# Patient Record
Sex: Male | Born: 1935 | Race: White | Hispanic: No | Marital: Single | Smoking: Former smoker
Health system: Southern US, Community
[De-identification: ages and names within clinical notes are randomized; demographics above are authoritative.]

## PROBLEM LIST (undated history)

## (undated) DIAGNOSIS — C61 Malignant neoplasm of prostate: Secondary | ICD-10-CM

## (undated) DIAGNOSIS — N289 Disorder of kidney and ureter, unspecified: Secondary | ICD-10-CM

## (undated) DIAGNOSIS — I10 Essential (primary) hypertension: Secondary | ICD-10-CM

## (undated) DIAGNOSIS — R55 Syncope and collapse: Secondary | ICD-10-CM

## (undated) DIAGNOSIS — G20 Parkinson's disease: Secondary | ICD-10-CM

## (undated) DIAGNOSIS — F0280 Dementia in other diseases classified elsewhere without behavioral disturbance: Secondary | ICD-10-CM

## (undated) DIAGNOSIS — G20A1 Parkinson's disease without dyskinesia, without mention of fluctuations: Secondary | ICD-10-CM

## (undated) HISTORY — DX: Essential (primary) hypertension: I10

## (undated) HISTORY — DX: Malignant neoplasm of prostate: C61

## (undated) HISTORY — PX: BACK SURGERY: SHX140

---

## 2005-06-11 ENCOUNTER — Ambulatory Visit: Payer: Self-pay | Admitting: Urology

## 2005-06-13 ENCOUNTER — Ambulatory Visit: Payer: Self-pay | Admitting: Urology

## 2005-06-18 ENCOUNTER — Ambulatory Visit: Payer: Self-pay | Admitting: Oncology

## 2005-07-11 ENCOUNTER — Ambulatory Visit: Payer: Self-pay | Admitting: Oncology

## 2005-08-08 ENCOUNTER — Other Ambulatory Visit: Payer: Self-pay

## 2005-08-08 ENCOUNTER — Ambulatory Visit: Payer: Self-pay | Admitting: Urology

## 2005-08-15 ENCOUNTER — Ambulatory Visit: Payer: Self-pay | Admitting: Oncology

## 2005-08-15 ENCOUNTER — Ambulatory Visit: Payer: Self-pay | Admitting: Urology

## 2005-09-10 ENCOUNTER — Ambulatory Visit: Payer: Self-pay | Admitting: Oncology

## 2005-10-11 ENCOUNTER — Ambulatory Visit: Payer: Self-pay | Admitting: Oncology

## 2005-12-20 ENCOUNTER — Ambulatory Visit: Payer: Self-pay | Admitting: Radiation Oncology

## 2006-01-08 ENCOUNTER — Ambulatory Visit: Payer: Self-pay | Admitting: Radiation Oncology

## 2006-06-06 ENCOUNTER — Ambulatory Visit: Payer: Self-pay | Admitting: Radiation Oncology

## 2006-06-10 ENCOUNTER — Ambulatory Visit: Payer: Self-pay | Admitting: Radiation Oncology

## 2007-05-12 ENCOUNTER — Ambulatory Visit: Payer: Self-pay | Admitting: Radiation Oncology

## 2007-05-13 ENCOUNTER — Ambulatory Visit: Payer: Self-pay | Admitting: Radiation Oncology

## 2007-06-11 ENCOUNTER — Ambulatory Visit: Payer: Self-pay | Admitting: Radiation Oncology

## 2008-06-10 ENCOUNTER — Ambulatory Visit: Payer: Self-pay | Admitting: Radiation Oncology

## 2008-06-18 ENCOUNTER — Ambulatory Visit: Payer: Self-pay | Admitting: Radiation Oncology

## 2008-07-11 ENCOUNTER — Ambulatory Visit: Payer: Self-pay | Admitting: Radiation Oncology

## 2009-06-10 ENCOUNTER — Ambulatory Visit: Payer: Self-pay | Admitting: Radiation Oncology

## 2009-06-17 ENCOUNTER — Ambulatory Visit: Payer: Self-pay | Admitting: Radiation Oncology

## 2009-07-11 ENCOUNTER — Ambulatory Visit: Payer: Self-pay | Admitting: Radiation Oncology

## 2010-06-10 ENCOUNTER — Ambulatory Visit: Payer: Self-pay | Admitting: Radiation Oncology

## 2010-06-16 ENCOUNTER — Ambulatory Visit: Payer: Self-pay | Admitting: Radiation Oncology

## 2010-06-17 LAB — PSA

## 2010-07-11 ENCOUNTER — Ambulatory Visit: Payer: Self-pay | Admitting: Radiation Oncology

## 2011-06-15 ENCOUNTER — Ambulatory Visit: Payer: Self-pay | Admitting: Radiation Oncology

## 2011-07-12 ENCOUNTER — Ambulatory Visit: Payer: Self-pay | Admitting: Radiation Oncology

## 2012-02-20 ENCOUNTER — Ambulatory Visit: Payer: Self-pay | Admitting: Gastroenterology

## 2012-06-20 ENCOUNTER — Ambulatory Visit: Payer: Self-pay | Admitting: Radiation Oncology

## 2012-06-23 LAB — PSA: PSA: 0.1 ng/mL (ref 0.0–4.0)

## 2012-07-11 ENCOUNTER — Ambulatory Visit: Payer: Self-pay | Admitting: Radiation Oncology

## 2013-06-25 ENCOUNTER — Ambulatory Visit: Payer: Self-pay | Admitting: Radiation Oncology

## 2013-06-27 LAB — PSA: PSA: 0.1 ng/mL (ref 0.0–4.0)

## 2013-07-11 ENCOUNTER — Ambulatory Visit: Payer: Self-pay | Admitting: Radiation Oncology

## 2014-06-24 ENCOUNTER — Ambulatory Visit: Payer: Self-pay | Admitting: Radiation Oncology

## 2014-06-29 LAB — PSA

## 2014-07-11 ENCOUNTER — Ambulatory Visit: Payer: Self-pay | Admitting: Radiation Oncology

## 2015-07-01 ENCOUNTER — Ambulatory Visit: Payer: Medicare Other | Admitting: Radiation Oncology

## 2015-07-22 ENCOUNTER — Ambulatory Visit
Admission: RE | Admit: 2015-07-22 | Discharge: 2015-07-22 | Disposition: A | Discharge status: Home / Self Care | Payer: Medicare Other | Source: Ambulatory Visit | Attending: Radiation Oncology | Admitting: Radiation Oncology

## 2015-07-22 ENCOUNTER — Encounter: Payer: Self-pay | Admitting: Radiation Oncology

## 2015-07-22 ENCOUNTER — Inpatient Hospital Stay: Payer: Medicare Other | Attending: Radiation Oncology

## 2015-07-22 ENCOUNTER — Other Ambulatory Visit: Payer: Self-pay | Admitting: *Deleted

## 2015-07-22 VITALS — BP 216/81 | HR 57 | Temp 95.9°F | Resp 16 | Ht 75.0 in | Wt 150.4 lb

## 2015-07-22 DIAGNOSIS — C61 Malignant neoplasm of prostate: Secondary | ICD-10-CM

## 2015-07-22 LAB — PSA: PSA: 0.06 ng/mL (ref 0.00–4.00)

## 2015-07-22 NOTE — Progress Notes (Signed)
Radiation Oncology Follow up Note  Name: Jose Baldwin   Date:   07/22/2015 MRN:  [id number] DOB: 1929-05-30    This 81 y.o. male presents to the clinic today for follow-up for prostate cancer now 10 years out  REFERRING PROVIDER: No ref. provider found  HPI: Patient is a 81 year old male now 10 years out having completed IM RT radiation therapy for adenocarcinoma of the prostate. He is seen today in routine follow-up continues to do well. No significant diarrhea dysuria or any other GI/GU complaints. His PSA has remained undetectable..  COMPLICATIONS OF TREATMENT: none  FOLLOW UP COMPLIANCE: keeps appointments   PHYSICAL EXAM:  BP 216/81 mmHg  Pulse 57  Temp(Src) 95.9 F (35.5 C) (Tympanic)  Resp 16  Ht 6\' 3"  (1.905 m)  Wt 150 lb 5.7 oz (68.2 kg)  BMI 18.79 kg/m2 On rectal exam rectal sphincter tone is good. Prostate is smooth contracted without evidence of nodularity or mass. Sulcus is preserved bilaterally. No discrete nodularity is identified. No other rectal abnormalities are noted. Well-developed well-nourished patient in NAD. HEENT reveals PERLA, EOMI, discs not visualized.  Oral cavity is clear. No oral mucosal lesions are identified. Neck is clear without evidence of cervical or supraclavicular adenopathy. Lungs are clear to A&P. Cardiac examination is essentially unremarkable with regular rate and rhythm without murmur rub or thrill. Abdomen is benign with no organomegaly or masses noted. Motor sensory and DTR levels are equal and symmetric in the upper and lower extremities. Cranial nerves II through XII are grossly intact. Proprioception is intact. No peripheral adenopathy or edema is identified. No motor or sensory levels are noted. Crude visual fields are within normal range.  RADIOLOGY RESULTS: No current films for review  PLAN: Present time he is 10 years out with no biochemical chemical  evidence of recurrence. I've run another PSA level on him today and will report  that separately. Otherwise I'm going to discontinue follow-up care at this time. Patient is to call anytime with any concerns.  I would like to take this opportunity for allowing me to participate in the care of your patient.Armstead Peaks., MD

## 2015-07-22 NOTE — Progress Notes (Signed)
Patient here for 10 year follow up. BP elevated advised patient to contact PCP today regarding BP.

## 2017-09-30 ENCOUNTER — Ambulatory Visit (INDEPENDENT_AMBULATORY_CARE_PROVIDER_SITE_OTHER): Payer: Self-pay | Admitting: Orthopaedic Surgery

## 2018-01-13 ENCOUNTER — Ambulatory Visit (INDEPENDENT_AMBULATORY_CARE_PROVIDER_SITE_OTHER): Payer: Medicare Other

## 2018-01-13 ENCOUNTER — Other Ambulatory Visit (INDEPENDENT_AMBULATORY_CARE_PROVIDER_SITE_OTHER): Payer: Self-pay | Admitting: Orthopaedic Surgery

## 2018-01-13 ENCOUNTER — Encounter (INDEPENDENT_AMBULATORY_CARE_PROVIDER_SITE_OTHER): Payer: Self-pay | Admitting: Orthopaedic Surgery

## 2018-01-13 ENCOUNTER — Ambulatory Visit (INDEPENDENT_AMBULATORY_CARE_PROVIDER_SITE_OTHER): Payer: Medicare Other | Admitting: Orthopaedic Surgery

## 2018-01-13 VITALS — BP 139/75 | HR 84 | Resp 20 | Ht 75.0 in | Wt 140.0 lb

## 2018-01-13 DIAGNOSIS — M542 Cervicalgia: Secondary | ICD-10-CM | POA: Diagnosis not present

## 2018-01-13 DIAGNOSIS — G8929 Other chronic pain: Secondary | ICD-10-CM

## 2018-01-13 DIAGNOSIS — M545 Low back pain: Secondary | ICD-10-CM

## 2018-01-13 DIAGNOSIS — M546 Pain in thoracic spine: Secondary | ICD-10-CM

## 2018-01-13 DIAGNOSIS — T148XXA Other injury of unspecified body region, initial encounter: Secondary | ICD-10-CM

## 2018-01-13 NOTE — Progress Notes (Signed)
Office Visit Note   Patient: Jose Baldwin           Date of Birth: [birth year]           MRN: [id number] Visit Date: 01/13/2018              Requested by: No referring provider defined for this encounter. PCP: Sofie Hartigan, MD   Assessment & Plan: Visit Diagnoses:  1. Pain in thoracic spine   2. Neck pain   3. Chronic midline low back pain without sciatica     Plan: Acute on chronic back pain thoracolumbar area.  Could be a compression fracture.  Will order an MRI scan.  Need to consider consultation for recurrent episodes of lightheadedness and dizziness.  Office visit over an hour discussing potential diagnoses and treatment options.  Will order the MRI scan and hopefully achieve this within the next several days.  Consider treatment once we have the results.  No new medicines Follow-Up Instructions: Return after MRI L-S spine.   Orders:  Orders Placed This Encounter  Procedures  . XR Thoracic Spine 2 View  . XR Cervical Spine 2 or 3 views  . XR Lumbar Spine 2-3 Views  . MR Lumbar Spine w/o contrast   No orders of the defined types were placed in this encounter.     Procedures: No procedures performed   Clinical Data: No additional findings.   Subjective: Chief Complaint  Patient presents with  . Middle Back - Pain  . Back Pain    Mid back pain since January, 2019, fell - passed out, surgery 1979, not diabetic  Jose Baldwin is 84 years old accompanied by his son, Jose Baldwin.  He is here for evaluation of problems having with his spine.  He has fallen a number of times over the last number of months.  He gets lightheaded and then "passes out".  Recently in January he fell and is had an exacerbation of her chronic back pain.  It is localized in the area of the thoracolumbar junction.  He has pain when he stands or when he sits for any length of time.  He feels better when he is lying down.  He does not experience any buttock pain or lower extremity discomfort.  He has  had a number of occasions where he feels lightheaded and dizzy particularly when he stands.  He is on blood pressure medicine and notes that his blood pressure appears to be "okay.  Has also experienced some chronic neck pain but without referred pain to either upper extremity.  HPI  Review of Systems  Constitutional: Positive for activity change.  HENT: Negative for trouble swallowing.   Eyes: Negative for pain.  Respiratory: Negative for shortness of breath.   Cardiovascular: Positive for leg swelling.  Gastrointestinal: Negative for constipation.  Endocrine: Negative for cold intolerance.  Genitourinary: Negative for difficulty urinating.  Musculoskeletal: Positive for back pain and gait problem.  Skin: Negative for rash.  Allergic/Immunologic: Negative for food allergies.  Neurological: Positive for dizziness, weakness and light-headedness.  Hematological: Does not bruise/bleed easily.  Psychiatric/Behavioral: Negative for sleep disturbance.     Objective: Vital Signs: BP 139/75   Pulse 84   Resp 20   Ht 6\' 3"  (1.905 m)   Wt 140 lb (63.5 kg)   BMI 17.50 kg/m   Physical Exam  Constitutional: He is oriented to person, place, and time.  Thin, slow gait  HENT:  Mouth/Throat: Oropharynx is clear and moist.  Eyes: Pupils are equal, round, and reactive to light. EOM are normal.  Pulmonary/Chest: Effort normal.  Neurological: He is alert and oriented to person, place, and time.  Skin: Skin is warm and dry.  Psychiatric: He has a normal mood and affect. His behavior is normal.    Ortho Exam awake alert and oriented x3.  Comfortable sitting.  Increased flexion across thoracic spine.  No pain with motion of either upper or lower extremity.  Number of skin abrasions to both upper extremities from recent falls.  No evidence of infection or active bleeding.  Limited range of motion the cervical spine flexion extension but no referred pain.  Able to place both arms overhead slowly.   Has obvious intrinsic atrophy of both hands.  Mild percussible tenderness at the thoracolumbar junction.  Straight leg raise negative.  Mild pitting edema both ankles.  +1 pulses.  Specialty Comments:  No specialty comments available.  Imaging: Xr Thoracic Spine 2 View  Result Date: 01/13/2018 Films of the thoracic spine were obtained in several projections.  There is diffuse calcification of the abdominal aorta.  Some mild compression of the mid thoracic spine appears to be old.  No listhesis..  Diffuse decrease in bony mineralization  Xr Cervical Spine 2 Or 3 Views  Result Date: 01/13/2018 Films of the cervical spine were obtained in 2 projections.  There is diffuse degenerative change throughout the cervical spine.  There is slight anterior listhesis of C6 on C7 degenerative disc disease diffusely between C3-4 C4-5 C5-6.  No obvious fracture.  Xr Lumbar Spine 2-3 Views  Result Date: 01/13/2018 The lumbar spine were obtained in 2 projections.  There is diffuse calcification of the abdominal aorta without obvious.  Widest diameter.  To be 31 mm between T11 and T12.  There is significant compression of T12 whether old or new.  Diffuse degenerative changes of the facet joints at L4-5 L5-S1.  No listhesis.  Mild compression of the superior endplate of L1    PMFS History: There are no active problems to display for this patient.  Past Medical History:  Diagnosis Date  . Hypertension   . Prostate cancer [facility])     History reviewed. No pertinent family history.  Past Surgical History:  Procedure Laterality Date  . BACK SURGERY     Social History   Occupational History  . Not on file  Tobacco Use  . Smoking status: Former Smoker    Packs/day: 2.00    Years: 30.00    Pack years: 60.00    Types: Cigarettes    Last attempt to quit: 1990    Years since quitting: 29.3  . Smokeless tobacco: Never Used  Substance and Sexual Activity  . Alcohol use: Yes    Alcohol/week: 8.4 oz     Types: 14 Cans of beer per week  . Drug use: Never  . Sexual activity: Not on file

## 2018-01-14 ENCOUNTER — Other Ambulatory Visit: Payer: Medicare Other

## 2018-01-17 ENCOUNTER — Ambulatory Visit (INDEPENDENT_AMBULATORY_CARE_PROVIDER_SITE_OTHER): Payer: Medicare Other | Admitting: Orthopaedic Surgery

## 2018-01-18 ENCOUNTER — Other Ambulatory Visit (INDEPENDENT_AMBULATORY_CARE_PROVIDER_SITE_OTHER): Payer: Self-pay | Admitting: Orthopaedic Surgery

## 2018-01-18 ENCOUNTER — Ambulatory Visit
Admission: RE | Admit: 2018-01-18 | Discharge: 2018-01-18 | Disposition: A | Discharge status: Home / Self Care | Payer: Medicare Other | Source: Ambulatory Visit | Attending: Orthopaedic Surgery | Admitting: Orthopaedic Surgery

## 2018-01-18 DIAGNOSIS — G8929 Other chronic pain: Secondary | ICD-10-CM

## 2018-01-18 DIAGNOSIS — M545 Low back pain: Principal | ICD-10-CM

## 2018-01-20 ENCOUNTER — Encounter (INDEPENDENT_AMBULATORY_CARE_PROVIDER_SITE_OTHER): Payer: Self-pay | Admitting: Orthopaedic Surgery

## 2018-01-20 ENCOUNTER — Other Ambulatory Visit (INDEPENDENT_AMBULATORY_CARE_PROVIDER_SITE_OTHER): Payer: Self-pay | Admitting: Radiology

## 2018-01-20 ENCOUNTER — Other Ambulatory Visit (HOSPITAL_COMMUNITY): Payer: Self-pay | Admitting: Interventional Radiology

## 2018-01-20 ENCOUNTER — Ambulatory Visit (INDEPENDENT_AMBULATORY_CARE_PROVIDER_SITE_OTHER): Payer: Medicare Other | Admitting: Orthopaedic Surgery

## 2018-01-20 VITALS — BP 157/84 | HR 71 | Resp 16 | Ht 76.0 in | Wt 140.0 lb

## 2018-01-20 DIAGNOSIS — M545 Low back pain: Secondary | ICD-10-CM | POA: Diagnosis not present

## 2018-01-20 DIAGNOSIS — G8929 Other chronic pain: Secondary | ICD-10-CM

## 2018-01-20 DIAGNOSIS — S22080A Wedge compression fracture of T11-T12 vertebra, initial encounter for closed fracture: Secondary | ICD-10-CM

## 2018-01-20 NOTE — Progress Notes (Addendum)
Office Visit Note   Patient: Jose Baldwin           Date of Birth: [birth year]           MRN: [id number] Visit Date: 01/20/2018              Requested by: Sofie Hartigan, MD [place], [place] [address] PCP: Sofie Hartigan, MD   Assessment & Plan: Visit Diagnoses:  1. Chronic bilateral low back pain without sciatica     Plan: MRI scan demonstrates a 50% compression fracture of T12.  There is edema within the vertebrae consistent with at least an acute or subacute injury.  In addition there are multiple areas of facet arthritis without central stenosis.  There are areas of bilateral foraminal stenosis.  Long discussion with Jose Baldwin and his son, Jose Baldwin.  I think it is worth considering vertebral augmentation.  Might also consider epidural steroid injection.  Majority of his pain is localized to the spine rather than claudication.  Office 1 month Follow-Up Instructions: Return in about 1 month (around 02/20/2018).   Orders:  No orders of the defined types were placed in this encounter.  No orders of the defined types were placed in this encounter.     Procedures: No procedures performed   Clinical Data: No additional findings.   Subjective: Chief Complaint  Patient presents with  . Lower Back - Pain  . Follow-up    MRI REVIEW L SPINE  No change in symptoms  HPI  Review of Systems  Constitutional: Negative for fatigue and fever.  HENT: Negative for ear pain.   Eyes: Negative for pain.  Respiratory: Negative for cough and shortness of breath.   Cardiovascular: Negative for leg swelling.  Gastrointestinal: Negative for constipation and diarrhea.  Genitourinary: Negative for difficulty urinating.  Musculoskeletal: Positive for back pain. Negative for neck pain.  Skin: Negative for rash.  Allergic/Immunologic: Negative for food allergies.  Neurological: Positive for weakness. Negative for numbness.  Hematological: Does not bruise/bleed easily.    Psychiatric/Behavioral: Negative for sleep disturbance.     Objective: Vital Signs: BP (!) 157/84 (BP Location: Right Arm, Patient Position: Sitting, Cuff Size: Normal)   Pulse 71   Resp 16   Ht 6\' 4"  (1.93 m)   Wt 140 lb (63.5 kg)   BMI 17.04 kg/m   Physical Exam awake alert and oriented x3.  Comfortable sitting.  Thin.  Increased thoracic kyphosis.  Skin dry.  Without shortness of breath or chest pain.  Pupils equally round and reactive to light and accommodation.    Ortho Exam some percussible tenderness at the thoracolumbar junction.  Walks without a limp.  No ambulatory aid.  Straight leg raise negative bilaterally.  Specialty Comments:  No specialty comments available.  Imaging: No results found.   PMFS History: There are no active problems to display for this patient.  Past Medical History:  Diagnosis Date  . Hypertension   . Prostate cancer [facility])     History reviewed. No pertinent family history.  Past Surgical History:  Procedure Laterality Date  . BACK SURGERY     Social History   Occupational History  . Not on file  Tobacco Use  . Smoking status: Former Smoker    Packs/day: 2.00    Years: 30.00    Pack years: 60.00    Types: Cigarettes    Last attempt to quit: 1990    Years since quitting: 29.3  . Smokeless tobacco:  Current User    Types: Chew  Substance and Sexual Activity  . Alcohol use: Yes    Alcohol/week: 8.4 oz    Types: 14 Cans of beer per week  . Drug use: Never  . Sexual activity: Not on file

## 2018-01-24 ENCOUNTER — Other Ambulatory Visit: Payer: Self-pay

## 2018-01-24 ENCOUNTER — Emergency Department
Admission: EM | Admit: 2018-01-24 | Discharge: 2018-01-24 | Disposition: A | Discharge status: Home / Self Care | Payer: Medicare Other | Attending: Emergency Medicine | Admitting: Emergency Medicine

## 2018-01-24 ENCOUNTER — Emergency Department: Payer: Medicare Other

## 2018-01-24 DIAGNOSIS — Z7982 Long term (current) use of aspirin: Secondary | ICD-10-CM | POA: Diagnosis not present

## 2018-01-24 DIAGNOSIS — Y93E8 Activity, other personal hygiene: Secondary | ICD-10-CM | POA: Insufficient documentation

## 2018-01-24 DIAGNOSIS — Z79899 Other long term (current) drug therapy: Secondary | ICD-10-CM | POA: Insufficient documentation

## 2018-01-24 DIAGNOSIS — Z87891 Personal history of nicotine dependence: Secondary | ICD-10-CM | POA: Insufficient documentation

## 2018-01-24 DIAGNOSIS — S0285XA Fracture of orbit, unspecified, initial encounter for closed fracture: Secondary | ICD-10-CM

## 2018-01-24 DIAGNOSIS — S0231XA Fracture of orbital floor, right side, initial encounter for closed fracture: Secondary | ICD-10-CM | POA: Diagnosis not present

## 2018-01-24 DIAGNOSIS — R55 Syncope and collapse: Secondary | ICD-10-CM | POA: Diagnosis present

## 2018-01-24 DIAGNOSIS — I10 Essential (primary) hypertension: Secondary | ICD-10-CM | POA: Insufficient documentation

## 2018-01-24 DIAGNOSIS — Y92012 Bathroom of single-family (private) house as the place of occurrence of the external cause: Secondary | ICD-10-CM | POA: Diagnosis not present

## 2018-01-24 DIAGNOSIS — W01198A Fall on same level from slipping, tripping and stumbling with subsequent striking against other object, initial encounter: Secondary | ICD-10-CM | POA: Diagnosis not present

## 2018-01-24 DIAGNOSIS — Y999 Unspecified external cause status: Secondary | ICD-10-CM | POA: Insufficient documentation

## 2018-01-24 LAB — URINALYSIS, COMPLETE (UACMP) WITH MICROSCOPIC
Bacteria, UA: NONE SEEN
Bilirubin Urine: NEGATIVE
GLUCOSE, UA: NEGATIVE mg/dL
Hgb urine dipstick: NEGATIVE
Ketones, ur: 5 mg/dL — AB
LEUKOCYTES UA: NEGATIVE
Nitrite: NEGATIVE
PROTEIN: 30 mg/dL — AB
SPECIFIC GRAVITY, URINE: 1.023 (ref 1.005–1.030)
SQUAMOUS EPITHELIAL / LPF: NONE SEEN (ref 0–5)
pH: 5 (ref 5.0–8.0)

## 2018-01-24 LAB — BASIC METABOLIC PANEL
Anion gap: 11 (ref 5–15)
BUN: 23 mg/dL — AB (ref 6–20)
CALCIUM: 9.8 mg/dL (ref 8.9–10.3)
CO2: 25 mmol/L (ref 22–32)
Chloride: 100 mmol/L — ABNORMAL LOW (ref 101–111)
Creatinine, Ser: 1.33 mg/dL — ABNORMAL HIGH (ref 0.61–1.24)
GFR calc Af Amer: 54 mL/min — ABNORMAL LOW (ref >60)
GFR calc non Af Amer: 46 mL/min — ABNORMAL LOW (ref >60)
Glucose, Bld: 116 mg/dL — ABNORMAL HIGH (ref 65–99)
Potassium: 4.3 mmol/L (ref 3.5–5.1)
SODIUM: 136 mmol/L (ref 135–145)

## 2018-01-24 LAB — CBC
HCT: 36.9 % — ABNORMAL LOW (ref 40.0–52.0)
Hemoglobin: 12.6 g/dL — ABNORMAL LOW (ref 13.0–18.0)
MCH: 31.8 pg (ref 26.0–34.0)
MCHC: 34.2 g/dL (ref 32.0–36.0)
MCV: 93 fL (ref 80.0–100.0)
PLATELETS: 182 10*3/uL (ref 150–440)
RBC: 3.97 MIL/uL — ABNORMAL LOW (ref 4.40–5.90)
RDW: 13.6 % (ref 11.5–14.5)
WBC: 9.6 10*3/uL (ref 3.8–10.6)

## 2018-01-24 LAB — TROPONIN I: Troponin I: 0.04 ng/mL (ref <0.03)

## 2018-01-24 MED ORDER — AMOXICILLIN-POT CLAVULANATE 875-125 MG PO TABS: 1.0000 | ORAL_TABLET | Freq: Two times a day (BID) | ORAL | 0 refills | Status: DC

## 2018-01-24 MED ORDER — SODIUM CHLORIDE 0.9 % IV BOLUS: 500.0000 mL | Freq: Once | INTRAVENOUS | Status: DC

## 2018-01-24 MED ORDER — AMOXICILLIN-POT CLAVULANATE 875-125 MG PO TABS: 1.0000 | ORAL_TABLET | Freq: Two times a day (BID) | ORAL | 0 refills | Status: AC

## 2018-01-24 NOTE — ED Notes (Signed)
Patient and his son were informed that patient was the next one to get a bed depending on availability. Patient and his son were very calm and agreeable. Bleeding is controlled on laceration on patient's face.

## 2018-01-24 NOTE — ED Provider Notes (Signed)
-----------------------------------------   10:11 PM on 01/24/2018 -----------------------------------------  Patient's troponin slightly elevated 0.04 likely due to mild renal insufficiency.  I discussed the results with the patient and his son.  They state the syncopal episodes have been an ongoing issue since January, they have reduce the patient's blood pressure medications he is seeing his primary care doctor for the same.  Patient strongly wishes to go home, son is agreeable to this as well.  I discussed with the patient the need to increase his fluids throughout the day, stand up slowly, and get up very slowly from seated or lying positions.   Harvest Dark, MD 01/24/18 2212

## 2018-01-24 NOTE — ED Notes (Signed)
ED Provider at bedside. 

## 2018-01-24 NOTE — ED Provider Notes (Signed)
[facility] Emergency Department Provider Note ____________________________________________   First MD Initiated Contact with Patient 01/24/18 1855     (approximate)  I have reviewed the triage vital signs and the nursing notes.   HISTORY  Chief Complaint Loss of Consciousness    HPI Jose Baldwin is a 84 y.o. male with PMH as noted below presents with syncope, acute onset today while he was shaving and getting himself ready in the bathroom, proceeded by lightheadedness for several seconds, and associated with a fall with resulting head injury.  The patient states that he has had similar syncope with falls since January, however they have been becoming more frequent.  He fell 4 days ago, and again 2 days ago in a similar manner.  He reports pain and a skin tear to his right elbow, pain and swelling to his face, but no other injuries.  He denies chest pain or difficulty breathing.  Patient's son states that his doctors have been gradually decreasing the dose of his antihypertensives, but that his blood pressure remains low a lot of the time.  He often has lightheadedness even while sitting or at rest.  Past Medical History:  Diagnosis Date  . Hypertension   . Prostate cancer ([place])     There are no active problems to display for this patient.   Past Surgical History:  Procedure Laterality Date  . BACK SURGERY      Prior to Admission medications   Medication Sig Start Date End Date Taking? Authorizing Provider  amLODipine (NORVASC) 5 MG tablet TAKE 1/2 TABLET BY MOUTH ONCE DAILY 04/05/15   [provider]  aspirin EC 81 MG tablet Take by mouth.    [provider]  metoprolol tartrate (LOPRESSOR) 25 MG tablet TAKE 1/2 TABLET TWICE DAILY 04/05/15   [provider]  Multiple Vitamin (MULTI-VITAMINS) TABS Take by mouth.    [provider]  ramipril (ALTACE) 5 MG capsule TAKE ONE CAPSULE EVERY DAY 04/05/15   [provider]  ranibizumab (LUCENTIS) 0.5 MG/0.05ML SOLN by Intravitreal route.    [provider]  simvastatin (ZOCOR) 20 MG tablet TAKE ONE TABLET EVERY DAY 04/05/15   [provider]  simvastatin (ZOCOR) 20 MG tablet Take 20 mg by mouth daily. 07/04/15   [provider]    Allergies Patient has no known allergies.  History reviewed. No pertinent family history.  Social History Social History   Tobacco Use  . Smoking status: Former Smoker    Packs/day: 2.00    Years: 30.00    Pack years: 60.00    Types: Cigarettes    Last attempt to quit: 1990    Years since quitting: 29.3  . Smokeless tobacco: Current User    Types: Chew  Substance Use Topics  . Alcohol use: Yes    Alcohol/week: 8.4 oz    Types: 14 Cans of beer per week  . Drug use: Never    Review of Systems  Constitutional: No fever. Eyes: No redness. ENT: No neck pain. Cardiovascular: Denies chest pain. Respiratory: Denies shortness of breath. Gastrointestinal: No nausea or vomiting.  Genitourinary: Negative for dysuria or flank pain.  Musculoskeletal: Negative for back pain. Skin: Negative for rash. Neurological: Negative for headache.   ____________________________________________   PHYSICAL EXAM:  VITAL SIGNS: ED Triage Vitals  Enc Vitals Group     BP 01/24/18 1347 (!) 158/69     Pulse Rate 01/24/18 1347 80     Resp 01/24/18 1347  18     Temp 01/24/18 1347 98.3 F (36.8 C)     Temp Source 01/24/18 1347 Oral     SpO2 01/24/18 1347 100 %     Weight 01/24/18 1352 140 lb (63.5 kg)     Height 01/24/18 1352 6\' 3"  (1.905 m)     Head Circumference --      Peak Flow --      Pain Score 01/24/18 1419 0     Pain Loc --      Pain Edu? --      Excl. in [place]? --     Constitutional: Alert and oriented.  Relatively well appearing for age and in no acute distress. Eyes: Conjunctivae are normal.  EOMI.  PERRLA. Head: Ecchymosis below right eye.  No significant periorbital or  maxillary swelling or tenderness.  Nose: No congestion/rhinnorhea. Mouth/Throat: Mucous membranes are slightly dry.   Neck: Normal range of motion.  No midline cervical spinal tenderness. Cardiovascular: Normal rate, regular rhythm. Grossly normal heart sounds.  Good peripheral circulation. Respiratory: Normal respiratory effort.  No retractions. Lungs CTAB. Gastrointestinal: Soft and nontender. No distention.  Genitourinary: No flank tenderness. Musculoskeletal: No lower extremity edema.  Extremities warm and well perfused.  FROM right elbow Neurologic:  Normal speech and language.  Motor and sensory intact in all extremities.  Normal coordination.  No gross focal neurologic deficits are appreciated.  Skin:  Skin is warm and dry. No rash noted. Psychiatric: Mood and affect are normal. Speech and behavior are normal.  ____________________________________________   LABS (all labs ordered are listed, but only abnormal results are displayed)  Labs Reviewed  BASIC METABOLIC PANEL - Abnormal; Notable for the following components:      Result Value   Chloride 100 (*)    Glucose, Bld 116 (*)    BUN 23 (*)    Creatinine, Ser 1.33 (*)    GFR calc non Af Amer 46 (*)    GFR calc Af Amer 54 (*)    All other components within normal limits  CBC - Abnormal; Notable for the following components:   RBC 3.97 (*)    Hemoglobin 12.6 (*)    HCT 36.9 (*)    All other components within normal limits  URINALYSIS, COMPLETE (UACMP) WITH MICROSCOPIC - Abnormal; Notable for the following components:   Color, Urine AMBER (*)    APPearance HAZY (*)    Ketones, ur 5 (*)    Protein, ur 30 (*)    All other components within normal limits  TROPONIN I  CBG MONITORING, ED   ____________________________________________  EKG  ED ECG REPORT I, Arta Silence, the attending physician, personally viewed and interpreted this ECG.  Date: 01/24/2018 EKG Time: 1357 Rate: 84 Rhythm: normal sinus  rhythm QRS Axis: Left axis Intervals: LBBB ST/T Wave abnormalities: normal Narrative Interpretation: no evidence of acute ischemia; no recent prior EKG available for comparison  ____________________________________________  RADIOLOGY  CT head: Bilateral orbital floor and posterior maxillary sinus fractures.  ____________________________________________   PROCEDURES  Procedure(s) performed: No  Procedures  Critical Care performed: No ____________________________________________   INITIAL IMPRESSION / ASSESSMENT AND PLAN / ED COURSE  Pertinent labs & imaging results that were available during my care of the patient were reviewed by me and considered in my medical decision making (see chart for details).  84 year old male with PMH as noted above presents with recurrent syncope and resulting head and facial injuries, as well as skin tear to right elbow.  Per  son, the episodes of becoming have been more frequent recently with 2 prior syncopal or near syncopal episodes with resulting falls this week.  Past medical records reviewed in Epic and are otherwise noncontributory.  Exam is as described above.  Patient's blood pressure and other vital signs are normal.  He has ecchymosis below his right eye, and skin tear to the right elbow which is been dressed, but no bony tenderness or deformity to the extremities.  Neuro exam is nonfocal.   Overall most likely culprit of patient's recurrent syncope and/or near syncope is hypotension related to his antihypertensives, however given his normal vital signs at this time, differential also includes dehydration, other metabolic etiology, infection such as UTI, or cardiac cause.  CT head obtained from triage shows orbital floor and maxillary sinus fractures, however these may not all be from today's fall.  Patient has no diplopia or vision changes.  Extraocular movements are intact, and there is no clinical evidence of entrapment.  Initial basic  labs are in normal limits for patient of this age.  Plan: UA, troponin, and reassess.   ----------------------------------------- 8:43 PM on 01/24/2018 -----------------------------------------  Patient is still pending UA and troponin which have now been collected.  I had an extensive discussion with the patient and his son about the plan of care.  Given his recurrent syncope and the fact that he lives alone, I offered the patient admission.  However the patient is adamant that he would strongly prefer to go home unless his results are totally abnormal.  Given that his vital signs have remained stable here, and his initial labs are unremarkable, I feel that this would be reasonable if his troponin is negative.  Based on discussion with the patient and his son, we have agreed on the following plan: If UA is consistent with UTI, we will treat with antibiotics as an outpatient.  If his troponin is abnormal, we will admit.  The patient's son requested a referral to Dr. Percival Spanish from cardiology, which we will provide.  I will send a message in [place].  We will also give a referral to ophthalmology.  I am signing the patient out to the oncoming physician Dr. Kerman Passey.    ____________________________________________   FINAL CLINICAL IMPRESSION(S) / ED DIAGNOSES  Final diagnoses:  Syncope, unspecified syncope type  Closed fracture of orbit, initial encounter ([place])      NEW MEDICATIONS STARTED DURING THIS VISIT:  New Prescriptions   No medications on file     Note:  This document was prepared using Dragon voice recognition software and may include unintentional dictation errors.     Arta Silence, MD 01/24/18 2057

## 2018-01-24 NOTE — Discharge Instructions (Signed)
Follow-up with the cardiologist within the next 1 to 2 weeks.  You should also follow-up with the eye doctor (a referral has also been provided).  Return to the ER for new, worsening, persistent weakness, lightheadedness, passing out, chest pain, difficulty breathing, severe headache, or any other new or worsening symptoms that concern you.

## 2018-01-24 NOTE — ED Notes (Signed)
Delay in care apologies to pt and family, unable to round and conduct orders d/t pt load

## 2018-01-24 NOTE — ED Notes (Signed)
Cleaned pt face and neck with warm wipes.

## 2018-01-24 NOTE — ED Notes (Signed)
CRITICAL LAB: TROPONIN is 0.04, [facility], Dr. Kerman Passey notified, orders received

## 2018-01-24 NOTE — ED Triage Notes (Signed)
Pt arrives to ED after a fall in bedroom. Hit head. Small cut noted above R eye. Blood covering face and clothes. Multiple skin tears noted to R elbow. Dressed in triage with gauze and coban. Denies blood thinners. After hitting bed landed on tile floor. Positive LOC. Alert and oriented at this time. In wheelchair.   Fell Monday and Wednesday as well. Son states R black eye is from fall earlier this week.

## 2018-01-27 ENCOUNTER — Telehealth: Payer: Self-pay | Admitting: *Deleted

## 2018-01-27 NOTE — Telephone Encounter (Signed)
Call and  spoke with pt appt made for 05/31 @ 3:40 pm

## 2018-01-28 ENCOUNTER — Ambulatory Visit (HOSPITAL_COMMUNITY): Admission: RE | Admit: 2018-01-28 | Payer: Medicare Other | Source: Ambulatory Visit

## 2018-01-28 ENCOUNTER — Other Ambulatory Visit (HOSPITAL_COMMUNITY): Payer: Self-pay | Admitting: Interventional Radiology

## 2018-01-28 DIAGNOSIS — M545 Low back pain, unspecified: Secondary | ICD-10-CM

## 2018-01-30 ENCOUNTER — Other Ambulatory Visit: Payer: Self-pay | Admitting: Student

## 2018-01-31 ENCOUNTER — Other Ambulatory Visit (HOSPITAL_COMMUNITY): Payer: Self-pay | Admitting: Interventional Radiology

## 2018-01-31 ENCOUNTER — Ambulatory Visit (HOSPITAL_COMMUNITY)
Admission: RE | Admit: 2018-01-31 | Discharge: 2018-01-31 | Disposition: A | Discharge status: Home / Self Care | Payer: Medicare Other | Source: Ambulatory Visit | Attending: Interventional Radiology | Admitting: Interventional Radiology

## 2018-01-31 DIAGNOSIS — Z8546 Personal history of malignant neoplasm of prostate: Secondary | ICD-10-CM | POA: Insufficient documentation

## 2018-01-31 DIAGNOSIS — Z7982 Long term (current) use of aspirin: Secondary | ICD-10-CM

## 2018-01-31 DIAGNOSIS — M545 Low back pain, unspecified: Secondary | ICD-10-CM

## 2018-01-31 DIAGNOSIS — R55 Syncope and collapse: Secondary | ICD-10-CM | POA: Diagnosis not present

## 2018-01-31 DIAGNOSIS — X58XXXA Exposure to other specified factors, initial encounter: Secondary | ICD-10-CM | POA: Insufficient documentation

## 2018-01-31 DIAGNOSIS — Z9889 Other specified postprocedural states: Secondary | ICD-10-CM

## 2018-01-31 DIAGNOSIS — M4854XA Collapsed vertebra, not elsewhere classified, thoracic region, initial encounter for fracture: Secondary | ICD-10-CM

## 2018-01-31 DIAGNOSIS — I10 Essential (primary) hypertension: Secondary | ICD-10-CM

## 2018-01-31 DIAGNOSIS — D166 Benign neoplasm of vertebral column: Secondary | ICD-10-CM | POA: Insufficient documentation

## 2018-01-31 DIAGNOSIS — Z79899 Other long term (current) drug therapy: Secondary | ICD-10-CM

## 2018-01-31 DIAGNOSIS — Z87891 Personal history of nicotine dependence: Secondary | ICD-10-CM | POA: Insufficient documentation

## 2018-01-31 DIAGNOSIS — I951 Orthostatic hypotension: Secondary | ICD-10-CM | POA: Diagnosis not present

## 2018-01-31 HISTORY — PX: IR VERTEBROPLASTY CERV/THOR BX INC UNI/BIL INC/INJECT/IMAGING: IMG5515

## 2018-01-31 LAB — CBC
HEMATOCRIT: 27.3 % — AB (ref 39.0–52.0)
Hemoglobin: 9.1 g/dL — ABNORMAL LOW (ref 13.0–17.0)
MCH: 30.6 pg (ref 26.0–34.0)
MCHC: 33.3 g/dL (ref 30.0–36.0)
MCV: 91.9 fL (ref 78.0–100.0)
PLATELETS: 214 10*3/uL (ref 150–400)
RBC: 2.97 MIL/uL — ABNORMAL LOW (ref 4.22–5.81)
RDW: 13.4 % (ref 11.5–15.5)
WBC: 6.8 10*3/uL (ref 4.0–10.5)

## 2018-01-31 LAB — PROTIME-INR
INR: 1
PROTHROMBIN TIME: 13.1 s (ref 11.4–15.2)

## 2018-01-31 LAB — APTT: aPTT: 26 seconds (ref 24–36)

## 2018-01-31 MED ORDER — SODIUM CHLORIDE 0.9 % IV SOLN: INTRAVENOUS | Status: DC

## 2018-01-31 MED ORDER — FENTANYL CITRATE (PF) 100 MCG/2ML IJ SOLN
INTRAMUSCULAR | Status: AC | PRN
  Administered 2018-01-31 (×2): 25 ug via INTRAVENOUS

## 2018-01-31 MED ORDER — CEFAZOLIN SODIUM-DEXTROSE 2-4 GM/100ML-% IV SOLN
INTRAVENOUS | Status: AC
  Filled 2018-01-31: qty 100

## 2018-01-31 MED ORDER — IOPAMIDOL (ISOVUE-300) INJECTION 61%
INTRAVENOUS | Status: AC
  Administered 2018-01-31: 5 mL
  Filled 2018-01-31: qty 50

## 2018-01-31 MED ORDER — SODIUM CHLORIDE 0.9 % IV SOLN: INTRAVENOUS | Status: AC

## 2018-01-31 MED ORDER — FENTANYL CITRATE (PF) 100 MCG/2ML IJ SOLN
INTRAMUSCULAR | Status: AC
  Filled 2018-01-31: qty 4

## 2018-01-31 MED ORDER — MIDAZOLAM HCL 2 MG/2ML IJ SOLN
INTRAMUSCULAR | Status: AC
  Filled 2018-01-31: qty 4

## 2018-01-31 MED ORDER — BUPIVACAINE HCL (PF) 0.5 % IJ SOLN
INTRAMUSCULAR | Status: AC | PRN
  Administered 2018-01-31: 18 mL

## 2018-01-31 MED ORDER — BUPIVACAINE HCL (PF) 0.5 % IJ SOLN
INTRAMUSCULAR | Status: AC
  Filled 2018-01-31: qty 30

## 2018-01-31 MED ORDER — CEFAZOLIN SODIUM-DEXTROSE 2-4 GM/100ML-% IV SOLN
2.0000 g | Freq: Once | INTRAVENOUS | Status: AC
  Administered 2018-01-31: 2 g via INTRAVENOUS

## 2018-01-31 MED ORDER — TOBRAMYCIN SULFATE 1.2 G IJ SOLR
INTRAMUSCULAR | Status: AC | PRN
  Administered 2018-01-31: .2 g

## 2018-01-31 MED ORDER — MIDAZOLAM HCL 2 MG/2ML IJ SOLN
INTRAMUSCULAR | Status: AC | PRN
  Administered 2018-01-31 (×2): 1 mg via INTRAVENOUS

## 2018-01-31 MED ORDER — TOBRAMYCIN SULFATE 1.2 G IJ SOLR
INTRAMUSCULAR | Status: AC
  Filled 2018-01-31: qty 1.2

## 2018-01-31 NOTE — H&P (Signed)
Chief Complaint: Patient was seen in consultation today for thoracic 12 compression fracture.  Referring Physician(s): Joni Fears  Supervising Physician: Luanne Bras  Patient Status: [facility] - Out-pt  History of Present Illness: Jose Baldwin is a 84 y.o. male with a past medical history of hypertension and prostate cancer. He fell in January 2019, and since has had worsening back pain. He saw Dr. Durward Fortes for this problem, who ordered an MRI of lumbar spine.  MRI lumbar spine 01/18/2018: 1. T12 vertebral body compression fracture with approximately 50% height loss with methylmethacrylate within the vertebral body from prior augmentation. Persistent marrow edema throughout the T12 vertebral body. 3 mm retropulsion of superior posterior margin of the T12 vertebral body mildly impressing on the thecal sac. 2. Lumbar spine spondylosis as described above.  IR requested by Dr. Durward Fortes for possible image-guided thoracic 12 kyphoplasty/vertebroplasty with thoracic 12 bone biopsy. Patient awake and alert laying in bed. Accompanied by 2 sons at bedside. Complains of constant midline back pain, rated 8/10. States pain is worse with movement and better when laying flat. Denies fever, numbness/tingling down legs, or bladder/bowel incontinence.  Past Medical History:  Diagnosis Date  . Hypertension   . Prostate cancer [facility])     Past Surgical History:  Procedure Laterality Date  . BACK SURGERY      Allergies: Patient has no known allergies.  Medications: Prior to Admission medications   Medication Sig Start Date End Date Taking? Authorizing Provider  amLODipine (NORVASC) 5 MG tablet Take 2.5 mg by mouth daily.    [provider]  amoxicillin-clavulanate (AUGMENTIN) 875-125 MG tablet Take 1 tablet by mouth 2 (two) times daily for 7 days. 01/24/18 01/31/18  Harvest Dark, MD  aspirin EC 81 MG tablet Take 81 mg by mouth daily.     [provider]    metoprolol tartrate (LOPRESSOR) 25 MG tablet Take 12.5 mg by mouth 2 (two) times daily.    [provider]  Multiple Vitamin (MULTI-VITAMINS) TABS Take 1 tablet by mouth daily.     [provider]  ramipril (ALTACE) 5 MG capsule TAKE ONE CAPSULE EVERY DAY 04/05/15   [provider]  ranibizumab (LUCENTIS) 0.5 MG/0.05ML SOLN 0.5 mg by Intravitreal route.     [provider]  simvastatin (ZOCOR) 20 MG tablet Take 20 mg by mouth daily. 07/04/15   [provider]     No family history on file.  Social History   Socioeconomic History  . Marital status: Single    Spouse name: Not on file  . Number of children: Not on file  . Years of education: Not on file  . Highest education level: Not on file  Occupational History  . Not on file  Social Needs  . Financial resource strain: Not on file  . Food insecurity:    Worry: Not on file    Inability: Not on file  . Transportation needs:    Medical: Not on file    Non-medical: Not on file  Tobacco Use  . Smoking status: Former Smoker    Packs/day: 2.00    Years: 30.00    Pack years: 60.00    Types: Cigarettes    Last attempt to quit: 1990    Years since quitting: 29.4  . Smokeless tobacco: Current User    Types: Chew  Substance and Sexual Activity  . Alcohol use: Yes    Alcohol/week: 8.4 oz    Types: 14 Cans of beer per week  .  Drug use: Never  . Sexual activity: Not on file  Lifestyle  . Physical activity:    Days per week: Not on file    Minutes per session: Not on file  . Stress: Not on file  Relationships  . Social connections:    Talks on phone: Not on file    Gets together: Not on file    Attends religious service: Not on file    Active member of club or organization: Not on file    Attends meetings of clubs or organizations: Not on file    Relationship status: Not on file  Other Topics Concern  . Not on file  Social History Narrative  . Not on file     Review of  Systems: A 12 point ROS discussed and pertinent positives are indicated in the HPI above.  All other systems are negative.  Review of Systems  Constitutional: Negative for activity change and fever.  Respiratory: Negative for shortness of breath and wheezing.   Cardiovascular: Negative for chest pain and palpitations.  Gastrointestinal:       Negative for bowel incontinence.  Genitourinary:       Negative for bladder incontinence.  Musculoskeletal: Positive for back pain.  Neurological: Negative for numbness.  Psychiatric/Behavioral: Negative for behavioral problems and confusion.    Vital Signs: BP (!) 163/61   Pulse (!) 57   Temp 98.4 F (36.9 C)   Resp 16   Ht 6\' 1"  (1.854 m)   Wt 140 lb (63.5 kg)   SpO2 100%   BMI 18.47 kg/m   Physical Exam  Constitutional: He is oriented to person, place, and time. He appears well-developed and well-nourished. No distress.  Cardiovascular: Normal rate, regular rhythm, normal heart sounds and intact distal pulses.  No murmur heard. Pulmonary/Chest: Effort normal and breath sounds normal. No respiratory distress. He has no wheezes.  Musculoskeletal:  Moderate tenderness of midline back at approximate level of thoracic 12.  Neurological: He is alert and oriented to person, place, and time.  Skin: Skin is warm and dry.  Psychiatric: He has a normal mood and affect. His behavior is normal. Judgment and thought content normal.  Nursing note and vitals reviewed.    MD Evaluation Airway: WNL Heart: WNL Abdomen: WNL Chest/ Lungs: WNL ASA  Classification: 3 Mallampati/Airway Score: One   Imaging: Ct Head Wo Contrast  Result Date: 01/24/2018 CLINICAL DATA:  Head trauma status post fall EXAM: CT HEAD WITHOUT CONTRAST TECHNIQUE: Contiguous axial images were obtained from the base of the skull through the vertex without intravenous contrast. COMPARISON:  None. FINDINGS: Brain: No evidence of acute infarction, hemorrhage, extra-axial  collection, ventriculomegaly, or mass effect. 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 Generalized cerebral atrophy. Periventricular white matter low attenuation likely secondary to microangiopathy. Vascular: Cerebrovascular atherosclerotic calcifications are noted. Skull: Negative for calvarial fracture or focal lesion. Comminuted right orbital floor fracture. Nondisplaced fracture of right posterolateral wall of the maxillary sinus. Nondisplaced fracture of the anterior left orbital floor. Possible nondisplaced fracture of the left posterolateral wall of maxillary sinus. Sinuses/Orbits: Visualized portions of the orbits are unremarkable. Visualized portions of the paranasal sinuses and mastoid air cells are unremarkable. Other: None. IMPRESSION: 1. No acute intracranial pathology. 2. Comminuted right orbital floor fracture. Nondisplaced fracture of right posterolateral wall of the maxillary sinus. Nondisplaced fracture of the anterior left orbital floor. Possible nondisplaced fracture of the left posterolateral wall of maxillary sinus. Electronically Signed   By: Kathreen Devoid   On:  01/24/2018 14:56   Mr Lumbar Spine Wo Contrast  Result Date: 01/18/2018 CLINICAL DATA:  Low back pain since January 2019. History of multiple falls. History of prostate cancer. EXAM: MRI LUMBAR SPINE WITHOUT CONTRAST TECHNIQUE: Multiplanar, multisequence MR imaging of the lumbar spine was performed. No intravenous contrast was administered. COMPARISON:  None. FINDINGS: Segmentation:  Standard. Alignment:  Physiologic. Vertebrae: No discitis or osteomyelitis. No aggressive osseous lesion. T12 vertebral body compression fracture with approximately 50% height loss with methylmethacrylate within the vertebral body from prior augmentation. Persistent marrow edema throughout the T12 vertebral body. 3 mm retropulsion of superior posterior margin of the T12 vertebral body mildly impressing on the thecal sac. Chronic L1 vertebral body  compression fracture. Conus medullaris and cauda equina: Conus extends to the T12 level. Conus and cauda equina appear normal. Paraspinal and other soft tissues: No acute paraspinal abnormality. Disc levels: Disc spaces: Degenerative disc disease with disc desiccation and disc height loss at L5-S1. Disc desiccation throughout the lumbar spine. T11-12: Mild bilateral facet arthropathy. T12-L1: Minimal broad-based disc bulge. Mild bilateral facet arthropathy. No evidence of neural foraminal stenosis. No central canal stenosis. L1-L2: Mild broad-based disc bulge. Mild bilateral facet arthropathy. No evidence of neural foraminal stenosis. No central canal stenosis. L2-L3: Broad-based disc bulge. Mild bilateral facet arthropathy. Bilateral lateral recess narrowing. No evidence of neural foraminal stenosis. No central canal stenosis. L3-L4: Broad-based disc bulge flattening the ventral thecal sac. Mild bilateral facet arthropathy. Bilateral lateral recess stenosis. No evidence of neural foraminal stenosis. No central canal stenosis. L4-L5: Broad-based disc bulge. Moderate bilateral facet arthropathy. Mild spinal stenosis and bilateral lateral recess stenosis. Severe bilateral foraminal stenosis. L5-S1: Mild broad-based disc bulge. Mild bilateral facet arthropathy. Mild bilateral foraminal stenosis. No central canal stenosis. IMPRESSION: 1. T12 vertebral body compression fracture with approximately 50% height loss with methylmethacrylate within the vertebral body from prior augmentation. Persistent marrow edema throughout the T12 vertebral body. 3 mm retropulsion of superior posterior margin of the T12 vertebral body mildly impressing on the thecal sac. 2. Lumbar spine spondylosis as described above. Electronically Signed   By: Kathreen Devoid   On: 01/18/2018 12:21   Xr Thoracic Spine 2 View  Result Date: 01/13/2018 Films of the thoracic spine were obtained in several projections.  There is diffuse calcification of the  abdominal aorta.  Some mild compression of the mid thoracic spine appears to be old.  No listhesis..  Diffuse decrease in bony mineralization  Xr Cervical Spine 2 Or 3 Views  Result Date: 01/13/2018 Films of the cervical spine were obtained in 2 projections.  There is diffuse degenerative change throughout the cervical spine.  There is slight anterior listhesis of C6 on C7 degenerative disc disease diffusely between C3-4 C4-5 C5-6.  No obvious fracture.  Xr Lumbar Spine 2-3 Views  Result Date: 01/13/2018 The lumbar spine were obtained in 2 projections.  There is diffuse calcification of the abdominal aorta without obvious.  Widest diameter.  To be 31 mm between T11 and T12.  There is significant compression of T12 whether old or new.  Diffuse degenerative changes of the facet joints at L4-5 L5-S1.  No listhesis.  Mild compression of the superior endplate of L1   Labs:  CBC: Recent Labs    01/24/18 1413  WBC 9.6  HGB 12.6*  HCT 36.9*  PLT 182    COAGS: No results for input(s): INR, APTT in the last 8760 hours.  BMP: Recent Labs    01/24/18 1413  NA 136  K 4.3  CL 100*  CO2 25  GLUCOSE 116*  BUN 23*  CALCIUM 9.8  CREATININE 1.33*  GFRNONAA 46*  GFRAA 54*    LIVER FUNCTION TESTS: No results for input(s): BILITOT, AST, ALT, ALKPHOS, PROT, ALBUMIN in the last 8760 hours.  TUMOR MARKERS: No results for input(s): AFPTM, CEA, CA199, CHROMGRNA in the last 8760 hours.  Assessment and Plan:  Thoracic 12 compression fracture. Plan for image-guided thoracic 12 kyphoplasty/vertebroplasty with thoracic 12 bone biopsy today with Dr. Estanislado Pandy. Patient is NPO. Denies fever and WBCs WNL. He does not take blood thinners. INR pending.  Risks and benefits of thoracic 12 kyphoplasty/vertebroplasty were discussed with the patient including, but not limited to education regarding the natural healing process of compression fractures without intervention, bleeding, infection, cement  migration which may cause spinal cord damage, paralysis, pulmonary embolism or even death. This interventional procedure involves the use of X-rays and because of the nature of the planned procedure, it is possible that we will have prolonged use of X-ray fluoroscopy. Potential radiation risks to you include (but are not limited to) the following: - A slightly elevated risk for cancer  several years later in life. This risk is typically less than 0.5% percent. This risk is low in comparison to the normal incidence of human cancer, which is 33% for women and 50% for men according to the [employer]. - Radiation induced injury can include skin redness, resembling a rash, tissue breakdown / ulcers and hair loss (which can be temporary or permanent).  The likelihood of either of these occurring depends on the difficulty of the procedure and whether you are sensitive to radiation due to previous procedures, disease, or genetic conditions.  IF your procedure requires a prolonged use of radiation, you will be notified and given written instructions for further action.  It is your responsibility to monitor the irradiated area for the 2 weeks following the procedure and to notify your physician if you are concerned that you have suffered a radiation induced injury.   All of the patient's questions were answered, patient is agreeable to proceed. Consent signed and in chart.  Thank you for this interesting consult.  I greatly enjoyed meeting Jose Baldwin and look forward to participating in their care.  A copy of this report was sent to the requesting provider on this date.  Electronically Signed: Earley Abide, PA-C 01/31/2018, 8:57 AM   I spent a total of 30 Minutes in face to face in clinical consultation, greater than 50% of which was counseling/coordinating care for thoracic 12 compression fracture.

## 2018-01-31 NOTE — Sedation Documentation (Signed)
Pt has the hiccups.

## 2018-01-31 NOTE — Discharge Instructions (Signed)
**Note -Identified via Obfuscation** Percutaneous Vertebroplasty Percutaneous (through the skin) vertebroplasty is a procedure used to treat collapsed bones (compression fractures) of the spine. Spine (vertebral) fractures can be painful and limit movement. Percutaneous vertebroplasty stabilizes the fracture by injecting bone cement into the collapsed bone. This restores the vertebra and helps prevent further collapse. Tell a health care provider about: Any allergies you have. All medicines you are taking, including vitamins, herbs, eye drops, creams, and over-the-counter medicines. Any problems you or family members have had with anesthetic medicines. Any blood disorders you have. Any surgeries you have had. What are the risks? Generally, this is a safe procedure. However, as with any procedure, complications can occur. Possible complications include: Bone cement leakage. Nerve damage. Infection. Need for another surgery. Paralysis (very rare). Some people are at higher risk than others for this complication to occur. Your particular risks should be discussed with your health care provider.  What happens before the procedure? Your health care provider may want you to have blood tests. These tests can help tell how well your kidneys and liver are working. They can also show how well your blood clots. You may be asked to stop using medicines that make it hard for your blood to clot. These can include blood thinners, aspirin, and nonsteroidal anti-inflammatory drugs (NSAIDs) like ibuprofen and naproxen. You may also need to stop taking vitamin E. Your health care provider will tell you when to stop taking these medicines, and when it is safe to start taking them again. You may need to take medicine to help make your bones stronger. This will help prepare you for the procedure. Do not eat or drink for 8 hours before your procedure or as told by your health care provider. You might be asked to shower or wash at home with a soap that  kills skin bacteria. Make arrangements for someone to drive you home and stay with you for 24 hours. What happens during the procedure? An IV tube will be inserted into one of your veins. Medicine to help you relax (sedative) will be given through the IV tube. You will lie face down for the procedure. Medicine that numbs the area (local anesthetic) will be injected into the skin right above the fractured vertebra. A small cut (incision) is then made in that same area. A hollow needle is inserted through the incision. An X-ray machine (fluoroscope) is used to guide the needle to the fractured vertebrae. Bone cement is put through the hollow needle into the fractured vertebra. It hardens in about 20 minutes. A bandage (dressing) is put over the incision site. What happens after the procedure? You will stay in a recovery area until you are awake enough to eat and drink. You will be checked to make sure you can get out of bed and walk around comfortably. Some people find that pain relief is immediate. Others may notice pain going away within 2 days of the procedure. This information is not intended to replace advice given to you by your health care provider. Make sure you discuss any questions you have with your health care provider. Document Released: 04/25/2011 Document Revised: 02/02/2016 Document Reviewed: 05/04/2013 Elsevier Interactive Patient Education  2017 [place]. Moderate Conscious Sedation, Adult, Care After These instructions provide you with information about caring for yourself after your procedure. Your health care provider may also give you more specific instructions. Your treatment has been planned according to current medical practices, but problems sometimes occur. Call your health care provider if you have **Note -Identified via Obfuscation** any problems or questions after your procedure. What can I expect after the procedure? After your procedure, it is common: To feel sleepy for several hours. To feel  clumsy and have poor balance for several hours. To have poor judgment for several hours. To vomit if you eat too soon.  Follow these instructions at home: For at least 24 hours after the procedure:  Do not: Participate in activities where you could fall or become injured. Drive. Use heavy machinery. Drink alcohol. Take sleeping pills or medicines that cause drowsiness. Make important decisions or sign legal documents. Take care of children on your own. Rest. Eating and drinking Follow the diet recommended by your health care provider. If you vomit: Drink water, juice, or soup when you can drink without vomiting. Make sure you have little or no nausea before eating solid foods. General instructions Have a responsible adult stay with you until you are awake and alert. Take over-the-counter and prescription medicines only as told by your health care provider. If you smoke, do not smoke without supervision. Keep all follow-up visits as told by your health care provider. This is important. Contact a health care provider if: You keep feeling nauseous or you keep vomiting. You feel light-headed. You develop a rash. You have a fever. Get help right away if: You have trouble breathing. This information is not intended to replace advice given to you by your health care provider. Make sure you discuss any questions you have with your health care provider. Document Released: 06/17/2013 Document Revised: 01/30/2016 Document Reviewed: 12/17/2015 Elsevier Interactive Patient Education  2018 [place]. Percutaneous Vertebroplasty, Care After These instructions give you information on caring for yourself after your procedure. Your doctor may also give you more specific instructions. Call your doctor if you have any problems or questions after your procedure. Follow these instructions at home: Take medicine as told by your doctor. Keep your wound dry and covered for 24 hours or as told by  your doctor. Ask your doctor when you can bathe or shower. Put an ice pack on your wound. Put ice in a plastic bag. Place a towel between your skin and the bag. Leave the ice on for 15-20 minutes, 3-4 times a day. Rest in your bed for 24 hours or as told by your doctor. Return to normal activities as told by your doctor. Ask your doctor what stretches and exercises you can do. Do not bend or lift anything heavy as told by your doctor. Contact a doctor if: Your wound becomes red, puffy (swollen), or tender to the touch. You are bleeding or leaking fluid from the wound. You are sick to your stomach (nauseous) or throw up (vomit) for more than 24 hours after the procedure. Your back pain does not get better. You have a fever. Get help right away if: You have bad back pain that comes on suddenly. You cannot control when you pee (urinate) or poop (bowel movement). You lose feeling (numbness) or have tingling in your legs or feet, or they become weak. You have sudden weakness in your arms or legs. You have shooting pain down your legs. You have chest pain or a hard time breathing. You feel dizzy or pass out (faint). Your vision changes or you cannot talk as you normally do. This information is not intended to replace advice given to you by your health care provider. Make sure you discuss any questions you have with your health care provider. Document Released: 11/21/2009 Document Revised: 02/02/2016 **Note -Identified via Obfuscation** Document Reviewed: 05/05/2013 Elsevier Interactive Patient Education  2018 [place]. 1. No stooping,bending or lifting more than 10 lbs for 2 weeks. 2.Use walker to ambulate for 2  Weeks . 3.RTC PRN  2 to 3 weeks

## 2018-01-31 NOTE — Procedures (Signed)
S/P T 12 VP with biopsy 

## 2018-02-02 ENCOUNTER — Other Ambulatory Visit: Payer: Self-pay

## 2018-02-02 ENCOUNTER — Encounter (HOSPITAL_COMMUNITY): Payer: Self-pay | Admitting: Emergency Medicine

## 2018-02-02 ENCOUNTER — Emergency Department (HOSPITAL_COMMUNITY): Payer: Medicare Other

## 2018-02-02 ENCOUNTER — Inpatient Hospital Stay (HOSPITAL_COMMUNITY)
Admission: EM | Admit: 2018-02-02 | Discharge: 2018-02-06 | DRG: 982 | Disposition: A | Discharge status: Skilled Nursing Facility | Payer: Medicare Other | Attending: Internal Medicine | Admitting: Internal Medicine

## 2018-02-02 DIAGNOSIS — Z8739 Personal history of other diseases of the musculoskeletal system and connective tissue: Secondary | ICD-10-CM | POA: Diagnosis not present

## 2018-02-02 DIAGNOSIS — K5900 Constipation, unspecified: Secondary | ICD-10-CM | POA: Diagnosis present

## 2018-02-02 DIAGNOSIS — R0989 Other specified symptoms and signs involving the circulatory and respiratory systems: Secondary | ICD-10-CM | POA: Diagnosis present

## 2018-02-02 DIAGNOSIS — C61 Malignant neoplasm of prostate: Secondary | ICD-10-CM | POA: Diagnosis present

## 2018-02-02 DIAGNOSIS — M4854XA Collapsed vertebra, not elsewhere classified, thoracic region, initial encounter for fracture: Secondary | ICD-10-CM | POA: Diagnosis present

## 2018-02-02 DIAGNOSIS — I491 Atrial premature depolarization: Secondary | ICD-10-CM

## 2018-02-02 DIAGNOSIS — Z79899 Other long term (current) drug therapy: Secondary | ICD-10-CM

## 2018-02-02 DIAGNOSIS — I447 Left bundle-branch block, unspecified: Secondary | ICD-10-CM

## 2018-02-02 DIAGNOSIS — R55 Syncope and collapse: Secondary | ICD-10-CM | POA: Diagnosis present

## 2018-02-02 DIAGNOSIS — D166 Benign neoplasm of vertebral column: Secondary | ICD-10-CM | POA: Diagnosis present

## 2018-02-02 DIAGNOSIS — J90 Pleural effusion, not elsewhere classified: Secondary | ICD-10-CM | POA: Diagnosis present

## 2018-02-02 DIAGNOSIS — X58XXXA Exposure to other specified factors, initial encounter: Secondary | ICD-10-CM | POA: Diagnosis present

## 2018-02-02 DIAGNOSIS — I499 Cardiac arrhythmia, unspecified: Secondary | ICD-10-CM | POA: Diagnosis not present

## 2018-02-02 DIAGNOSIS — Z7982 Long term (current) use of aspirin: Secondary | ICD-10-CM

## 2018-02-02 DIAGNOSIS — D509 Iron deficiency anemia, unspecified: Secondary | ICD-10-CM | POA: Diagnosis present

## 2018-02-02 DIAGNOSIS — Z9889 Other specified postprocedural states: Secondary | ICD-10-CM

## 2018-02-02 DIAGNOSIS — I10 Essential (primary) hypertension: Secondary | ICD-10-CM | POA: Diagnosis present

## 2018-02-02 DIAGNOSIS — D649 Anemia, unspecified: Secondary | ICD-10-CM | POA: Diagnosis not present

## 2018-02-02 DIAGNOSIS — I951 Orthostatic hypotension: Secondary | ICD-10-CM | POA: Diagnosis present

## 2018-02-02 DIAGNOSIS — R195 Other fecal abnormalities: Secondary | ICD-10-CM | POA: Diagnosis present

## 2018-02-02 DIAGNOSIS — R627 Adult failure to thrive: Secondary | ICD-10-CM | POA: Diagnosis present

## 2018-02-02 DIAGNOSIS — R011 Cardiac murmur, unspecified: Secondary | ICD-10-CM | POA: Diagnosis present

## 2018-02-02 DIAGNOSIS — R0902 Hypoxemia: Secondary | ICD-10-CM | POA: Diagnosis present

## 2018-02-02 DIAGNOSIS — F17220 Nicotine dependence, chewing tobacco, uncomplicated: Secondary | ICD-10-CM | POA: Diagnosis present

## 2018-02-02 DIAGNOSIS — Z8249 Family history of ischemic heart disease and other diseases of the circulatory system: Secondary | ICD-10-CM | POA: Diagnosis not present

## 2018-02-02 DIAGNOSIS — Z8546 Personal history of malignant neoplasm of prostate: Secondary | ICD-10-CM

## 2018-02-02 DIAGNOSIS — G909 Disorder of the autonomic nervous system, unspecified: Secondary | ICD-10-CM | POA: Diagnosis present

## 2018-02-02 HISTORY — DX: Syncope and collapse: R55

## 2018-02-02 LAB — URINALYSIS, ROUTINE W REFLEX MICROSCOPIC
BILIRUBIN URINE: NEGATIVE
GLUCOSE, UA: NEGATIVE mg/dL
HGB URINE DIPSTICK: NEGATIVE
KETONES UR: NEGATIVE mg/dL
Nitrite: NEGATIVE
PROTEIN: NEGATIVE mg/dL
Specific Gravity, Urine: 1.017 (ref 1.005–1.030)
pH: 5 (ref 5.0–8.0)

## 2018-02-02 LAB — BASIC METABOLIC PANEL
ANION GAP: 10 (ref 5–15)
BUN: 22 mg/dL — AB (ref 6–20)
CHLORIDE: 97 mmol/L — AB (ref 101–111)
CO2: 26 mmol/L (ref 22–32)
Calcium: 8.8 mg/dL — ABNORMAL LOW (ref 8.9–10.3)
Creatinine, Ser: 1.19 mg/dL (ref 0.61–1.24)
GFR calc Af Amer: >60 mL/min (ref >60)
GFR calc non Af Amer: 53 mL/min — ABNORMAL LOW (ref >60)
Glucose, Bld: 108 mg/dL — ABNORMAL HIGH (ref 65–99)
POTASSIUM: 3.4 mmol/L — AB (ref 3.5–5.1)
SODIUM: 133 mmol/L — AB (ref 135–145)

## 2018-02-02 LAB — CBC
HEMATOCRIT: 30.4 % — AB (ref 39.0–52.0)
HEMOGLOBIN: 10 g/dL — AB (ref 13.0–17.0)
MCH: 30.8 pg (ref 26.0–34.0)
MCHC: 32.9 g/dL (ref 30.0–36.0)
MCV: 93.5 fL (ref 78.0–100.0)
Platelets: 266 10*3/uL (ref 150–400)
RBC: 3.25 MIL/uL — AB (ref 4.22–5.81)
RDW: 13.3 % (ref 11.5–15.5)
WBC: 7.6 10*3/uL (ref 4.0–10.5)

## 2018-02-02 LAB — I-STAT TROPONIN, ED: Troponin i, poc: 0.01 ng/mL (ref 0.00–0.08)

## 2018-02-02 LAB — CBG MONITORING, ED: Glucose-Capillary: 97 mg/dL (ref 65–99)

## 2018-02-02 LAB — BRAIN NATRIURETIC PEPTIDE: B Natriuretic Peptide: 307.6 pg/mL — ABNORMAL HIGH (ref 0.0–100.0)

## 2018-02-02 LAB — POC OCCULT BLOOD, ED: Fecal Occult Bld: POSITIVE — AB

## 2018-02-02 LAB — TSH: TSH: 1.562 u[IU]/mL (ref 0.350–4.500)

## 2018-02-02 LAB — MAGNESIUM: Magnesium: 1.9 mg/dL (ref 1.7–2.4)

## 2018-02-02 MED ORDER — ACETAMINOPHEN 650 MG RE SUPP: 650.0000 mg | Freq: Four times a day (QID) | RECTAL | Status: DC | PRN

## 2018-02-02 MED ORDER — ACETAMINOPHEN 325 MG PO TABS: 650.0000 mg | ORAL_TABLET | Freq: Four times a day (QID) | ORAL | Status: DC | PRN

## 2018-02-02 MED ORDER — SENNOSIDES-DOCUSATE SODIUM 8.6-50 MG PO TABS
1.0000 | ORAL_TABLET | Freq: Every evening | ORAL | Status: DC | PRN
  Filled 2018-02-02 (×2): qty 1

## 2018-02-02 MED ORDER — SODIUM CHLORIDE 0.9 % IV BOLUS
1000.0000 mL | Freq: Once | INTRAVENOUS | Status: AC
Start: 2018-02-02 — End: 2018-02-02
  Administered 2018-02-02: 1000 mL via INTRAVENOUS

## 2018-02-02 MED ORDER — SODIUM CHLORIDE 0.9 % IV BOLUS
1000.0000 mL | Freq: Once | INTRAVENOUS | Status: AC
  Administered 2018-02-02: 1000 mL via INTRAVENOUS

## 2018-02-02 MED ORDER — POTASSIUM CHLORIDE CRYS ER 20 MEQ PO TBCR
40.0000 meq | EXTENDED_RELEASE_TABLET | Freq: Two times a day (BID) | ORAL | Status: AC
  Administered 2018-02-02 – 2018-02-03 (×2): 40 meq via ORAL
  Filled 2018-02-02 (×2): qty 2

## 2018-02-02 NOTE — ED Provider Notes (Signed)
[facility] EMERGENCY DEPARTMENT Provider Note  CSN: [id number] Arrival date & time: 02/02/18 1055  Chief Complaint(s) Near Syncope  HPI Jose Baldwin is a 84 y.o. male   The history is provided by the patient and a relative.  Near Syncope  This is a recurrent problem. Episode onset: 6 months. The problem occurs daily. The problem has not changed since onset.Pertinent negatives include no chest pain, no abdominal pain, no headaches and no shortness of breath. Exacerbated by: sitting. Relieved by: standing and ambulation.   Noted lightheadedness while standing.  Was seen at [facility] for the same, resulting in right orbital and maxillary sinus fracture. Thought to be dehydrated. Had mildly elevated trop attributed to renal insufficiency.  Also has been seen by PCP who decreased BP medications.  Has been hydrating and eating well. Currently on Stain and amlodipine. Off of Lopressor.      Past Medical History Past Medical History:  Diagnosis Date  . Hypertension   . Prostate cancer ([place])    There are no active problems to display for this patient.  Home Medication(s) Prior to Admission medications   Medication Sig Start Date End Date Taking? Authorizing Provider  amLODipine (NORVASC) 5 MG tablet Take 2.5 mg by mouth daily.   Yes [provider]  aspirin EC 81 MG tablet Take 81 mg by mouth daily.    Yes [provider]  metoprolol tartrate (LOPRESSOR) 25 MG tablet Take 12.5 mg by mouth 2 (two) times daily.   Yes [provider]  Multiple Vitamin (MULTI-VITAMINS) TABS Take 1 tablet by mouth daily.    Yes [provider]  ranibizumab (LUCENTIS) 0.5 MG/0.05ML SOLN 0.5 mg by Intravitreal route once. Every 8 weeks   Yes [provider]  simvastatin (ZOCOR) 20 MG tablet Take 20 mg by mouth daily. 07/04/15  Yes [provider]                                      Past Surgical History Past Surgical History:  Procedure Laterality Date  . BACK SURGERY     Family History No family history on file.  Social History Social History   Tobacco Use  . Smoking status: Former Smoker    Packs/day: 2.00    Years: 30.00    Pack years: 60.00    Types: Cigarettes    Last attempt to quit: 1990    Years since quitting: 29.4  . Smokeless tobacco: Current User    Types: Chew  Substance Use Topics  . Alcohol use: Yes    Alcohol/week: 8.4 oz    Types: 14 Cans of beer per week  . Drug use: Never   Allergies Patient has no known allergies.  Review of Systems Review of Systems  Respiratory: Negative for shortness of breath.   Cardiovascular: Positive for near-syncope. Negative for chest pain.  Gastrointestinal: Negative for abdominal pain.  Neurological: Negative for headaches.   All other systems are reviewed and are negative for acute change except as noted in the HPI  Physical Exam Vital Signs  I have reviewed the triage vital signs BP 122/56 (BP Location: Left Arm)   Pulse 67   Resp 16   SpO2 93%  Vitals:   02/02/18 1345 02/02/18 1400 02/02/18 1415 02/02/18 1416  BP: (!) 134/55 (!) 122/56 118/61 (!) 67/41  Pulse: 63 67  87  Resp: 14 17 16  16  SpO2: 100% 100% 98% 93%    Physical Exam  Constitutional: He is oriented to person, place, and time. He appears well-developed and well-nourished. No distress.  HENT:  Head: Normocephalic. Head is with contusion.    Nose: Nose normal.  Eyes: Pupils are equal, round, and reactive to light. Conjunctivae and EOM are normal. Right eye exhibits no discharge. Left eye exhibits no discharge. No scleral icterus.  Neck: Normal range of motion. Neck supple.  Cardiovascular: Normal rate and regular rhythm. Exam reveals no gallop and no friction rub.  No murmur heard. Pulmonary/Chest: Effort normal and breath sounds normal. No stridor. No respiratory distress. He has no  rales.  Abdominal: Soft. He exhibits no distension. There is no tenderness.  Musculoskeletal: He exhibits no edema or tenderness.  Neurological: He is alert and oriented to person, place, and time.  Skin: Skin is warm and dry. No rash noted. He is not diaphoretic. No erythema.  Psychiatric: He has a normal mood and affect.  Vitals reviewed.   ED Results and Treatments Labs (all labs ordered are listed, but only abnormal results are displayed) Labs Reviewed  BASIC METABOLIC PANEL - Abnormal; Notable for the following components:      Result Value   Sodium 133 (*)    Potassium 3.4 (*)    Chloride 97 (*)    Glucose, Bld 108 (*)    BUN 22 (*)    Calcium 8.8 (*)    GFR calc non Af Amer 53 (*)    All other components within normal limits  CBC - Abnormal; Notable for the following components:   RBC 3.25 (*)    Hemoglobin 10.0 (*)    HCT 30.4 (*)    All other components within normal limits  URINALYSIS, ROUTINE W REFLEX MICROSCOPIC - Abnormal; Notable for the following components:   APPearance HAZY (*)    Leukocytes, UA LARGE (*)    Bacteria, UA RARE (*)    All other components within normal limits  BRAIN NATRIURETIC PEPTIDE - Abnormal; Notable for the following components:   B Natriuretic Peptide 307.6 (*)    All other components within normal limits  POC OCCULT BLOOD, ED - Abnormal; Notable for the following components:   Fecal Occult Bld POSITIVE (*)    All other components within normal limits  TSH  CBG MONITORING, ED  I-STAT TROPONIN, ED                                                                                                                         EKG  EKG Interpretation  Date/Time:  Sunday Feb 02 2018 11:47:29 EDT Ventricular Rate:  94 PR Interval:    QRS Duration: 134 QT Interval:  402 QTC Calculation: 502 R Axis:   -60 Text Interpretation:  Undetermined rhythm ; concerning for sick sinus Left axis deviation Left bundle branch block Abnormal ECG Confirmed  by Addison Lank ([id number]) on 02/02/2018 11:53:12 AM  Radiology Dg Chest 2 View  Result Date: 02/02/2018 CLINICAL DATA:  Recurrent syncopal episodes. EXAM: CHEST - 2 VIEW COMPARISON:  None FINDINGS: Normal heart size. Aortic atherosclerosis noted. No pleural effusion or edema. Atelectasis or scar noted in the right lower lobe. Multi level age-indeterminate compression deformities are identified within the thoracic spine. One of these has been treated with bone cement. IMPRESSION: 1. Right base atelectasis versus scar. 2.  Aortic Atherosclerosis (ICD10-I70.0). Electronically Signed   By: Kerby Moors M.D.   On: 02/02/2018 13:07   Pertinent labs & imaging results that were available during my care of the patient were reviewed by me and considered in my medical decision making (see chart for details).  Medications Ordered in ED Medications  sodium chloride 0.9 % bolus 1,000 mL (1,000 mLs Intravenous New Bag/Given 02/02/18 1508)  sodium chloride 0.9 % bolus 1,000 mL (1,000 mLs Intravenous New Bag/Given 02/02/18 1505)                                                                                                                                    Procedures Procedures CRITICAL CARE Performed by: Grayce Sessions Cardama Total critical care time: 40 minutes Critical care time was exclusive of separately billable procedures and treating other patients. Critical care was necessary to treat or prevent imminent or life-threatening deterioration. Critical care was time spent personally by me on the following activities: development of treatment plan with patient and/or surrogate as well as nursing, discussions with consultants, evaluation of patient's response to treatment, examination of patient, obtaining history from patient or surrogate, ordering and performing treatments and interventions, ordering and review of laboratory studies, ordering and review of radiographic studies, pulse oximetry and  re-evaluation of patient's condition.   (including critical care time)  Medical Decision Making / ED Course I have reviewed the nursing notes for this encounter and the patient's prior records (if available in EHR or on provided paperwork).    EKG with frequent PACs/ectopic rhythm, concerning for sick sinus.  No acute ischemia.  Initial troponin negative.  Patient with significant orthostasis with blood pressures dropping down to systolics of 41L.  Hemoglobin of 10 which is improved from 2 days ago.  Family denies any hematochezia or melena but do state patient has had dark brown stools.  Hemoccult was positive.  Labs without significant electrolyte derangements or renal insufficiency.  Patient will require IV hydration.  Given notable dysrhythmia and significant orthostasis.  Feel that the patient would benefit from inpatient admission for IV hydration and cardiology evaluation.  Will discuss case with medicine.  Final Clinical Impression(s) / ED Diagnoses Final diagnoses:  Dysrhythmia  Syncope and collapse  Orthostasis  Occult GI bleeding      This chart was dictated using voice recognition software.  Despite best efforts to proofread,  errors can occur which can change the documentation meaning.   Fatima Blank, MD 02/02/18 [phone]

## 2018-02-02 NOTE — ED Triage Notes (Signed)
Son stated, He is still passing out, we went 2 weeks ago for the same to [facility]. I suggested hydration but its still not working, he is still passing out when he gets up.

## 2018-02-02 NOTE — H&P (Addendum)
Date: 02/02/2018               Patient Name:  Jose Baldwin MRN: [id number]  DOB: May 14, 1929 Age / Sex: 84 y.o., male   PCP: Sofie Hartigan, MD         Medical Service: Internal Medicine Teaching Service         Attending Physician: Dr. Evette Doffing, Mallie Mussel, *    First Contact: Dr. Aggie Hacker Pager: [phone]  Second Contact: Dr. Reesa Chew Pager: [phone]       After Hours (After 5p/  First Contact Pager: [phone]  weekends / holidays): Second Contact Pager: [phone]   Chief Complaint: recurrent syncopal episodes  History of Present Illness:  84 yo male with PMHx significant for HTN, prostate cancer, thoracic compression fractures, and recurrent syncopal episodes presenting with the chief complaint of recurrent syncopal episodes. The patient is accompanied by his son and daughter in law. The patient has been having syncopal episodes for greater than 6 months duration. Per family and patient these only occur when the patient stands. The patient states he will stand and get dizzy/lightheaded, which will at times result in falls, which he has sustained injuries from. The patient was previously on metoprolol and amlodipine for HTN, but both medications were stopped due to concerns for orthostatic hypotension. He denies CP, SOB, palpitations, or any other symptoms during these events. He was recently seen in the ED on 5/17 after LOC and subsequent injury to his right eye and right upper extremity. CT head at that time demonstrated no acute intracranial pathology, but did reveal right orbital floor fracture, nondisplaced fracture of right posterolateral wall of the maxillary sinus, and nondisplaced fracture of the anterior left orbital floor. He was discharged home.   Per family, these episodes are becoming more frequent, and seem to be occurring every time the patient stands up. The family notices he will stand with his walker, have upper extremity shaking, will be awake but somewhat unresponsive.  He will then sit down and have stiff lower extremities. After 1-2 minutes he will return to baseline. The patient does not know if he loses consciousness because the episodes happen so fast, but they are typically preceded by dizziness/lightheadedness. The family denies noticing any seizure like activity such as body shakes, tongue biting, lip smacking, or bowel/bladder incontinence. He denies these episodes occurring when he is sitting or resting. Family denies confusion following the episodes. Patient denies dizziness with change in head movements. Does endorses generalized weakness and decrease in overall strength. The son attributes this to deconditioning after a fall resulted in thoracic compression fracture in 09/2017. The patient's son has been monitoring his HR with a fit bit and has not noticed any very high or very low heart rates.   He denies recent infections or illness. Patient denies dysuria or hematuria. Bowel movements normal. Patient endorses a decreased appetite, but his family states he has a good appetite. No unintentional weight loss. No abdominal pain, dark stools, or blood in stool. Patient does not take NSAIDs or good powders for pain. No history of abnormal colonoscopies.   ED Course: Vitals: Blood pressure (!) 134/55, pulse 84, resp. rate 14, SpO2 100 %. Orthostatic vitals +: 122/56 standing HR 68 > 118/61 sitting HR 80 > 67/41 standing HR 102 Labs: Na 133, K 3.4, glucose 108, BUN 22, Cr 1.19, Hgb 10.0, MCV 93.5; I-stat troponin 0.01, FOBT+ Meds: 2 L NS bolus   Meds:  Current Meds  Medication  Sig  . amLODipine (NORVASC) 5 MG tablet Take 2.5 mg by mouth daily.  Marland Kitchen aspirin EC 81 MG tablet Take 81 mg by mouth daily.   . metoprolol tartrate (LOPRESSOR) 25 MG tablet Take 12.5 mg by mouth 2 (two) times daily.  . Multiple Vitamin (MULTI-VITAMINS) TABS Take 1 tablet by mouth daily.   . ranibizumab (LUCENTIS) 0.5 MG/0.05ML SOLN 0.5 mg by Intravitreal route once. Every 8 weeks  .  simvastatin (ZOCOR) 20 MG tablet Take 20 mg by mouth daily.     Allergies: Allergies as of 02/02/2018  . (No Known Allergies)   Past Medical History:  Diagnosis Date  . Hypertension   . Prostate cancer [facility])     Family History:  Family history reviewed, non-contributory   Social History:  Social History   Tobacco Use  . Smoking status: Former Smoker    Packs/day: 2.00    Years: 30.00    Pack years: 60.00    Types: Cigarettes    Last attempt to quit: 1990    Years since quitting: 29.4  . Smokeless tobacco: Current User    Types: Chew  Substance Use Topics  . Alcohol use: Yes    Alcohol/week: 8.4 oz    Types: 14 Cans of beer per week  . Drug use: Never    Review of Systems: A complete ROS was negative except as per HPI.   Physical Exam: Blood pressure (!) 156/68, pulse 73, resp. rate (!) 22, SpO2 100 %. Physical Exam  Constitutional: He is oriented to person, place, and time. He appears well-developed and well-nourished. No distress.  Appears stated age, thin   HENT:  Head: Normocephalic. Head is with contusion (right, periorbital ).  Eyes: Conjunctivae and EOM are normal. No scleral icterus.  Neck: No JVD present.  Cardiovascular: Normal rate. An irregular rhythm present.  Pulmonary/Chest: Effort normal and breath sounds normal.  Abdominal: Soft. Bowel sounds are normal. There is no tenderness.  Musculoskeletal: Normal range of motion. He exhibits no edema.  Neurological: He is alert and oriented to person, place, and time. He has normal strength. No cranial nerve deficit.  Upper and lower extremity strength: 4/5 bilateral and symmetric, no focal deficits   Skin: Skin is warm and dry.  Psychiatric: He has a normal mood and affect.   EKG: personally reviewed my interpretation sinus rhythm, widened QRS, PACs and PVCs, LBBB  CXR: personally reviewed my interpretation is negative for focal opacity, pulmonary edema, or pleural effusion; right lower lobe  atelectasis; Multi-level compression deformities   Assessment & Plan by Problem: Active Problems:   Syncope  Recurrent Syncope  Patient's presentation seems consistent with orthostatic hypotension, but with recurrent episodes and abnormal EKG on admission concerned about cardiogenic syncope. Orthostatic vital signs positive on admission. EKG with widened QRS, sinus rhythm with PVCs and PACs. No focal neurological deficits on examination. Does not seem consistent with seizures or CVA. Chest pain free, low suspicion for ACS, I-stat trop negative. Will get echocardiogram to rule out structural or valvular abnormalities like aortic stenosis, but did not appreciate murmur on physical exam.  -Consulted cardiology- appreciate recs -Cardiac monitoring  -Echo pending  -Carotid dopplers pending  -Checking Magnesium, TSH -Repeat orthostatics in AM after receives IVF -Repeat BMP in AM -Checking TSH  Normocytic Anemia Hemoglobin 10.0, MCV 93.5. Baseline hemoglobin ~12. FOBT positive on admission. Patient denies changes in bowel habits, melena, or BRBPR. No history of abnormal colonoscopy.  -Repeat CBC in AM -If Hgb remains stable can  likely be f/u as outpatient   Hx of thoracic vertebral compression fractures.  Hx of Prostate Cancer  Most recent PSA 0.05 05/2017. Patient recently underwent IR guided T12 kyphoplasty/vertebroplasty with bone biopsy on 5/24. Pathology from that biopsy is pending. -pain management with tylenol PRN -will follow up pathology   Abnormal UA Large leukocytes, rare bacteria. Patient asymptomatic.  -Will not treat as UTI unless patient is symptomatic or becomes febrile   DVT ppx: SCDs Code Status: Full code, confirmed on admission   Dispo: Admit patient to Observation with expected length of stay less than 2 midnights.  Signed: Melanee Spry, MD 02/02/2018, 4:44 PM  Pager: [phone]

## 2018-02-02 NOTE — ED Triage Notes (Signed)
Attempted report x1. 

## 2018-02-02 NOTE — Consult Note (Addendum)
Cardiology Consultation:   Patient ID: Jose Baldwin; [id number]; [birth year]   Admit date: 02/02/2018 Date of Consult: 02/02/2018  Primary Care Provider: Sofie Hartigan, MD Primary Cardiologist: Dr. Minus Breeding Primary Electrophysiologist:  NA   Patient Profile:   Jose Baldwin is a 84 y.o. male with a hx of syncope proceeded by lightheadedness- this started in Jan and is becoming mor freq. Other hx HTN, prostate cancer and has New pt appt with Dr. Percival Spanish 02/07/18  who is being seen today for the evaluation of syncope and abnormal EKG at the request of Dr. Reesa Chew.  History of Present Illness:   Jose Baldwin has a hx of recent syncope proceeded by lightheadedness- this started in Jan and is becoming mor freq. Other hx HTN, prostate cancer.  Today with syncope.   With the episodes to him he just falls suddenly and is not responding.  His family states before he passes out, he starts with tremors.  When they have him sit down and he is very stiff.  Then passes out.  He is having more freq.  Episodes. After episodes after a couple of min he is back to his normal,  In March he was driving.   Todays lying BP 122/56 P 68 and standing 151/68 P 82  EKG:  The EKG was personally reviewed and demonstrates:  SR with LBBB and PACs   Telemetry:  Telemetry was personally reviewed and demonstrates:  SR and PACs    BNP 307, K+ 3.4, Hgb 10, plts 266 urine with large leukocytes Heme + stool  Recent vertebroplasty injured to fall, and he has not been active due to pain.    Son is with Pt today.  No chest pain or SOB.   Past Medical History:  Diagnosis Date  . Hypertension   . Prostate cancer [facility])     Past Surgical History:  Procedure Laterality Date  . BACK SURGERY       Home Medications:  Prior to Admission medications   Medication Sig Start Date End Date Taking? Authorizing Provider  amLODipine (NORVASC) 5 MG tablet Take 2.5 mg by mouth daily.   Yes [provider]  aspirin EC  81 MG tablet Take 81 mg by mouth daily.    Yes [provider]  metoprolol tartrate (LOPRESSOR) 25 MG tablet Take 12.5 mg by mouth 2 (two) times daily.   Yes [provider]  Multiple Vitamin (MULTI-VITAMINS) TABS Take 1 tablet by mouth daily.    Yes [provider]  ranibizumab (LUCENTIS) 0.5 MG/0.05ML SOLN 0.5 mg by Intravitreal route once. Every 8 weeks   Yes [provider]  simvastatin (ZOCOR) 20 MG tablet Take 20 mg by mouth daily. 07/04/15  Yes [provider]    Inpatient Medications: Scheduled Meds:  Continuous Infusions:  PRN Meds:   Allergies:   No Known Allergies  Social History:   Social History   Socioeconomic History  . Marital status: Single    Spouse name: Not on file  . Number of children: Not on file  . Years of education: Not on file  . Highest education level: Not on file  Occupational History  . Not on file  Social Needs  . Financial resource strain: Not on file  . Food insecurity:    Worry: Not on file    Inability: Not on file  . Transportation needs:    Medical: Not on file    Non-medical: Not on file  Tobacco Use  .  Smoking status: Former Smoker    Packs/day: 2.00    Years: 30.00    Pack years: 60.00    Types: Cigarettes    Last attempt to quit: 1990    Years since quitting: 29.4  . Smokeless tobacco: Current User    Types: Chew  Substance and Sexual Activity  . Alcohol use: Yes    Alcohol/week: 8.4 oz    Types: 14 Cans of beer per week  . Drug use: Never  . Sexual activity: Not on file  Lifestyle  . Physical activity:    Days per week: Not on file    Minutes per session: Not on file  . Stress: Not on file  Relationships  . Social connections:    Talks on phone: Not on file    Gets together: Not on file    Attends religious service: Not on file    Active member of club or organization: Not on file    Attends meetings of clubs or organizations: Not on file    Relationship status: Not  on file  . Intimate partner violence:    Fear of current or ex partner: Not on file    Emotionally abused: Not on file    Physically abused: Not on file    Forced sexual activity: Not on file  Other Topics Concern  . Not on file  Social History Narrative  . Not on file    Family History:   Family History  Problem Relation Age of Onset  . Heart attack Father 82     ROS:  Please see the history of present illness.  General:no colds or fevers, no weight changes Skin:no rashes or ulcers HEENT:no blurred vision, no congestion CV:see HPI PUL:see HPI GI:no diarrhea constipation or melena, no indigestion GU:no hematuria, no dysuria MS:no joint pain, no claudication, + back pain,  Neuro:+ syncope, no lightheadedness, no speech changes  Endo:no diabetes, no thyroid disease  All other ROS reviewed and negative.     Physical Exam/Data:   Vitals:   02/02/18 1530 02/02/18 1545 02/02/18 1600 02/02/18 1615  BP: (!) 162/65 (!) 154/63 (!) 151/69 (!) 156/68  Pulse:  70 73   Resp: 15 15 16  (!) 22  SpO2:  100% 100%    No intake or output data in the 24 hours ending 02/02/18 1700 There were no vitals filed for this visit. There is no height or weight on file to calculate BMI.  General:  Frail elderly male, in no acute distress HEENT: + bruising around Rt eye  Lymph: no adenopathy Neck: no JVD Endocrine:  No thryomegaly Vascular: Rt carotid bruit ; Pedal pulses 2+ bilaterally   Cardiac:  normal S1, S2; RRR; soft systolic murmur  Lungs:  Bilateral breath sounds to auscultation bilaterally, no wheezing, + occ rhonchi no rales  Abd: soft, nontender, no hepatomegaly  Ext: no edema Musculoskeletal:  No deformities, BUE and BLE strength normal and equal, Rt arm with laceration  Skin: warm and dry  Neuro:  Alert and oriented X 3 MAE follows commands, no focal abnormalities noted Psych:  Normal affect     Relevant CV Studies: none  Laboratory Data:  Chemistry Recent Labs  Lab  02/02/18 1139  NA 133*  K 3.4*  CL 97*  CO2 26  GLUCOSE 108*  BUN 22*  CREATININE 1.19  CALCIUM 8.8*  GFRNONAA 53*  GFRAA >60  ANIONGAP 10    No results for input(s): PROT, ALBUMIN, AST, ALT, ALKPHOS, BILITOT in the  last 168 hours. Hematology Recent Labs  Lab 01/31/18 0901 02/02/18 1139  WBC 6.8 7.6  RBC 2.97* 3.25*  HGB 9.1* 10.0*  HCT 27.3* 30.4*  MCV 91.9 93.5  MCH 30.6 30.8  MCHC 33.3 32.9  RDW 13.4 13.3  PLT 214 266   Cardiac EnzymesNo results for input(s): TROPONINI in the last 168 hours.  Recent Labs  Lab 02/02/18 1239  TROPIPOC 0.01    BNP Recent Labs  Lab 02/02/18 1406  BNP 307.6*    DDimer No results for input(s): DDIMER in the last 168 hours.  Radiology/Studies:  Dg Chest 2 View  Result Date: 02/02/2018 CLINICAL DATA:  Recurrent syncopal episodes. EXAM: CHEST - 2 VIEW COMPARISON:  None FINDINGS: Normal heart size. Aortic atherosclerosis noted. No pleural effusion or edema. Atelectasis or scar noted in the right lower lobe. Multi level age-indeterminate compression deformities are identified within the thoracic spine. One of these has been treated with bone cement. IMPRESSION: 1. Right base atelectasis versus scar. 2.  Aortic Atherosclerosis (ICD10-I70.0). Electronically Signed   By: Kerby Moors M.D.   On: 02/02/2018 13:07    Assessment and Plan:   1. Syncope - agree with echo, monitor 2. SR with freq PACs on EKG + LBBB,  On lopresor 12.5 BID. No brady currently seen.   3. Possible UTI 4. Carotid bruit 5. Heme + stool    For questions or updates, please contact [place] Please consult [email or web address] for contact info under Cardiology/STEMI.   Signed, Cecilie Kicks, NP 02/02/2018 5:00 PM    Attending note:  Patient seen and examined.  Reviewed records and discussed the case with Ms. Ingold NP.  Jose Baldwin presents to the ER with family members reporting a 53-month history of recurrent syncope.  He has been following with his PCP for this,  was actually recently seen at [facility] on May 17 as well..  Patient's son states that he is having more frequent events, typically occurs when he stands up but not exclusively.  He is using a walker.  Patient states that he suddenly feels lightheaded, sometimes is able to sit down, but apparently is not responsive and "stiff" for a few seconds, and then comes around very quickly per family members that have observed these events.  He has sustained some injuries related to these events, ecchymoses under the right eye, also on his arms, also had a vertebral injury earlier in the year.  He has already been referred to see Dr. Percival Spanish with a visit scheduled on May 31 for evaluation.  On examination in the ER he is in no distress, reports no active symptoms.  There is ecchymosis under the right eye, also on the forearms.  He has a soft right carotid bruit.  Lungs are clear.  Cardiac exam reveals RRR with soft systolic murmur no gallop.  Extremities show no pitting edema.  Lab work reveals sodium 133, potassium 3.4, BUN 22, creatinine 1.19, BNP 307, hemoglobin 10.0, platelets 266, urinalysis hazy with large leukocytes and rare bacteria, fecal occult positive.  Chest x-ray reports right basilar atelectasis and aortic atherosclerosis.  Reviewed his recent ECGs from May 17 and today, he has sinus rhythm with left bundle branch block and relatively frequent PACs.  Orthostatics done in the emergency room were not positive in terms of blood pressure drop.  Patient is being admitted to the internal medicine service for further evaluation.  At this point the etiology of his syncopal events is not clear.  Presence of frequent PACs  would not be an explanation.  He does have evidence of conduction system disease at age 30 with left bundle branch block and the possibility of paroxysmal symptomatic bradycardia or pauses is certainly to be considered.  He is presently on low-dose Lopressor.  Also has a right carotid bruit, but  the presence of carotid artery disease is generally not a cause of syncope.  Would recommend telemetry monitoring, echocardiogram, carotid Dopplers.  Consider repeating orthostatics in the morning.  Our service will follow with you.  If additional outpatient cardiac follow-up is needed, he will see Dr. Percival Spanish.  Satira Sark, M.D., F.A.C.C.

## 2018-02-02 NOTE — Plan of Care (Signed)
  Problem: Education: Goal: Knowledge of General Education information will improve Outcome: Progressing   Problem: Health Behavior/Discharge Planning: Goal: Ability to manage health-related needs will improve Outcome: Progressing   Problem: Clinical Measurements: Goal: Ability to maintain clinical measurements within normal limits will improve Outcome: Progressing   Problem: Clinical Measurements: Goal: Will remain free from infection Outcome: Progressing   Problem: Clinical Measurements: Goal: Respiratory complications will improve Outcome: Progressing   Problem: Pain Managment: Goal: General experience of comfort will improve Outcome: Progressing   Problem: Safety: Goal: Ability to remain free from injury will improve Outcome: Progressing

## 2018-02-03 ENCOUNTER — Inpatient Hospital Stay (HOSPITAL_COMMUNITY): Payer: Medicare Other

## 2018-02-03 ENCOUNTER — Other Ambulatory Visit: Payer: Self-pay

## 2018-02-03 DIAGNOSIS — D649 Anemia, unspecified: Secondary | ICD-10-CM

## 2018-02-03 DIAGNOSIS — I499 Cardiac arrhythmia, unspecified: Secondary | ICD-10-CM

## 2018-02-03 DIAGNOSIS — R195 Other fecal abnormalities: Secondary | ICD-10-CM

## 2018-02-03 DIAGNOSIS — R55 Syncope and collapse: Secondary | ICD-10-CM

## 2018-02-03 LAB — CBC WITH DIFFERENTIAL/PLATELET
Abs Immature Granulocytes: 0 10*3/uL (ref 0.0–0.1)
BASOS PCT: 1 %
Basophils Absolute: 0.1 10*3/uL (ref 0.0–0.1)
EOS PCT: 2 %
Eosinophils Absolute: 0.2 10*3/uL (ref 0.0–0.7)
HEMATOCRIT: 25.5 % — AB (ref 39.0–52.0)
Hemoglobin: 8.3 g/dL — ABNORMAL LOW (ref 13.0–17.0)
IMMATURE GRANULOCYTES: 0 %
LYMPHS PCT: 10 %
Lymphs Abs: 0.7 10*3/uL (ref 0.7–4.0)
MCH: 30.6 pg (ref 26.0–34.0)
MCHC: 32.5 g/dL (ref 30.0–36.0)
MCV: 94.1 fL (ref 78.0–100.0)
MONOS PCT: 14 %
Monocytes Absolute: 1 10*3/uL (ref 0.1–1.0)
Neutro Abs: 5.2 10*3/uL (ref 1.7–7.7)
Neutrophils Relative %: 73 %
Platelets: 222 10*3/uL (ref 150–400)
RBC: 2.71 MIL/uL — AB (ref 4.22–5.81)
RDW: 13.5 % (ref 11.5–15.5)
WBC: 7.1 10*3/uL (ref 4.0–10.5)

## 2018-02-03 LAB — BASIC METABOLIC PANEL
Anion gap: 6 (ref 5–15)
BUN: 19 mg/dL (ref 6–20)
CHLORIDE: 106 mmol/L (ref 101–111)
CO2: 28 mmol/L (ref 22–32)
Calcium: 8.2 mg/dL — ABNORMAL LOW (ref 8.9–10.3)
Creatinine, Ser: 1.1 mg/dL (ref 0.61–1.24)
GFR calc Af Amer: >60 mL/min (ref >60)
GFR, EST NON AFRICAN AMERICAN: 58 mL/min — AB (ref >60)
GLUCOSE: 100 mg/dL — AB (ref 65–99)
POTASSIUM: 3.8 mmol/L (ref 3.5–5.1)
SODIUM: 140 mmol/L (ref 135–145)

## 2018-02-03 LAB — CBC
HEMATOCRIT: 25.7 % — AB (ref 39.0–52.0)
HEMOGLOBIN: 8.3 g/dL — AB (ref 13.0–17.0)
MCH: 30.5 pg (ref 26.0–34.0)
MCHC: 32.3 g/dL (ref 30.0–36.0)
MCV: 94.5 fL (ref 78.0–100.0)
Platelets: 220 10*3/uL (ref 150–400)
RBC: 2.72 MIL/uL — ABNORMAL LOW (ref 4.22–5.81)
RDW: 13.5 % (ref 11.5–15.5)
WBC: 6.4 10*3/uL (ref 4.0–10.5)

## 2018-02-03 LAB — ACTH STIMULATION, 3 TIME POINTS
CORTISOL 30 MIN: 27.5 ug/dL
CORTISOL 60 MIN: 30.6 ug/dL
Cortisol, Base: 22.5 ug/dL

## 2018-02-03 LAB — GLUCOSE, CAPILLARY: Glucose-Capillary: 86 mg/dL (ref 65–99)

## 2018-02-03 LAB — RETICULOCYTES
RBC.: 2.73 MIL/uL — AB (ref 4.22–5.81)
Retic Count, Absolute: 57.3 10*3/uL (ref 19.0–186.0)
Retic Ct Pct: 2.1 % (ref 0.4–3.1)

## 2018-02-03 MED ORDER — SODIUM CHLORIDE 0.9 % IV SOLN
INTRAVENOUS | Status: AC
  Administered 2018-02-03: 09:00:00 via INTRAVENOUS

## 2018-02-03 MED ORDER — ENSURE ENLIVE PO LIQD
237.0000 mL | Freq: Three times a day (TID) | ORAL | Status: DC
  Administered 2018-02-03 – 2018-02-06 (×8): 237 mL via ORAL

## 2018-02-03 MED ORDER — COSYNTROPIN 0.25 MG IJ SOLR
0.2500 mg | Freq: Once | INTRAMUSCULAR | Status: AC
  Administered 2018-02-03: 0.25 mg via INTRAVENOUS
  Filled 2018-02-03: qty 0.25

## 2018-02-03 NOTE — Evaluation (Signed)
Occupational Therapy Evaluation Patient Details Name: Jose Baldwin MRN: [id number] DOB: 1929-05-19 Today's Date: 02/03/2018    History of Present Illness 84 yo with PMHx: HTN, prostate cancer, thoracic compression fractures, and recurrent syncopal episodes presenting with the chief complaint of recurrent syncopal episodes   Clinical Impression   PTA, pt was living alone and was independent with ADLs and IADLs. Recently, pt has been so fatigued he has not been performing ADLs and has been using a RW for functional mobility. Currently pt requiring Min Guard A for LB ADLs and Min A for functional mobility with RW. Pt presenting with decreased strength, balance, standing tolerance, activity tolerance, and endurance. During grooming task at sink (~3 minutes standing), pt becoming fatigued, SOB, and requiring seated rest break. BP dropping to 98/31 after activity (see general comments for BP). SpO2 dropping to 86% on RA and requiring significant amount of time to return to 90s with 2L O2. Pt would benefit from further acute OT to facilitate safe dc. Recommend dc to SNF for further OT to optimize safety, independence with ADLs, and return to PLOF.      Follow Up Recommendations  SNF;Supervision/Assistance - 24 hour    Equipment Recommendations  Other (comment)(Defer to next venue)    Recommendations for Other Services PT consult     Precautions / Restrictions Precautions Precautions: Fall Restrictions Weight Bearing Restrictions: No      Mobility Bed Mobility Overal bed mobility: Modified Independent             General bed mobility comments: Increased time and effort  Transfers Overall transfer level: Needs assistance Equipment used: Rolling walker (2 wheeled) Transfers: Sit to/from Stand Sit to Stand: Min guard;Min assist         General transfer comment: Min Guard A for standing and safety. Min A for safe descent due to weakness. VCs for hand placement    Balance  Overall balance assessment: Mild deficits observed, not formally tested                                         ADL either performed or assessed with clinical judgement   ADL Overall ADL's : Needs assistance/impaired Eating/Feeding: Set up;Sitting   Grooming: Oral care;Wash/dry face;Standing;Set up;Supervision/safety Grooming Details (indicate cue type and reason): Pt performing grooming at sink. After ~3 minutes at sink, pt began to breath heavily and stated he needed to sit down. Pt SpO2 dropping to 80% on RA; placing him on 2L O2 and it returned to 90s. Pt presenting with decreased activity tolerance and low endurance. Pt deconditioned. Pt presenting with poor pinch and grasp strength as seen when opening denture container Upper Body Bathing: Sitting;Set up;Supervision/ safety   Lower Body Bathing: Min guard;Sit to/from stand   Upper Body Dressing : Set up;Supervision/safety;Sitting   Lower Body Dressing: Min guard;Sit to/from stand Lower Body Dressing Details (indicate cue type and reason): Pt able to reach forward to adjust socks. But requires increased effort due to deconditioning and poor activity tolerance. Toilet Transfer: Minimal assistance;Ambulation;RW(Simulated to recliner) Armed forces technical officer Details (indicate cue type and reason): MIn A for safe descent         Functional mobility during ADLs: Min guard;Rolling walker General ADL Comments: Pt presenting with poor activity tolerance. Pt requiring rest breaks throughout ADLs and presents with SOB and weakness. Pt stating "I have no strength" when attempting to open container  for dentures. Pt requiring significant amount of time for ADLs and functional mobility     Vision         Perception     Praxis      Pertinent Vitals/Pain Pain Assessment: No/denies pain     Hand Dominance Right   Extremity/Trunk Assessment Upper Extremity Assessment Upper Extremity Assessment: Generalized weakness   Lower  Extremity Assessment Lower Extremity Assessment: Generalized weakness   Cervical / Trunk Assessment Cervical / Trunk Assessment: Kyphotic;Other exceptions Cervical / Trunk Exceptions: Forward neck flexion   Communication Communication Communication: HOH(Soft spoken)   Cognition Arousal/Alertness: Awake/alert Behavior During Therapy: WFL for tasks assessed/performed Overall Cognitive Status: Within Functional Limits for tasks assessed                                     General Comments  SpO2 in 90s on RA. During grooming at sink, pt becoming SOB and SpO2 dropping to 80%. Placing pt on 2L O2 and SpO2 returning to 90s. RN notified. BP: supine 153/79, EOB 129/76, standing 126/113, and after activity 98/31. RN notified    Exercises     Shoulder Instructions      Home Living Family/patient expects to be discharged to:: Private residence Living Arrangements: Alone Available Help at Discharge: Family;Available PRN/intermittently Type of Home: House Home Access: Stairs to enter [employer] of Steps: 5 Entrance Stairs-Rails: Right;Left Home Layout: One level     Bathroom Shower/Tub: Corporate investment banker: Handicapped height     Home Equipment: Shower seat;Grab bars - tub/shower;Walker - 2 wheels   Additional Comments: Has a puppy      Prior Functioning/Environment Level of Independence: Independent with assistive device(s)        Comments: ADLs, IADLs, driving. Has been using RW for last two weeks        OT Problem List: Decreased strength;Decreased range of motion;Impaired balance (sitting and/or standing);Decreased activity tolerance;Decreased knowledge of precautions;Decreased knowledge of use of DME or AE;Cardiopulmonary status limiting activity      OT Treatment/Interventions: Self-care/ADL training;Therapeutic exercise;Energy conservation;DME and/or AE instruction;Therapeutic activities;Patient/family education     OT Goals(Current goals can be found in the care plan section) Acute Rehab OT Goals Patient Stated Goal: Feel better OT Goal Formulation: With patient Time For Goal Achievement: 02/17/18 Potential to Achieve Goals: Good ADL Goals Pt Will Perform Grooming: with modified independence;standing Pt Will Perform Upper Body Dressing: with modified independence;sitting Pt Will Perform Lower Body Dressing: with modified independence;sit to/from stand Pt Will Transfer to Toilet: with modified independence;ambulating;bedside commode Pt Will Perform Toileting - Clothing Manipulation and hygiene: with modified independence;sit to/from stand Pt Will Perform Tub/Shower Transfer: Tub transfer;ambulating;shower seat;with supervision;rolling walker Additional ADL Goal #1: Pt will maintain standing for 8-10 minutes during ADLs with supervision  OT Frequency: Min 2X/week   Barriers to D/C:            Co-evaluation              AM-PAC PT "6 Clicks" Daily Activity     Outcome Measure Help from another person eating meals?: None Help from another person taking care of personal grooming?: A Little Help from another person toileting, which includes using toliet, bedpan, or urinal?: A Little Help from another person bathing (including washing, rinsing, drying)?: A Little Help from another person to put on and taking off regular upper body clothing?: A Little Help from another person  to put on and taking off regular lower body clothing?: A Little 6 Click Score: 19   End of Session Equipment Utilized During Treatment: Gait belt;Rolling walker;Oxygen Nurse Communication: Mobility status;Other (comment)(SpO2 and BP)  Activity Tolerance: Patient limited by fatigue Patient left: in chair;with call bell/phone within reach;with chair alarm set  OT Visit Diagnosis: Unsteadiness on feet (R26.81);Other abnormalities of gait and mobility (R26.89);Muscle weakness (generalized) (M62.81)                Time:  1751-0258 OT Time Calculation (min): 37 min Charges:  OT General Charges $OT Visit: 1 Visit OT Evaluation $OT Eval Moderate Complexity: 1 Mod OT Treatments $Self Care/Home Management : 8-22 mins G-Codes:     Wilberto Console MSOT, OTR/L Acute Rehab Pager: [phone] Office: [place] 02/03/2018, 8:53 AM

## 2018-02-03 NOTE — Progress Notes (Signed)
Progress Note  Patient Name: Jose Baldwin Date of Encounter: 02/03/2018  Primary Cardiologist:   Minus Breeding, MD   Subjective   No complaints overnight.  No presyncope but was in bed all night  Inpatient Medications    Scheduled Meds: . cosyntropin  0.25 mg Intravenous Once  . potassium chloride  40 mEq Oral BID   Continuous Infusions:  PRN Meds: acetaminophen **OR** acetaminophen, senna-docusate   Vital Signs    Vitals:   02/02/18 2038 02/03/18 0022 02/03/18 0434 02/03/18 0700  BP: (!) 131/59 (!) 146/67 (!) 147/63   Pulse: 74 77 79   Resp: 20 20 20    Temp: 98.4 F (36.9 C) 98.2 F (36.8 C) 98.1 F (36.7 C)   TempSrc: Oral Oral Oral   SpO2: 100% 95% 95%   Weight:   130 lb (59 kg)   Height:    6\' 3"  (1.905 m)    Intake/Output Summary (Last 24 hours) at 02/03/2018 0735 Last data filed at 02/03/2018 1884 Gross per 24 hour  Intake 2360 ml  Output 175 ml  Net 2185 ml   Filed Weights   02/03/18 0434  Weight: 130 lb (59 kg)    Telemetry    NSR,PACs - Personally Reviewed  ECG    NA - Personally Reviewed  Physical Exam   GEN: No acute distress.  Frail appearing Neck: No  JVD Cardiac: RRR, no murmurs, rubs, or gallops.  Respiratory: Clear  to auscultation bilaterally. GI: Soft, nontender, non-distended  MS: No  edema; No deformity. Neuro:  Nonfocal  Psych: Normal affect   Labs    Chemistry Recent Labs  Lab 02/02/18 1139 02/03/18 0339  NA 133* 140  K 3.4* 3.8  CL 97* 106  CO2 26 28  GLUCOSE 108* 100*  BUN 22* 19  CREATININE 1.19 1.10  CALCIUM 8.8* 8.2*  GFRNONAA 53* 58*  GFRAA >60 >60  ANIONGAP 10 6     Hematology Recent Labs  Lab 01/31/18 0901 02/02/18 1139 02/03/18 0339  WBC 6.8 7.6 6.4  RBC 2.97* 3.25* 2.72*  HGB 9.1* 10.0* 8.3*  HCT 27.3* 30.4* 25.7*  MCV 91.9 93.5 94.5  MCH 30.6 30.8 30.5  MCHC 33.3 32.9 32.3  RDW 13.4 13.3 13.5  PLT 214 266 220    Cardiac EnzymesNo results for input(s): TROPONINI in the last  168 hours.  Recent Labs  Lab 02/02/18 1239  TROPIPOC 0.01     BNP Recent Labs  Lab 02/02/18 1406  BNP 307.6*     DDimer No results for input(s): DDIMER in the last 168 hours.   Radiology    Dg Chest 2 View  Result Date: 02/02/2018 CLINICAL DATA:  Recurrent syncopal episodes. EXAM: CHEST - 2 VIEW COMPARISON:  None FINDINGS: Normal heart size. Aortic atherosclerosis noted. No pleural effusion or edema. Atelectasis or scar noted in the right lower lobe. Multi level age-indeterminate compression deformities are identified within the thoracic spine. One of these has been treated with bone cement. IMPRESSION: 1. Right base atelectasis versus scar. 2.  Aortic Atherosclerosis (ICD10-I70.0). Electronically Signed   By: Kerby Moors M.D.   On: 02/02/2018 13:07    Cardiac Studies   Echo pending.    Patient Profile     84 y.o. male with a hx of syncope proceeded by lightheadedness- this started in Jan and is becoming mor freq. Other hx HTN, prostate cancer who is being seen for the evaluation of syncope and abnormal EKG at the request of Dr.  Amin.   Assessment & Plan    SYNCOPE:  Etiology is not clear.  Frail with 15 - 20 lbs of unintentional weigh loss over 6 months.  Underlying process to explain evolving failure to thrive might explain the "spells" that he is having.  For now we will proceed with the echo and out patient event monitor.  He has a falling Hbg with guaiac positive stool.  Further work up of this per primary team.     For questions or updates, please contact [place] Please consult [email or web address] for contact info under Cardiology/STEMI.   Signed, Minus Breeding, MD  02/03/2018, 7:35 AM

## 2018-02-03 NOTE — Progress Notes (Signed)
VASCULAR LAB PRELIMINARY  PRELIMINARY  PRELIMINARY  PRELIMINARY  Carotid duplex completed.    Preliminary report:  1-39% right ICA stenosis.  40-59% left ICA stenosis.  Vertebral artery flow is antegrade.   Nikoli Nasser, RVT 02/03/2018, 11:36 AM

## 2018-02-03 NOTE — Plan of Care (Signed)
  Problem: Education: Goal: Knowledge of General Education information will improve Outcome: Progressing   Problem: Health Behavior/Discharge Planning: Goal: Ability to manage health-related needs will improve Outcome: Progressing   

## 2018-02-03 NOTE — Progress Notes (Signed)
Orthostatic vitals are done and recorded on flow sheet.

## 2018-02-03 NOTE — Progress Notes (Signed)
Internal Medicine Attending:   I saw and examined the patient. I reviewed the resident's note and I agree with the resident's findings and plan as documented in the resident's note.  Persistent orthostatic hypotension, will give more fluid today. Echo pending but anticipate normal EF. We talked about lifestyle interventions. Stim testing is fine. Appreciate cardiology input.

## 2018-02-03 NOTE — Evaluation (Signed)
Physical Therapy Evaluation Patient Details Name: Jose Baldwin MRN: [id number] DOB: [birth year] Today's Date: 02/03/2018   History of Present Illness  84 yo with PMHx: HTN, prostate cancer, thoracic compression fractures, and recurrent syncopal episodes presenting with the chief complaint of recurrent syncopal episodes  Clinical Impression  Pt pleasant, with flat affect who states he has had difficulty with caring for himself for the last 3 weeks and basically hasn't been able to do any of it. Pt reports he has no warning of syncopal events and this session without TED hose or Abdominal binder present utilized SCDs for initial standing trial with BP stable for sitting and standing at 112/46 (68). Then, applied ACE wrap to bil LE for limited gait with BP 70/55 (62). Pt with decreased strength, function, gait, balance and cardiopulmonary status who will benefit from acute therapy to maximize mobility, function and gait to decrease burden of care. Pt without significant family support with BP not conducive to functional activity and pt with deconditioning limiting his ability to care for himself.   SpO2 87-89% on RA at rest with pt placed on 1L with SpO2 91%    Follow Up Recommendations SNF;Supervision/Assistance - 24 hour    Equipment Recommendations  None recommended by PT    Recommendations for Other Services       Precautions / Restrictions Precautions Precautions: Fall Precaution Comments: check BP . TED hose and abdominal binder ordered but not yet present. utilized bil ACE for standing Restrictions Weight Bearing Restrictions: No      Mobility  Bed Mobility Overal bed mobility: Needs Assistance Bed Mobility: Sit to Supine       Sit to supine: Min assist   General bed mobility comments: assist to bring legs onto surface  Transfers Overall transfer level: Needs assistance Equipment used: Rolling walker (2 wheeled) Transfers: Sit to/from Stand x 2 trials Sit to Stand: Min  guard         General transfer comment: cues for hand placement, guarding for mobility and safety  Ambulation/Gait Ambulation/Gait assistance: Minguard assist Ambulation Distance (Feet): 15 Feet Assistive device: Rolling walker (2 wheeled) Gait Pattern/deviations: Step-through pattern;Decreased stride length;Trunk flexed   Gait velocity interpretation: <1.31 ft/sec, indicative of household ambulator General Gait Details: cues for posture, limited gait due to pt fatigue and BP  Stairs            Wheelchair Mobility    Modified Rankin (Stroke Patients Only)       Balance Overall balance assessment: Needs assistance   Sitting balance-Leahy Scale: Fair       Standing balance-Leahy Scale: Poor Standing balance comment: reliant on bil UE support in standing and with gait                             Pertinent Vitals/Pain Pain Assessment: No/denies pain    Home Living Family/patient expects to be discharged to:: Skilled nursing facility Living Arrangements: Alone Available Help at Discharge: Family;Available PRN/intermittently Type of Home: House Home Access: Stairs to enter Entrance Stairs-Rails: Psychiatric nurse of Steps: 5 Home Layout: One level Home Equipment: Shower seat;Grab bars - tub/shower;Walker - 2 wheels Additional Comments: Has a puppy    Prior Function Level of Independence: Independent with assistive device(s)         Comments: walks with RW     Hand Dominance   Dominant Hand: Right    Extremity/Trunk Assessment   Upper Extremity Assessment Upper Extremity  Assessment: Defer to OT evaluation    Lower Extremity Assessment Lower Extremity Assessment: Generalized weakness    Cervical / Trunk Assessment Cervical / Trunk Assessment: Kyphotic Cervical / Trunk Exceptions: Forward neck flexion  Communication   Communication: HOH;Other (comment)  Cognition Arousal/Alertness: Awake/alert Behavior During  Therapy: Flat affect Overall Cognitive Status: Within Functional Limits for tasks assessed                                        General Comments     Exercises     Assessment/Plan    PT Assessment Patient needs continued PT services  PT Problem List Decreased strength;Decreased mobility;Decreased activity tolerance;Decreased knowledge of use of DME;Decreased balance;Cardiopulmonary status limiting activity       PT Treatment Interventions DME instruction;Functional mobility training;Balance training;Patient/family education;Gait training;Therapeutic activities;Therapeutic exercise    PT Goals (Current goals can be found in the Care Plan section)  Acute Rehab PT Goals Patient Stated Goal: be able to move PT Goal Formulation: With patient Time For Goal Achievement: 02/17/18 Potential to Achieve Goals: Fair    Frequency Min 3X/week   Barriers to discharge Decreased caregiver support      Co-evaluation               AM-PAC PT "6 Clicks" Daily Activity  Outcome Measure Difficulty turning over in bed (including adjusting bedclothes, sheets and blankets)?: A Little Difficulty moving from lying on back to sitting on the side of the bed? : A Little Difficulty sitting down on and standing up from a chair with arms (e.g., wheelchair, bedside commode, etc,.)?: A Little Help needed moving to and from a bed to chair (including a wheelchair)?: A Little Help needed walking in hospital room?: A Little Help needed climbing 3-5 steps with a railing? : A Lot 6 Click Score: 17    End of Session Equipment Utilized During Treatment: Gait belt;Oxygen Activity Tolerance: Patient tolerated treatment well Patient left: in bed;with call bell/phone within reach;Other (comment)(with vascular) Nurse Communication: Mobility status PT Visit Diagnosis: Other abnormalities of gait and mobility (R26.89);Muscle weakness (generalized) (M62.81)    Time: 0981-1914 PT Time  Calculation (min) (ACUTE ONLY): 36 min   Charges:   PT Evaluation $PT Eval Moderate Complexity: 1 Mod PT Treatments $Therapeutic Activity: 8-22 mins   PT G Codes:        Elwyn Reach, PT [phone]   Jose Baldwin 02/03/2018, 9:53 AM

## 2018-02-03 NOTE — Progress Notes (Addendum)
   Subjective:   Patient seen and examined. No acute complaints. Patient states he lives alone and has been overall independent, and still drives. He states he has been drinking 6-8 water bottles per day and that has not helped his episodes.   Objective:  Vital signs in last 24 hours: Vitals:   02/03/18 0434 02/03/18 0700 02/03/18 0836 02/03/18 0941  BP: (!) 147/63 (!) 153/79 (!) 98/31 (!) 70/55  Pulse: 79     Resp: 20     Temp: 98.1 F (36.7 C)     TempSrc: Oral     SpO2: 95%   (!) 87%  Weight: 130 lb (59 kg)     Height:  6\' 3"  (1.905 m)     General: Thin, chronically ill appearing, Laying in bed comfortably, NAD HEENT: Schubert, right periorbital ecchymosis in various stages of healing, EOMI, no scleral icterus; dry oropharynx  Cardiac: RRR, No R/M/G appreciated Pulm: normal effort, CTAB in anterior lung fields Abd: scaphoid,  non tender, non distended, BS normal Ext: extremities well perfused, no peripheral edema Neuro: alert and oriented X3, cranial nerves II-XII grossly intact   Assessment/Plan:  Active Problems:   Syncope  Recurrent Syncope secondary to orthostatic hypotension Patient's presentation consistent with orthostatic hypotension. Orthostatic vital signs positive on admission and positive again this morning, BP lying 153/79 >> BP standing 98/31.  POCUS with collapsible IVC. Patient appears dry on physical exam.  -Will given additional 1.5 liter NS - 250 cc/hr over 6 hours  -ACTH stim test to r/o adrenal insufficiency  -Continue Cardiac monitoring  -Echo pending  -Carotid dopplers pending  -PT/OT evaluation - patient got very fatigued during PT/OT exercises recommend SNF   Normocytic Anemia Hemoglobin 8.3, MCV 94.5. Baseline hemoglobin ~10-12. FOBT positive on admission. Patient denies changes in bowel habits, melena, or BRBPR. No history of abnormal colonoscopy. -Reticulocyte count pending, will f/u -Will continue to trend with daily CBC -As of now will not  pursue GI consult, if hgb continues to drop will consider GI consult; if hgb remains stable can likely be worked up as an outpatient   Hx of thoracic vertebral compression fractures.  Hx of Prostate Cancer  Most recent PSA 0.05 05/2017. Patient recently underwent IR guided T12 kyphoplasty/vertebroplasty with bone biopsy on 5/24. Pathology from that biopsy is pending.  -pain management with tylenol PRN -will follow up pathology   Abnormal UA Large leukocytes, rare bacteria. Patient asymptomatic.  -Will not treat UTI  DVT ppx: SCDs  Code Status: Full code, confirmed on admission   Dispo: Anticipated discharge in approximately 1-2 day(s).   Melanee Spry, MD 02/03/2018, 10:15 AM Pager: [phone]

## 2018-02-04 ENCOUNTER — Inpatient Hospital Stay (HOSPITAL_COMMUNITY): Payer: Medicare Other

## 2018-02-04 ENCOUNTER — Encounter (HOSPITAL_COMMUNITY): Payer: Self-pay | Admitting: Interventional Radiology

## 2018-02-04 DIAGNOSIS — Z8739 Personal history of other diseases of the musculoskeletal system and connective tissue: Secondary | ICD-10-CM

## 2018-02-04 DIAGNOSIS — I10 Essential (primary) hypertension: Secondary | ICD-10-CM | POA: Diagnosis present

## 2018-02-04 DIAGNOSIS — R55 Syncope and collapse: Secondary | ICD-10-CM

## 2018-02-04 DIAGNOSIS — I951 Orthostatic hypotension: Secondary | ICD-10-CM | POA: Diagnosis present

## 2018-02-04 DIAGNOSIS — C61 Malignant neoplasm of prostate: Secondary | ICD-10-CM | POA: Diagnosis present

## 2018-02-04 DIAGNOSIS — D509 Iron deficiency anemia, unspecified: Secondary | ICD-10-CM | POA: Diagnosis present

## 2018-02-04 DIAGNOSIS — Z8546 Personal history of malignant neoplasm of prostate: Secondary | ICD-10-CM

## 2018-02-04 LAB — IRON AND TIBC
IRON: 31 ug/dL — AB (ref 45–182)
Saturation Ratios: 14 % — ABNORMAL LOW (ref 17.9–39.5)
TIBC: 218 ug/dL — ABNORMAL LOW (ref 250–450)
UIBC: 187 ug/dL

## 2018-02-04 LAB — CBC
HEMATOCRIT: 26.1 % — AB (ref 39.0–52.0)
Hemoglobin: 8.5 g/dL — ABNORMAL LOW (ref 13.0–17.0)
MCH: 30.6 pg (ref 26.0–34.0)
MCHC: 32.6 g/dL (ref 30.0–36.0)
MCV: 93.9 fL (ref 78.0–100.0)
Platelets: 236 10*3/uL (ref 150–400)
RBC: 2.78 MIL/uL — ABNORMAL LOW (ref 4.22–5.81)
RDW: 13.5 % (ref 11.5–15.5)
WBC: 7.4 10*3/uL (ref 4.0–10.5)

## 2018-02-04 LAB — GLUCOSE, CAPILLARY
GLUCOSE-CAPILLARY: 100 mg/dL — AB (ref 65–99)
Glucose-Capillary: 92 mg/dL (ref 65–99)

## 2018-02-04 LAB — FERRITIN: Ferritin: 96 ng/mL (ref 24–336)

## 2018-02-04 LAB — BASIC METABOLIC PANEL
Anion gap: 7 (ref 5–15)
BUN: 20 mg/dL (ref 6–20)
CHLORIDE: 106 mmol/L (ref 101–111)
CO2: 26 mmol/L (ref 22–32)
CREATININE: 1.12 mg/dL (ref 0.61–1.24)
Calcium: 8.5 mg/dL — ABNORMAL LOW (ref 8.9–10.3)
GFR calc Af Amer: >60 mL/min (ref >60)
GFR calc non Af Amer: 57 mL/min — ABNORMAL LOW (ref >60)
GLUCOSE: 96 mg/dL (ref 65–99)
POTASSIUM: 3.9 mmol/L (ref 3.5–5.1)
Sodium: 139 mmol/L (ref 135–145)

## 2018-02-04 LAB — ECHOCARDIOGRAM COMPLETE
HEIGHTINCHES: 75 in
WEIGHTICAEL: 2179.2 [oz_av]

## 2018-02-04 LAB — VITAMIN B12: Vitamin B-12: 492 pg/mL (ref 180–914)

## 2018-02-04 MED ORDER — FERROUS SULFATE 325 (65 FE) MG PO TABS
325.0000 mg | ORAL_TABLET | Freq: Every day | ORAL | Status: DC
  Administered 2018-02-04 – 2018-02-05 (×2): 325 mg via ORAL
  Filled 2018-02-04 (×2): qty 1

## 2018-02-04 MED ORDER — SODIUM CHLORIDE 0.9 % IV SOLN
INTRAVENOUS | Status: AC
  Administered 2018-02-04: 11:00:00 via INTRAVENOUS

## 2018-02-04 NOTE — Progress Notes (Addendum)
Progress Note  Patient Name: Jose Baldwin Date of Encounter: 02/04/2018  Primary Cardiologist: Dr. Percival Spanish   Subjective   No complaints.  Denies syncope or presyncopal episodes. No chest pain   Inpatient Medications    Scheduled Meds: . feeding supplement (ENSURE ENLIVE)  237 mL Oral TID BM   Continuous Infusions:  PRN Meds: acetaminophen **OR** acetaminophen, senna-docusate   Vital Signs    Vitals:   02/03/18 0941 02/03/18 1212 02/03/18 1930 02/04/18 0546  BP: (!) 70/55 (!) 168/61 (!) 151/82 (!) 152/68  Pulse:  74 80 83  Resp:  18 18 18   Temp:  98 F (36.7 C) 99.1 F (37.3 C) (!) 97.5 F (36.4 C)  TempSrc:   Oral Oral  SpO2: (!) 87% 98% 100% 96%  Weight:    136 lb 3.2 oz (61.8 kg)  Height:        Intake/Output Summary (Last 24 hours) at 02/04/2018 0821 Last data filed at 02/04/2018 0557 Gross per 24 hour  Intake 838 ml  Output 900 ml  Net -62 ml   Filed Weights   02/03/18 0434 02/04/18 0546  Weight: 130 lb (59 kg) 136 lb 3.2 oz (61.8 kg)   Physical Exam   General: Frail, elderly, NAD Skin: Warm, dry, intact  Head: Normocephalic, atraumatic, clear, moist mucus membranes. Neck: Negative for carotid bruits. No JVD Lungs:Clear to ausculation bilaterally. No wheezes, rales, or rhonchi. Breathing is unlabored. Cardiovascular: RRR with S1 S2. No murmurs, rubs or gallops Abdomen: Soft, non-tender, non-distended with normoactive bowel sounds.  No obvious abdominal masses. MSK: Strength and tone appear decreased for age. 5/5 in all extremities Extremities: No edema. No clubbing or cyanosis. DP/PT pulses 1+ bilaterally Neuro: Alert and oriented. No focal deficits. No facial asymmetry. MAE spontaneously. Psych: Responds to questions appropriately with normal affect.    Labs    Chemistry Recent Labs  Lab 02/02/18 1139 02/03/18 0339 02/04/18 0601  NA 133* 140 139  K 3.4* 3.8 3.9  CL 97* 106 106  CO2 26 28 26   GLUCOSE 108* 100* 96  BUN 22* 19 20    CREATININE 1.19 1.10 1.12  CALCIUM 8.8* 8.2* 8.5*  GFRNONAA 53* 58* 57*  GFRAA >60 >60 >60  ANIONGAP 10 6 7     Hematology Recent Labs  Lab 02/03/18 0339 02/03/18 0921 02/04/18 0601  WBC 6.4 7.1 7.4  RBC 2.72* 2.71*  2.73* 2.78*  HGB 8.3* 8.3* 8.5*  HCT 25.7* 25.5* 26.1*  MCV 94.5 94.1 93.9  MCH 30.5 30.6 30.6  MCHC 32.3 32.5 32.6  RDW 13.5 13.5 13.5  PLT 220 222 236   Cardiac EnzymesNo results for input(s): TROPONINI in the last 168 hours.  Recent Labs  Lab 02/02/18 1239  TROPIPOC 0.01     BNP Recent Labs  Lab 02/02/18 1406  BNP 307.6*     DDimer No results for input(s): DDIMER in the last 168 hours.   Radiology    Dg Chest 2 View  Result Date: 02/02/2018 CLINICAL DATA:  Recurrent syncopal episodes. EXAM: CHEST - 2 VIEW COMPARISON:  None FINDINGS: Normal heart size. Aortic atherosclerosis noted. No pleural effusion or edema. Atelectasis or scar noted in the right lower lobe. Multi level age-indeterminate compression deformities are identified within the thoracic spine. One of these has been treated with bone cement. IMPRESSION: 1. Right base atelectasis versus scar. 2.  Aortic Atherosclerosis (ICD10-I70.0). Electronically Signed   By: Kerby Moors M.D.   On: 02/02/2018 13:07  Telemetry    02/04/18 NSR with PAC LBBB - Personally Reviewed  ECG    02/02/2018 artifact.  NSR with LBBB and frequent PAC- Personally Reviewed  Cardiac Studies   Echocardiogram 02/03/2018: Pending  Patient Profile     84 y.o. male with a hx of syncope proceeded by lightheadedness- this started in Jan and is becoming mor freq. Other hx HTN, prostate cancer and has New pt appt with Dr. Percival Spanish 02/07/18  who is being seen today for the evaluation of syncope and abnormal EKG at the request of Dr. Reesa Chew.  Assessment & Plan    1.  Syncope of unclear etiology: -Patient presented to ER with 31-month history of recurrent, more frequent syncopal episodes. He has been following with his PCP  was recently seen Jan 24, 2018.  Patient reports events typically occur while standing, suddenly feels lightheaded and becomes unresponsive and "stiff "for several seconds.  Family member states he comes around fairly quickly and returns to normal within several minutes.  Prior to Dr. Percival Spanish with a visit scheduled for 02/07/2018. -EKG with NSR and LBBB with relatively frequent PACs -Orthostatics with 42mmHg drop in SBP>> lying-147/65, sitting-129/81, standing-119/73 -Patient hydrated with IV fluids, 1.5 L -Per chart review, patient with evidence of conduction disease with LBBB.  Patient with possible symptomatic bradycardia versus pauses could be considered -Echocardiogram with pending results -Carotid Dopplers ordered secondary to right carotid bruit>>> right carotid consistent with 1-39% stenosis, left carotid consistent with 40-59% stenosis -Plans for an outpatient event monitor at discharge along with assessment of echocardiogram results  2.  Anemia: -Hemoglobin, 8.5.  Baseline appears to be in the 10.0- 12.0 range -FOBT positive on admission, no reports of change in bowel habits, melena or BRBPR -Further work-up per primary team   Signed, Kathyrn Drown NP-C [place] Pager: [phone] 02/04/2018, 8:21 AM    For questions or updates, please contact  Please consult [email or web address] for contact info under Cardiology/STEMI.  Patient seen and independently examined with Kathyrn Drown, NP. We discussed all aspects of the encounter. I agree with the assessment and plan as stated above.  Is admitted with multiple syncopal episodes over the past 6 months usually occurring while standing.  He becomes unresponsive and stiff for several seconds but quickly returns to normal within several minutes.  Not had any further syncopal episodes since being in the hospital.  GEN: Well nourished, well developed in no acute distress HEENT: Normal NECK: No JVD; No carotid bruits LYMPHATICS: No  lymphadenopathy CARDIAC:RRR, no murmurs, rubs, gallops RESPIRATORY:  Clear to auscultation without rales, wheezing or rhonchi  ABDOMEN: Soft, non-tender, non-distended MUSCULOSKELETAL:  No edema; No deformity  SKIN: Warm and dry NEUROLOGIC:  Alert and oriented x 3 PSYCHIATRIC:  Normal affect   Orthostatic blood pressures showed a 27 mm drop in systolic blood pressure from lying to standing.  This is after being hydrated with 1.5 L of fluid.  He likely has orthostatic hypotension.  Also has a left bundle branch block so need to consider bradycardia arrhythmias as an etiology as well.  Recommend compression hose.  Avoid Florinef given underlying hypertension.  May need to consider Proamatine if compression hose do not help.  Will get outpatient event monitor to assess for bradycardia arrhythmias.  If he continues to have syncope despite compression hose and negative event monitor may need to consider implantable loop recorder.  2D echocardiogram pending.  If echo shows normal LV function and no further inpatient cardiac work-up.  We will arrange outpatient event  monitor and follow-up in our office.  Signed: Fransico Him, MD [facility] 02/05/2018

## 2018-02-04 NOTE — Discharge Summary (Addendum)
Name: Jose Baldwin MRN: [id number] DOB: 1929-04-19 84 y.o. PCP: Sofie Hartigan, MD  Date of Admission: 02/02/2018 11:01 AM Date of Discharge: 02/06/18 Attending Physician: Oda Kilts, MD  Discharge Diagnosis: 1. Syncope due to Orthostatic Hypotension Principal Problem:   Syncope Active Problems:   HTN (hypertension)   Prostate cancer (HCC)   Orthostatic hypotension   Iron deficiency anemia   Discharge Medications: Allergies as of 02/06/2018   No Known Allergies     Medication List    STOP taking these medications   amLODipine 5 MG tablet Commonly known as:  NORVASC   aspirin EC 81 MG tablet   metoprolol tartrate 25 MG tablet Commonly known as:  LOPRESSOR     TAKE these medications   feeding supplement (ENSURE ENLIVE) Liqd Take 237 mLs by mouth 3 (three) times daily between meals.   ferrous sulfate 325 (65 FE) MG tablet Take 1 tablet (325 mg total) by mouth daily with breakfast. Start taking on:  02/07/2018   midodrine 10 MG tablet Commonly known as:  PROAMATINE Take 1 tablet (10 mg total) by mouth 3 (three) times daily with meals.   MULTI-VITAMINS Tabs Take 1 tablet by mouth daily.   polyethylene glycol packet Commonly known as:  MIRALAX / GLYCOLAX Take 17 g by mouth daily. Start taking on:  02/07/2018   ranibizumab 0.5 MG/0.05ML Soln Commonly known as:  LUCENTIS 0.5 mg by Intravitreal route once. Every 8 weeks   senna-docusate 8.6-50 MG tablet Commonly known as:  Senokot-S Take 1 tablet by mouth at bedtime as needed for mild constipation.   simvastatin 20 MG tablet Commonly known as:  ZOCOR Take 20 mg by mouth daily.       Disposition and follow-up:   Mr.Jakota H Hornstein was discharged from [facility] in Stable condition.  At the hospital follow up visit please address:  1.   Syncope due to orthostatic hypotension -Recurrence of syncope? -Follow up with Cardiology for event monitor  -TED hose and abdominal  binder use -Midodrine 10 mg TID with meals -Discontinued aspirin as the risk of bleed outweighs the benefit in this patient   Normocytic Anemia -Continue daily iron supplementation -Outpatient gastroenterology follow up -Please ensure that the patient will follow up with GI  Hypertension  -DISCONTINUE ALL BLOOD PRESSURE MEDICATION -Discontinue amlodipine and metoprolol  -Started on midodrine 10 mg TID with meals   Nutrition -Regular diet -Do not salt restrict the patient  -Recommend Ensure TID with meals  -Monitor patient's fluid intake, recommend > 2 liters of fluid intake daily   2.  Labs / imaging needed at time of follow-up: CBC, GASTROENTEROLOGY REFERRAL   3.  Pending labs/ test needing follow-up: none  Follow-up Appointments: Contact information for after-discharge care    Destination    [facility] .   Service:  Skilled Nursing Contact information: [place] [facility] Course by problem list: Principal Problem:   Syncope Active Problems:   HTN (hypertension)   Prostate cancer (HCC)   Orthostatic hypotension   Iron deficiency anemia   1. Syncope due to orthostatic hypotension [name] presented to [facility] with recurrent syncopal episodes resulting in multiple injuries.  Orthostatic vital signs were positive on admission. EKG was NSR with PACs and LBBB, no evidence of acute ischemia. Cardiology was consulted. The patient was given >4 liters  of fluid. His orthostatic hypotension persisted despite fluid resuscitation, TED hose, and abdominal binders. He was asymptomatic and denied dizziness or lightheadedness upon standing. He was started on Midodrine 10 mg TID with meals. All blood pressure medications were stopped and will not be resumed at discharge. Cardiac monitoring did not reveal any abnormal arrhythmias. Transthoracic echocardiogram demonstrated normal  systolic function, EF 30-09%, and grade 1 diastolic dysfunction. He will follow have an event monitor at discharge and will follow up with Cardiology.   2. Intermittent Episodes of Hypoxia Patient had several episodes of asymptomatic hypoxia with oxygen saturations in the the low 80s. Chest xray revealed left layering pleural effusion, which occurred after the patient received > 4 liters of IV fluids. He was weaned to room air and had an ambulatory pulse oximetry in the high 90s while working with physical therapy on the day of discharge. Resolved at discharge. No need for supplemental oxygen.   3. Normocytic Anemia Patient presented with normocytic anemia. Iron studies were consistent with iron deficiency anemia. The patient denied any change in bowel habits, melena, or BRBPR. FOBT was positive on admission. No prior history of abnormal endoscopies.The patient's hemoglobin remained stable and a gastroenterology work up was not initiated during this hospitalization. The patient should follow up as an outpatient with gastroenterology.  4. Hypertension  All blood pressure medications were held. The patient was previously taking Amlodipine and Metoprolol. DO NOT RESTART blood pressure medications. The midodrine will make the patient hypertensive.   Discharge Vitals:   BP (!) 164/74 (BP Location: Left Arm)   Pulse 81   Temp 98.5 F (36.9 C) (Oral)   Resp 18   Ht 6\' 3"  (1.905 m)   Wt 135 lb (61.2 kg) Comment: scale a  SpO2 98%   BMI 16.87 kg/m   Pertinent Labs, Studies, and Procedures:  BMP Latest Ref Rng & Units 02/06/2018 02/05/2018 02/04/2018  Glucose 65 - 99 mg/dL 101(H) 99 96  BUN 6 - 20 mg/dL 23(H) 20 20  Creatinine 0.61 - 1.24 mg/dL 1.17 1.06 1.12  Sodium 135 - 145 mmol/L 137 139 139  Potassium 3.5 - 5.1 mmol/L 4.1 4.2 3.9  Chloride 101 - 111 mmol/L 103 102 106  CO2 22 - 32 mmol/L 28 28 26   Calcium 8.9 - 10.3 mg/dL 8.4(L) 8.6(L) 8.5(L)   CBC Latest Ref Rng & Units 02/06/2018 02/05/2018  02/04/2018  WBC 4.0 - 10.5 K/uL 9.5 11.5(H) 7.4  Hemoglobin 13.0 - 17.0 g/dL 8.7(L) 9.3(L) 8.5(L)  Hematocrit 39.0 - 52.0 % 27.0(L) 28.7(L) 25.6(L)  Platelets 150 - 400 K/uL 230 261 236   Transthoracic Echocardiogram  Study Conclusions - Left ventricle: The cavity size was normal. There was mild   concentric hypertrophy. Systolic function was normal. The   estimated ejection fraction was in the range of 60% to 65%. Wall   motion was normal; there were no regional wall motion   abnormalities. Doppler parameters are consistent with abnormal   left ventricular relaxation (grade 1 diastolic dysfunction).   Doppler parameters are consistent with indeterminate ventricular   filling pressure. - Aortic valve: Transvalvular velocity was within the normal range.   There was no stenosis. There was no regurgitation. - Mitral valve: Transvalvular velocity was within the normal range.   There was no evidence for stenosis. There was no regurgitation. - Left atrium: The atrium was severely dilated. - Right ventricle: The cavity size was normal. Wall thickness was   normal. Systolic function was normal. - Right atrium:  The atrium was mildly dilated. - Tricuspid valve: There was trivial regurgitation. - Pulmonary arteries: Systolic pressure was within the normal   range. - Pericardium, extracardiac: A trivial pericardial effusion was   identified. There was a left pleural effusion.  CHEST - 2 VIEW  COMPARISON:  02/02/2018  FINDINGS: Layering left pleural effusion. Left base atelectasis. No confluent opacity on the right. Heart is mildly enlarged. Left lateral 7th rib fracture noted, age indeterminate. Multiple old healed right rib fractures. No pneumothorax.  IMPRESSION: Layering left pleural effusion with left base atelectasis.  Age-indeterminate left lateral 7th rib fracture. Old right rib fractures.  Discharge Instructions: Discharge Instructions    Call MD for:   Complete by:  As  directed    Call MD for:  extreme fatigue   Complete by:  As directed    Call MD for:  persistant dizziness or light-headedness   Complete by:  As directed    Diet general   Complete by:  As directed    Regular diet. No salt restrictions.   Discharge instructions   Complete by:  As directed    Mr. Nikash, Mortensen were admitted to the hospital due to orthostatic hypotension. When you stand up your blood pressure drops very low. This causes you to get dizzy and sometimes pass out. Please wear your [facility] and abdominal binder. Drink plenty of fluids and add salt to your food. I have also prescribed you a medication called Midodrine. This will help keep your blood pressure elevated and prevent future episodes of passing out.   Take Midodrine 3 times day with meals.   Please follow up with the Cardiologist's that saw you in the hospital. You have an appointment scheduled with them on 03/04/2018 at 3:40 PM.   Please follow up with your primary care doctor to discuss this hospitalization.   Increase activity slowly   Complete by:  As directed    Increase activity slowly   Complete by:  As directed       Signed: Melanee Spry, MD 02/06/2018, 1:06 PM   Pager: [phone]  Internal Medicine Attending Note:  I saw and examined the patient on the day of discharge. I reviewed and agree with the discharge summary written by the house staff.  84 yo man admitted for recurrent syncope due to orthostatic hypotension. Severe orthostatic hypotension persisted despite discontinuing all antihypertensives (he was supposed to do this at home, but reportedly had not) and repletion with >5 L of IV fluids, as well as the addition of compression hose and an abdominal binder. Discussed with cardiology consult team, TTE with no significant changes, they recommended midodrine over fludrocortisone, which we started. His orthostatic hypotension was less dramatic with the addition of midodrine and he denied any  presyncopal symptoms with standing or walking. He has hypertension at rest, but the risk of further syncopal episodes outweighs the risk of hypertension for him. He was discharged to SNF based on PT recommendations.   He also has a new iron deficiency anemia of unknown origin, and I would recommend work up for occult GI bleed. His Hgb was stable at ~8.7 during admission with no signs of active bleeding.   Lenice Pressman, M.D., Ph.D.

## 2018-02-04 NOTE — Progress Notes (Signed)
Patient's sats on room air on assessment, 81%. Patinet asymptomatic. RN placed patient on nasal canula on 2L sats 100%. No complaints at this time. Will continue to monitor patient.

## 2018-02-04 NOTE — Progress Notes (Addendum)
MD called to bedside to discuss discharge plan. Patient and family met at bedside and were confused regarding discharge to home with home health.   Apparently there has been a misunderstanding and patient would like discharge to acute rehab facility, particularly [facility] and/or any of the [employer] in [place] or [place] area, and not home health services.   Discussed plan with patient's son and daughter-in-law who were bedside: Daren [place] [phone] [place] [phone]  Have placed SW consult to facilitate with SNF placement. Appreciate their assistance with discharge planning.

## 2018-02-04 NOTE — Progress Notes (Signed)
  Date: 02/04/2018  Patient name: Jose Baldwin  Medical record number: [id number]  Date of birth: [birth year]   I have seen and evaluated this patient and I have discussed the plan of care with the house staff. Please see their note for complete details. I concur with their findings with the following additions/corrections:   Here with recurrent orthostatic syncope. Continues to have orthostatic hypotension after IV fluids, will give additional fluids today, continue compression stockings and abdominal binder. Although appeared hypovolemic on admission and his reported intake seems quite low, doubt that so many recurrent episodes of syncope would be related to hypovolemia. More likely, he has some degree of degenerative autonomic dysfunction. Hopefully symptoms will improve after fluids and holding all antihypertensives, continuing compression stockings and binder. Resting BP is now elevated, would be reluctant to use fludrocortisone, but recurrent falls are clearly the greatest risk to his health at this time. Given his relatively poor nutritional intake, he would certainly benefit from maximizing interventions after discharge to improve nutrition. Agree he would benefit from SNF, but he is refusing.   TTE shows normal EF and no significant valvular dysfunction, trivial pericardial effusion and small pleural effusion of unknown etiology. Appreciate cardiology input, plan for outpatient follow up and cardiac monitor.  Iron deficiency anemia of unknown origin, ferritin is not terribly low, will likely need outpatient evaluation for GI malignancy but should not need IV iron. Will start oral iron daily.   Lenice Pressman, M.D., Ph.D. 02/04/2018, 1:50 PM

## 2018-02-04 NOTE — Progress Notes (Signed)
   Subjective:   No acute complaints. Denies dizziness or lightheadedness when standing yesterday. He wants to go home today. Patient states he typically eats three meals a day that consist of: sticky bun and sometimes an egg for breakfast, he'll go out to lunch, then usually an egg for dinner. He typically cooks for himself. Will drink coffee, pepsi, and water.   The patient does not want to go to skilled nursing facility, but he is amenable to have home health.   Objective:  Vital signs in last 24 hours: Vitals:   02/03/18 0941 02/03/18 1212 02/03/18 1930 02/04/18 0546  BP: (!) 70/55 (!) 168/61 (!) 151/82 (!) 152/68  Pulse:  74 80 83  Resp:  18 18 18   Temp:  98 F (36.7 C) 99.1 F (37.3 C) (!) 97.5 F (36.4 C)  TempSrc:   Oral Oral  SpO2: (!) 87% 98% 100% 96%  Weight:    136 lb 3.2 oz (61.8 kg)  Height:       General: Thin, chronically ill appearing, Laying in bed comfortably, NAD HEENT: [place], right periorbital ecchymosis in various stages of healing, EOMI, no scleral icterus Cardiac: RRR, No R/M/G appreciated Pulm: normal effort, CTAB in anterior lung fields Abd: scaphoid,  non tender, non distended, BS normal Ext: extremities well perfused, no peripheral edema Neuro: alert and oriented X3, cranial nerves II-XII grossly intact   Assessment/Plan:  Active Problems:   Syncope  Recurrent Syncope secondary to orthostatic hypotension Patient's presentation consistent with orthostatic hypotension. Orthostatic vital signs positive have consistently been positive. Somewhat improved after receiving an additional 1.5 L of IVF: Lying 147/65 >> Sitting 129/81>>Standing at 0 minutes 101/54>> 119/73. ACTH stim test WNL. Carotid dopplers WNL, left ICA 40-59% stenosis, but likely not contributing.  -Will given additional 1.5 liter NS - 250 cc/hr over 6 hours  -Continue Cardiac monitoring  -Echo completes, final results pending  -PT/OT evaluation - recommend SNF, patient declining SNF at  this time  -Will consider stating pharmacotherapy such as midodrine or fludrocortisone   Normocytic Anemia Stable, hemoglobin 8.5. Retic inappropriately normal normal. Baseline hemoglobin ~10-12. FOBT positive on admission. Patient denies changes in bowel habits, melena, or BRBPR. No history of abnormal colonoscopy. -Will check Ferritin, Vit B12, Folate -Will continue to trend with daily CBC -As of now will not pursue GI consult, if hgb continues to drop will consider GI consult; if hgb remains stable can likely be worked up as an outpatient   Hx of thoracic vertebral compression fractures.  Hx of Prostate Cancer  Most recent PSA 0.05 05/2017. Patient recently underwent IR guided T12 kyphoplasty/vertebroplasty with bone biopsy on 5/24. Pathology from that biopsy is pending.  -pain management with tylenol PRN -will follow up pathology    DVT ppx: SCDs  Code Status: Full code, confirmed on admission   Dispo: Anticipated discharge in approximately 0-1 day(s).   Melanee Spry, MD 02/04/2018, 11:16 AM Pager: [phone]

## 2018-02-04 NOTE — Progress Notes (Signed)
2D echo showed normal LVF with no valvular heart disease.  OK to discharge home from cardiac standpoint  Will sign off.  He has an appt with Dr. Percival Spanish next month.  Please send home with TED hose stockings.  We will arrange outpt event monitor.

## 2018-02-04 NOTE — Care Management Note (Addendum)
Case Management Note  Patient Details  Name: BLASE BECKNER MRN: [id number] Date of Birth: [birth year]  Subjective/Objective:  Admitted for Recurrent Syncope secondary to orthostatic hypotension.             Action/Plan: In to speak with patient, discussed referral for [facility], offered choice and patient states he has never had to use home health before permission given to discuss home health with son Daren; refused nurse aide. NCM spoke with son Daren regarding [facility] (advised per notes patient refused SNF), offered choice; requested list of Home Health Agencies be left for him to review. Discussed DME needs, patient denies need at this time.      Expected Discharge Date:   To be determined               Expected Discharge Plan:  [place]  In-House Referral:  Clinical Social Work  Discharge planning Services  CM Consult  Post Acute Care Choice:  Home Health Choice offered to:  Patient  DME Arranged:   n/a DME Agency:    n/a HH Arranged:  Refused SNF [place] Agency:     Status of Service:  In process, will continue to follow  Kristen Cardinal, RN 02/04/2018, 12:12 PM

## 2018-02-04 NOTE — Progress Notes (Signed)
Echocardiogram 2D Echocardiogram has been performed.  Matilde Bash 02/04/2018, 9:17 AM

## 2018-02-05 ENCOUNTER — Inpatient Hospital Stay (HOSPITAL_COMMUNITY): Payer: Medicare Other

## 2018-02-05 LAB — BASIC METABOLIC PANEL
Anion gap: 9 (ref 5–15)
BUN: 20 mg/dL (ref 6–20)
CO2: 28 mmol/L (ref 22–32)
Calcium: 8.6 mg/dL — ABNORMAL LOW (ref 8.9–10.3)
Chloride: 102 mmol/L (ref 101–111)
Creatinine, Ser: 1.06 mg/dL (ref 0.61–1.24)
GFR calc Af Amer: >60 mL/min (ref >60)
GFR calc non Af Amer: >60 mL/min (ref >60)
Glucose, Bld: 99 mg/dL (ref 65–99)
Potassium: 4.2 mmol/L (ref 3.5–5.1)
Sodium: 139 mmol/L (ref 135–145)

## 2018-02-05 LAB — CBC
HCT: 28.7 % — ABNORMAL LOW (ref 39.0–52.0)
Hemoglobin: 9.3 g/dL — ABNORMAL LOW (ref 13.0–17.0)
MCH: 30.3 pg (ref 26.0–34.0)
MCHC: 32.4 g/dL (ref 30.0–36.0)
MCV: 93.5 fL (ref 78.0–100.0)
Platelets: 261 10*3/uL (ref 150–400)
RBC: 3.07 MIL/uL — ABNORMAL LOW (ref 4.22–5.81)
RDW: 13.6 % (ref 11.5–15.5)
WBC: 11.5 10*3/uL — ABNORMAL HIGH (ref 4.0–10.5)

## 2018-02-05 LAB — FOLATE RBC
FOLATE, RBC: 1638 ng/mL (ref >498)
Folate, Hemolysate: 419.2 ng/mL
Hematocrit: 25.6 % — ABNORMAL LOW (ref 37.5–51.0)

## 2018-02-05 MED ORDER — MIDODRINE HCL 5 MG PO TABS
10.0000 mg | ORAL_TABLET | Freq: Three times a day (TID) | ORAL | Status: DC
  Administered 2018-02-05: 10 mg via ORAL
  Filled 2018-02-05 (×2): qty 2

## 2018-02-05 NOTE — Progress Notes (Signed)
Occupational Therapy Treatment Patient Details Name: Jose Baldwin MRN: [id number] DOB: 1929-03-12 Today's Date: 02/05/2018    History of present illness 84 yo with PMHx: HTN, prostate cancer, thoracic compression fractures, and recurrent syncopal episodes presenting with the chief complaint of recurrent syncopal episodes   OT comments  Pt received awake, supine in bed with TED hoses on and agreeable to therapy. Pt performing donning socks at EOB with significant time and increased effort requiring rest break after task. Donning abdominal binder at EOB. Pt performing functional mobility to restroom with Min Guard A and RW. During mobility to restroom (in doorway of restroom), pt becoming rigid and urinating; pt able to communicate and reports feeling very weak. Pt requiring Min A to transfer to/from toilet. Pt BP dropping from 170/133 to 79/51 during functional mobility. See general comments for all BP readings. Continue to recommend dc to SNF and will continue to follow acutely as admitted.      Follow Up Recommendations  SNF;Supervision/Assistance - 24 hour    Equipment Recommendations  Other (comment)(Defer to next venue)    Recommendations for Other Services PT consult    Precautions / Restrictions Precautions Precautions: Fall Precaution Comments: check BP . TED hose and abdominal binder in room Restrictions Weight Bearing Restrictions: No       Mobility Bed Mobility Overal bed mobility: Needs Assistance Bed Mobility: Supine to Sit     Supine to sit: Supervision     General bed mobility comments: supervision for safety  Transfers Overall transfer level: Needs assistance Equipment used: Rolling walker (2 wheeled) Transfers: Sit to/from Stand Sit to Stand: Min guard         General transfer comment: cues for hand placement, guarding for mobility and safety    Balance Overall balance assessment: Needs assistance Sitting-balance support: No upper extremity  supported;Feet supported Sitting balance-Leahy Scale: Fair Sitting balance - Comments: Able to bend forward to don socks   Standing balance support: Bilateral upper extremity supported Standing balance-Leahy Scale: Poor Standing balance comment: reliant on bil UE support in standing and with gait                           ADL either performed or assessed with clinical judgement   ADL Overall ADL's : Needs assistance/impaired                   Upper Body Dressing Details (indicate cue type and reason): max A to don abdominal binder Lower Body Dressing: Min guard;Sit to/from stand Lower Body Dressing Details (indicate cue type and reason): Pt donning socks at EOB with increased time and effort. Pt SOB after activity and reporting fatigue. Requiring rest break after donning socks Toilet Transfer: Minimal assistance;Ambulation;Grab bars;RW;Regular Glass blower/designer Details (indicate cue type and reason): Pt performing functional mobility to bathroom. Requiring Min A for descent to regular height toilet and Min A to power up into standing. use of grab bars.         Functional mobility during ADLs: Min guard;Rolling walker;Moderate assistance General ADL Comments: Pt performing functional mobility to toileting. Initially requiring MIn Guard A for safety during mobility and progressing to needing Mod A due to drop in BP. Pt requiring Min A for toilet transfer. Pt Bp dropping from 170/133 to 79/51 during mobility to bathroom. Pt becoming rigid right before getting to the toileting and then urinating on the floor. Pt reports he knew he was urinating but was unable  to control it. Pt reports that he felt significantly weak and as if he couldn't move. See general comments for all BP readings. Bringing bed to restroom door so pt may take three steps to EOB. Pt's BP dropping again during short distance mobility.      Vision       Perception     Praxis      Cognition  Arousal/Alertness: Awake/alert Behavior During Therapy: WFL for tasks assessed/performed Overall Cognitive Status: Within Functional Limits for tasks assessed                                 General Comments: Pt generally flat expression and is tired of being in the hospital. He is agreeable to therapy and laughing with therapist after a little while.        Exercises     Shoulder Instructions       General Comments SpO2 staying ~88%-95% on 2L O2.  BP: 134/72 supine, 106/61 EOB, 154/54 with binder on at EOB, 170/133 standing (with both TED hose and binder), 79/51 after mobility to bathroom and sitting on toilet, 136/92 after rest break on toilet, 68/47 after taking three steps to return to bed, and 127/69 supine in bed at end of session.     Pertinent Vitals/ Pain       Pain Assessment: No/denies pain  Home Living                                          Prior Functioning/Environment              Frequency  Min 2X/week        Progress Toward Goals  OT Goals(current goals can now be found in the care plan section)  Progress towards OT goals: Not progressing toward goals - comment(Continues to have orthostic hypotension)  Acute Rehab OT Goals Patient Stated Goal: be able to move OT Goal Formulation: With patient Time For Goal Achievement: 02/17/18 Potential to Achieve Goals: Good ADL Goals Pt Will Perform Grooming: with modified independence;standing Pt Will Perform Upper Body Dressing: with modified independence;sitting Pt Will Perform Lower Body Dressing: with modified independence;sit to/from stand Pt Will Transfer to Toilet: with modified independence;ambulating;bedside commode Pt Will Perform Toileting - Clothing Manipulation and hygiene: with modified independence;sit to/from stand Pt Will Perform Tub/Shower Transfer: Tub transfer;ambulating;shower seat;with supervision;rolling walker Additional ADL Goal #1: Pt will maintain  standing for 8-10 minutes during ADLs with supervision  Plan Discharge plan remains appropriate    Co-evaluation                 AM-PAC PT "6 Clicks" Daily Activity     Outcome Measure   Help from another person eating meals?: None Help from another person taking care of personal grooming?: A Little Help from another person toileting, which includes using toliet, bedpan, or urinal?: A Little Help from another person bathing (including washing, rinsing, drying)?: A Little Help from another person to put on and taking off regular upper body clothing?: A Little Help from another person to put on and taking off regular lower body clothing?: A Little 6 Click Score: 19    End of Session Equipment Utilized During Treatment: Gait belt;Rolling walker;Oxygen;Other (comment)(TED hose and abdominal binder)  OT Visit Diagnosis: Unsteadiness on feet (R26.81);Other abnormalities of gait and mobility (  R26.89);Muscle weakness (generalized) (M62.81)   Activity Tolerance Patient limited by fatigue;Other (comment)(BP)   Patient Left with call bell/phone within reach;in bed(with transport team)   Nurse Communication Mobility status;Other (comment)(SpO2 and BP)        Time: 5320-2334 OT Time Calculation (min): 47 min  Charges: OT General Charges $OT Visit: 1 Visit OT Treatments $Self Care/Home Management : 38-52 mins  [place], OTR/L Acute Rehab Pager: [phone] Office: [place] 02/05/2018, 11:27 AM

## 2018-02-05 NOTE — NC FL2 (Signed)
  [place] LEVEL OF CARE SCREENING TOOL     IDENTIFICATION  Patient Name: RANDE ROYLANCE Birthdate: 1929-07-14 Sex: male Admission Date (Current Location): 02/02/2018  [facility] [place] and [place] Number:  [employer] and Address:  [facility] [place] [place] [address] [place], [place] [address]      Provider Number: [id number]  Attending Physician Name and Address:  Oda Kilts, MD  Relative Name and Phone Number:       Current Level of Care: Hospital Recommended Level of Care: [place] Prior Approval Number:    Date Approved/Denied:   PASRR Number: [id number]  Discharge Plan: SNF    Current Diagnoses: Patient Active Problem List   Diagnosis Date Noted  . HTN (hypertension) 02/04/2018  . Prostate cancer ([place]) 02/04/2018  . Orthostatic hypotension 02/04/2018  . Iron deficiency anemia 02/04/2018  . Syncope 02/02/2018    Orientation RESPIRATION BLADDER Height & Weight     Self, Time, Situation, Place  O2(Nasal Canula 2 L) Continent Weight: 123 lb 14.4 oz (56.2 kg) Height:  6\' 3"  (190.5 cm)  BEHAVIORAL SYMPTOMS/MOOD NEUROLOGICAL BOWEL NUTRITION STATUS  (None) (None) Continent Diet(Regular)  AMBULATORY STATUS COMMUNICATION OF NEEDS Skin   Limited Assist Verbally Skin abrasions, Bruising, Surgical wounds(Catheter entry/exit, MASD.)                       Personal Care Assistance Level of Assistance  Bathing, Feeding, Dressing Bathing Assistance: Limited assistance Feeding assistance: Limited assistance Dressing Assistance: Limited assistance     Functional Limitations Info  Sight, Hearing, Speech Sight Info: Adequate Hearing Info: Adequate Speech Info: Adequate    SPECIAL CARE FACTORS FREQUENCY  PT (By licensed PT), Blood pressure, OT (By licensed OT)     PT Frequency: 5 x week OT Frequency: 5 x week            Contractures Contractures Info: Not present    Additional Factors Info  Code  Status, Allergies Code Status Info: Full Allergies Info: NKDA           Current Medications (02/05/2018):  This is the current hospital active medication list Current Facility-Administered Medications  Medication Dose Route Frequency Provider Last Rate Last Dose  . acetaminophen (TYLENOL) tablet 650 mg  650 mg Oral Q6H PRN Lorella Nimrod, MD       Or  . acetaminophen (TYLENOL) suppository 650 mg  650 mg Rectal Q6H PRN Lorella Nimrod, MD      . feeding supplement (ENSURE ENLIVE) (ENSURE ENLIVE) liquid 237 mL  237 mL Oral TID BM Lacroce, Hulen Shouts, MD   237 mL at 02/05/18 0900  . ferrous sulfate tablet 325 mg  325 mg Oral Q breakfast Melanee Spry, MD   325 mg at 02/05/18 0859  . senna-docusate (Senokot-S) tablet 1 tablet  1 tablet Oral QHS PRN Lorella Nimrod, MD         Discharge Medications: Please see discharge summary for a list of discharge medications.  Relevant Imaging Results:  Relevant Lab Results:   Additional Information SS#: [id number]  Candie Chroman, LCSW

## 2018-02-05 NOTE — Progress Notes (Signed)
  Date: 02/05/2018  Patient name: Jose Baldwin  Medical record number: [id number]  Date of birth: 28-Sep-1928   I have seen and evaluated this patient and I have discussed the plan of care with the house staff. Please see their note for complete details. I concur with their findings with the following additions/corrections:   Continued severe orthostatic hypotension despite more than 5 L of fluid repletion.  He does not appear to be volume responsive.  Given his severe injuries he is already suffered from these syncopal episodes and his ongoing risk for further severe injury or death, we will need to treat his orthostatic hypotension as best we can.  Compression stockings and abdominal binder along with holding antihypertensives has not been sufficient to effect much improvement.  At this point we need to move forward with pharmacologic therapy.  Cardiology expressed a preference for midodrine over fludrocortisone, although both will likely lead to hypertension at rest.  We will initiate midodrine and monitor his response here.  He will need to demonstrate the ability to stand and walk without orthostatic hypotension or lightheadedness.  At this point, he is agreeable to discharge to SNF, which I think is appropriate for him.  Moving forward, and emphasis on good nutrition and fluid intake will be essential.  We will continue iron supplementation for apparent iron deficiency anemia of unknown origin and plan for further outpatient work-up.  Lenice Pressman, M.D., Ph.D. 02/05/2018, 3:08 PM

## 2018-02-05 NOTE — Progress Notes (Signed)
   Subjective:  Patient seen and examined. Is now agreeable to go to SNF for short term rehab. Has been desatting, O2 sat to 81% yesterday evening. Patient denies any respiratory symptoms or shortness of breath. Orthostatics this morning were again positive. The patient has denied any dizziness or lightheadedness with standing. He states he feels fine and is without acute complaints.   Objective:  Vital signs in last 24 hours: Vitals:   02/04/18 2135 02/04/18 2200 02/04/18 2315 02/05/18 0514  BP: (!) 161/73  (!) 147/75 (!) 155/85  Pulse: 80  75 (!) 101  Resp: 15 15 20 16   Temp: 97.9 F (36.6 C)  97.7 F (36.5 C) 98.3 F (36.8 C)  TempSrc: Oral  Oral Oral  SpO2: 99% 100% 100% 94%  Weight:    123 lb 14.4 oz (56.2 kg)  Height:       General: Thin, chronically ill appearing, sitting on side of bed  HEENT: [place], right periorbital ecchymosis in various stages of healing, EOMI, no scleral icterus Cardiac: RRR, No R/M/G appreciated Pulm: normal effort, CTAB Abd: scaphoid,  non tender, non distended, BS normal Ext: ted hose on, no peripheral edema Neuro: alert and oriented X3, cranial nerves II-XII grossly intact   Assessment/Plan:  Principal Problem:   Syncope Active Problems:   HTN (hypertension)   Prostate cancer (H[facility])   Orthostatic hypotension   Iron deficiency anemia  Recurrent Syncope secondary to orthostatic hypotension Patient's presentation consistent with orthostatic hypotension. Orthostatic vital signs positive have consistently been positive. He has been fluid resuscitated with approximately 4.5 liters of IVF and remains orthostatic. Likely autonomic dysfunction playing a role. Echo demonstrated normal systolic function with EF 60-65%, G1DD. No valvular or structural abnormalities that may be contributing to orthostatic hypotension and syncope. -Continue use of TED hose and abdominal binder -Will start midodrine 10 mg TID  -Holding additional IVF -Continue Cardiac  monitoring  -Continue PT/OT -D/c SNF when stable and bed available, appreciate social works assistance  -Cardiology to arrange event monitor and will f/u with patient once discharged   Intermittent hypoxia and new oxygen requirement Patient asymptomatic. CXR with left layering pleural effusion and left basilar atelectasis likely contributing to hypoxia.  -Hold IVF -Incentive spirometry -Will continue to monitor  -Keep O2 sats >92%  -Wean to room air if tolerable   Normocytic Anemia Stable, hemoglobin 9.3. Retic inappropriately normal normal. Baseline hemoglobin ~10-12. FOBT positive on admission. Patient denies changes in bowel habits, melena, or BRBPR. No history of abnormal colonoscopy. Ferritin and B12 within normal limits.  -Start PO iron supplementation -Will continue to trend with daily CBC -As of now will not pursue GI consult, if hgb continues to drop will consider GI consult; if hgb remains stable can likely be worked up as an outpatient   Hx of thoracic vertebral compression fractures.  Hx of Prostate Cancer  Most recent PSA 0.05 05/2017. Patient recently underwent IR guided T12 kyphoplasty/vertebroplasty with bone biopsy on 5/24. Biopsy with no evidence of malignancy.  -pain management with tylenol PRN  DVT ppx: SCDs  Code Status: Full code, confirmed on admission   Dispo: Anticipated discharge pending clinical improvement and SNf placement.   Melanee Spry, MD 02/05/2018, 6:34 AM Pager: [phone]

## 2018-02-05 NOTE — Progress Notes (Signed)
Physical Therapy Treatment Patient Details Name: Jose Baldwin MRN: [id number] DOB: 14-Nov-1928 Today's Date: 02/05/2018    History of Present Illness 84 yo with PMHx: HTN, prostate cancer, thoracic compression fractures, and recurrent syncopal episodes presenting with the chief complaint of recurrent syncopal episodes    PT Comments    Patient with continued orthostatic hypotension, this session with c/o of symptoms.  Ambulated 60' min guard with RW and close chair follow. SNF recs still appropriate at this time due to risk of falling and deconditioning.   Vitals:  Seated 147/75 Standing 102/49 3 minutes standing 97/60    Follow Up Recommendations  SNF;Supervision/Assistance - 24 hour     Equipment Recommendations  None recommended by PT    Recommendations for Other Services       Precautions / Restrictions Precautions Precautions: Fall Precaution Comments: check BP . TED hose and abdominal binder in room Restrictions Weight Bearing Restrictions: No    Mobility  Bed Mobility Overal bed mobility: Needs Assistance Bed Mobility: Supine to Sit     Supine to sit: Supervision        Transfers Overall transfer level: Needs assistance Equipment used: Rolling walker (2 wheeled) Transfers: Sit to/from Stand Sit to Stand: Min guard         General transfer comment: cues for hand placement, guarding for mobility and safety  Ambulation/Gait Ambulation/Gait assistance: Min guard Ambulation Distance (Feet): 60 Feet Assistive device: Rolling walker (2 wheeled) Gait Pattern/deviations: Step-through pattern;Decreased stride length;Trunk flexed Gait velocity: decreased   General Gait Details: chair follow for safety, patient weak and requested to adrupt seated break.    Stairs             Wheelchair Mobility    Modified Rankin (Stroke Patients Only)       Balance Overall balance assessment: Needs assistance Sitting-balance support: No upper extremity  supported;Feet supported Sitting balance-Leahy Scale: Fair     Standing balance support: Bilateral upper extremity supported Standing balance-Leahy Scale: Poor Standing balance comment: reliant on bil UE support in standing and with gait                            Cognition Arousal/Alertness: Awake/alert Behavior During Therapy: WFL for tasks assessed/performed Overall Cognitive Status: Within Functional Limits for tasks assessed                                 General Comments: Pt generally flat expression and is tired of being in the hospital. He is agreeable to therapy and laughing with therapist after a little while.      Exercises      General Comments        Pertinent Vitals/Pain Pain Assessment: No/denies pain    Home Living                      Prior Function            PT Goals (current goals can now be found in the care plan section) Acute Rehab PT Goals Patient Stated Goal: be able to move PT Goal Formulation: With patient Time For Goal Achievement: 02/17/18 Potential to Achieve Goals: Fair Progress towards PT goals: Progressing toward goals    Frequency    Min 3X/week      PT Plan Current plan remains appropriate    Co-evaluation  AM-PAC PT "6 Clicks" Daily Activity  Outcome Measure  Difficulty turning over in bed (including adjusting bedclothes, sheets and blankets)?: A Little Difficulty moving from lying on back to sitting on the side of the bed? : A Little Difficulty sitting down on and standing up from a chair with arms (e.g., wheelchair, bedside commode, etc,.)?: A Little Help needed moving to and from a bed to chair (including a wheelchair)?: A Little Help needed walking in hospital room?: A Little Help needed climbing 3-5 steps with a railing? : A Lot 6 Click Score: 17    End of Session Equipment Utilized During Treatment: Gait belt;Oxygen(2L) Activity Tolerance: Patient tolerated  treatment well Patient left: in bed;with call bell/phone within reach;Other (comment) Nurse Communication: Mobility status(vitals) PT Visit Diagnosis: Other abnormalities of gait and mobility (R26.89);Muscle weakness (generalized) (M62.81)     Time: 5701-7793 PT Time Calculation (min) (ACUTE ONLY): 25 min  Charges:  $Gait Training: 8-22 mins                    G Codes:       Reinaldo Berber, PT, DPT Acute Rehab Services Pager: [phone]     Reinaldo Berber 02/05/2018, 3:37 PM

## 2018-02-05 NOTE — Clinical Social Work Placement (Signed)
   CLINICAL SOCIAL WORK PLACEMENT  NOTE  Date:  02/05/2018  Patient Details  Name: Jose Baldwin MRN: [id number] Date of Birth: 05-21-1929  Clinical Social Work is seeking post-discharge placement for this patient at the [place] level of care (*CSW will initial, date and re-position this form in  chart as items are completed):  Yes   Patient/family provided with [place] Work Department's list of facilities offering this level of care within the geographic area requested by the patient (or if unable, by the patient's family).  Yes   Patient/family informed of their freedom to choose among providers that offer the needed level of care, that participate in Medicare, Medicaid or managed care program needed by the patient, have an available bed and are willing to accept the patient.  Yes   Patient/family informed of [place]'s ownership interest in [facility] and [facility], as well as of the fact that they are under no obligation to receive care at these facilities.  PASRR submitted to EDS on 02/05/18     PASRR number received on 02/05/18     Existing PASRR number confirmed on       FL2 transmitted to all facilities in geographic area requested by pt/family on 02/05/18     FL2 transmitted to all facilities within larger geographic area on       Patient informed that his/her managed care company has contracts with or will negotiate with certain facilities, including the following:            Patient/family informed of bed offers received.  Patient chooses bed at       Physician recommends and patient chooses bed at      Patient to be transferred to   on  .  Patient to be transferred to facility by       Patient family notified on   of transfer.  Name of family member notified:        PHYSICIAN Please sign FL2     Additional Comment:    _______________________________________________ Candie Chroman, LCSW 02/05/2018, 12:02  PM

## 2018-02-05 NOTE — Clinical Social Work Note (Signed)
Clinical Social Work Assessment  Patient Details  Name: Jose Baldwin MRN: [id number] Date of Birth: [birth year]  Date of referral:  02/05/18               Reason for consult:  Facility Placement, Discharge Planning                Permission sought to share information with:  Chartered certified accountant granted to share information::  Yes, Verbal Permission Granted  Name::        Agency::  SNF's  Relationship::     Contact Information:     Housing/Transportation Living arrangements for the past 2 months:  Single Family Home Source of Information:  Patient, Medical Team Patient Interpreter Needed:  None Criminal Activity/Legal Involvement Pertinent to Current Situation/Hospitalization:  No - Comment as needed Significant Relationships:  Adult Children Lives with:  Self Do you feel safe going back to the place where you live?  Yes Need for family participation in patient care:  Yes (Comment)  Care giving concerns:  PT recommending SNF once medically stable for discharge.   Social Worker assessment / plan:  CSW met with patient. No supports at bedside. CSW introduced role and explained that PT recommendations would be discussed. Patient initially stated he wanted to go home at discharge. CSW relayed conversation RN said patient and his son had about SNF and that he is now agreeable. Patient confirmed. First preference SNF is [facility]. CSW received consult that other two preferences are [facility] and [facility]. CSW notified [facility] admissions coordinator and she will review referral. No further concerns. CSW encouraged patient to contact CSW as needed. CSW will continue to follow patient for support and facilitate discharge to SNF once medically stable.  Employment status:  Retired Forensic scientist:  Medicare PT Recommendations:  [place] / Referral to community resources:  [place]  Patient/Family's Response to care:  Patient agreeable to SNF placement. Patient's son supportive and involved in patient's care. Patient appreciated social work intervention.  Patient/Family's Understanding of and Emotional Response to Diagnosis, Current Treatment, and Prognosis:  Patient has a good understanding of the reason for admission and his need for rehab prior to returning home. Patient appears happy with hospital care.  Emotional Assessment Appearance:  Appears stated age Attitude/Demeanor/Rapport:  Engaged, Gracious Affect (typically observed):  Accepting, Appropriate, Calm, Pleasant Orientation:  Oriented to Self, Oriented to Place, Oriented to  Time, Oriented to Situation Alcohol / Substance use:  Never Used Psych involvement (Current and /or in the community):  No (Comment)  Discharge Needs  Concerns to be addressed:  Care Coordination Readmission within the last 30 days:  No Current discharge risk:  Dependent with Mobility, Lives alone Barriers to Discharge:  Continued Medical Work up   Candie Chroman, LCSW 02/05/2018, 11:58 AM

## 2018-02-06 LAB — BASIC METABOLIC PANEL
Anion gap: 6 (ref 5–15)
BUN: 23 mg/dL — ABNORMAL HIGH (ref 6–20)
CO2: 28 mmol/L (ref 22–32)
Calcium: 8.4 mg/dL — ABNORMAL LOW (ref 8.9–10.3)
Chloride: 103 mmol/L (ref 101–111)
Creatinine, Ser: 1.17 mg/dL (ref 0.61–1.24)
GFR calc Af Amer: >60 mL/min (ref >60)
GFR calc non Af Amer: 54 mL/min — ABNORMAL LOW (ref >60)
Glucose, Bld: 101 mg/dL — ABNORMAL HIGH (ref 65–99)
Potassium: 4.1 mmol/L (ref 3.5–5.1)
Sodium: 137 mmol/L (ref 135–145)

## 2018-02-06 LAB — CBC
HCT: 27 % — ABNORMAL LOW (ref 39.0–52.0)
Hemoglobin: 8.7 g/dL — ABNORMAL LOW (ref 13.0–17.0)
MCH: 30.4 pg (ref 26.0–34.0)
MCHC: 32.2 g/dL (ref 30.0–36.0)
MCV: 94.4 fL (ref 78.0–100.0)
Platelets: 230 10*3/uL (ref 150–400)
RBC: 2.86 MIL/uL — ABNORMAL LOW (ref 4.22–5.81)
RDW: 13.5 % (ref 11.5–15.5)
WBC: 9.5 10*3/uL (ref 4.0–10.5)

## 2018-02-06 LAB — GLUCOSE, CAPILLARY: GLUCOSE-CAPILLARY: 92 mg/dL (ref 65–99)

## 2018-02-06 MED ORDER — SENNA 8.6 MG PO TABS
2.0000 | ORAL_TABLET | Freq: Every day | ORAL | Status: DC
  Administered 2018-02-06: 17.2 mg via ORAL
  Filled 2018-02-06: qty 2

## 2018-02-06 MED ORDER — ENSURE ENLIVE PO LIQD: 237.0000 mL | Freq: Three times a day (TID) | ORAL | 12 refills | Status: DC

## 2018-02-06 MED ORDER — POLYETHYLENE GLYCOL 3350 17 G PO PACK
17.0000 g | PACK | Freq: Every day | ORAL | Status: DC
  Administered 2018-02-06: 17 g via ORAL

## 2018-02-06 MED ORDER — MIDODRINE HCL 10 MG PO TABS: 10.0000 mg | ORAL_TABLET | Freq: Three times a day (TID) | ORAL | 0 refills | Status: DC

## 2018-02-06 MED ORDER — FERROUS SULFATE 325 (65 FE) MG PO TABS
325.0000 mg | ORAL_TABLET | Freq: Every day | ORAL | Status: DC
  Administered 2018-02-06: 325 mg via ORAL
  Filled 2018-02-06: qty 1

## 2018-02-06 MED ORDER — SENNOSIDES-DOCUSATE SODIUM 8.6-50 MG PO TABS: 1.0000 | ORAL_TABLET | Freq: Every evening | ORAL | 0 refills | Status: DC | PRN

## 2018-02-06 MED ORDER — FERROUS SULFATE 325 (65 FE) MG PO TABS: 325.0000 mg | ORAL_TABLET | Freq: Every day | ORAL | 3 refills | Status: DC

## 2018-02-06 MED ORDER — POLYETHYLENE GLYCOL 3350 17 G PO PACK
17.0000 g | PACK | Freq: Every day | ORAL | 0 refills | Status: DC
Start: 2018-02-07 — End: 2018-02-19

## 2018-02-06 MED ORDER — MIDODRINE HCL 5 MG PO TABS
10.0000 mg | ORAL_TABLET | Freq: Three times a day (TID) | ORAL | Status: DC
  Administered 2018-02-06 (×2): 10 mg via ORAL
  Filled 2018-02-06 (×2): qty 2

## 2018-02-06 NOTE — Progress Notes (Addendum)
Physical Therapy Treatment Patient Details Name: Jose Baldwin MRN: [id number] DOB: 07-08-1929 Today's Date: 02/06/2018    History of Present Illness Jose Baldwin is an 84 yo white male c PMHx: HTN, PrCA, thoracic compression fractures, and recurrent syncopal episodes x6M with falls. Family reports syncopal episodes appear to be increasing in frequency.  Pt here on 5/17 s/p syncope adn fall with multiple facial fractures.     PT Comments    Pt asleep, supine in bed upon entry, awakens easily.  Pt's gown and chuck pad wet with urine. Pt assisted with fresh linen and new gown. Pt continues to require very minimal assistance for transfers and AMB, supervision for bed mobility for safety. BP continues to remain orthostatic. AMB is not progressed, but emphasis on repeated sit to stand transfers is performed with good tolerance overall. Pt denies syncopal prodrome associated with vitals.  Will continue to follow acutely.    02/06/18 1220  Orthostatic Lying   BP- Lying 157/63  Pulse- Lying 75  Orthostatic Sitting  BP- Sitting 121/52  Pulse- Sitting 77  Orthostatic Standing at 0 minutes  BP- Standing at 0 minutes (!) 85/46  Pulse- Standing at 0 minutes 87  Orthostatic Standing at 3 minutes  BP- Standing at 3 minutes 94/71      Follow Up Recommendations  SNF;Supervision/Assistance - 24 hour     Equipment Recommendations  None recommended by PT    Recommendations for Other Services       Precautions / Restrictions Precautions Precautions: Fall Precaution Comments: check BP . TED hose and abdominal binder in room Restrictions Weight Bearing Restrictions: No    Mobility  Bed Mobility Overal bed mobility: Modified Independent Bed Mobility: Supine to Sit     Supine to sit: Supervision        Transfers Overall transfer level: Needs assistance Equipment used: Rolling walker (2 wheeled) Transfers: Sit to/from Stand Sit to Stand: Min guard;Min assist         General  transfer comment: MinA with RW, supervision with chair arms; requires minA latter half of 1x8 STS   Ambulation/Gait Ambulation/Gait assistance: Min guard Ambulation Distance (Feet): 40 Feet Assistive device: Rolling walker (2 wheeled) Gait Pattern/deviations: Step-through pattern;Decreased stride length;Trunk flexed     General Gait Details: distance not progressed d/t persistently low BP    Stairs             Wheelchair Mobility    Modified Rankin (Stroke Patients Only)       Balance     Sitting balance-Leahy Scale: Good     Standing balance support: No upper extremity supported;During functional activity Standing balance-Leahy Scale: Good                              Cognition Arousal/Alertness: Awake/alert Behavior During Therapy: WFL for tasks assessed/performed Overall Cognitive Status: Within Functional Limits for tasks assessed                                 General Comments: Generally flat affect, wants to go home.       Exercises Other Exercises Other Exercises: STS (chair squats) 1x8 (intermittent minA)     General Comments        Pertinent Vitals/Pain Pain Assessment: No/denies pain    Home Living  Prior Function            PT Goals (current goals can now be found in the care plan section) Acute Rehab PT Goals Patient Stated Goal: be able to move PT Goal Formulation: With patient Time For Goal Achievement: 02/17/18 Potential to Achieve Goals: Fair Progress towards PT goals: Progressing toward goals    Frequency    Min 3X/week      PT Plan Current plan remains appropriate    Co-evaluation              AM-PAC PT "6 Clicks" Daily Activity  Outcome Measure  Difficulty turning over in bed (including adjusting bedclothes, sheets and blankets)?: A Little Difficulty moving from lying on back to sitting on the side of the bed? : A Little Difficulty sitting down on  and standing up from a chair with arms (e.g., wheelchair, bedside commode, etc,.)?: A Little Help needed moving to and from a bed to chair (including a wheelchair)?: A Little Help needed walking in hospital room?: A Little Help needed climbing 3-5 steps with a railing? : A Lot 6 Click Score: 17    End of Session Equipment Utilized During Treatment: Gait belt Activity Tolerance: Patient tolerated treatment well;Treatment limited secondary to medical complications (Comment)(BP remains orthostatic) Patient left: with call bell/phone within reach;in chair;with chair alarm set Nurse Communication: Mobility status PT Visit Diagnosis: Other abnormalities of gait and mobility (R26.89);Muscle weakness (generalized) (M62.81)     Time: 9379-0240 PT Time Calculation (min) (ACUTE ONLY): 24 min  Charges:  $Therapeutic Activity: 23-37 mins                    G Codes:       12:33 PM, 02/22/2018 Etta Grandchild, PT, DPT Physical Therapist - [place] [phone] (Pager)  [phone] (Office)     Buccola,Allan C February 22, 2018, 12:31 PM

## 2018-02-06 NOTE — Care Management Important Message (Signed)
Important Message  Patient Details  Name: ERCIL CASSIS MRN: [id number] Date of Birth: [birth year]   Medicare Important Message Given:  Yes    Erenest Rasher, RN 02/06/2018, 1:50 PM

## 2018-02-06 NOTE — Care Management Important Message (Signed)
Important Message  Patient Details  Name: Jose Baldwin MRN: [id number] Date of Birth: 09-Apr-1929   Medicare Important Message Given:  Yes    Beverlyn Mcginness 02/06/2018, 3:11 PM

## 2018-02-06 NOTE — Plan of Care (Signed)

## 2018-02-06 NOTE — Progress Notes (Signed)
   Subjective:   No acute events overnight. Patient denies dizziness and lightheadedness with standing despite persistent orthostatic hypotension. Denies shortness of breath. CXR revealed multiple rib fractures, patient denies any history or knowledge of rib fractures. Has not had a bowel movement since admission. Urinating without any diffuculty. Still amenable to SNF placement.   Objective:  Vital signs in last 24 hours: Vitals:   02/05/18 0514 02/05/18 1149 02/05/18 2000 02/06/18 0457  BP: (!) 155/85 (!) 149/65 (!) 170/87 (!) 164/74  Pulse: (!) 101 83 77 81  Resp: 16 18 16 18   Temp: 98.3 F (36.8 C) 98.6 F (37 C) 98.4 F (36.9 C) 98.5 F (36.9 C)  TempSrc: Oral Oral Oral Oral  SpO2: 94% 91% 96% 98%  Weight: 123 lb 14.4 oz (56.2 kg)   135 lb (61.2 kg)  Height:       General: Thin, chronically ill appearing, laying in bed NAD HEENT: [place], right periorbital ecchymosis in various stages of healing, EOMI, no scleral icterus Cardiac: RRR, No R/M/G appreciated Pulm: laying flat comfortably, normal effort, CTAB Abd: abdominal binder on, non tender, non distended, BS normal Ext: ted hose on, no peripheral edema Neuro: alert and oriented X3, cranial nerves II-XII grossly intact   Assessment/Plan:  Principal Problem:   Syncope Active Problems:   HTN (hypertension)   Prostate cancer (HCC)   Orthostatic hypotension   Iron deficiency anemia  Recurrent Syncope secondary to orthostatic hypotension Orthostatic vital signs have consistently been positive, but the patient has been asymptomatic. He has been fluid resuscitated with approximately 4.5 liters of IVF and remains orthostatic. Likely autonomic dysfunction playing a role. Echo demonstrated normal systolic function with EF 60-65%, G1DD. No valvular or structural abnormalities that may be contributing to orthostatic hypotension and syncope. Added midodrine. -Will repeat orthostatics now that patient has received AM  midodrine -Continue use of TED hose and abdominal binder -Will start midodrine 10 mg TID  -Holding additional IVF -Continue Cardiac monitoring  -Continue PT/OT  -D/c SNF when  bed available, appreciate social works assistance  -Cardiology to arrange event monitor and will f/u with patient once discharged   Intermittent hypoxia and new oxygen requirement Patient asymptomatic. CXR with left layering pleural effusion and left basilar atelectasis likely contributing to hypoxia. Stable at rest on RA. O2 sats in high 90s.  -Hold IVF -Incentive spirometry -ambulatory pulse ox today   Constipation -Bowel regimen added: Senokot and Miralax daily   Normocytic Anemia Stable. Retic inappropriately normal. Baseline hemoglobin ~10-12. FOBT positive on admission. Patient denies changes in bowel habits, melena, or BRBPR. No history of abnormal colonoscopy. Ferritin and B12 within normal limits.  -Start PO iron supplementation -Will continue to trend with daily CBC -Will need outpatient GI referral, will place at discharge   Hx of thoracic vertebral compression fractures.  Hx of Prostate Cancer  Most recent PSA 0.05 05/2017. Patient recently underwent IR guided T12 kyphoplasty/vertebroplasty with bone biopsy on 5/24. Biopsy with no evidence of malignancy.  -pain management with tylenol PRN  DVT ppx: SCDs  Code Status: Full code, confirmed on admission   Dispo: Anticipated discharge pending SNF placement.   Melanee Spry, MD 02/06/2018, 9:00 AM Pager: [phone]

## 2018-02-06 NOTE — Clinical Social Work Placement (Signed)
   CLINICAL SOCIAL WORK PLACEMENT  NOTE  Date:  02/06/2018  Patient Details  Name: Jose Baldwin MRN: [id number] Date of Birth: Dec 10, 1928  Clinical Social Work is seeking post-discharge placement for this patient at the [place] level of care (*CSW will initial, date and re-position this form in  chart as items are completed):  Yes   Patient/family provided with [place] Work Department's list of facilities offering this level of care within the geographic area requested by the patient (or if unable, by the patient's family).  Yes   Patient/family informed of their freedom to choose among providers that offer the needed level of care, that participate in Medicare, Medicaid or managed care program needed by the patient, have an available bed and are willing to accept the patient.  Yes   Patient/family informed of 's ownership interest in [facility] and [facility], as well as of the fact that they are under no obligation to receive care at these facilities.  PASRR submitted to EDS on 02/05/18     PASRR number received on 02/05/18     Existing PASRR number confirmed on       FL2 transmitted to all facilities in geographic area requested by pt/family on 02/05/18     FL2 transmitted to all facilities within larger geographic area on       Patient informed that his/her managed care company has contracts with or will negotiate with certain facilities, including the following:        Yes   Patient/family informed of bed offers received.  Patient chooses bed at [facility] [place]     Physician recommends and patient chooses bed at      Patient to be transferred to [facility] [place] on 02/06/18.  Patient to be transferred to facility by Son will transport by car     Patient family notified on 02/06/18 of transfer.  Name of family member notified:  Daren Vales     PHYSICIAN Please prepare prescriptions      Additional Comment:    _______________________________________________ Candie Chroman, LCSW 02/06/2018, 1:59 PM

## 2018-02-06 NOTE — Clinical Social Work Note (Signed)
CSW facilitated patient discharge including contacting patient family and facility to confirm patient discharge plans. Clinical information faxed to facility and family agreeable with plan. Patient's son will transport by car to [place] around 3:00 pm. RN to call report prior to discharge [phone] Room 101A).  CSW will sign off for now as social work intervention is no longer needed. Please consult [place] again if new needs arise.  Dayton Scrape, [place]

## 2018-02-10 ENCOUNTER — Encounter: Payer: Self-pay | Admitting: Cardiology

## 2018-02-10 ENCOUNTER — Telehealth: Payer: Self-pay | Admitting: Cardiology

## 2018-02-10 NOTE — Telephone Encounter (Signed)
New Message:        Pt c/o BP issue: STAT if pt c/o blurred vision, one-sided weakness or slurred speech  1. What are your last 5 BP readings?  158/64 lying in bed 112/44 sitting on the side of bed 40/ won't read bottom number when standing. 2. Are you having any other symptoms (ex. Dizziness, headache, blurred vision, passed out)?  3. What is your BP issue? Blood pressure is dropping when standing

## 2018-02-10 NOTE — Telephone Encounter (Signed)
Spoke with Nurse from [facility] who states pt has been experiencing orthostatic BP. She reports pt cant stand for more than a minute and starts feeling dizzy. She states yesterday when he stood up he ended up falling. She reports today while sitting his BP was 112/44 and standing the top number was 40. Nurse advised to have pt sent to the ED for further evaluations. Verbalized understanding.

## 2018-02-10 NOTE — Telephone Encounter (Signed)
Pt's son sent a message via Mychart informing that pt fainted yesterday. Returned call to son and informed that I had spoken to someone from the facility and advised to go to the emergency room due to low BP reading. Son states he wasn't aware of the recommendation or if pt had been to the sent to ED.   Spoke with Estill Bamberg from the facility who states they have contacted the facility Doctor and awaiting orders for pt. Son updated with status.

## 2018-02-11 ENCOUNTER — Telehealth: Payer: Self-pay | Admitting: Cardiology

## 2018-02-11 NOTE — Telephone Encounter (Signed)
02-11-18 Spoke with Mr. Jose Baldwin son - [name] to get his father  reschedule for the Heart Monitor - that he no showed on 02-06-18. Question as to do he still need the monitor.   Also he is concern regarding his father bp and he fainted on yesterday. Spoke with the nursing home and  sent a my chart message to the office. It was suggested from Dr. Percival Spanish that my father be sent to the ED.  I was told by the facility that they had contacted the  Dr. Inda Merlin with the recommendation.  Dr. Inda Merlin was looking at his bp and reviewing the chart on yesterday.  I have not talk with him to see what the next step will be.  I just wanted both doctors to be aware other next step for my father.

## 2018-02-11 NOTE — Telephone Encounter (Signed)
See previous encounter

## 2018-02-11 NOTE — Telephone Encounter (Signed)
Spoke with Pt's son who informed that he would like to add Dr. Josetta Huddle as apart of his father's care team. He informed that he is currently assessing pt and they are awaiting recommendation to treat pt low BP pending lab results. Son informed to have facility fax over lab work and any medication changes to our office. Routing encounter to Dr. Warren Lacy so he is made aware.

## 2018-02-12 ENCOUNTER — Encounter: Payer: Self-pay | Admitting: Cardiology

## 2018-02-12 ENCOUNTER — Telehealth: Payer: Self-pay | Admitting: Internal Medicine

## 2018-02-12 NOTE — Telephone Encounter (Signed)
Records received from [facility] and placed on Dr. Celesta Aver desk for review (DOD 02/11/18)  Attending Provider: Dr. Marcellus Scott  [phone]

## 2018-02-13 ENCOUNTER — Encounter: Payer: Self-pay | Admitting: Cardiology

## 2018-02-13 NOTE — Telephone Encounter (Signed)
I reviewed his chart  - recent syncope admit  Hgb 8 Ferritin 90+ and NL MCV Did have heme + stool so ? Of GI w/u there  Needs OV with GI - I am so booked up he should see an APP or other MD with opening unless I have one in next couple of weeks that I am not aware og  Papers on your desk

## 2018-02-14 ENCOUNTER — Encounter: Payer: Self-pay | Admitting: Cardiology

## 2018-02-14 NOTE — Telephone Encounter (Signed)
Pt scheduled to see Tye Savoy NP 02/19/18@3pm , please notify pt/office regarding appt.

## 2018-02-19 ENCOUNTER — Encounter: Payer: Self-pay | Admitting: Nurse Practitioner

## 2018-02-19 ENCOUNTER — Other Ambulatory Visit (INDEPENDENT_AMBULATORY_CARE_PROVIDER_SITE_OTHER): Payer: Medicare Other

## 2018-02-19 ENCOUNTER — Ambulatory Visit (INDEPENDENT_AMBULATORY_CARE_PROVIDER_SITE_OTHER): Payer: Medicare Other | Admitting: Nurse Practitioner

## 2018-02-19 VITALS — BP 136/60 | HR 68 | Ht 75.0 in | Wt 128.4 lb

## 2018-02-19 DIAGNOSIS — R710 Precipitous drop in hematocrit: Secondary | ICD-10-CM | POA: Diagnosis not present

## 2018-02-19 DIAGNOSIS — D649 Anemia, unspecified: Secondary | ICD-10-CM

## 2018-02-19 LAB — CBC
HEMATOCRIT: 31.1 % — AB (ref 39.0–52.0)
Hemoglobin: 10.7 g/dL — ABNORMAL LOW (ref 13.0–17.0)
MCHC: 34.4 g/dL (ref 30.0–36.0)
MCV: 91.8 fl (ref 78.0–100.0)
PLATELETS: 294 10*3/uL (ref 150.0–400.0)
RBC: 3.39 Mil/uL — ABNORMAL LOW (ref 4.22–5.81)
RDW: 13.7 % (ref 11.5–15.5)
WBC: 4.8 10*3/uL (ref 4.0–10.5)

## 2018-02-19 MED ORDER — OMEPRAZOLE 40 MG PO CPDR: 40.0000 mg | DELAYED_RELEASE_CAPSULE | ORAL | 1 refills | Status: DC

## 2018-02-19 NOTE — Patient Instructions (Signed)
If you are age 82 or older, your body mass index should be between 23-30. Your Body mass index is 16.05 kg/m. If this is out of the aforementioned range listed, please consider follow up with your Primary Care Provider.  If you are age 15 or younger, your body mass index should be between 19-25. Your Body mass index is 16.05 kg/m. If this is out of the aformentioned range listed, please consider follow up with your Primary Care Provider.   Your provider has requested that you go to the basement level for lab work before leaving today. Press "B" on the elevator. The lab is located at the first door on the left as you exit the elevator. CBC  We have sent the following medications to your pharmacy for you to pick up at your convenience: Omeprazole   NO Ibuprofen or Advil.  I will call son with lab results.  Thank you for choosing me and [facility].   Tye Savoy, NP

## 2018-02-19 NOTE — Telephone Encounter (Signed)
Left message on patient voicemail for him to call back and verify appointment for today or reschedule to a different date. Records sent back to [facility].

## 2018-02-19 NOTE — Progress Notes (Addendum)
Chief Complaint: anemia  Referring Provider:  Marcellus Scott, MD       ASSESSMENT AND PLAN;   84 yo male with 4 gram drop in hgb from 12.6 to mid 8 range between mid and late May. Ferritin normal at 96. No overt GI bleeding but heme positive twice in ED. Up until recently patient had been taking daily asa as well as daily NSAIDs for pain.  -Exact cause(s) of anemia unknown. Recent falls at home with facial fractures/ skin tears and is s/p recent vertebroplasty though I don't know how much those things would have contributed to the anemia.  -Discussed options with son and patient and we all would like to invasive workup. Given advanced age, debilitation I think he is at increased risk for endoscopic evaluation.  -Repeated hemoccult today and negative. Will repeat CBC today to make sure hgb stable on iron .  -Add PPI  -No NSAIDS -Will call son with lab results and go from there.   Agree with Ms. Guenther's assessment and plan.  Recheck Hgb was 10.7 Undoubtedly has a component of or all related to anemia of chronic disease F/U GI prn Gatha Mayer, MD, [facility]   HPI:    Patient is an 84 yo male referred by Dr. Inda Merlin with [facility] for evaluation of anemia and heme positive stool.  Patient has been having falls / syncopal episodes at home.  He sustained a T12 compression fracture in January and in mid May he was seen in ED after another syncopal episode / fall resulting in facial fractures.  Hgb in ED was 12.6. On 5/24 patient underwent vertebroplasty with biopsy. Then on 02/02/18 patient went back to ED with near syncope, found to be hypotensive with BP of 67/41. Hgb was 10.0 in ED at that time. Given IVF for BP, hgb dropped in mid 8 range.    Patient denies overt GI bleeding. No abdominal pain, N/V. No blood thinners. He had been taking  baby asa for 20 years until just recently. He was taking ibuprofen TID for several weeks for arm and back pain from the falls. Patient  stopped the NSAIDS himself after mid May fall.     Past Medical History:  Diagnosis Date  . Hypertension   . Prostate cancer ([place])   . Syncope      Past Surgical History:  Procedure Laterality Date  . BACK SURGERY    . IR VERTEBROPLASTY CERV/THOR BX INC UNI/BIL INC/INJECT/IMAGING  01/31/2018   Family History  Problem Relation Age of Onset  . Heart attack Father 56   Social History   Tobacco Use  . Smoking status: Former Smoker    Packs/day: 2.00    Years: 30.00    Pack years: 60.00    Types: Cigarettes    Last attempt to quit: 1990    Years since quitting: 29.4  . Smokeless tobacco: Current User    Types: Chew  Substance Use Topics  . Alcohol use: Yes    Alcohol/week: 8.4 oz    Types: 14 Cans of beer per week  . Drug use: Never   Current Outpatient Medications  Medication Sig Dispense Refill  . feeding supplement, ENSURE ENLIVE, (ENSURE ENLIVE) LIQD Take 237 mLs by mouth 3 (three) times daily between meals. 237 mL 12  . ferrous sulfate 325 (65 FE) MG tablet Take 1 tablet (325 mg total) by mouth daily with breakfast.  3  . fludrocortisone (FLORINEF) 0.1 MG tablet Take 0.1  mg by mouth daily.    . midodrine (PROAMATINE) 10 MG tablet Take 1 tablet (10 mg total) by mouth 3 (three) times daily with meals. 30 tablet 0  . Multiple Vitamin (MULTI-VITAMINS) TABS Take 1 tablet by mouth daily.     . ranibizumab (LUCENTIS) 0.5 MG/0.05ML SOLN 0.5 mg by Intravitreal route once. Every 8 weeks     No current facility-administered medications for this visit.    No Known Allergies   Review of Systems: All systems reviewed and negative except where noted in HPI.   Serum creatinine: 1.17 mg/dL 02/06/18 0449 Estimated creatinine clearance: 36.6 mL/min   Physical Exam:    Wt Readings from Last 3 Encounters:  02/19/18 128 lb 6 oz (58.2 kg)  02/06/18 135 lb (61.2 kg)  01/31/18 140 lb (63.5 kg)    BP 136/60   Pulse 68   Ht 6\' 3"  (1.905 m)   Wt 128 lb 6 oz (58.2 kg)   BMI  16.05 kg/m  Constitutional:  Pleasant male in no acute distress. Psychiatric: Normal mood and affect. Behavior is normal. EENT: Pupils normal.  Conjunctivae are normal. No scleral icterus. Neck supple.  Cardiovascular: Normal rate, regular rhythm. No edema Pulmonary/chest: Effort normal and breath sounds normal. No wheezing, rales or rhonchi. Abdominal: Soft, nondistended, nontender. Bowel sounds active throughout. There are no masses palpable. No hepatomegaly. Rectal: stool greenish brown, heme negative Neurological: Alert and oriented to person place and time. Skin: Skin is warm and dry. No rashes noted.  Tye Savoy, NP  02/19/2018, 3:15 PM  Cc: Josetta Huddle, MD

## 2018-02-21 ENCOUNTER — Encounter: Payer: Self-pay | Admitting: Nurse Practitioner

## 2018-02-25 ENCOUNTER — Encounter: Payer: Self-pay | Admitting: Nurse Practitioner

## 2018-03-03 NOTE — Progress Notes (Signed)
Cardiology Office Note   Date:  03/04/2018   ID:  Jose Baldwin, DOB 10-Aug-1929, MRN [id number]  PCP:  Jose Huddle, MD  Cardiologist:   Jose Breeding, MD    Chief Complaint  Patient presents with  . Loss of Consciousness      History of Present Illness: Jose Baldwin is a 84 y.o. male who presents for follow up of syncope. He was in the hospital recently with this.   He was found to be orthostatic.  He was treated with Midodrine and TED hose.  Echo at the time of that admission was essentially unremarkable.   He was to have an event monitor but he did not have this placed.  Metoprolol and Noravsc were stopped.     He went to a rehab center and he was placed on Florinef.  Since then he has also had salt liberalized in his diet and with this and his meds he has done well.  The patient denies any new symptoms such as chest discomfort, neck or arm discomfort. There has been no new shortness of breath, PND or orthopnea. There have been no reported palpitations, presyncope or syncope.   He is walking with a walker and he is weak but getting slowly better.    Past Medical History:  Diagnosis Date  . Hypertension   . Prostate cancer ([place])   . Syncope     Past Surgical History:  Procedure Laterality Date  . BACK SURGERY    . IR VERTEBROPLASTY CERV/THOR BX INC UNI/BIL INC/INJECT/IMAGING  01/31/2018     Current Outpatient Medications  Medication Sig Dispense Refill  . feeding supplement, ENSURE ENLIVE, (ENSURE ENLIVE) LIQD Take 237 mLs by mouth 3 (three) times daily between meals. 237 mL 12  . ferrous sulfate 325 (65 FE) MG tablet Take 1 tablet (325 mg total) by mouth daily with breakfast.  3  . fludrocortisone (FLORINEF) 0.1 MG tablet Take 0.1 mg by mouth daily.    . midodrine (PROAMATINE) 10 MG tablet Take 1 tablet (10 mg total) by mouth 3 (three) times daily with meals. 30 tablet 0  . Multiple Vitamin (MULTI-VITAMINS) TABS Take 1 tablet by mouth daily.     Marland Kitchen omeprazole  (PRILOSEC) 40 MG capsule Take 1 capsule (40 mg total) by mouth every morning. Take one 30 minutes before breakfast 30 capsule 1  . ranibizumab (LUCENTIS) 0.5 MG/0.05ML SOLN 0.5 mg by Intravitreal route once. Every 8 weeks     No current facility-administered medications for this visit.     Allergies:   Patient has no known allergies.    Social History:  The patient  reports that he quit smoking about 29 years ago. His smoking use included cigarettes. He has a 60.00 pack-year smoking history. His smokeless tobacco use includes chew. He reports that he drinks about 8.4 oz of alcohol per week. He reports that he does not use drugs.   Family History:  The patient's family history includes Heart attack (age of onset: 64) in his father.    ROS:  Please see the history of present illness.   Otherwise, review of systems are positive for none.   All other systems are reviewed and negative.    PHYSICAL EXAM: VS:  BP (!) 147/66   Pulse 77   Ht 6\' 3"  (1.905 m)   Wt 134 lb 9.6 oz (61.1 kg)   BMI 16.82 kg/m  , BMI Body mass index is 16.82 kg/m. GENERAL:  Slightly frail appearing HEENT:  Pupils equal round and reactive, fundi not visualized, oral mucosa unremarkable NECK:  No jugular venous distention, waveform within normal limits, carotid upstroke brisk and symmetric, no bruits, no thyromegaly LYMPHATICS:  No cervical, inguinal adenopathy LUNGS:  Clear to auscultation bilaterally BACK:  No CVA tenderness CHEST:  Unremarkable HEART:  PMI not displaced or sustained,S1 and S2 within normal limits, no S3, no S4, no clicks, no rubs, no murmurs ABD:  Flat, positive bowel sounds normal in frequency in pitch, no bruits, no rebound, no guarding, no midline pulsatile mass, no hepatomegaly, no splenomegaly EXT:  2 plus pulses throughout, no edema, no cyanosis no clubbing SKIN:  No rashes no nodules NEURO:  Cranial nerves II through XII grossly intact, motor grossly intact throughout PSYCH:   Cognitively intact, oriented to person place and time   EKG:  EKG is not ordered today.   Recent Labs: 02/02/2018: B Natriuretic Peptide 307.6; Magnesium 1.9; TSH 1.562 02/06/2018: BUN 23; Creatinine, Ser 1.17; Potassium 4.1; Sodium 137 02/19/2018: Hemoglobin 10.7; Platelets 294.0    Lipid Panel No results found for: CHOL, TRIG, HDL, CHOLHDL, VLDL, LDLCALC, LDLDIRECT    Wt Readings from Last 3 Encounters:  03/04/18 134 lb 9.6 oz (61.1 kg)  02/19/18 128 lb 6 oz (58.2 kg)  02/06/18 135 lb (61.2 kg)      Other studies Reviewed: Additional studies/ records that were reviewed today include: Hospital records. Review of the above records demonstrates:  Please see elsewhere in the note.     ASSESSMENT AND PLAN:  SYNCOPE:   He has had no further episodes.  At this point given the fact that this resolved with stopping his antihypertensives, liberalizing his salt and the medications as listed I would not think that looking for an arrhythmia is warranted so I will not order the event monitor.  HTN: Blood pressures currently slightly high but I think this is much better than being low in his situation.  I had a long discussion with the patient and his son about this.  He does understand that should he start to get short of breath or swelling or more hypertensive he would need to let me know and I would change his medications.  However, for now he seems to be on the appropriate regimen.  They understand that this might be a moving target.   Current medicines are reviewed at length with the patient today.  The patient does not have concerns regarding medicines.  The following changes have been made:  no change  Labs/ tests ordered today include: None No orders of the defined types were placed in this encounter.    Disposition:   FU with me in six months.     Signed, Jose Breeding, MD  03/04/2018 4:26 PM    [facility]

## 2018-03-04 ENCOUNTER — Ambulatory Visit (INDEPENDENT_AMBULATORY_CARE_PROVIDER_SITE_OTHER): Payer: Medicare Other | Admitting: Cardiology

## 2018-03-04 ENCOUNTER — Encounter: Payer: Self-pay | Admitting: Cardiology

## 2018-03-04 VITALS — BP 147/66 | HR 77 | Ht 75.0 in | Wt 134.6 lb

## 2018-03-04 DIAGNOSIS — I10 Essential (primary) hypertension: Secondary | ICD-10-CM | POA: Diagnosis not present

## 2018-03-04 DIAGNOSIS — R55 Syncope and collapse: Secondary | ICD-10-CM | POA: Diagnosis not present

## 2018-03-04 NOTE — Patient Instructions (Signed)
Medication Instructions: Your physician recommends that you continue on your current medications as directed. Please refer to the Current Medication list given to you today.  If you need a refill on your cardiac medications before your next appointment, please call your pharmacy.    Follow-Up: Your physician wants you to follow-up in 6 months with Dr. Percival Spanish.   Thank you for choosing [facility] at [facility]!!

## 2018-03-06 ENCOUNTER — Encounter: Payer: Self-pay | Admitting: Family Medicine

## 2018-03-06 ENCOUNTER — Ambulatory Visit (INDEPENDENT_AMBULATORY_CARE_PROVIDER_SITE_OTHER): Payer: Medicare Other | Admitting: Family Medicine

## 2018-03-06 VITALS — BP 138/70 | HR 66 | Temp 98.2°F | Ht 75.0 in | Wt 132.0 lb

## 2018-03-06 DIAGNOSIS — I951 Orthostatic hypotension: Secondary | ICD-10-CM

## 2018-03-06 DIAGNOSIS — D649 Anemia, unspecified: Secondary | ICD-10-CM

## 2018-03-06 DIAGNOSIS — Z7689 Persons encountering health services in other specified circumstances: Secondary | ICD-10-CM

## 2018-03-06 NOTE — Patient Instructions (Signed)
Anemia Anemia is a condition in which you do not have enough red blood cells or hemoglobin. Hemoglobin is a substance in red blood cells that carries oxygen. When you do not have enough red blood cells or hemoglobin (are anemic), your body cannot get enough oxygen and your organs may not work properly. As a result, you may feel very tired or have other problems. What are the causes? Common causes of anemia include:  Excessive bleeding. Anemia can be caused by excessive bleeding inside or outside the body, including bleeding from the intestine or from periods in women.  Poor nutrition.  Long-lasting (chronic) kidney, thyroid, and liver disease.  Bone marrow disorders.  Cancer and treatments for cancer.  HIV (human immunodeficiency virus) and AIDS (acquired immunodeficiency syndrome).  Treatments for HIV and AIDS.  Spleen problems.  Blood disorders.  Infections, medicines, and autoimmune disorders that destroy red blood cells.  What are the signs or symptoms? Symptoms of this condition include:  Minor weakness.  Dizziness.  Headache.  Feeling heartbeats that are irregular or faster than normal (palpitations).  Shortness of breath, especially with exercise.  Paleness.  Cold sensitivity.  Indigestion.  Nausea.  Difficulty sleeping.  Difficulty concentrating.  Symptoms may occur suddenly or develop slowly. If your anemia is mild, you may not have symptoms. How is this diagnosed? This condition is diagnosed based on:  Blood tests.  Your medical history.  A physical exam.  Bone marrow biopsy.  Your health care provider may also check your stool (feces) for blood and may do additional testing to look for the cause of your bleeding. You may also have other tests, including:  Imaging tests, such as a CT scan or MRI.  Endoscopy.  Colonoscopy.  How is this treated? Treatment for this condition depends on the cause. If you continue to lose a lot of blood,  you may need to be treated at a hospital. Treatment may include:  Taking supplements of iron, vitamin B12, or folic acid.  Taking a hormone medicine (erythropoietin) that can help to stimulate red blood cell growth.  Having a blood transfusion. This may be needed if you lose a lot of blood.  Making changes to your diet.  Having surgery to remove your spleen.  Follow these instructions at home:  Take over-the-counter and prescription medicines only as told by your health care provider.  Take supplements only as told by your health care provider.  Follow any diet instructions that you were given.  Keep all follow-up visits as told by your health care provider. This is important. Contact a health care provider if:  You develop new bleeding anywhere in the body. Get help right away if:  You are very weak.  You are short of breath.  You have pain in your abdomen or chest.  You are dizzy or feel faint.  You have trouble concentrating.  You have bloody or black, tarry stools.  You vomit repeatedly or you vomit up blood. Summary  Anemia is a condition in which you do not have enough red blood cells or enough of a substance in your red blood cells that carries oxygen (hemoglobin).  Symptoms may occur suddenly or develop slowly.  If your anemia is mild, you may not have symptoms.  This condition is diagnosed with blood tests as well as a medical history and physical exam. Other tests may be needed.  Treatment for this condition depends on the cause of the anemia. This information is not intended to replace advice   given to you by your health care provider. Make sure you discuss any questions you have with your health care provider. Document Released: 10/04/2004 Document Revised: 09/28/2016 Document Reviewed: 09/28/2016 Elsevier Interactive Patient Education  2018 [employer]. Hematocrit Test Why am I having this test? The hematocrit (Hct) test is a blood test to measure  the percentage of the total blood volume that is made up of red blood cells. The hematocrit value indirectly reflects the number of red blood cells. What kind of sample is taken? A blood sample is required for this test. It is usually collected by inserting a needle into a vein. How do I prepare for this test? There is no preparation required for this test. What are the reference ranges? Reference ranges are considered healthy ranges established after testing a large group of healthy people. Reference ranges may vary among different people, labs, and hospitals. It is your responsibility to obtain your test results. Ask the lab or department performing the test when and how you will get your results. The reference ranges for the Hct test are as follows:  Male: 42-52% or 0.42-0.52 volume fraction (SI units).  Nonpregnant male: 37-47% or 0.37-0.47 volume fraction (SI units).  Pregnant male: greater than 33%.  Elderly: values may be slightly decreased.  Child or adolescent: ? Newborn: 44-64%. ? 2-8 weeks: 39-59%. ? 2-6 months: 35-50%. ? 6 months-1 year: 29-43%. ? 1-6 years: 30-40%. ? 6-18 years: 32-44%.  What do the results mean? Increased levels of Hct may indicate:  An increased number of red blood cells (erythrocytosis).  A heart condition present at birth (congenital heart disease).  An increased number of red blood cells being made by the bone marrow (polycythemia vera).  Severe dehydration.  Severe COPD (chronic obstructive pulmonary disease).  Decreased levels of Hct may mean:  Anemia.  A reduced number and survival of red blood cells (hemoglobinopathy).  Scarring of the liver (cirrhosis).  Bleeding.  A deficiency in a vitamin or mineral, such as a iron deficiency.  Bone marrow failure.  Damage to red blood cells due to mechanical heart valves.  Kidney disease.  Certain autoimmune diseases, such as rheumatoid arthritis or lupus.  Certain blood-related  cancers, such as lymphoma, multiple myeloma, leukemia, or Hodgkin disease.  Talk with your health care provider to discuss your results, treatment options, and if necessary, the need for more tests. Talk with your health care provider if you have any questions about your results. Talk with your health care provider to discuss your results, treatment options, and if necessary, the need for more tests. Talk with your health care provider if you have any questions about your results. This information is not intended to replace advice given to you by your health care provider. Make sure you discuss any questions you have with your health care provider. Document Released: 09/28/2004 Document Revised: 05/02/2016 Document Reviewed: 02/01/2014 Elsevier Interactive Patient Education  2018 [employer].  Hypotension Hypotension, commonly called low blood pressure, is when the force of blood pumping through your arteries is too weak. Arteries are blood vessels that carry blood from the heart throughout the body. When blood pressure is too low, you may not get enough blood to your brain or to the rest of your organs. This can cause weakness, light-headedness, rapid heartbeat, and fainting. Depending on the cause and severity, hypotension may be harmless (benign) or cause serious problems (critical). What are the causes? Possible causes of hypotension include:  Blood loss.  Loss of body  fluids (dehydration).  Heart problems.  Hormone (endocrine) problems.  Pregnancy.  Severe infection.  Lack of certain nutrients.  Severe allergic reactions (anaphylaxis).  Certain medicines, such as blood pressure medicine or medicines that make the body lose excess fluids (diuretics). Sometimes, hypotension can be caused by not taking medicine as directed, such as taking too much of a certain medicine.  What increases the risk? Certain factors can make you more likely to develop hypotension, including:  Age.  Risk increases as you get older.  Conditions that affect the heart or the central nervous system.  Taking certain medicines, such as blood pressure medicine or diuretics.  Being pregnant.  What are the signs or symptoms? Symptoms of this condition may include:  Weakness.  Light-headedness.  Dizziness.  Blurred vision.  Fatigue.  Rapid heartbeat.  Fainting, in severe cases.  How is this diagnosed? This condition is diagnosed based on:  Your medical history.  Your symptoms.  Your blood pressure measurement. Your health care provider will check your blood pressure when you are: ? Lying down. ? Sitting. ? Standing.  A blood pressure reading is recorded as two numbers, such as "120 over 80" (or 120/80). The first ("top") number is called the systolic pressure. It is a measure of the pressure in your arteries as your heart beats. The second ("bottom") number is called the diastolic pressure. It is a measure of the pressure in your arteries when your heart relaxes between beats. Blood pressure is measured in a unit called mm Hg. Healthy blood pressure for adults is 120/80. If your blood pressure is below 90/60, you may be diagnosed with hypotension. Other information or tests that may be used to diagnose hypotension include:  Your other vital signs, such as your heart rate and temperature.  Blood tests.  Tilt table test. For this test, you will be safely secured to a table that moves you from a lying position to an upright position. Your heart rhythm and blood pressure will be monitored during the test.  How is this treated? Treatment for this condition may include:  Changing your diet. This may involve eating more salt (sodium) or drinking more water.  Taking medicines to raise your blood pressure.  Changing the dosage of certain medicines you are taking that might be lowering your blood pressure.  Wearing compression stockings. These stockings help to prevent blood  clots and reduce swelling in your legs.  In some cases, you may need to go to the hospital for:  Fluid replacement. This means you will receive fluids through an IV tube.  Blood replacement. This means you will receive donated blood through an IV tube (transfusion).  Treating an infection or heart problems, if this applies.  Monitoring. You may need to be monitored while medicines that you are taking wear off.  Follow these instructions at home: Eating and drinking   Drink enough fluid to keep your urine clear or pale yellow.  Eat a healthy diet and follow instructions from your health care provider about eating or drinking restrictions. A healthy diet includes: ? Fresh fruits and vegetables. ? Whole grains. ? Lean meats. ? Low-fat dairy products.  Eat extra salt only as directed. Do not add extra salt to your diet unless your health care provider told you to do that.  Eat frequent, small meals.  Avoid standing up suddenly after eating. Medicines  Take over-the-counter and prescription medicines only as told by your health care provider. ? Follow instructions from your health care  provider about changing the dosage of your current medicines, if this applies. ? Do not stop or adjust any of your medicines on your own. General instructions  Wear compression stockings as told by your health care provider.  Get up slowly from lying down or sitting positions. This gives your blood pressure a chance to adjust.  Avoid hot showers and excessive heat as directed by your health care provider.  Return to your normal activities as told by your health care provider. Ask your health care provider what activities are safe for you.  Do not use any products that contain nicotine or tobacco, such as cigarettes and e-cigarettes. If you need help quitting, ask your health care provider.  Keep all follow-up visits as told by your health care provider. This is important. Contact a health care  provider if:  You vomit.  You have diarrhea.  You have a fever for more than 2-3 days.  You feel more thirsty than usual.  You feel weak and tired. Get help right away if:  You have chest pain.  You have a fast or irregular heartbeat.  You develop numbness in any part of your body.  You cannot move your arms or your legs.  You have trouble speaking.  You become sweaty or feel light-headed.  You faint.  You feel short of breath.  You have trouble staying awake.  You feel confused. This information is not intended to replace advice given to you by your health care provider. Make sure you discuss any questions you have with your health care provider. Document Released: 08/27/2005 Document Revised: 03/16/2016 Document Reviewed: 02/16/2016 Elsevier Interactive Patient Education  2018 [employer].

## 2018-03-06 NOTE — Progress Notes (Signed)
Patient presents to clinic today to f/u on chronic issues and to establish care.  Pt is accompanied by his son.  SUBJECTIVE: PMH: pt is an 84yo male with pmh sig for HTN, orthostatic hypotension, prostate cancer, anemia, compression fx.  Pt was previously seen by Dr. Ellison Hughs at [facility] in [place].  Of note, pt was suppose to start Ranibizumab .5 mg Intravitreal q 8 wks, but has not.  Hypotension: -Pt endorses ongoing history of syncope and collapse 2/2 hypotension from BP meds. -no longer on meds for hypertension. -seen by cardiology, Dr. Percival Spanish -taking Midodrine 10 mg TID and fludrocortisone .1 mg daily. -not checking bp at home. -drinking 2-3 bottles of water/day, 1.5-2 cups of coffee, and a soda/day. -pt denies recent dizziness since stopping bp meds.  Anemia: -decreased Hgb, Hct, and RBCs noted x yrs. -recently started 325 mg daily -labs on 02/11/18: RBC 2.74, Hgb 8.5, Hct 25.3, MCV 92.4, plt 218 -pt denies previous anemia work up. -pt denies bleeding hx  Prostate cancer: -dx'd in 2009, adenocarcinoma -completeed IM RT radiation therapy -followed by Dr. Noreene Filbert, [place].  Thoracic compression fracture: -S/P fall -seen on MRI, T12  -pt's son requesting ok to continue h/h PT  Allergies: NKDA   Social history: Patient is accompanied by his son.  Patient is a Civil engineer, contracting by training.  Pt denies alcohol, tobacco, drug use.  Past Medical History:  Diagnosis Date  . Hypertension   . Prostate cancer ([place])   . Syncope     Past Surgical History:  Procedure Laterality Date  . BACK SURGERY    . IR VERTEBROPLASTY CERV/THOR BX INC UNI/BIL INC/INJECT/IMAGING  01/31/2018    Current Outpatient Medications on File Prior to Visit  Medication Sig Dispense Refill  . feeding supplement, ENSURE ENLIVE, (ENSURE ENLIVE) LIQD Take 237 mLs by mouth 3 (three) times daily between meals. 237 mL 12  . ferrous sulfate 325 (65 FE) MG tablet Take 1 tablet (325 mg total)  by mouth daily with breakfast.  3  . fludrocortisone (FLORINEF) 0.1 MG tablet Take 0.1 mg by mouth daily.    . midodrine (PROAMATINE) 10 MG tablet Take 1 tablet (10 mg total) by mouth 3 (three) times daily with meals. 30 tablet 0  . Multiple Vitamin (MULTI-VITAMINS) TABS Take 1 tablet by mouth daily.     . ranibizumab (LUCENTIS) 0.5 MG/0.05ML SOLN 0.5 mg by Intravitreal route once. Every 8 weeks     No current facility-administered medications on file prior to visit.     No Known Allergies  Family History  Problem Relation Age of Onset  . Heart attack Father 61    Social History   Socioeconomic History  . Marital status: Divorced    Spouse name: Not on file  . Number of children: 3  . Years of education: Not on file  . Highest education level: Not on file  Occupational History  . Occupation: retired  Scientific laboratory technician  . Financial resource strain: Not on file  . Food insecurity:    Worry: Not on file    Inability: Not on file  . Transportation needs:    Medical: Not on file    Non-medical: Not on file  Tobacco Use  . Smoking status: Former Smoker    Packs/day: 2.00    Years: 30.00    Pack years: 60.00    Types: Cigarettes    Last attempt to quit: 1990    Years since quitting: 29.5  . Smokeless  tobacco: Never Used  Substance and Sexual Activity  . Alcohol use: Yes    Alcohol/week: 8.4 oz    Types: 14 Cans of beer per week  . Drug use: Never  . Sexual activity: Not on file  Lifestyle  . Physical activity:    Days per week: Not on file    Minutes per session: Not on file  . Stress: Not on file  Relationships  . Social connections:    Talks on phone: Not on file    Gets together: Not on file    Attends religious service: Not on file    Active member of club or organization: Not on file    Attends meetings of clubs or organizations: Not on file    Relationship status: Not on file  . Intimate partner violence:    Fear of current or ex partner: Not on file     Emotionally abused: Not on file    Physically abused: Not on file    Forced sexual activity: Not on file  Other Topics Concern  . Not on file  Social History Narrative  . Not on file    ROS General: Denies fever, chills, night sweats, changes in weight, changes in appetite HEENT: Denies headaches, ear pain, changes in vision, rhinorrhea, sore throat CV: Denies CP, palpitations, SOB, orthopnea Pulm: Denies SOB, cough, wheezing GI: Denies abdominal pain, nausea, vomiting, diarrhea, constipation GU: Denies dysuria, hematuria, frequency, vaginal discharge Msk: Denies muscle cramps, joint pains Neuro: Denies weakness, numbness, tingling Skin: Denies rashes, bruising Psych: Denies depression, anxiety, hallucinations  BP 138/70 (BP Location: Left Arm, Patient Position: Sitting, Cuff Size: Normal)   Pulse 66   Temp 98.2 F (36.8 C) (Oral)   Ht 6' 3" (1.905 m)   Wt 132 lb (59.9 kg)   SpO2 94%   BMI 16.50 kg/m   Physical Exam Gen. Pleasant, well developed, well-nourished, in NAD HEENT - [place]/AT, PERRL, no scleral icterus, no nasal drainage, pharynx without erythema or exudate.  TMs normal bilaterally.  No cervical lymphadenopathy. Lungs: no use of accessory muscles, CTAB, no wheezes, rales or rhonchi Cardiovascular: RRR, No r/g/m, no peripheral edema Abdomen: BS present, soft, nontender Neuro:  A&Ox3, CN II-XII intact, ambulating with walker Skin:  Warm, dry, intact, thin skin, no lesions  Recent Results (from the past 2160 hour(s))  Basic metabolic panel     Status: Abnormal   Collection Time: 01/24/18  2:13 PM  Result Value Ref Range   Sodium 136 135 - 145 mmol/L   Potassium 4.3 3.5 - 5.1 mmol/L   Chloride 100 (L) 101 - 111 mmol/L   CO2 25 22 - 32 mmol/L   Glucose, Bld 116 (H) 65 - 99 mg/dL   BUN 23 (H) 6 - 20 mg/dL   Creatinine, Ser 1.33 (H) 0.61 - 1.24 mg/dL   Calcium 9.8 8.9 - 10.3 mg/dL   GFR calc non Af Amer 46 (L) >60 mL/min   GFR calc Af Amer 54 (L) >60 mL/min     Comment: (NOTE) The eGFR has been calculated using the CKD EPI equation. This calculation has not been validated in all clinical situations. eGFR's persistently <60 mL/min signify possible Chronic Kidney Disease.    Anion gap 11 5 - 15    Comment: Performed at [facility], [address] [place], [place] [address]  CBC     Status: Abnormal   Collection Time: 01/24/18  2:13 PM  Result Value Ref Range   WBC 9.6  3.8 - 10.6 K/uL   RBC 3.97 (L) 4.40 - 5.90 MIL/uL   Hemoglobin 12.6 (L) 13.0 - 18.0 g/dL   HCT 36.9 (L) 40.0 - 52.0 %   MCV 93.0 80.0 - 100.0 fL   MCH 31.8 26.0 - 34.0 pg   MCHC 34.2 32.0 - 36.0 g/dL   RDW 13.6 11.5 - 14.5 %   Platelets 182 150 - 440 K/uL    Comment: Performed at [facility], [address] [place], [place] [address]  Urinalysis, Complete w Microscopic     Status: Abnormal   Collection Time: 01/24/18  8:27 PM  Result Value Ref Range   Color, Urine AMBER (A) YELLOW    Comment: BIOCHEMICALS MAY BE AFFECTED BY COLOR   APPearance HAZY (A) CLEAR   Specific Gravity, Urine 1.023 1.005 - 1.030   pH 5.0 5.0 - 8.0   Glucose, UA NEGATIVE NEGATIVE mg/dL   Hgb urine dipstick NEGATIVE NEGATIVE   Bilirubin Urine NEGATIVE NEGATIVE   Ketones, ur 5 (A) NEGATIVE mg/dL   Protein, ur 30 (A) NEGATIVE mg/dL   Nitrite NEGATIVE NEGATIVE   Leukocytes, UA NEGATIVE NEGATIVE   RBC / HPF 0-5 0 - 5 RBC/hpf   WBC, UA 0-5 0 - 5 WBC/hpf   Bacteria, UA NONE SEEN NONE SEEN   Squamous Epithelial / LPF NONE SEEN 0 - 5   Mucus PRESENT    Hyaline Casts, UA PRESENT    Granular Casts, UA PRESENT     Comment: Performed at [facility], [address] [place], [place] [address]  Troponin I     Status: Abnormal   Collection Time: 01/24/18  8:28 PM  Result Value Ref Range   Troponin I 0.04 (HH) <0.03 ng/mL    Comment: CRITICAL RESULT CALLED TO, READ BACK BY AND VERIFIED WITH NOEL WEBSTER AT 2122 ON 01/24/18 RWW Performed at [facility], [address] [place], [place] [address]   APTT upon arrival     Status: None   Collection Time: 01/31/18  9:01 AM  Result Value Ref Range   aPTT 26 24 - 36 seconds    Comment: Performed at [facility], [place] [address], [place], [place] [address]  CBC upon arrival     Status: Abnormal   Collection Time: 01/31/18  9:01 AM  Result Value Ref Range   WBC 6.8 4.0 - 10.5 K/uL   RBC 2.97 (L) 4.22 - 5.81 MIL/uL   Hemoglobin 9.1 (L) 13.0 - 17.0 g/dL   HCT 27.3 (L) 39.0 - 52.0 %   MCV 91.9 78.0 - 100.0 fL   MCH 30.6 26.0 - 34.0 pg   MCHC 33.3 30.0 - 36.0 g/dL   RDW 13.4 11.5 - 15.5 %   Platelets 214 150 - 400 K/uL    Comment: Performed at [facility], [place] [address] [place], [place] [address]  Protime-INR upon arrival     Status: None   Collection Time: 01/31/18  9:01 AM  Result Value Ref Range   Prothrombin Time 13.1 11.4 - 15.2 seconds   INR 1.00     Comment: Performed at [facility] [address], [place], [place] [address]  CBG monitoring, ED     Status: None   Collection Time: 02/02/18 11:35 AM  Result Value Ref Range   Glucose-Capillary 97 65 - 99 mg/dL  Basic metabolic panel     Status: Abnormal   Collection Time: 02/02/18 11:39 AM  Result Value Ref Range   Sodium 133 (L) 135 - 145 mmol/L   Potassium 3.4 (L) 3.5 - 5.1 mmol/L   Chloride 97 (L) 101 - 111 mmol/L   CO2 26 22 - 32 mmol/L   Glucose, Bld 108 (H) 65 - 99 mg/dL   BUN 22 (H) 6 - 20 mg/dL   Creatinine, Ser 1.19 0.61 - 1.24 mg/dL   Calcium 8.8 (L) 8.9 - 10.3 mg/dL   GFR calc non Af Amer 53 (L) >60 mL/min   GFR calc Af Amer >60 >60 mL/min    Comment: (NOTE) The eGFR has been calculated using the CKD EPI equation. This calculation has not been validated in all clinical situations. eGFR's persistently <60 mL/min signify possible Chronic Kidney Disease.    Anion gap 10 5 - 15    Comment: Performed at [facility] [address], [place], [place] [address]  CBC     Status: Abnormal    Collection Time: 02/02/18 11:39 AM  Result Value Ref Range   WBC 7.6 4.0 - 10.5 K/uL   RBC 3.25 (L) 4.22 - 5.81 MIL/uL   Hemoglobin 10.0 (L) 13.0 - 17.0 g/dL   HCT 30.4 (L) 39.0 - 52.0 %   MCV 93.5 78.0 - 100.0 fL   MCH 30.8 26.0 - 34.0 pg   MCHC 32.9 30.0 - 36.0 g/dL   RDW 13.3 11.5 - 15.5 %   Platelets 266 150 - 400 K/uL    Comment: Performed at [facility], [place] [address], [place], [place] [address]  I-Stat Troponin, ED (not at [facility])     Status: None   Collection Time: 02/02/18 12:39 PM  Result Value Ref Range   Troponin i, poc 0.01 0.00 - 0.08 ng/mL   Comment 3            Comment: Due to the release kinetics of cTnI, a negative result within the first hours of the onset of symptoms does not rule out myocardial infarction with certainty. If myocardial infarction is still suspected, repeat the test at appropriate intervals.   Urinalysis, Routine w reflex microscopic     Status: Abnormal   Collection Time: 02/02/18  2:06 PM  Result Value Ref Range   Color, Urine YELLOW YELLOW   APPearance HAZY (A) CLEAR   Specific Gravity, Urine 1.017 1.005 - 1.030   pH 5.0 5.0 - 8.0   Glucose, UA NEGATIVE NEGATIVE mg/dL   Hgb urine dipstick NEGATIVE NEGATIVE   Bilirubin Urine NEGATIVE NEGATIVE   Ketones, ur NEGATIVE NEGATIVE mg/dL   Protein, ur NEGATIVE NEGATIVE mg/dL   Nitrite NEGATIVE NEGATIVE   Leukocytes, UA LARGE (A) NEGATIVE   RBC / HPF 11-20 0 - 5 RBC/hpf   WBC, UA 11-20 0 - 5 WBC/hpf   Bacteria, UA RARE (A) NONE SEEN   Squamous Epithelial / LPF 0-5 0 - 5   Mucus PRESENT    Hyaline Casts, UA PRESENT     Comment: Performed at [facility], [address] [place], [place] [address]  Brain natriuretic peptide     Status: Abnormal   Collection Time: 02/02/18  2:06 PM  Result Value Ref Range   B Natriuretic Peptide 307.6 (H) 0.0 - 100.0 pg/mL    Comment: Performed at [facility] [address], [place], [place] [address]  POC occult blood, ED Provider will  collect     Status: Abnormal   Collection Time: 02/02/18  3:26 PM  Result Value Ref Range   Fecal Occult Bld POSITIVE (A) NEGATIVE  Magnesium     Status: None   Collection Time: 02/02/18  5:08 PM  Result Value Ref Range   Magnesium 1.9 1.7 - 2.4 mg/dL    Comment: Performed at [facility], [place] [address] [place], [place] [address]  TSH     Status: None   Collection Time: 02/02/18  6:51 PM  Result Value Ref Range   TSH 1.562 0.350 - 4.500 uIU/mL    Comment: Performed by a 3rd Generation assay with a functional sensitivity of <=0.01 uIU/mL. Performed at [facility], [place] [address], [place], [place] [address]   Basic metabolic panel     Status: Abnormal   Collection Time: 02/03/18  3:39 AM  Result Value Ref Range   Sodium 140 135 - 145 mmol/L   Potassium 3.8 3.5 - 5.1 mmol/L   Chloride 106 101 - 111 mmol/L   CO2 28 22 - 32 mmol/L   Glucose, Bld 100 (H) 65 - 99 mg/dL   BUN 19 6 - 20 mg/dL   Creatinine, Ser 1.10 0.61 - 1.24 mg/dL   Calcium 8.2 (L) 8.9 - 10.3 mg/dL   GFR calc non Af Amer 58 (L) >60 mL/min   GFR calc Af Amer >60 >60 mL/min    Comment: (NOTE) The eGFR has been calculated using the CKD EPI equation. This calculation has not been validated in all clinical situations. eGFR's persistently <60 mL/min signify possible Chronic Kidney Disease.    Anion gap 6 5 - 15    Comment: Performed at [facility] [address] [place], [place] [address]  CBC     Status: Abnormal   Collection Time: 02/03/18  3:39 AM  Result Value Ref Range   WBC 6.4 4.0 - 10.5 K/uL   RBC 2.72 (L) 4.22 - 5.81 MIL/uL   Hemoglobin 8.3 (L) 13.0 - 17.0 g/dL   HCT 25.7 (L) 39.0 - 52.0 %   MCV 94.5 78.0 - 100.0 fL   MCH 30.5 26.0 - 34.0 pg   MCHC 32.3 30.0 - 36.0 g/dL   RDW 13.5 11.5 - 15.5 %   Platelets 220 150 - 400 K/uL    Comment: Performed at [facility], [place] [address] [place], [place] [address]  Glucose, capillary     Status: None   Collection Time: 02/03/18   5:14 AM  Result Value Ref Range   Glucose-Capillary 86 65 - 99 mg/dL  Reticulocytes     Status: Abnormal   Collection Time: 02/03/18  9:21 AM  Result Value Ref Range   Retic Ct Pct 2.1 0.4 - 3.1 %   RBC. 2.73 (L) 4.22 - 5.81 MIL/uL   Retic Count, Absolute 57.3 19.0 - 186.0 K/uL    Comment: Performed at [facility] [address] [place], [place] [address]  ACTH stimulation, 3 time points     Status: None   Collection Time: 02/03/18  9:21 AM  Result Value Ref Range   Cortisol, Base 22.5 ug/dL    Comment: NO NORMAL RANGE ESTABLISHED FOR THIS TEST   Cortisol, 30 Min 27.5 ug/dL   Cortisol, 60 Min 30.6 ug/dL    Comment: Performed at [facility] [address], [place], [place] [address]  CBC with Differential/Platelet     Status: Abnormal   Collection Time: 02/03/18  9:21 AM  Result Value Ref Range   WBC 7.1  4.0 - 10.5 K/uL   RBC 2.71 (L) 4.22 - 5.81 MIL/uL   Hemoglobin 8.3 (L) 13.0 - 17.0 g/dL   HCT 25.5 (L) 39.0 - 52.0 %   MCV 94.1 78.0 - 100.0 fL   MCH 30.6 26.0 - 34.0 pg   MCHC 32.5 30.0 - 36.0 g/dL   RDW 13.5 11.5 - 15.5 %   Platelets 222 150 - 400 K/uL   Neutrophils Relative % 73 %   Neutro Abs 5.2 1.7 - 7.7 K/uL   Lymphocytes Relative 10 %   Lymphs Abs 0.7 0.7 - 4.0 K/uL   Monocytes Relative 14 %   Monocytes Absolute 1.0 0.1 - 1.0 K/uL   Eosinophils Relative 2 %   Eosinophils Absolute 0.2 0.0 - 0.7 K/uL   Basophils Relative 1 %   Basophils Absolute 0.1 0.0 - 0.1 K/uL   Immature Granulocytes 0 %   Abs Immature Granulocytes 0.0 0.0 - 0.1 K/uL    Comment: Performed at [facility] [address] [place], [place] [address]  Glucose, capillary     Status: None   Collection Time: 02/04/18  5:55 AM  Result Value Ref Range   Glucose-Capillary 92 65 - 99 mg/dL  Basic metabolic panel     Status: Abnormal   Collection Time: 02/04/18  6:01 AM  Result Value Ref Range   Sodium 139 135 - 145 mmol/L   Potassium 3.9 3.5 - 5.1 mmol/L   Chloride 106 101 -  111 mmol/L   CO2 26 22 - 32 mmol/L   Glucose, Bld 96 65 - 99 mg/dL   BUN 20 6 - 20 mg/dL   Creatinine, Ser 1.12 0.61 - 1.24 mg/dL   Calcium 8.5 (L) 8.9 - 10.3 mg/dL   GFR calc non Af Amer 57 (L) >60 mL/min   GFR calc Af Amer >60 >60 mL/min    Comment: (NOTE) The eGFR has been calculated using the CKD EPI equation. This calculation has not been validated in all clinical situations. eGFR's persistently <60 mL/min signify possible Chronic Kidney Disease.    Anion gap 7 5 - 15    Comment: Performed at [facility] [address], [place], [place] [address]  CBC     Status: Abnormal   Collection Time: 02/04/18  6:01 AM  Result Value Ref Range   WBC 7.4 4.0 - 10.5 K/uL   RBC 2.78 (L) 4.22 - 5.81 MIL/uL   Hemoglobin 8.5 (L) 13.0 - 17.0 g/dL   HCT 26.1 (L) 39.0 - 52.0 %   MCV 93.9 78.0 - 100.0 fL   MCH 30.6 26.0 - 34.0 pg   MCHC 32.6 30.0 - 36.0 g/dL   RDW 13.5 11.5 - 15.5 %   Platelets 236 150 - 400 K/uL    Comment: Performed at [facility], [place] [address], [place], [place] [address]  ECHOCARDIOGRAM COMPLETE     Status: None   Collection Time: 02/04/18  9:16 AM  Result Value Ref Range   Weight 2,179.2 oz   Height 75 in   BP 152/68 mmHg  Glucose, capillary     Status: Abnormal   Collection Time: 02/04/18  9:42 AM  Result Value Ref Range   Glucose-Capillary 100 (H) 65 - 99 mg/dL  Ferritin     Status: None   Collection Time: 02/04/18 11:02 AM  Result Value Ref Range   Ferritin 96 24 - 336 ng/mL    Comment: Performed at [facility],  [address], [place], [place] [address]  Iron and TIBC     Status: Abnormal   Collection Time: 02/04/18 11:02 AM  Result Value Ref Range   Iron 31 (L) 45 - 182 ug/dL   TIBC 218 (L) 250 - 450 ug/dL   Saturation Ratios 14 (L) 17.9 - 39.5 %   UIBC 187 ug/dL    Comment: Performed at [facility], [place] [address], [place], [place] [address]  Vitamin B12     Status: None   Collection Time: 02/04/18 11:02 AM  Result Value  Ref Range   Vitamin B-12 492 180 - 914 pg/mL    Comment: (NOTE) This assay is not validated for testing neonatal or myeloproliferative syndrome specimens for Vitamin B12 levels. Performed at [facility], [place] [address] [place], [place] [address]   Folate RBC     Status: Abnormal   Collection Time: 02/04/18 11:02 AM  Result Value Ref Range   Folate, Hemolysate 419.2 Not Estab. ng/mL   Hematocrit 25.6 (L) 37.5 - 51.0 %   Folate, RBC 1,638 >498 ng/mL    Comment: (NOTE) Performed At: [facility] [place], [place] [id number] Rush Farmer MD [id number] Performed at [facility], [place] [address] [place], [place] [address]   CBC     Status: Abnormal   Collection Time: 02/05/18  7:40 AM  Result Value Ref Range   WBC 11.5 (H) 4.0 - 10.5 K/uL   RBC 3.07 (L) 4.22 - 5.81 MIL/uL   Hemoglobin 9.3 (L) 13.0 - 17.0 g/dL   HCT 28.7 (L) 39.0 - 52.0 %   MCV 93.5 78.0 - 100.0 fL   MCH 30.3 26.0 - 34.0 pg   MCHC 32.4 30.0 - 36.0 g/dL   RDW 13.6 11.5 - 15.5 %   Platelets 261 150 - 400 K/uL    Comment: Performed at [facility], [place] [address], [place], [place] [address]  Basic metabolic panel     Status: Abnormal   Collection Time: 02/05/18  7:40 AM  Result Value Ref Range   Sodium 139 135 - 145 mmol/L   Potassium 4.2 3.5 - 5.1 mmol/L   Chloride 102 101 - 111 mmol/L   CO2 28 22 - 32 mmol/L   Glucose, Bld 99 65 - 99 mg/dL   BUN 20 6 - 20 mg/dL   Creatinine, Ser 1.06 0.61 - 1.24 mg/dL   Calcium 8.6 (L) 8.9 - 10.3 mg/dL   GFR calc non Af Amer >60 >60 mL/min   GFR calc Af Amer >60 >60 mL/min    Comment: (NOTE) The eGFR has been calculated using the CKD EPI equation. This calculation has not been validated in all clinical situations. eGFR's persistently <60 mL/min signify possible Chronic Kidney Disease.    Anion gap 9 5 - 15    Comment: Performed at [facility] [address], [place], [place] [address]  CBC     Status: Abnormal    Collection Time: 02/06/18  4:49 AM  Result Value Ref Range   WBC 9.5 4.0 - 10.5 K/uL   RBC 2.86 (L) 4.22 - 5.81 MIL/uL   Hemoglobin 8.7 (L) 13.0 - 17.0 g/dL   HCT 27.0 (L) 39.0 - 52.0 %   MCV 94.4 78.0 - 100.0 fL   MCH 30.4 26.0 - 34.0 pg   MCHC 32.2 30.0 - 36.0 g/dL   RDW 13.5 11.5 - 15.5 %   Platelets 230  150 - 400 K/uL    Comment: Performed at [facility], [place] [address], [place], [place] [address]  Basic metabolic panel     Status: Abnormal   Collection Time: 02/06/18  4:49 AM  Result Value Ref Range   Sodium 137 135 - 145 mmol/L   Potassium 4.1 3.5 - 5.1 mmol/L   Chloride 103 101 - 111 mmol/L   CO2 28 22 - 32 mmol/L   Glucose, Bld 101 (H) 65 - 99 mg/dL   BUN 23 (H) 6 - 20 mg/dL   Creatinine, Ser 1.17 0.61 - 1.24 mg/dL   Calcium 8.4 (L) 8.9 - 10.3 mg/dL   GFR calc non Af Amer 54 (L) >60 mL/min   GFR calc Af Amer >60 >60 mL/min    Comment: (NOTE) The eGFR has been calculated using the CKD EPI equation. This calculation has not been validated in all clinical situations. eGFR's persistently <60 mL/min signify possible Chronic Kidney Disease.    Anion gap 6 5 - 15    Comment: Performed at [facility] [address], [place], [place] [address]  Glucose, capillary     Status: None   Collection Time: 02/06/18  7:20 AM  Result Value Ref Range   Glucose-Capillary 92 65 - 99 mg/dL  CBC     Status: Abnormal   Collection Time: 02/19/18  4:04 PM  Result Value Ref Range   WBC 4.8 4.0 - 10.5 K/uL   RBC 3.39 (L) 4.22 - 5.81 Mil/uL   Platelets 294.0 150.0 - 400.0 K/uL   Hemoglobin 10.7 (L) 13.0 - 17.0 g/dL   HCT 31.1 (L) 39.0 - 52.0 %   MCV 91.8 78.0 - 100.0 fl   MCHC 34.4 30.0 - 36.0 g/dL   RDW 13.7 11.5 - 15.5 %    Assessment/Plan: Orthostatic hypotension -BP stable.  138/70 this visit. -Continue fludrocortisone 0.1 mg daily and Midodrin 10 mg 3 times daily -Discussed obtaining BP cuff for home BP monitoring. -If BP remains stable discussed decreasing dose  of Midodrin with plans to wean off -Patient encouraged to increase p.o. intake of water. -Discussed at this point, given patient's age he can eat whatever he feels like eating. -Continue follow-up with cardiology PRN.  Anemia, unspecified type -Hemoglobin 8.5 on 02/11/2018.  [facility] of [facility] made and reviewed.  Will scanned to chart -Continue ferrous sulfate 325 mg daily. -Discussed may cause dark stools or constipation.  Patient can use MiraLAX as needed for constipation. -Discussed obtaining labs to further work-up for anemia.  We will readdress at next [facility]  Encounter to establish care -We reviewed the PMH, PSH, FH, SH, Meds and Allergies. -We provided refills for any medications we will prescribe as needed. -We addressed current concerns per orders and patient instructions. -We have asked for records for pertinent exams, studies, vaccines and notes from previous providers. -We have advised patient to follow up per instructions below.  F/u in the next few wks  Grier Mitts, MD

## 2018-03-07 ENCOUNTER — Telehealth: Payer: Self-pay | Admitting: Family Medicine

## 2018-03-07 NOTE — Telephone Encounter (Unsigned)
Copied from [place] [phone]. Topic: General - Other >> Mar 07, 2018 12:18 PM Yvette Rack wrote: Reason for CRM: Kendra with [facility] request verbal orders for Home Health for skilled nursing for 1 time a week for 4 weeks. Tillie Rung also requests 4 PRN visits along with an evaluation for social work, physical therapy and occupational therapy. Cb# [phone]

## 2018-03-10 ENCOUNTER — Telehealth: Payer: Self-pay | Admitting: Family Medicine

## 2018-03-10 NOTE — Telephone Encounter (Signed)
ok 

## 2018-03-10 NOTE — Telephone Encounter (Signed)
Please Advise

## 2018-03-10 NOTE — Telephone Encounter (Signed)
Called Kendra with [facility], left a detailed message to return my call regarding pt verbal orders for home health and skilled nursing approval.

## 2018-03-10 NOTE — Telephone Encounter (Unsigned)
Copied from [place] [phone]. Topic: General - Other >> Mar 07, 2018 12:18 PM Yvette Rack wrote: Reason for CRM: Kendra with [facility] request verbal orders for Home Health for skilled nursing for 1 time a week for 4 weeks. Tillie Rung also requests 4 PRN visits along with an evaluation for social work, physical therapy and occupational therapy. Cb# [phone]

## 2018-03-10 NOTE — Telephone Encounter (Signed)
Please Advise if ok to give order.

## 2018-03-12 NOTE — Telephone Encounter (Signed)
Spoke with Dorian Pod with [facility] home [facility] states that she received approval for verbal orders for pt

## 2018-03-18 ENCOUNTER — Encounter: Payer: Self-pay | Admitting: Family Medicine

## 2018-03-19 ENCOUNTER — Other Ambulatory Visit: Payer: Self-pay

## 2018-03-19 MED ORDER — FLUDROCORTISONE ACETATE 0.1 MG PO TABS: 0.1000 mg | ORAL_TABLET | Freq: Every day | ORAL | 0 refills | Status: DC

## 2018-03-20 ENCOUNTER — Ambulatory Visit: Payer: Self-pay | Admitting: *Deleted

## 2018-03-20 ENCOUNTER — Encounter: Payer: Self-pay | Admitting: Family Medicine

## 2018-03-20 NOTE — Telephone Encounter (Signed)
Please have patient sign check patient's blood pressure twice daily for the next few days.  Patient has had issues with hypotension in the past and his BP meds were stopped.  Patient was started on medications to increase his blood pressure and is followed by cardiology.  If BP continues to be greater than 150/90 patient should notify clinic.

## 2018-03-20 NOTE — Telephone Encounter (Signed)
Pt's b/p was 180/80 this morning per OT, who was there to evaluated him for therapy.  She stated the patient has denied having symptoms. No h/a, blurred vision, dizziness.  Nurse will be visiting him later today.   Called pt but got his son. He stated that he had been watching his dad on the monitor and felt like he was doing fine. He did stated that the OT had him stand and this b/p did go down but he was not told what it was and he said that his dad looked fine.  Not looking faint.

## 2018-03-20 NOTE — Telephone Encounter (Signed)
Called patient's son (on DPR) and left message to return call. 

## 2018-03-20 NOTE — Telephone Encounter (Signed)
FYI.  BP Readings from Last 3 Encounters:  03/06/18 138/70  03/04/18 (!) 147/66  02/19/18 136/60

## 2018-03-21 ENCOUNTER — Other Ambulatory Visit: Payer: Self-pay | Admitting: Family Medicine

## 2018-03-21 DIAGNOSIS — H9193 Unspecified hearing loss, bilateral: Secondary | ICD-10-CM

## 2018-03-21 NOTE — Telephone Encounter (Signed)
Pt's son notified of results/instructions and verbalized understanding. He will check BP sitting and standing over the weekend since patient has been having issues w/ orthostasis.

## 2018-03-25 ENCOUNTER — Encounter: Payer: Self-pay | Admitting: Family Medicine

## 2018-03-30 ENCOUNTER — Encounter: Payer: Self-pay | Admitting: Family Medicine

## 2018-04-02 ENCOUNTER — Encounter: Payer: Self-pay | Admitting: Cardiology

## 2018-04-02 ENCOUNTER — Other Ambulatory Visit (INDEPENDENT_AMBULATORY_CARE_PROVIDER_SITE_OTHER): Payer: Self-pay | Admitting: Internal Medicine

## 2018-04-02 ENCOUNTER — Encounter: Payer: Self-pay | Admitting: Family Medicine

## 2018-04-03 ENCOUNTER — Other Ambulatory Visit: Payer: Self-pay | Admitting: Family Medicine

## 2018-04-03 ENCOUNTER — Telehealth: Payer: Self-pay | Admitting: *Deleted

## 2018-04-03 MED ORDER — MIDODRINE HCL 10 MG PO TABS: 10.0000 mg | ORAL_TABLET | Freq: Two times a day (BID) | ORAL | 3 refills | Status: DC

## 2018-04-03 MED ORDER — MIDODRINE HCL 10 MG PO TABS: 10.0000 mg | ORAL_TABLET | Freq: Three times a day (TID) | ORAL | 2 refills | Status: DC

## 2018-04-03 NOTE — Telephone Encounter (Signed)
Received call from [facility] received a new rx for midodrine from Dr. Percival Spanish but this only was a 10 day supply with refills.  They wanted to verify if this was correct.    Advised this was incorrect but per patient message he is taking BID.   Advised would send update rx with correct instructions and quantity.   rx sent

## 2018-04-07 ENCOUNTER — Other Ambulatory Visit: Payer: Self-pay | Admitting: Family Medicine

## 2018-04-08 MED ORDER — FLUDROCORTISONE ACETATE 0.1 MG PO TABS: 0.1000 mg | ORAL_TABLET | Freq: Every day | ORAL | 0 refills | Status: DC

## 2018-04-17 ENCOUNTER — Encounter: Payer: Self-pay | Admitting: Family Medicine

## 2018-04-18 NOTE — Telephone Encounter (Signed)
Spoke with son Daren Corporate investment banker) who sent MyChart messages and helps care for his father. Offered 0800 appointment on 8/12 or appointment on 8/14. Son would like to bring patient in for appointment on 8/14 @ 1040. Son thinks patient is dehydrated, he not been drinking water as much as he should but instead soda. He repeatedly asked if there was a place his dad could go to have IV fluids for rehydration without having to go to ED. Explained that I am unaware of any location besides the hospital and it would be up to the provider's discretion if the patient needs IV fluids.

## 2018-04-21 NOTE — Progress Notes (Addendum)
Cardiology Office Note   Date:  04/23/2018   ID:  Jose Baldwin, DOB May 09, 1929, MRN [id number]  PCP:  Billie Ruddy, MD  Cardiologist:   Minus Breeding, MD    Chief Complaint  Patient presents with  . Dizziness      History of Present Illness: Jose Baldwin is a 84 y.o. male who presents for follow up of syncope. He was in the hospital recently with this.   He was found to be orthostatic.  He was treated with Midodrine and TED hose.  Echo at the time of that admission was essentially unremarkable.   He was to have an event monitor but he did not have this placed.  Metoprolol and Noravsc were stopped.     He went to a rehab center and he was placed on Florinef.  His son called recently and reported that his father was not drinking enough water but was drinking soda and he wanted his father to receive an IV for fluid.  He was placed on this schedule.    Since then he has been increasing his hydration.  His blood pressure today is elevated.  However, it continues to be very labile.  He was presyncopal the other day.  His blood pressure typically dips into the 333 systolic.  We had trouble taking his blood pressure today and had to use a manual small blood pressure cuff.  His home readings however are much lower.  He is not having any new shortness of breath, PND or orthopnea.  He is not having any chest pain, neck or arm discomfort.  He is eating well.   Past Medical History:  Diagnosis Date  . Hypertension   . Prostate cancer ([place])   . Syncope     Past Surgical History:  Procedure Laterality Date  . BACK SURGERY    . IR VERTEBROPLASTY CERV/THOR BX INC UNI/BIL INC/INJECT/IMAGING  01/31/2018     Current Outpatient Medications  Medication Sig Dispense Refill  . feeding supplement, ENSURE ENLIVE, (ENSURE ENLIVE) LIQD Take 237 mLs by mouth 3 (three) times daily between meals. 237 mL 12  . ferrous sulfate 325 (65 FE) MG tablet Take 1 tablet (325 mg total) by mouth daily with  breakfast.  3  . fludrocortisone (FLORINEF) 0.1 MG tablet Take 2 daily as directed by physician 60 tablet 3  . midodrine (PROAMATINE) 5 MG tablet Take 1 tablet (5 mg total) by mouth 2 (two) times daily. 60 tablet 6  . Multiple Vitamin (MULTI-VITAMINS) TABS Take 1 tablet by mouth daily.     . ranibizumab (LUCENTIS) 0.5 MG/0.05ML SOLN 0.5 mg by Intravitreal route once. Every 8 weeks     No current facility-administered medications for this visit.     Allergies:   Patient has no known allergies.    ROS:  Please see the history of present illness.   Otherwise, review of systems are positive for none.   All other systems are reviewed and negative.    PHYSICAL EXAM: VS:  BP (!) 181/85   Pulse 68   Ht 6\' 4"  (1.93 m)   Wt 125 lb (56.7 kg)   BMI 15.22 kg/m  , BMI Body mass index is 15.22 kg/m. GENERAL:  Frail appearing NECK:  No jugular venous distention, waveform within normal limits, carotid upstroke brisk and symmetric, no bruits, no thyromegaly LUNGS:  Clear to auscultation bilaterally CHEST:  Unremarkable HEART:  PMI not displaced or sustained,S1 and S2 within normal limits, no  S3, no S4, no clicks, no rubs, no murmurs ABD:  Flat, positive bowel sounds normal in frequency in pitch, no bruits, no rebound, no guarding, no midline pulsatile mass, no hepatomegaly, no splenomegaly EXT:  2 plus pulses throughout, no edema, no cyanosis no clubbing NEURO:  Severe muscle wasting.    EKG:  EKG is not  ordered today.   Recent Labs: 02/02/2018: B Natriuretic Peptide 307.6; Magnesium 1.9; TSH 1.562 02/06/2018: BUN 23; Creatinine, Ser 1.17; Potassium 4.1; Sodium 137 02/19/2018: Hemoglobin 10.7; Platelets 294.0    Lipid Panel No results found for: CHOL, TRIG, HDL, CHOLHDL, VLDL, LDLCALC, LDLDIRECT    Wt Readings from Last 3 Encounters:  04/23/18 125 lb (56.7 kg)  03/06/18 132 lb (59.9 kg)  03/04/18 134 lb 9.6 oz (61.1 kg)      Other studies Reviewed: Additional studies/ records that  were reviewed today include: None Review of the above records demonstrates:  None    ASSESSMENT AND PLAN:  SYNCOPE:   He is had no further events.  We are addressing this in the context of treating his labile blood pressures.     HTN: He has labile blood pressures with orthostatic hypotension.  Today he is hypertensive.  This is certainly a moving target.  He seems to do better with Florinef and with midodrine.  I am going to reduce the midodrine to 5 mg twice daily and eventually we might eliminate this.  He is going to continue the current dose of Florinef but if he is having particularly low blood pressures he will take two any given day and might be able to reduce his fluid intake and salt loading.  He cannot put on the compression stockings because of weakness.  We talked at length about looking for an exit strategy to sit down if he gets lightheaded.  He does use a walker and I suggested one with a seat.  He would not be able to wear an abdominal binder as it would be hard to get on and somewhat uncomfortable.  We talked about maneuvers to improve his blood pressure in the morning particularly before sitting up and standing up.  Precaution is the most valuable tool here.   PROTEIN CALORIE MALNUTRITION:  We talked about diet.  He has meals on wheels.     Current medicines are reviewed at length with the patient today.  The patient does not have concerns regarding medicines.  The following changes have been made:  As above  Labs/ tests ordered today include: None No orders of the defined types were placed in this encounter.    Disposition:   FU with me in 1 month.     Signed, Minus Breeding, MD  04/23/2018 12:02 PM    [facility]

## 2018-04-23 ENCOUNTER — Telehealth: Payer: Self-pay | Admitting: Cardiology

## 2018-04-23 ENCOUNTER — Ambulatory Visit (INDEPENDENT_AMBULATORY_CARE_PROVIDER_SITE_OTHER): Payer: Medicare Other | Admitting: Cardiology

## 2018-04-23 ENCOUNTER — Encounter: Payer: Self-pay | Admitting: Cardiology

## 2018-04-23 VITALS — BP 181/85 | HR 68 | Ht 76.0 in | Wt 125.0 lb

## 2018-04-23 DIAGNOSIS — R55 Syncope and collapse: Secondary | ICD-10-CM | POA: Diagnosis not present

## 2018-04-23 DIAGNOSIS — I951 Orthostatic hypotension: Secondary | ICD-10-CM

## 2018-04-23 DIAGNOSIS — R42 Dizziness and giddiness: Secondary | ICD-10-CM

## 2018-04-23 MED ORDER — MIDODRINE HCL 5 MG PO TABS: 5.0000 mg | ORAL_TABLET | Freq: Two times a day (BID) | ORAL | 6 refills | Status: DC

## 2018-04-23 MED ORDER — FLUDROCORTISONE ACETATE 0.1 MG PO TABS: ORAL_TABLET | ORAL | 3 refills | Status: DC

## 2018-04-23 NOTE — Telephone Encounter (Signed)
New problem    [facility] need clarification on both prescriptions that was sent over to them concerning dosage and how pt should take them

## 2018-04-23 NOTE — Telephone Encounter (Signed)
Message was previously addressed.  Pt was advised to go to the ED.

## 2018-04-23 NOTE — Telephone Encounter (Signed)
Called pharmacy. Verified that medications were sent in correctly. The Florinef was verified with Dr.Hochrein, and patient understands how to take correctly.  Pharmacy had no other questions or concerns.

## 2018-04-23 NOTE — Patient Instructions (Signed)
Medication Instructions:  DECREASE- Midodrine 5 mg twice a day  If you need a refill on your cardiac medications before your next appointment, please call your pharmacy.  Labwork: None Ordered  Testing/Procedures: None Ordered  Follow-Up: Your physician wants you to follow-up in: 1 Month.     Thank you for choosing [facility] at [facility]!!

## 2018-05-06 ENCOUNTER — Encounter: Payer: Self-pay | Admitting: Cardiology

## 2018-05-14 ENCOUNTER — Telehealth: Payer: Self-pay | Admitting: Family Medicine

## 2018-05-14 NOTE — Telephone Encounter (Signed)
Copied from [place] [id number]. Topic: Inquiry >> May 14, 2018  9:41 AM Oliver Pila B wrote: Reason for CRM: [facility] home health called about orders for nursing frequency for treatment and OT for dressing/grooming; the document # is [phone]; they state this was faxed and they have been waiting for signature since June; contact [phone]

## 2018-05-16 NOTE — Telephone Encounter (Signed)
Called left a detailed message on June voicemail with approval orders for pt home health orders.

## 2018-05-19 ENCOUNTER — Other Ambulatory Visit: Payer: Self-pay

## 2018-05-19 MED ORDER — FLUDROCORTISONE ACETATE 0.1 MG PO TABS: ORAL_TABLET | ORAL | 3 refills | Status: DC

## 2018-05-19 NOTE — Telephone Encounter (Signed)
Rx(s) sent to pharmacy electronically.  

## 2018-05-26 NOTE — Progress Notes (Signed)
Cardiology Office Note   Date:  05/27/2018   ID:  Jose Baldwin, DOB 03/09/1929, MRN [id number]  PCP:  Billie Ruddy, MD  Cardiologist:   Minus Breeding, MD    Chief Complaint  Patient presents with  . Orthostatic Hypotension      History of Present Illness: Jose Baldwin is a 84 y.o. male who presents for follow up of syncope related to orthostasis.    He was treated with Midodrine and TED hose.  Echo at the time of that admission was essentially unremarkable.   He was to have an event monitor but he did not have this placed.  Metoprolol and Noravsc were stopped.     He went to a rehab center and he was placed on Florinef.   He has called multiple times with labile blood pressures.    Since I last saw him his son has been carefully watching him in changes his Florinef and Midrin dosing based on the blood pressure readings that he does quite well with this.  The patient's been eating well.  He walks with a walker and avoids falling.  Is not had any severe orthostatic symptoms.  He certainly had no syncope or presyncope.  He actually says he feels relatively well.  He is not wearing his TED hose as he should    Past Medical History:  Diagnosis Date  . Hypertension   . Prostate cancer ([place])   . Syncope     Past Surgical History:  Procedure Laterality Date  . BACK SURGERY    . IR VERTEBROPLASTY CERV/THOR BX INC UNI/BIL INC/INJECT/IMAGING  01/31/2018     Current Outpatient Medications  Medication Sig Dispense Refill  . feeding supplement, ENSURE ENLIVE, (ENSURE ENLIVE) LIQD Take 237 mLs by mouth 3 (three) times daily between meals. 237 mL 12  . ferrous sulfate 325 (65 FE) MG tablet Take 325 mg by mouth every other day.    . fludrocortisone (FLORINEF) 0.1 MG tablet Take 0.2 mg by mouth at bedtime.     . midodrine (PROAMATINE) 10 MG tablet Take 1 tablet (10 mg total) by mouth 3 (three) times daily with meals. 90 tablet 11  . Multiple Vitamin (MULTI-VITAMINS) TABS Take 1  tablet by mouth daily.     . ranibizumab (LUCENTIS) 0.5 MG/0.05ML SOLN 0.5 mg by Intravitreal route once. Every 8 weeks     No current facility-administered medications for this visit.     Allergies:   Patient has no known allergies.    ROS:  Please see the history of present illness.   Otherwise, review of systems are positive for none.   All other systems are reviewed and negative.    PHYSICAL EXAM: VS:  BP (!) 183/81   Pulse 68   Ht 6\' 4"  (1.93 m)   Wt 124 lb 3.2 oz (56.3 kg)   BMI 15.12 kg/m  , BMI Body mass index is 15.12 kg/m. GENERAL:   Very thin and frail appearing but in no distress NECK:  No jugular venous distention, waveform within normal limits, carotid upstroke brisk and symmetric, no bruits, no thyromegaly LUNGS:  Clear to auscultation bilaterally CHEST:  Unremarkable HEART:  PMI not displaced or sustained,S1 and S2 within normal limits, no S3, no S4, no clicks, no rubs, no murmurs ABD:  Flat, positive bowel sounds normal in frequency in pitch, no bruits, no rebound, no guarding, no midline pulsatile mass, no hepatomegaly, no splenomegaly EXT:  2 plus pulses throughout, no  edema, no cyanosis no clubbing   EKG:  EKG not  ordered today.   Recent Labs: 02/02/2018: B Natriuretic Peptide 307.6; Magnesium 1.9; TSH 1.562 02/06/2018: BUN 23; Creatinine, Ser 1.17; Potassium 4.1; Sodium 137 02/19/2018: Hemoglobin 10.7; Platelets 294.0    Lipid Panel No results found for: CHOL, TRIG, HDL, CHOLHDL, VLDL, LDLCALC, LDLDIRECT    Wt Readings from Last 3 Encounters:  05/27/18 124 lb 3.2 oz (56.3 kg)  04/23/18 125 lb (56.7 kg)  03/06/18 132 lb (59.9 kg)      Other studies Reviewed: Additional studies/ records that were reviewed today include: None Review of the above records demonstrates:     ASSESSMENT AND PLAN:  SYNCOPE:   The patient is had no further episodes.  He will continue on the meds as listed and his son will confer with [place] regarding blood pressure  readings in the future.  We are addressing this by treating his orthostatic hypotension.  e is had no further events.  We are addressing this in the context of treating his labile blood pressures.     HTN: He has labile blood pressures with orthostatic hypotension.   No change.   PROTEIN CALORIE MALNUTRITION:   He is eating much better.  No change in therapy.   Current medicines are reviewed at length with the patient today.  The patient does not have concerns regarding medicines.  The following changes have been made:   None  Labs/ tests ordered today include: None No orders of the defined types were placed in this encounter.    Disposition:   FU with me in 6 month.     Signed, Minus Breeding, MD  05/27/2018 12:04 PM    [place]

## 2018-05-27 ENCOUNTER — Ambulatory Visit (INDEPENDENT_AMBULATORY_CARE_PROVIDER_SITE_OTHER): Payer: Medicare Other | Admitting: Cardiology

## 2018-05-27 ENCOUNTER — Encounter: Payer: Self-pay | Admitting: Cardiology

## 2018-05-27 VITALS — BP 183/81 | HR 68 | Ht 76.0 in | Wt 124.2 lb

## 2018-05-27 DIAGNOSIS — I951 Orthostatic hypotension: Secondary | ICD-10-CM | POA: Diagnosis not present

## 2018-05-27 MED ORDER — MIDODRINE HCL 10 MG PO TABS: 10.0000 mg | ORAL_TABLET | Freq: Three times a day (TID) | ORAL | 11 refills | Status: DC

## 2018-05-27 NOTE — Patient Instructions (Signed)
Medication Instructions:  INCREASE- Midodrine 10 mg three times a day  If you need a refill on your cardiac medications before your next appointment, please call your pharmacy.  Labwork: None Ordered   Testing/Procedures: None Ordered   Follow-Up: Your physician wants you to follow-up in: 6 Months. You should receive a reminder letter in the mail two months in advance. If you do not receive a letter, please call our office in [phone] to schedule your  follow-up appointment.     Thank you for choosing [facility] at [facility]!!

## 2018-08-11 NOTE — Progress Notes (Signed)
Cardiology Office Note   Date:  08/13/2018   ID:  Jose Baldwin, DOB August 13, 1929, MRN [id number]  PCP:  Billie Ruddy, MD  Cardiologist:   Minus Breeding, MD    Chief Complaint  Patient presents with  . Fatigue      History of Present Illness: Jose Baldwin is a 84 y.o. male who presents for follow up of syncope related to orthostasis.    He was admitted earlier in the year for this.  He was treated with Midodrine and TED hose.  Echo at the time of that admission was essentially unremarkable.   He was to have an event monitor but he did not have this placed.  Metoprolol and Noravsc were stopped.     He went to a rehab center and he was placed on Florinef.    Since I last saw him his blood pressures been running a little bit high.  He has been having some slightly progressive weakness.  He has not wanted to exercise.  He was pedaling a machine 3 times a week and is down about once a week.  He has not had any syncope.  He has not had any falls.  He thinks he is eating okay.  He denies any chest pressure, neck or arm discomfort.  He has had no new shortness of breath, PND or orthopnea.  Past Medical History:  Diagnosis Date  . Hypertension   . Prostate cancer ([place])   . Syncope     Past Surgical History:  Procedure Laterality Date  . BACK SURGERY    . IR VERTEBROPLASTY CERV/THOR BX INC UNI/BIL INC/INJECT/IMAGING  01/31/2018     Current Outpatient Medications  Medication Sig Dispense Refill  . feeding supplement, ENSURE ENLIVE, (ENSURE ENLIVE) LIQD Take 237 mLs by mouth 3 (three) times daily between meals. 237 mL 12  . ferrous sulfate 325 (65 FE) MG tablet Take 325 mg by mouth every other day.    . fludrocortisone (FLORINEF) 0.1 MG tablet Take 0.2 mg by mouth at bedtime.     . midodrine (PROAMATINE) 10 MG tablet 10 mg. Take one by mouth in the morning and take 2 tablets in the evening    . Multiple Vitamin (MULTI-VITAMINS) TABS Take 1 tablet by mouth daily.     .  ranibizumab (LUCENTIS) 0.5 MG/0.05ML SOLN 0.5 mg by Intravitreal route once. Every 8 weeks     No current facility-administered medications for this visit.     Allergies:   Patient has no known allergies.    ROS:  Please see the history of present illness.   Otherwise, review of systems are positive for none.   All other systems are reviewed and negative.    PHYSICAL EXAM: VS:  BP (!) 180/80   Pulse 72   Ht 6\' 3"  (1.905 m)   Wt 132 lb (59.9 kg)   BMI 16.50 kg/m  , BMI Body mass index is 16.5 kg/m. GENERAL: Very frail appearing NECK:  No jugular venous distention, waveform within normal limits, carotid upstroke brisk and symmetric, no bruits, no thyromegaly LUNGS:  Clear to auscultation bilaterally CHEST:  Unremarkable HEART:  PMI not displaced or sustained,S1 and S2 within normal limits, no S3, no S4, no clicks, no rubs, no murmurs ABD:  Flat, positive bowel sounds normal in frequency in pitch, no bruits, no rebound, no guarding, no midline pulsatile mass, no hepatomegaly, no splenomegaly EXT:  2 plus pulses throughout, no edema, no cyanosis no clubbing  EKG:  EKG not ordered today.   Recent Labs: 02/02/2018: B Natriuretic Peptide 307.6; Magnesium 1.9; TSH 1.562 02/06/2018: BUN 23; Creatinine, Ser 1.17; Potassium 4.1; Sodium 137 02/19/2018: Hemoglobin 10.7; Platelets 294.0    Lipid Panel No results found for: CHOL, TRIG, HDL, CHOLHDL, VLDL, LDLCALC, LDLDIRECT    Wt Readings from Last 3 Encounters:  08/13/18 132 lb (59.9 kg)  05/27/18 124 lb 3.2 oz (56.3 kg)  04/23/18 125 lb (56.7 kg)      Other studies Reviewed: Additional studies/ records that were reviewed today include: None Review of the above records demonstrates:     ASSESSMENT AND PLAN:  SYNCOPE:   He has had no further syncope.  However, he has had weakness.  No change in therapy is indicated.  I am allowing permissive hypertension because of his weakness and low blood pressures.   HTN:    Blood pressure  is slightly high.  However, as above I am more concerned about low blood pressures and orthostatic symptoms so I will not change his medications.  PROTEIN CALORIE MALNUTRITION:   We talked again about diet and his weight is actually up a little bit.  No change in therapy.  WEAKNESS: I will check a CBC.  Current medicines are reviewed at length with the patient today.  The patient does not have concerns regarding medicines.  The following changes have been made:   None  Labs/ tests ordered today include: None  Orders Placed This Encounter  Procedures  . CBC     Disposition:   FU with me in 12 months.     Signed, Minus Breeding, MD  08/13/2018 11:43 AM    [facility]

## 2018-08-13 ENCOUNTER — Ambulatory Visit (INDEPENDENT_AMBULATORY_CARE_PROVIDER_SITE_OTHER): Payer: Medicare Other | Admitting: Cardiology

## 2018-08-13 ENCOUNTER — Encounter: Payer: Self-pay | Admitting: Cardiology

## 2018-08-13 VITALS — BP 180/80 | HR 72 | Ht 75.0 in | Wt 132.0 lb

## 2018-08-13 DIAGNOSIS — R531 Weakness: Secondary | ICD-10-CM | POA: Diagnosis not present

## 2018-08-13 DIAGNOSIS — I10 Essential (primary) hypertension: Secondary | ICD-10-CM | POA: Diagnosis not present

## 2018-08-13 DIAGNOSIS — Z79899 Other long term (current) drug therapy: Secondary | ICD-10-CM | POA: Diagnosis not present

## 2018-08-13 LAB — CBC
HEMATOCRIT: 31.7 % — AB (ref 37.5–51.0)
Hemoglobin: 10.5 g/dL — ABNORMAL LOW (ref 13.0–17.7)
MCH: 30.5 pg (ref 26.6–33.0)
MCHC: 33.1 g/dL (ref 31.5–35.7)
MCV: 92 fL (ref 79–97)
Platelets: 145 10*3/uL — ABNORMAL LOW (ref 150–450)
RBC: 3.44 x10E6/uL — AB (ref 4.14–5.80)
RDW: 13 % (ref 12.3–15.4)
WBC: 5.7 10*3/uL (ref 3.4–10.8)

## 2018-08-13 NOTE — Patient Instructions (Signed)
Medication Instructions:  Continue current medications  If you need a refill on your cardiac medications before your next appointment, please call your pharmacy.  Labwork: CBC Today HERE IN OUR OFFICE AT [facility]  Take the provided lab slips with you to the lab for your blood draw.   You will NOT need to fast   If you have labs (blood work) drawn today and your tests are completely normal, you will receive your results only by: Marland Kitchen MyChart Message (if you have MyChart) OR . A paper copy in the mail If you have any lab test that is abnormal or we need to change your treatment, we will call you to review the results.  Testing/Procedures: None Ordered   Follow-Up: You will need a follow up appointment in 6 Months.  Please call our office 2 months in advance([phone]) to schedule the (6 Months) appointment.  You may see  DR Percival Spanish,  or one of the following Advanced Practice Providers on your designated Care Team:    . Jory Sims, DNP, ANP . Rhonda Barrett, PA-C  . Kerin Ransom, [place]  . Almyra Deforest, PA-C . Fabian Sharp, PA-C  At [facility], you and your health needs are our priority.  As part of our continuing mission to provide you with exceptional heart care, we have created designated Provider Care Teams.  These Care Teams include your primary Cardiologist (physician) and Advanced Practice Providers (APPs -  Physician Assistants and Nurse Practitioners) who all work together to provide you with the care you need, when you need it.   Thank you for choosing [facility] at [facility]!!

## 2018-08-20 ENCOUNTER — Encounter: Payer: Self-pay | Admitting: Family Medicine

## 2018-11-03 ENCOUNTER — Encounter: Payer: Self-pay | Admitting: Family Medicine

## 2018-12-29 ENCOUNTER — Other Ambulatory Visit: Payer: Self-pay

## 2018-12-29 MED ORDER — FLUDROCORTISONE ACETATE 0.1 MG PO TABS: 0.2000 mg | ORAL_TABLET | Freq: Every day | ORAL | 1 refills | Status: DC

## 2019-02-04 ENCOUNTER — Telehealth: Payer: Self-pay | Admitting: Cardiology

## 2019-02-04 NOTE — Telephone Encounter (Signed)
My Chart message sent

## 2019-02-16 ENCOUNTER — Telehealth: Payer: Self-pay | Admitting: Orthopaedic Surgery

## 2019-02-16 NOTE — Telephone Encounter (Signed)
Son calling to let you know [facility] was to send over an authorization for patient to get an MRI last week. Son is ready to get dad an MRI. Please call [facility] if we did not receive authorization, per son. Please call to discuss with son.

## 2019-02-17 NOTE — Telephone Encounter (Signed)
Please see below Dr.Whitfield.Marland KitchenMarland Kitchen

## 2019-02-17 NOTE — Telephone Encounter (Signed)
Not sure how to answer-either have him come to office or find out why MRI not ordered or approved-thanks

## 2019-02-17 NOTE — Telephone Encounter (Signed)
Please see below.

## 2019-02-17 NOTE — Telephone Encounter (Signed)
Spoke with patient's son, [name]. His father had an MRI done a few weeks ago. He is wanting to make sure we received the request for records before he schedules his father to come back in for a visit. We have not, so his son is going to contact the [place] again.

## 2019-02-17 NOTE — Telephone Encounter (Signed)
Pt has not been seen here with Dr. Durward Fortes since last year, no order was placed. Pt would have to be seen again before can place an MRI. I never received anything from the [place] either. And I do not do authorization from the [place].

## 2019-02-19 ENCOUNTER — Telehealth: Payer: Medicare Other | Admitting: Cardiology

## 2019-02-19 ENCOUNTER — Other Ambulatory Visit: Payer: Self-pay | Admitting: Orthopaedic Surgery

## 2019-02-19 DIAGNOSIS — M545 Low back pain, unspecified: Secondary | ICD-10-CM

## 2019-02-25 ENCOUNTER — Other Ambulatory Visit (HOSPITAL_COMMUNITY): Payer: Self-pay | Admitting: Interventional Radiology

## 2019-02-25 ENCOUNTER — Ambulatory Visit
Admission: RE | Admit: 2019-02-25 | Discharge: 2019-02-25 | Disposition: A | Discharge status: Home / Self Care | Payer: Self-pay | Source: Ambulatory Visit | Attending: Interventional Radiology | Admitting: Interventional Radiology

## 2019-02-25 DIAGNOSIS — R52 Pain, unspecified: Secondary | ICD-10-CM

## 2019-02-26 ENCOUNTER — Ambulatory Visit
Admission: RE | Admit: 2019-02-26 | Discharge: 2019-02-26 | Disposition: A | Discharge status: Home / Self Care | Payer: Self-pay | Source: Ambulatory Visit | Attending: Interventional Radiology | Admitting: Interventional Radiology

## 2019-02-26 ENCOUNTER — Other Ambulatory Visit (HOSPITAL_COMMUNITY): Payer: Self-pay | Admitting: Interventional Radiology

## 2019-02-26 DIAGNOSIS — R52 Pain, unspecified: Secondary | ICD-10-CM

## 2019-03-04 ENCOUNTER — Other Ambulatory Visit: Payer: Self-pay | Admitting: Student

## 2019-03-04 ENCOUNTER — Other Ambulatory Visit (HOSPITAL_COMMUNITY): Payer: Self-pay | Admitting: Interventional Radiology

## 2019-03-04 DIAGNOSIS — S22080A Wedge compression fracture of T11-T12 vertebra, initial encounter for closed fracture: Secondary | ICD-10-CM

## 2019-03-06 ENCOUNTER — Encounter (HOSPITAL_COMMUNITY): Payer: Self-pay

## 2019-03-06 ENCOUNTER — Other Ambulatory Visit: Payer: Self-pay

## 2019-03-06 ENCOUNTER — Ambulatory Visit (HOSPITAL_COMMUNITY)
Admission: RE | Admit: 2019-03-06 | Discharge: 2019-03-06 | Disposition: A | Discharge status: Home / Self Care | Payer: Medicare Other | Source: Ambulatory Visit | Attending: Interventional Radiology | Admitting: Interventional Radiology

## 2019-03-06 DIAGNOSIS — X58XXXA Exposure to other specified factors, initial encounter: Secondary | ICD-10-CM | POA: Insufficient documentation

## 2019-03-06 DIAGNOSIS — S22080A Wedge compression fracture of T11-T12 vertebra, initial encounter for closed fracture: Secondary | ICD-10-CM | POA: Insufficient documentation

## 2019-03-06 DIAGNOSIS — Z539 Procedure and treatment not carried out, unspecified reason: Secondary | ICD-10-CM | POA: Insufficient documentation

## 2019-03-06 LAB — URINALYSIS, ROUTINE W REFLEX MICROSCOPIC
Bilirubin Urine: NEGATIVE
Glucose, UA: NEGATIVE mg/dL
Hgb urine dipstick: NEGATIVE
Ketones, ur: NEGATIVE mg/dL
Nitrite: NEGATIVE
Protein, ur: 30 mg/dL — AB
Specific Gravity, Urine: 1.013 (ref 1.005–1.030)
WBC, UA: >50 WBC/hpf — ABNORMAL HIGH (ref 0–5)
pH: 6 (ref 5.0–8.0)

## 2019-03-06 LAB — CBC
HCT: 38.6 % — ABNORMAL LOW (ref 39.0–52.0)
Hemoglobin: 12.6 g/dL — ABNORMAL LOW (ref 13.0–17.0)
MCH: 29.6 pg (ref 26.0–34.0)
MCHC: 32.6 g/dL (ref 30.0–36.0)
MCV: 90.6 fL (ref 80.0–100.0)
Platelets: 133 10*3/uL — ABNORMAL LOW (ref 150–400)
RBC: 4.26 MIL/uL (ref 4.22–5.81)
RDW: 15.8 % — ABNORMAL HIGH (ref 11.5–15.5)
WBC: 6.4 10*3/uL (ref 4.0–10.5)
nRBC: 0 % (ref 0.0–0.2)

## 2019-03-06 LAB — PROTIME-INR
INR: 0.9 (ref 0.8–1.2)
Prothrombin Time: 12.3 seconds (ref 11.4–15.2)

## 2019-03-06 MED ORDER — CEFAZOLIN SODIUM-DEXTROSE 2-4 GM/100ML-% IV SOLN: 2.0000 g | Freq: Once | INTRAVENOUS | Status: DC

## 2019-03-06 MED ORDER — SODIUM CHLORIDE 0.9 % IV SOLN: INTRAVENOUS | Status: DC

## 2019-03-06 NOTE — Progress Notes (Signed)
Patient was scheduled for an image-guided T5 and T12 kyphoplasty/vertebroplasty today with Dr. Estanislado Pandy at the request of Dr. Durward Fortes.  UA today revealed large amount of leukocytes, >50 WBCs, and few bacteria. Discussed with Dr. Estanislado Pandy who recommends 3 days of antibiotics prior to proceeding with procedure. Informed patient and son that procedure will not occur today. Prescription for Ciprofloxacin 250 mg tablets (take one tablet by mouth twice daily for 3 days, dispense 6 tablets with 0 refills) given to patient/son. Informed patient/son that our schedulers will call him to reschedule this procedure.  All questions answered and concerns addressed. Patient/son convey understanding and agree with plan.   Bea Graff Jasmond [place], PA-C 03/06/2019, 1:03 PM

## 2019-03-18 ENCOUNTER — Other Ambulatory Visit: Payer: Self-pay | Admitting: Radiology

## 2019-03-19 ENCOUNTER — Other Ambulatory Visit: Payer: Self-pay | Admitting: Radiology

## 2019-03-20 ENCOUNTER — Other Ambulatory Visit: Payer: Self-pay

## 2019-03-20 ENCOUNTER — Ambulatory Visit (HOSPITAL_COMMUNITY)
Admission: RE | Admit: 2019-03-20 | Discharge: 2019-03-20 | Disposition: A | Discharge status: Home / Self Care | Payer: Medicare Other | Source: Ambulatory Visit | Attending: Interventional Radiology | Admitting: Interventional Radiology

## 2019-03-20 NOTE — Progress Notes (Signed)
IR.  Patient was scheduled for an image-guided T12 kyphoplasty/vertebroplasty this AM with Dr. Estanislado Pandy.  Unfortunately, there was an emergent Code Stroke that required Dr. Arlean Hopping immediate attention at this time. Because of this, patient's procedure will not occur today. Informed patient and son that procedure will be postponed. Informed them that our schedulers will call them to reschedule his procedure. All questions answered and concerns addressed. Patient and son convey understanding and agree with plan.  Please call IR with questions/concerns.   Bea Graff Louk, PA-C 03/20/2019, 8:36 AM

## 2019-03-23 ENCOUNTER — Other Ambulatory Visit: Payer: Self-pay | Admitting: Student

## 2019-03-24 ENCOUNTER — Other Ambulatory Visit (HOSPITAL_COMMUNITY): Payer: Self-pay | Admitting: Interventional Radiology

## 2019-03-24 ENCOUNTER — Encounter (HOSPITAL_COMMUNITY): Payer: Self-pay

## 2019-03-24 ENCOUNTER — Ambulatory Visit (HOSPITAL_COMMUNITY)
Admission: RE | Admit: 2019-03-24 | Discharge: 2019-03-24 | Disposition: A | Discharge status: Home / Self Care | Payer: Medicare Other | Source: Ambulatory Visit | Attending: Interventional Radiology | Admitting: Interventional Radiology

## 2019-03-24 ENCOUNTER — Other Ambulatory Visit: Payer: Self-pay

## 2019-03-24 DIAGNOSIS — X58XXXA Exposure to other specified factors, initial encounter: Secondary | ICD-10-CM | POA: Diagnosis not present

## 2019-03-24 DIAGNOSIS — S22080A Wedge compression fracture of T11-T12 vertebra, initial encounter for closed fracture: Secondary | ICD-10-CM

## 2019-03-24 DIAGNOSIS — Z79899 Other long term (current) drug therapy: Secondary | ICD-10-CM | POA: Insufficient documentation

## 2019-03-24 DIAGNOSIS — Z87891 Personal history of nicotine dependence: Secondary | ICD-10-CM | POA: Insufficient documentation

## 2019-03-24 DIAGNOSIS — I10 Essential (primary) hypertension: Secondary | ICD-10-CM | POA: Insufficient documentation

## 2019-03-24 DIAGNOSIS — Z8546 Personal history of malignant neoplasm of prostate: Secondary | ICD-10-CM | POA: Insufficient documentation

## 2019-03-24 HISTORY — PX: IR VERTEBROPLASTY CERV/THOR BX INC UNI/BIL INC/INJECT/IMAGING: IMG5515

## 2019-03-24 LAB — URINALYSIS, ROUTINE W REFLEX MICROSCOPIC
Bilirubin Urine: NEGATIVE
Glucose, UA: NEGATIVE mg/dL
Hgb urine dipstick: NEGATIVE
Ketones, ur: NEGATIVE mg/dL
Leukocytes,Ua: NEGATIVE
Nitrite: NEGATIVE
Protein, ur: NEGATIVE mg/dL
Specific Gravity, Urine: 1.008 (ref 1.005–1.030)
pH: 8 (ref 5.0–8.0)

## 2019-03-24 LAB — CBC WITH DIFFERENTIAL/PLATELET
Abs Immature Granulocytes: 0.02 10*3/uL (ref 0.00–0.07)
Basophils Absolute: 0 10*3/uL (ref 0.0–0.1)
Basophils Relative: 1 %
Eosinophils Absolute: 0.1 10*3/uL (ref 0.0–0.5)
Eosinophils Relative: 2 %
HCT: 41.4 % (ref 39.0–52.0)
Hemoglobin: 13.2 g/dL (ref 13.0–17.0)
Immature Granulocytes: 0 %
Lymphocytes Relative: 21 %
Lymphs Abs: 1.1 10*3/uL (ref 0.7–4.0)
MCH: 29.9 pg (ref 26.0–34.0)
MCHC: 31.9 g/dL (ref 30.0–36.0)
MCV: 93.7 fL (ref 80.0–100.0)
Monocytes Absolute: 0.3 10*3/uL (ref 0.1–1.0)
Monocytes Relative: 7 %
Neutro Abs: 3.4 10*3/uL (ref 1.7–7.7)
Neutrophils Relative %: 69 %
Platelets: 185 10*3/uL (ref 150–400)
RBC: 4.42 MIL/uL (ref 4.22–5.81)
RDW: 16 % — ABNORMAL HIGH (ref 11.5–15.5)
WBC: 5 10*3/uL (ref 4.0–10.5)
nRBC: 0 % (ref 0.0–0.2)

## 2019-03-24 LAB — BASIC METABOLIC PANEL
Anion gap: 11 (ref 5–15)
BUN: 21 mg/dL (ref 8–23)
CO2: 26 mmol/L (ref 22–32)
Calcium: 10 mg/dL (ref 8.9–10.3)
Chloride: 103 mmol/L (ref 98–111)
Creatinine, Ser: 1.38 mg/dL — ABNORMAL HIGH (ref 0.61–1.24)
GFR calc Af Amer: 53 mL/min — ABNORMAL LOW (ref >60)
GFR calc non Af Amer: 45 mL/min — ABNORMAL LOW (ref >60)
Glucose, Bld: 99 mg/dL (ref 70–99)
Potassium: 4.6 mmol/L (ref 3.5–5.1)
Sodium: 140 mmol/L (ref 135–145)

## 2019-03-24 LAB — PROTIME-INR
INR: 1 (ref 0.8–1.2)
Prothrombin Time: 12.9 seconds (ref 11.4–15.2)

## 2019-03-24 MED ORDER — FENTANYL CITRATE (PF) 100 MCG/2ML IJ SOLN
INTRAMUSCULAR | Status: AC | PRN
  Administered 2019-03-24: 25 ug via INTRAVENOUS

## 2019-03-24 MED ORDER — FENTANYL CITRATE (PF) 100 MCG/2ML IJ SOLN
INTRAMUSCULAR | Status: AC
  Filled 2019-03-24: qty 2

## 2019-03-24 MED ORDER — CEFAZOLIN SODIUM-DEXTROSE 2-4 GM/100ML-% IV SOLN
2.0000 g | INTRAVENOUS | Status: AC
  Administered 2019-03-24: 12:00:00 2 g via INTRAVENOUS

## 2019-03-24 MED ORDER — SODIUM CHLORIDE 0.9 % IV SOLN: INTRAVENOUS | Status: AC

## 2019-03-24 MED ORDER — CEFAZOLIN SODIUM-DEXTROSE 2-4 GM/100ML-% IV SOLN
INTRAVENOUS | Status: AC
  Administered 2019-03-24: 2 g via INTRAVENOUS
  Filled 2019-03-24: qty 100

## 2019-03-24 MED ORDER — SODIUM CHLORIDE 0.9 % IV SOLN
INTRAVENOUS | Status: DC
  Administered 2019-03-24: 11:00:00 via INTRAVENOUS

## 2019-03-24 MED ORDER — TOBRAMYCIN SULFATE 1.2 G IJ SOLR
INTRAMUSCULAR | Status: AC
  Filled 2019-03-24: qty 1.2

## 2019-03-24 MED ORDER — HYDRALAZINE HCL 20 MG/ML IJ SOLN
INTRAMUSCULAR | Status: AC
  Filled 2019-03-24: qty 1

## 2019-03-24 MED ORDER — HYDRALAZINE HCL 20 MG/ML IJ SOLN
INTRAMUSCULAR | Status: AC
  Administered 2019-03-24: 11:00:00 5 mg via INTRAVENOUS
  Filled 2019-03-24: qty 1

## 2019-03-24 MED ORDER — IOHEXOL 300 MG/ML  SOLN
50.0000 mL | Freq: Once | INTRAMUSCULAR | Status: AC | PRN
  Administered 2019-03-24: 13:00:00 3 mL

## 2019-03-24 MED ORDER — GELATIN ABSORBABLE 12-7 MM EX MISC
CUTANEOUS | Status: AC
  Filled 2019-03-24: qty 1

## 2019-03-24 MED ORDER — MIDAZOLAM HCL 2 MG/2ML IJ SOLN
INTRAMUSCULAR | Status: AC | PRN
  Administered 2019-03-24: 1 mg via INTRAVENOUS

## 2019-03-24 MED ORDER — HYDRALAZINE HCL 20 MG/ML IJ SOLN
INTRAMUSCULAR | Status: AC | PRN
  Administered 2019-03-24: 5 mg via INTRAVENOUS

## 2019-03-24 MED ORDER — HYDRALAZINE HCL 20 MG/ML IJ SOLN
5.0000 mg | Freq: Once | INTRAMUSCULAR | Status: AC
  Administered 2019-03-24: 11:00:00 5 mg via INTRAVENOUS

## 2019-03-24 MED ORDER — MIDAZOLAM HCL 2 MG/2ML IJ SOLN
INTRAMUSCULAR | Status: AC
  Filled 2019-03-24: qty 2

## 2019-03-24 MED ORDER — BUPIVACAINE HCL (PF) 0.5 % IJ SOLN
INTRAMUSCULAR | Status: AC
  Filled 2019-03-24: qty 30

## 2019-03-24 NOTE — Progress Notes (Signed)
IR.  Patient was scheduled for an image-guided T12 kyphoplasty/vertebroplasty this AM with Dr. Estanislado Pandy.  BP this AM- 223/90. Patient states his BP typically runs in the 160s/90s. States that he takes Diltiazem 300 mg once daily at bedtime, which he reports he took last night. Patient asymptomatic at this time- denies headache, dizziness, or vision changes.  Discussed case with Dr. Estanislado Pandy who recommends administration of Hydralazine 5 mg IV x1 and proceed with procedure pending UA. Order placed, RN aware.   Bea Graff Kelce Bouton, PA-C 03/24/2019, 10:46 AM

## 2019-03-24 NOTE — Discharge Instructions (Addendum)
1.No stooping,bending or lifting more than 10 lbs for 2 weeks. 2.Use walker to ambulatee for 2 weeks. 3.No driving for 2 weeks   Balloon Kyphoplasty, Care After This sheet gives you information about how to care for yourself after your procedure. Your health care provider may also give you more specific instructions. If you have problems or questions, contact your health care provider. What can I expect after the procedure? After your procedure, it is common to have back pain. Follow these instructions at home: Medicines  Take over-the-counter and prescription medicines only as told by your health care provider.  Ask your health care provider if the medicine prescribed to you: ? Requires you to avoid driving or using heavy machinery. ? Can cause constipation. You may need to take steps to prevent or treat constipation, such as:  Drink enough fluid to keep your urine pale yellow.  Take over-the-counter or prescription medicines.  Eat foods that are high in fiber, such as beans, whole grains, and fresh fruits and vegetables.  Limit foods that are high in fat and processed sugars, such as fried or sweet foods. Puncture site care   Follow instructions from your health care provider about how to take care of your puncture site. Make sure you: ? Wash your hands with soap and water before and after you change your bandage (dressing). If soap and water are not available, use hand sanitizer. ? Change your dressing as told by your health care provider. ? Leave skin glue or adhesive strips in place. These skin closures may need to be in place for 2 weeks or longer. If adhesive strip edges start to loosen and curl up, you may trim the loose edges. Do not remove adhesive strips completely unless your health care provider tells you to do that.  Check your puncture site every day for signs of infection. Watch for: ? Redness, swelling, or pain. ? Fluid or blood. ? Warmth. ? Pus or a bad  smell.  Keep your dressing dry until your health care provider says that it can be removed. Managing pain, stiffness, and swelling   If directed, put ice on the painful area. ? Put ice in a plastic bag. ? Place a towel between your skin and the bag. ? Leave the ice on for 20 minutes, 2-3 times a day. Activity  Rest your back and avoid intense physical activity for as long as told by your health care provider.  Avoid bending, lifting, or twisting your back for as long as told by your health care provider.  Return to your normal activities as told by your health care provider. Ask your health care provider what activities are safe for you.  Do not lift anything that is heavier than 5 lb (2.2 kg). You may need to avoid heavy lifting for several weeks. General instructions  Do not use any products that contain nicotine or tobacco, such as cigarettes, e-cigarettes, and chewing tobacco. These can delay bone healing. If you need help quitting, ask your health care provider.  Do not drive for 24 hours if you were given a sedative during your procedure.  Keep all follow-up visits as told by your health care provider. This is important. Contact a health care provider if:  You have a fever or chills.  You have redness, swelling, or pain at the site of your puncture.  You have fluid, blood, or pus coming from the puncture site.  You have pain that gets worse or does not get better  with medicine.  You develop numbness or weakness in any part of your body. Get help right away if:  You have chest pain.  You have difficulty breathing.  You have weakness, numbness, or tingling in your legs.  You cannot control your bladder or bowel movements.  You suddenly become weak or numb on one side of your body.  You become very confused.  You have trouble speaking or understanding, or both. Summary  Follow instructions from your health care provider about how to take care of your puncture  site.  Take over-the-counter and prescription medicines only as told by your health care provider.  Rest your back and avoid intense physical activity for as long as told by your health care provider.  Contact a health care provider if you have pain that gets worse or does not get better with medicine.  Keep all follow-up visits as told by your health care provider. This is important. This information is not intended to replace advice given to you by your health care provider. Make sure you discuss any questions you have with your health care provider. Document Released: 05/18/2015 Document Revised: 08/04/2018 Document Reviewed: 08/04/2018 Elsevier Patient Education  2020 [employer].

## 2019-03-24 NOTE — H&P (Addendum)
Chief Complaint: Patient was seen in consultation today for Thoracic 12 Kyphoplasty   Referring Physician(s): Dr Cordella Register  Supervising Physician: Luanne Bras  Patient Status: [facility] - Out-pt  History of Present Illness: Jose Baldwin is a 85 y.o. male   T12 VP 01/31/18 New/Continued pain--  New imaging reveals continued area of fx per Dr Estanislado Pandy  Rescheduled for additional cement VP/KP at T12  Was scheduled 6/26-- but had UTI -- now treated Was rescheduled to 7/10-- code stroke emergency  Now scheduled for today  UA pending    Past Medical History:  Diagnosis Date  . Hypertension   . Prostate cancer ([place])   . Syncope     Past Surgical History:  Procedure Laterality Date  . BACK SURGERY    . IR VERTEBROPLASTY CERV/THOR BX INC UNI/BIL INC/INJECT/IMAGING  01/31/2018    Allergies: Patient has no known allergies.  Medications: Prior to Admission medications   Medication Sig Start Date End Date Taking? Authorizing Provider  Calcium Carbonate-Vitamin D (CALCIUM 600+D) 600-400 MG-UNIT tablet Take 1 tablet by mouth daily.   Yes [provider]  Carbidopa-Levodopa ER (RYTARY) 23.75-95 MG CPCR Take 23.75 mg by mouth 3 (three) times daily.   Yes [provider]  cloNIDine (CATAPRES - DOSED IN MG/24 HR) 0.2 mg/24hr patch Place 0.2 mg onto the skin once a week.   Yes [provider]  cloNIDine (CATAPRES) 0.2 MG tablet Take 0.2 mg by mouth 4 (four) times daily as needed (every 6 hours for high BP).   Yes [provider]  diltiazem (CARDIZEM CD) 300 MG 24 hr capsule Take 300 mg by mouth at bedtime.   Yes [provider]  feeding supplement, ENSURE ENLIVE, (ENSURE ENLIVE) LIQD Take 237 mLs by mouth 3 (three) times daily between meals. Patient taking differently: Take 237 mLs by mouth daily.  02/06/18  Yes Lacroce, Hulen Shouts, MD  fludrocortisone (FLORINEF) 0.1 MG tablet Take 2 tablets (0.2 mg total) by mouth at bedtime.  12/29/18  Yes Minus Breeding, MD  potassium chloride SA (K-DUR) 20 MEQ tablet Take 20 mEq by mouth 2 (two) times daily.   Yes [provider]  ranibizumab (LUCENTIS) 0.5 MG/0.05ML SOLN 0.5 mg by Intravitreal route once. Every 8 weeks   Yes [provider]  thiamine (VITAMIN B-1) 100 MG tablet Take 100 mg by mouth daily.   Yes [provider]     Family History  Problem Relation Age of Onset  . Heart attack Father 23    Social History   Socioeconomic History  . Marital status: Single    Spouse name: Not on file  . Number of children: 3  . Years of education: Not on file  . Highest education level: Not on file  Occupational History  . Occupation: retired  Scientific laboratory technician  . Financial resource strain: Not on file  . Food insecurity    Worry: Not on file    Inability: Not on file  . Transportation needs    Medical: Not on file    Non-medical: Not on file  Tobacco Use  . Smoking status: Former Smoker    Packs/day: 2.00    Years: 30.00    Pack years: 60.00    Types: Cigarettes    Quit date: 1990    Years since quitting: 30.5  . Smokeless tobacco: Never Used  Substance and Sexual Activity  . Alcohol use: Yes    Alcohol/week: 14.0 standard drinks    Types: 14  Cans of beer per week  . Drug use: Never  . Sexual activity: Not on file  Lifestyle  . Physical activity    Days per week: Not on file    Minutes per session: Not on file  . Stress: Not on file  Relationships  . Social Herbalist on phone: Not on file    Gets together: Not on file    Attends religious service: Not on file    Active member of club or organization: Not on file    Attends meetings of clubs or organizations: Not on file    Relationship status: Not on file  Other Topics Concern  . Not on file  Social History Narrative  . Not on file    Review of Systems: A 12 point ROS discussed and pertinent positives are indicated in the HPI above.  All other systems are  negative.  Review of Systems  Constitutional: Positive for appetite change. Negative for fatigue.  HENT: Positive for hearing loss.   Gastrointestinal: Negative for abdominal pain.  Musculoskeletal: Positive for back pain and gait problem.  Neurological: Positive for weakness.  Psychiatric/Behavioral: Negative for behavioral problems and confusion.    Vital Signs: BP (!) 223/98   Pulse 71   Temp (!) 97.1 F (36.2 C)   Resp 18   SpO2 100%   Physical Exam Vitals signs reviewed.  Cardiovascular:     Rate and Rhythm: Normal rate and regular rhythm.     Heart sounds: Normal heart sounds.  Pulmonary:     Effort: Pulmonary effort is normal.     Breath sounds: Normal breath sounds.  Abdominal:     Palpations: Abdomen is soft.  Musculoskeletal: Normal range of motion.  Skin:    General: Skin is warm and dry.  Neurological:     Mental Status: He is alert and oriented to person, place, and time.  Psychiatric:        Mood and Affect: Mood normal.        Behavior: Behavior normal.     Comments: Consented with POA- Son in room     Imaging: No results found.  Labs:  CBC: Recent Labs    08/13/18 1123 03/06/19 1238  WBC 5.7 6.4  HGB 10.5* 12.6*  HCT 31.7* 38.6*  PLT 145* 133*    COAGS: Recent Labs    03/06/19 1238 03/24/19 1017  INR 0.9 1.0    BMP: No results for input(s): NA, K, CL, CO2, GLUCOSE, BUN, CALCIUM, CREATININE, GFRNONAA, GFRAA in the last 8760 hours.  Invalid input(s): CMP  LIVER FUNCTION TESTS: No results for input(s): BILITOT, AST, ALT, ALKPHOS, PROT, ALBUMIN in the last 8760 hours.  TUMOR MARKERS: No results for input(s): AFPTM, CEA, CA199, CHROMGRNA in the last 8760 hours.  Assessment and Plan:  T12 fracture VP 01/31/18-- new/continued pain For VP/KP - additional cement at T12 level today Risks and benefits of Thoracic 12 KP were discussed with the patient including, but not limited to education regarding the natural healing process of  compression fractures without intervention, bleeding, infection, cement migration which may cause spinal cord damage, paralysis, pulmonary embolism or even death.  This interventional procedure involves the use of X-rays and because of the nature of the planned procedure, it is possible that we will have prolonged use of X-ray fluoroscopy.  Potential radiation risks to you include (but are not limited to) the following: - A slightly elevated risk for cancer  several years later in life.  This risk is typically less than 0.5% percent. This risk is low in comparison to the normal incidence of human cancer, which is 33% for women and 50% for men according to the [place]. - Radiation induced injury can include skin redness, resembling a rash, tissue breakdown / ulcers and hair loss (which can be temporary or permanent).   The likelihood of either of these occurring depends on the difficulty of the procedure and whether you are sensitive to radiation due to previous procedures, disease, or genetic conditions.   IF your procedure requires a prolonged use of radiation, you will be notified and given written instructions for further action.  It is your responsibility to monitor the irradiated area for the 2 weeks following the procedure and to notify your physician if you are concerned that you have suffered a radiation induced injury.    All of the patient's questions were answered, patient is agreeable to proceed.  Consent signed and in chart.  Thank you for this interesting consult.  I greatly enjoyed meeting Jose Baldwin and look forward to participating in their care.  A copy of this report was sent to the requesting provider on this date.  Electronically Signed: Lavonia Drafts, PA-C 03/24/2019, 11:05 AM   I spent a total of    25 Minutes in face to face in clinical consultation, greater than 50% of which was counseling/coordinating care for T12 KP

## 2019-03-24 NOTE — Procedures (Signed)
S/P T 12 VP with biopsy

## 2019-03-25 LAB — URINE CULTURE

## 2019-03-26 ENCOUNTER — Encounter (HOSPITAL_COMMUNITY): Payer: Self-pay | Admitting: Interventional Radiology

## 2019-03-28 ENCOUNTER — Encounter (HOSPITAL_COMMUNITY): Payer: Self-pay | Admitting: *Deleted

## 2019-03-28 ENCOUNTER — Other Ambulatory Visit: Payer: Self-pay

## 2019-03-28 ENCOUNTER — Inpatient Hospital Stay (HOSPITAL_COMMUNITY)
Admission: EM | Admit: 2019-03-28 | Discharge: 2019-04-01 | DRG: 064 | Disposition: A | Discharge status: Home / Self Care | Payer: Medicare Other | Attending: Family Medicine | Admitting: Family Medicine

## 2019-03-28 DIAGNOSIS — I700 Atherosclerosis of aorta: Secondary | ICD-10-CM | POA: Diagnosis present

## 2019-03-28 DIAGNOSIS — I6523 Occlusion and stenosis of bilateral carotid arteries: Secondary | ICD-10-CM | POA: Diagnosis present

## 2019-03-28 DIAGNOSIS — I10 Essential (primary) hypertension: Secondary | ICD-10-CM | POA: Diagnosis present

## 2019-03-28 DIAGNOSIS — I951 Orthostatic hypotension: Secondary | ICD-10-CM | POA: Diagnosis not present

## 2019-03-28 DIAGNOSIS — R55 Syncope and collapse: Secondary | ICD-10-CM | POA: Diagnosis not present

## 2019-03-28 DIAGNOSIS — I6340 Cerebral infarction due to embolism of unspecified cerebral artery: Secondary | ICD-10-CM | POA: Diagnosis not present

## 2019-03-28 DIAGNOSIS — G20 Parkinson's disease: Secondary | ICD-10-CM | POA: Diagnosis present

## 2019-03-28 DIAGNOSIS — D696 Thrombocytopenia, unspecified: Secondary | ICD-10-CM | POA: Diagnosis present

## 2019-03-28 DIAGNOSIS — Z1159 Encounter for screening for other viral diseases: Secondary | ICD-10-CM | POA: Diagnosis not present

## 2019-03-28 DIAGNOSIS — D509 Iron deficiency anemia, unspecified: Secondary | ICD-10-CM | POA: Diagnosis present

## 2019-03-28 DIAGNOSIS — Z87891 Personal history of nicotine dependence: Secondary | ICD-10-CM | POA: Diagnosis not present

## 2019-03-28 DIAGNOSIS — I6381 Other cerebral infarction due to occlusion or stenosis of small artery: Principal | ICD-10-CM | POA: Diagnosis present

## 2019-03-28 DIAGNOSIS — R001 Bradycardia, unspecified: Secondary | ICD-10-CM | POA: Diagnosis present

## 2019-03-28 DIAGNOSIS — Z8249 Family history of ischemic heart disease and other diseases of the circulatory system: Secondary | ICD-10-CM

## 2019-03-28 DIAGNOSIS — G90A Postural orthostatic tachycardia syndrome (POTS): Secondary | ICD-10-CM | POA: Diagnosis present

## 2019-03-28 DIAGNOSIS — R000 Tachycardia, unspecified: Secondary | ICD-10-CM | POA: Diagnosis not present

## 2019-03-28 DIAGNOSIS — I472 Ventricular tachycardia: Secondary | ICD-10-CM | POA: Diagnosis not present

## 2019-03-28 DIAGNOSIS — Z8546 Personal history of malignant neoplasm of prostate: Secondary | ICD-10-CM | POA: Diagnosis not present

## 2019-03-28 DIAGNOSIS — G903 Multi-system degeneration of the autonomic nervous system: Secondary | ICD-10-CM | POA: Diagnosis present

## 2019-03-28 DIAGNOSIS — R4020 Unspecified coma: Secondary | ICD-10-CM | POA: Diagnosis not present

## 2019-03-28 DIAGNOSIS — R482 Apraxia: Secondary | ICD-10-CM | POA: Diagnosis present

## 2019-03-28 DIAGNOSIS — Z681 Body mass index (BMI) 19 or less, adult: Secondary | ICD-10-CM

## 2019-03-28 DIAGNOSIS — I672 Cerebral atherosclerosis: Secondary | ICD-10-CM | POA: Diagnosis present

## 2019-03-28 DIAGNOSIS — I495 Sick sinus syndrome: Secondary | ICD-10-CM | POA: Diagnosis present

## 2019-03-28 DIAGNOSIS — E869 Volume depletion, unspecified: Secondary | ICD-10-CM | POA: Diagnosis present

## 2019-03-28 DIAGNOSIS — I447 Left bundle-branch block, unspecified: Secondary | ICD-10-CM | POA: Diagnosis present

## 2019-03-28 DIAGNOSIS — E43 Unspecified severe protein-calorie malnutrition: Secondary | ICD-10-CM | POA: Diagnosis present

## 2019-03-28 DIAGNOSIS — Z79899 Other long term (current) drug therapy: Secondary | ICD-10-CM | POA: Diagnosis not present

## 2019-03-28 DIAGNOSIS — I639 Cerebral infarction, unspecified: Secondary | ICD-10-CM | POA: Diagnosis not present

## 2019-03-28 DIAGNOSIS — C61 Malignant neoplasm of prostate: Secondary | ICD-10-CM | POA: Diagnosis present

## 2019-03-28 LAB — URINALYSIS, ROUTINE W REFLEX MICROSCOPIC
Bilirubin Urine: NEGATIVE
Glucose, UA: NEGATIVE mg/dL
Hgb urine dipstick: NEGATIVE
Ketones, ur: NEGATIVE mg/dL
Leukocytes,Ua: NEGATIVE
Nitrite: NEGATIVE
Protein, ur: NEGATIVE mg/dL
Specific Gravity, Urine: 1.014 (ref 1.005–1.030)
pH: 6 (ref 5.0–8.0)

## 2019-03-28 LAB — CBC
HCT: 34 % — ABNORMAL LOW (ref 39.0–52.0)
Hemoglobin: 10.8 g/dL — ABNORMAL LOW (ref 13.0–17.0)
MCH: 29.5 pg (ref 26.0–34.0)
MCHC: 31.8 g/dL (ref 30.0–36.0)
MCV: 92.9 fL (ref 80.0–100.0)
Platelets: 164 10*3/uL (ref 150–400)
RBC: 3.66 MIL/uL — ABNORMAL LOW (ref 4.22–5.81)
RDW: 15.8 % — ABNORMAL HIGH (ref 11.5–15.5)
WBC: 5.7 10*3/uL (ref 4.0–10.5)
nRBC: 0 % (ref 0.0–0.2)

## 2019-03-28 LAB — BASIC METABOLIC PANEL
Anion gap: 9 (ref 5–15)
BUN: 29 mg/dL — ABNORMAL HIGH (ref 8–23)
CO2: 25 mmol/L (ref 22–32)
Calcium: 9.2 mg/dL (ref 8.9–10.3)
Chloride: 104 mmol/L (ref 98–111)
Creatinine, Ser: 1.58 mg/dL — ABNORMAL HIGH (ref 0.61–1.24)
GFR calc Af Amer: 45 mL/min — ABNORMAL LOW (ref >60)
GFR calc non Af Amer: 38 mL/min — ABNORMAL LOW (ref >60)
Glucose, Bld: 107 mg/dL — ABNORMAL HIGH (ref 70–99)
Potassium: 4 mmol/L (ref 3.5–5.1)
Sodium: 138 mmol/L (ref 135–145)

## 2019-03-28 LAB — TSH: TSH: 1.66 u[IU]/mL (ref 0.350–4.500)

## 2019-03-28 LAB — SARS CORONAVIRUS 2 BY RT PCR (HOSPITAL ORDER, PERFORMED IN ~~LOC~~ HOSPITAL LAB): SARS Coronavirus 2: NEGATIVE

## 2019-03-28 MED ORDER — SODIUM CHLORIDE 0.9% FLUSH
3.0000 mL | Freq: Two times a day (BID) | INTRAVENOUS | Status: DC
  Administered 2019-03-30 – 2019-04-01 (×4): 3 mL via INTRAVENOUS

## 2019-03-28 MED ORDER — SODIUM CHLORIDE 0.9 % IV SOLN
INTRAVENOUS | Status: DC
  Administered 2019-03-28 – 2019-03-29 (×2): via INTRAVENOUS

## 2019-03-28 MED ORDER — RANIBIZUMAB 0.5 MG/0.05ML IO SOLN: 0.5000 mg | Freq: Once | INTRAOCULAR | Status: DC

## 2019-03-28 MED ORDER — ENOXAPARIN SODIUM 30 MG/0.3ML ~~LOC~~ SOLN
30.0000 mg | SUBCUTANEOUS | Status: DC
  Administered 2019-03-28 – 2019-03-29 (×2): 30 mg via SUBCUTANEOUS
  Filled 2019-03-28 (×2): qty 0.3

## 2019-03-28 MED ORDER — CARBIDOPA-LEVODOPA 25-100 MG PO TABS
1.0000 | ORAL_TABLET | Freq: Three times a day (TID) | ORAL | Status: DC
  Administered 2019-03-28 – 2019-04-01 (×10): 1 via ORAL
  Filled 2019-03-28 (×10): qty 1

## 2019-03-28 MED ORDER — CARBIDOPA-LEVODOPA ER 23.75-95 MG PO CPCR: 23.7500 mg | ORAL_CAPSULE | Freq: Three times a day (TID) | ORAL | Status: DC

## 2019-03-28 MED ORDER — CLONIDINE HCL 0.2 MG/24HR TD PTWK: 0.2000 mg | MEDICATED_PATCH | TRANSDERMAL | Status: DC

## 2019-03-28 MED ORDER — CARBIDOPA-LEVODOPA 25-100 MG PO TABS: 1.0000 | ORAL_TABLET | Freq: Three times a day (TID) | ORAL | Status: DC

## 2019-03-28 NOTE — ED Triage Notes (Signed)
PT 's Son reports he has video  Of his Father having seizures . Possible 2 seizures today and one on Friday. Pt went WD. Night to [place] because Pt 's  [place] after surgery.

## 2019-03-28 NOTE — H&P (Addendum)
History and Physical  Jose Baldwin XNA:[id number] DOB: 08/20/1929 DOA: 03/28/2019  PCP: Billie Ruddy, MD   Chief Complaint: Syncope and also?  Seizure  HPI:  85 year old male Prior syncope + lightheadedness since 09/2017 Chronic LBBB Carotid bruit In the past has been prodromal and patient was seen by Dr. Percival Spanish for this as well as Dr. Domenic Polite Episodes are that he suddenly just falls down and becomes unresponsive and then has tremors This patient was admitted 01/2018 with unresponsive orthostatic hypotension despite discontinuation of multiple antihypertensives needed compression hose abdominal binder and 5 L of IV fluid in addition to Midodrin Prostate cancer in the past There is mention notes of tachycardia and tachybradycardia syndrome  Patient returns to the hospital 7/18 after running out of trial medication Rytary ER [carbi-levodopa] 3 times a day---he has allegedly has 2-3 episodes of "seizure-like" activity---when asked to describe this further it looks like he has had rigidity and freezing after changing posture according to son and he has not been able to do his usual activities  Recently Rx Rytaryby a neurologist?  Dr. [place] Breed, [place] around the time of his kyphoplasty  recently was tapered off of it after the kyphoplasty according to son and has been doing poorly since that time for the past week It appears that he is taking blood pressure meds Cardizem and clonidine after recent discharge from a skilled nursing facility maybe about 2 months ago when he was admitted at the [facility] for similar orthostatic issues  - Fever, - chills, - blurred vision, - double vision, - chest pain, - cough, - cold, - ill contacts, - abdominal pain, - nausea, - vomiting, - rash, - lower extremity edema  COVID testing is pending at this time  Emergency room work-up showed BUN/creatinine above usual norms, TSH 1.66, urinalysis negative, no white count CT was deferred as he  has had one recently  ED Course: Patient started at my recommendation on saline 50 cc/h, coronavirus test is pending, orthostatics were attempted however not able to be accomplished as patient was feeling too dizzy  Review of Systems: As above  Past Medical History:  Diagnosis Date  . Hypertension   . Prostate cancer ([place])   . Syncope     Past Surgical History:  Procedure Laterality Date  . BACK SURGERY    . IR VERTEBROPLASTY CERV/THOR BX INC UNI/BIL INC/INJECT/IMAGING  01/31/2018  . IR VERTEBROPLASTY CERV/THOR BX INC UNI/BIL INC/INJECT/IMAGING  03/24/2019     reports that he quit smoking about 30 years ago. His smoking use included cigarettes. He has a 60.00 pack-year smoking history. He has never used smokeless tobacco. He reports current alcohol use of about 14.0 standard drinks of alcohol per week. He reports that he does not use drugs. Mobility: Up to last week when he was on the trial medication he was able to ambulate with a walker and was pretty independent-for the past week he has been living at his son's house as he has been relatively immobile   No Known Allergies  Family History  Problem Relation Age of Onset  . Heart attack Father 6     Prior to Admission medications   Medication Sig Start Date End Date Taking? Authorizing Provider  Calcium Carbonate-Vitamin D (CALCIUM 600+D) 600-400 MG-UNIT tablet Take 1 tablet by mouth daily.    [provider]  Carbidopa-Levodopa ER (RYTARY) 23.75-95 MG CPCR Take 23.75 mg by mouth 3 (three) times daily.    [provider]  cloNIDine (CATAPRES - DOSED IN MG/24 HR) 0.2 mg/24hr patch Place 0.2 mg onto the skin once a week.    [provider]  cloNIDine (CATAPRES) 0.2 MG tablet Take 0.2 mg by mouth 4 (four) times daily as needed (every 6 hours for high BP).    [provider]  diltiazem (CARDIZEM CD) 300 MG 24 hr capsule Take 300 mg by mouth at bedtime.    [provider]  feeding  supplement, ENSURE ENLIVE, (ENSURE ENLIVE) LIQD Take 237 mLs by mouth 3 (three) times daily between meals. Patient taking differently: Take 237 mLs by mouth daily.  02/06/18   Lacroce, Hulen Shouts, MD  fludrocortisone (FLORINEF) 0.1 MG tablet Take 2 tablets (0.2 mg total) by mouth at bedtime. 12/29/18   Minus Breeding, MD  potassium chloride SA (K-DUR) 20 MEQ tablet Take 20 mEq by mouth 2 (two) times daily.    [provider]  ranibizumab (LUCENTIS) 0.5 MG/0.05ML SOLN 0.5 mg by Intravitreal route once. Every 8 weeks    [provider]  thiamine (VITAMIN B-1) 100 MG tablet Take 100 mg by mouth daily.    [provider]    Physical Exam:   Awake pleasant oriented frail appearing Caucasian male bitemporal wasting poor dentition tongue midline uvula midline  Chest is clinically clear he does have protrusion of his spinous processes given his frailty  Mild wheeze bilaterally posterolaterally  No JVD, no thyromegaly or submandibular lymphadenopathy  S1-S2 bradycardic cannot appreciate murmur  Abdomen soft no rebound no guarding  Motor 5/5 upper extremities lower extremities major muscle groups  Reflexes in knees 2/3  Sensory grossly intact  He has multiple areas over his skin that seem bruised  I have personally reviewed following labs and imaging studies  Labs:   BUNs/creatinine up from baseline of 23/1.1-->29/1.5  Hemoglobin is down to 10.8 from earlier this month 13.2  Platelets of 164  Imaging studies:   None performed  Medical tests:   EKG independently reviewed: Bradycardia deep S waves across precordium complete LBB B compared to prior EKG there is a rate change however no significant ST-T wave changes  Test discussed with performing physician:  Discussed with emergency physician Dr. 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 etc. to neurology Component of possible volume depletion orthostasis Saline 50 cc/h for now In the past he has required TED hose and abdominal binder I will see if we can defer abdominal binder but would recommend TED hose at this time Would discontinue completely antihypertensives except for clonidine 0.2 4 times daily every 6 for high blood pressures and would hold Cardizem-he does not have any history of arrhythmia but we will watch him Questionable tachybradycardia syndrome Hard to determine if this is the case as he is on Cardizem 300-discontinue the same-keep on monitors-if not he may need tilt table testing and cardiology input Prior prostate cancer Records unavailable to me-follow-up outpatient with his primary Iron deficiency anemia Mild thrombocytopenia Likely related to malnutrition-iron studies  last year around this time showed decrease saturations and iron levels-he is however normocytic at this time We will recheck iron saturations as he is dropped his hematocrit since 1 week ago from 13-10 which may just be re-equilibration Repeat labs a.m. Unclear if he ever had a colonoscopy as was recommended in the past Recent kyphoplasty T12 per Dr. Estanislado Pandy Stable at this time    Severity of Illness: The appropriate patient status for this patient is INPATIENT. Inpatient status is judged to be reasonable and necessary in order to provide the required intensity of service to ensure the patient's safety. The patient's presenting symptoms, physical exam findings, and initial radiographic and laboratory data  in the context of their chronic comorbidities is felt to place them at high risk for further clinical deterioration. Furthermore, it is not anticipated that the patient will be medically stable for discharge from the hospital within 2 midnights of admission. The following factors support the patient status of inpatient.   " The patient's presenting symptoms include syncope and seizure-like activity. " The worrisome physical exam findings include falls. " The initial radiographic and laboratory data are worrisome because of EKG shows bradycardia into the 40s. " The chronic co-morbidities include multiple.   * I certify that at the point of admission it is my clinical judgment that the patient will require inpatient hospital care spanning beyond 2 midnights from the point of admission due to high intensity of service, high risk for further deterioration and high frequency of surveillance required.*     DVT prophylaxis: Lovenox Code Status: Full code confirmed at the bedside Family Communication: Son at the bedside Consults called: Neurology  Time spent: 55 minutes Verneita Griffes, MD [facility] 3:48 PM   03/28/2019, 3:07 PM

## 2019-03-28 NOTE — ED Notes (Signed)
ED TO INPATIENT HANDOFF REPORT  ED Nurse Name and Phone #:   S Name/Age/Gender Jose Baldwin 85 y.o. male Room/Bed: 029C/029C  Code Status   Code Status: Prior  Home/SNF/Other Home Patient oriented to: self, place, time and situation Is this baseline? Yes   Triage Complete: Triage complete  Chief Complaint seizures  Triage Note PT 's Son reports he has video  Of his Father having seizures . Possible 2 seizures today and one on Friday. Pt went WD. Night to [place] because Pt 's  [place] after surgery.   Allergies No Known Allergies  Level of Care/Admitting Diagnosis ED Disposition    ED Disposition Condition Comment   Admit  Hospital Area: [facility] [[id number]]  Level of Care: Telemetry Cardiac [103]  Covid Evaluation: Person Under Investigation (PUI)  Diagnosis: POTS (postural orthostatic tachycardia syndrome) [073710]  Admitting Physician: Durward Parcel  Attending Physician: Nita Sells [phone]  Estimated length of stay: 3 - 4 days  Certification:: I certify this patient will need inpatient services for at least 2 midnights  PT Class (Do Not Modify): Inpatient [101]  PT Acc Code (Do Not Modify): Private [1]       B Medical/Surgery History Past Medical History:  Diagnosis Date  . Hypertension   . Prostate cancer ([place])   . Syncope    Past Surgical History:  Procedure Laterality Date  . BACK SURGERY    . IR VERTEBROPLASTY CERV/THOR BX INC UNI/BIL INC/INJECT/IMAGING  01/31/2018  . IR VERTEBROPLASTY CERV/THOR BX INC UNI/BIL INC/INJECT/IMAGING  03/24/2019     A IV Location/Drains/Wounds Patient Lines/Drains/Airways Status   Active Line/Drains/Airways    Name:   Placement date:   Placement time:   Site:   Days:   Incision (Closed) 01/31/18 Back Medial;Mid   01/31/18    1124     421   Incision (Closed) 03/24/19 Back Mid   03/24/19    1248     4          Intake/Output Last 24 hours No intake or output data in  the 24 hours ending 03/28/19 1756  Labs/Imaging Results for orders placed or performed during the hospital encounter of 03/28/19 (from the past 48 hour(s))  Basic metabolic panel     Status: Abnormal   Collection Time: 03/28/19  1:44 PM  Result Value Ref Range   Sodium 138 135 - 145 mmol/L   Potassium 4.0 3.5 - 5.1 mmol/L   Chloride 104 98 - 111 mmol/L   CO2 25 22 - 32 mmol/L   Glucose, Bld 107 (H) 70 - 99 mg/dL   BUN 29 (H) 8 - 23 mg/dL   Creatinine, Ser 1.58 (H) 0.61 - 1.24 mg/dL   Calcium 9.2 8.9 - 10.3 mg/dL   GFR calc non Af Amer 38 (L) >60 mL/min   GFR calc Af Amer 45 (L) >60 mL/min   Anion gap 9 5 - 15    Comment: Performed at [facility], [address] [place], [place] [address]  CBC     Status: Abnormal   Collection Time: 03/28/19  1:44 PM  Result Value Ref Range   WBC 5.7 4.0 - 10.5 K/uL   RBC 3.66 (L) 4.22 - 5.81 MIL/uL   Hemoglobin 10.8 (L) 13.0 - 17.0 g/dL   HCT 34.0 (L) 39.0 - 52.0 %   MCV 92.9 80.0 - 100.0 fL   MCH 29.5 26.0 - 34.0 pg   MCHC 31.8 30.0 -  36.0 g/dL   RDW 15.8 (H) 11.5 - 15.5 %   Platelets 164 150 - 400 K/uL   nRBC 0.0 0.0 - 0.2 %    Comment: Performed at [facility], [place] [address] [place], [place] [address]  TSH     Status: None   Collection Time: 03/28/19  1:44 PM  Result Value Ref Range   TSH 1.660 0.350 - 4.500 uIU/mL    Comment: Performed by a 3rd Generation assay with a functional sensitivity of <=0.01 uIU/mL. Performed at [facility], [place] [address], [place], [place] [address]   Urinalysis, Routine w reflex microscopic     Status: None   Collection Time: 03/28/19  3:10 PM  Result Value Ref Range   Color, Urine YELLOW YELLOW   APPearance CLEAR CLEAR   Specific Gravity, Urine 1.014 1.005 - 1.030   pH 6.0 5.0 - 8.0   Glucose, UA NEGATIVE NEGATIVE mg/dL   Hgb urine dipstick NEGATIVE NEGATIVE   Bilirubin Urine NEGATIVE NEGATIVE   Ketones, ur NEGATIVE NEGATIVE mg/dL   Protein, ur NEGATIVE NEGATIVE mg/dL    Nitrite NEGATIVE NEGATIVE   Leukocytes,Ua NEGATIVE NEGATIVE    Comment: Performed at [place] [address], [place], [place] [address]  SARS Coronavirus 2 (CEPHEID - Performed in [place] hospital lab), Hosp Order     Status: None   Collection Time: 03/28/19  3:23 PM   Specimen: Nasopharyngeal Swab  Result Value Ref Range   SARS Coronavirus 2 NEGATIVE NEGATIVE    Comment: (NOTE) If result is NEGATIVE SARS-CoV-2 target nucleic acids are NOT DETECTED. The SARS-CoV-2 RNA is generally detectable in upper and lower  respiratory specimens during the acute phase of infection. The lowest  concentration of SARS-CoV-2 viral copies this assay can detect is 250  copies / mL. A negative result does not preclude SARS-CoV-2 infection  and should not be used as the sole basis for treatment or other  patient management decisions.  A negative result may occur with  improper specimen collection / handling, submission of specimen other  than nasopharyngeal swab, presence of viral mutation(s) within the  areas targeted by this assay, and inadequate number of viral copies  (<250 copies / mL). A negative result must be combined with clinical  observations, patient history, and epidemiological information. If result is POSITIVE SARS-CoV-2 target nucleic acids are DETECTED. The SARS-CoV-2 RNA is generally detectable in upper and lower  respiratory specimens dur ing the acute phase of infection.  Positive  results are indicative of active infection with SARS-CoV-2.  Clinical  correlation with patient history and other diagnostic information is  necessary to determine patient infection status.  Positive results do  not rule out bacterial infection or co-infection with other viruses. If result is PRESUMPTIVE POSTIVE SARS-CoV-2 nucleic acids MAY BE PRESENT.   A presumptive positive result was obtained on the submitted specimen  and confirmed on repeat testing.  While 2019 novel coronavirus   (SARS-CoV-2) nucleic acids may be present in the submitted sample  additional confirmatory testing may be necessary for epidemiological  and / or clinical management purposes  to differentiate between  SARS-CoV-2 and other Sarbecovirus currently known to infect humans.  If clinically indicated additional testing with an alternate test  methodology [phone]) is advised. The SARS-CoV-2 RNA is generally  detectable in upper and lower respiratory sp ecimens during the acute  phase of infection. The expected result is Negative. Fact Sheet for Patients:  StrictlyIdeas.no Fact  Sheet for Healthcare Providers: BankingDealers.co.za This test is not yet approved or cleared by the [place] and has been authorized for detection and/or diagnosis of SARS-CoV-2 by FDA under an Emergency Use Authorization (EUA).  This EUA will remain in effect (meaning this test can be used) for the duration of the COVID-19 declaration under Section 564(b)(1) of the Act, 21 U.S.C. section 360bbb-3(b)(1), unless the authorization is terminated or revoked sooner. Performed at [facility], [place] [address] [place], [place] [address]    No results found.  Pending Labs [employer] (From admission, onward)    Start     Ordered   Signed and Held  CBC  (enoxaparin (LOVENOX)    CrCl < 30 ml/min)  Once,   R    Comments: Baseline for enoxaparin therapy IF NOT ALREADY DRAWN.  Notify MD if PLT < 100 K.    Signed and Held   Signed and Held  Creatinine, serum  (enoxaparin (LOVENOX)    CrCl < 30 ml/min)  Once,   R    Comments: Baseline for enoxaparin therapy IF NOT ALREADY DRAWN.    Signed and Held   Signed and Held  Creatinine, serum  (enoxaparin (LOVENOX)    CrCl < 30 ml/min)  Weekly,   R    Comments: while on enoxaparin therapy.    Signed and Held   Signed and Held  Basic metabolic panel  Tomorrow morning,   R     Signed and Held           Vitals/Pain Today's Vitals   03/28/19 1615 03/28/19 1630 03/28/19 1715 03/28/19 1745  BP: (!) 197/81 (!) 169/78 (!) 169/76 (!) 183/78  Pulse: (!) 49 66    Resp: 14 16 13 12   Temp:      SpO2: 98% 98%    Weight:      Height:      PainSc:        Isolation Precautions No active isolations  Medications Medications  0.9 %  sodium chloride infusion (has no administration in time range)  carbidopa-levodopa (SINEMET IR) 25-100 MG per tablet immediate release 1 tablet (has no administration in time range)    Mobility manual wheelchair High fall risk   Focused Assessments Cardiac Assessment Handoff:    Lab Results  Component Value Date   TROPONINI 0.04 ([place]) 01/24/2018   No results found for: DDIMER Does the Patient currently have chest pain? No     R Recommendations: See Admitting Provider Note  Report given to:   Additional Notes:

## 2019-03-28 NOTE — Consult Note (Addendum)
NEUROLOGY CONSULT  Reason for Consult: neurologic opinion for PD and possible seizures Referring Physician: Dr Alvino Chapel  CC: "I just can't move and go out"  HPI: Jose Baldwin is an 85 y.o. male with PMH of HTN, prostate cancer, he has frequent fall's and recently had a vertebral fracture (recent vertebroplasty done on 02/01/19), he has had PD symptoms with shuffling gait and rigidity for 15+ years. However, he lacks a formal diagnosis due to lack of neurology services at the [place] where he previously got his care. He is accompanied by his son who is very informed and active in pts care. Pt has transferred to local [facility], but can't get in to see a specialist for nearly 26yr. His PCP did start him on Rytary while he awaited a Neurology consult as out pt, and he had excellent result and was able to walk again without walker, posture and speech improved as well as clear thinking. Unfortunately, this doctor is no longer there and no one is able to prescribe this medication again for him. Realizing this, his son thoughtfully did his best to wean off this med (rather than suddenly stopping it) with the remaining doses over the last month.   Today, his son brings him to the hospital for evaluation syncope spells (and orthostatic issues) that seemed to be increasing to 2+ times a day since stopping Rytary. He has had this type of spell in the past, but these seemed to have greatly improved while on Rytary. The pt's son described "seizure-like" activity with these spells. He shows [place] multiple videos of pt during these spells. Upon my review, it appears that pt had rigidity and freezing and lack of initiating his gait. The pt can state that he "just goes out", but can't remember the event when shown the video of himself. He does usually remain conscious during events, but becomes unable to speak. He has no memory deficits at baseline.   Past Medical History:  Diagnosis Date  . Hypertension   . Prostate cancer ([place])    . Syncope      Past Surgical History:  Procedure Laterality Date  . BACK SURGERY    . IR VERTEBROPLASTY CERV/THOR BX INC UNI/BIL INC/INJECT/IMAGING  01/31/2018  . IR VERTEBROPLASTY CERV/THOR BX INC UNI/BIL INC/INJECT/IMAGING  03/24/2019    Family History Family History  Problem Relation Age of Onset  . Heart attack Father 61    Social History    reports that he quit smoking about 30 years ago. His smoking use included cigarettes. He has a 60.00 pack-year smoking history. He has never used smokeless tobacco. He reports current alcohol use of about 14.0 standard drinks of alcohol per week. He reports that he does not use drugs.  Allergies No Known Allergies  Home Medications No current facility-administered medications on file prior to encounter.    Current Outpatient Medications on File Prior to Encounter  Medication Sig Dispense Refill  . Calcium Carbonate-Vitamin D (CALCIUM 600+D) 600-400 MG-UNIT tablet Take 1 tablet by mouth daily.    . Carbidopa-Levodopa ER (RYTARY) 23.75-95 MG CPCR Take 23.75 mg by mouth 3 (three) times daily.    . cloNIDine (CATAPRES - DOSED IN MG/24 HR) 0.2 mg/24hr patch Place 0.2 mg onto the skin once a week.    . cloNIDine (CATAPRES) 0.2 MG tablet Take 0.2 mg by mouth 4 (four) times daily as needed (every 6 hours for high BP).    Marland Kitchen diltiazem (CARDIZEM CD) 300 MG 24 hr capsule Take 300  mg by mouth at bedtime.    . feeding supplement, ENSURE ENLIVE, (ENSURE ENLIVE) LIQD Take 237 mLs by mouth 3 (three) times daily between meals. (Patient taking differently: Take 237 mLs by mouth daily. ) 237 mL 12  . fludrocortisone (FLORINEF) 0.1 MG tablet Take 2 tablets (0.2 mg total) by mouth at bedtime. 180 tablet 1  . potassium chloride SA (K-DUR) 20 MEQ tablet Take 20 mEq by mouth 2 (two) times daily.    . ranibizumab (LUCENTIS) 0.5 MG/0.05ML SOLN 0.5 mg by Intravitreal route once. Every 8 weeks    . thiamine (VITAMIN B-1) 100 MG tablet Take 100 mg by mouth daily.        ROS: Review of systems obtained and pertinent positives documented in the HPI, rest of the review negative.     Physical Examination:  Vitals:   03/28/19 1530 03/28/19 1545 03/28/19 1600 03/28/19 1615  BP: (!) 180/78 (!) 205/77 (!) 184/79 (!) 197/81  Pulse:  (!) 51  (!) 49  Resp: 12 15 14 14   Temp:      SpO2:  92%  98%  Weight:      Height:        General - frail, elderly man. No acute distress Heart - Regular rate and rhythm - no murmer Lungs - Clear to auscultation Abdomen - Soft - non tender Extremities - Distal pulses intact - no edema Skin - Warm and dry  Neurologic Examination:  Mental Status:  Alert, oriented, thought content appropriate. Speech is slow and hypophonic, but without evidence of dysarthria or aphasia. Able to follow 3 step commands without difficulty.  Hypomimia seen Cranial Nerves:  II-bilateral visual fields intact III/IV/VI-Pupils were equal and reacted. Extraocular movements were full.  V/VII-no facial numbness and no facial weakness.  As above, hypomimia VIII-hearing normal.  X-normal speech and symmetrical palatal movement.  XII-midline tongue extension  Motor: Right : Upper extremity   5/5    Left:     Upper extremity   5/5  Lower extremity   5/5     Lower extremity   5/5 Tone is mild-moderately rigid. bulk normal for age; no atrophy noted.  There is mild resting tremor on the right.. Movements are bradykinetic, and also slow to perform rapid alternating movements.  Mild cogwheeling. Sensory: Intact to light touch in all extremities. Deep Tendon Reflexes: 2/4 throughout Plantars: Downgoing bilaterally  Cerebellar: Normal finger to nose and heel to shin bilaterally. Gait: not tested. Posture in bed is stooped. Positive palmomental reflex and grasp reflex.  LABORATORY STUDIES:  Basic Metabolic Panel: Recent Labs  Lab 03/24/19 1017 03/28/19 1344  NA 140 138  K 4.6 4.0  CL 103 104  CO2 26 25  GLUCOSE 99 107*  BUN 21 29*   CREATININE 1.38* 1.58*  CALCIUM 10.0 9.2   CBC: Recent Labs  Lab 03/24/19 1017 03/28/19 1344  WBC 5.0 5.7  NEUTROABS 3.4  --   HGB 13.2 10.8*  HCT 41.4 34.0*  MCV 93.7 92.9  PLT 185 164    Urinalysis:  Recent Labs  Lab 03/24/19 1120 03/28/19 1510  COLORURINE STRAW* YELLOW  LABSPEC 1.008 1.014  PHURINE 8.0 6.0  GLUCOSEU NEGATIVE NEGATIVE  HGBUR NEGATIVE NEGATIVE  BILIRUBINUR NEGATIVE NEGATIVE  KETONESUR NEGATIVE NEGATIVE  PROTEINUR NEGATIVE NEGATIVE  NITRITE NEGATIVE NEGATIVE  LEUKOCYTESUR NEGATIVE NEGATIVE    Miscellaneous labs:  EKG  EKG  IMAGING: No results found.   Assessment/Plan: This is a 85yr old man who is being admitted by  hospitalitis service for syncope versus seizure work up. Neurology consulted to assist with PD treatment in setting of lack of access to neurology care as out pt.   # Parkinsons Disease- His disease process if fairly advanced, with 15+ years of symptoms. Although pt has not been formally dx by neurology as out pt (not by lack of trying, but lack of access to specialists), his PCP had essentially diagnosed this in years past& was prescribing him Rytary. This worked very well for him until this medication ran out and his doctor no longer was available at [place] to prescribe [facility]. [facility] pt neurology, but this can't be accomplished until the end of the year, nearly 61mo from time it was requested at the [place]. Meanwhile, the pt has greatly declined and has had increased falls and injury.  Also has gait initiation failure.  Plan: -Rytary is not formulary. Will start on Sinemet 100/25 q8h and monitor  #Syncopal convulsions and symptomatic bradycardia-more likely based on description and possibility of Parkinson's induced dysautonomia. However, must r/o seizures.   Plan: Will check routine EEG.  Management of bradycardia per cardiology and primary team.  #Other ddx: orthostatic hypotension, dehydration, POTS   Plan:-check orthostatic vitals.  # Freq falls, with injury-likely secondary to gait initiation failure due to Parkinson's.  Status post recent vertebroplasty 02/01/19 by Dr Estanislado Pandy. Stable at this time, no new injury noted.   I will also recommend ordering a brain MRI to ensure that there is no structural etiology for his symptoms.  Outpatient, he should be referred to a movement disorder specialist for follow-up  Thank you for this interesting consult.   Jose Baldwin, ARNP-C, ANVP-BC Pager: [phone]  Attending Neurohospitalist Addendum Patient seen and examined with APP/Resident. Agree with the history and physical as documented above. Agree with the plan as documented, which I helped formulate. I have independently reviewed the chart, obtained history, review of systems and examined the patient.I have personally reviewed pertinent head/neck/spine imaging (CT/MRI). Please feel free to call with any questions. --- Jose Portland, MD [facility] Pager: [phone]  If 7pm to 7am, please call on call as listed on AMION.

## 2019-03-28 NOTE — Progress Notes (Addendum)
Pt has arrived to unit. Fluids started and peripheral iv documented in flowsheets.  Pt in low bed with bed alarm on. Standing weight documented in flowsheets. CCMD called and verified. Nurse and nurse tech present.

## 2019-03-28 NOTE — ED Provider Notes (Signed)
[name] EMERGENCY DEPARTMENT Provider Note   CSN: [id number] Arrival date & time: 03/28/19  1241    History   Chief Complaint Chief Complaint  Patient presents with  . Seizures    HPI Jose Baldwin is a 85 y.o. male.     HPI Patient presents with questionable seizures.  Has had previous history of syncope that was thought to be orthostatic hypotension.  Patient's been having episodes where he stands up then will become unresponsive.  Will reportedly tensed up then shake.  Will be confused after the episodes.  Initially thought to be orthostatic hypotension have been treated as such but then followed up with the [facility] and states they started him back on a blood pressure medicine.  States that it was the episodes that was causing the blood pressure drop not the blood pressure dropping that was causing the episodes.  States there is been a neurology follow-up.  There was questionable history of Parkinson's.  Had been on medications and has been doing much better but is now off the medications.  Recent vertebroplasty.  Was seen at the [place] on Wednesday with today being Sunday after falling and hitting his head.  Reported negative head CT at that time.  However does have a bradycardia.  Heart rates in the 40s and down to 30s here.  Patient really cannot provide a whole lot of history and is normally alert and oriented normally.  Patient's son provides much of the history.  Patient son has a video of the events. Past Medical History:  Diagnosis Date  . Hypertension   . Prostate cancer ([place])   . Syncope     Patient Active Problem List   Diagnosis Date Noted  . HTN (hypertension) 02/04/2018  . Prostate cancer ([place]) 02/04/2018  . Orthostatic hypotension 02/04/2018  . Iron deficiency anemia 02/04/2018  . Syncope 02/02/2018    Past Surgical History:  Procedure Laterality Date  . BACK SURGERY    . IR VERTEBROPLASTY CERV/THOR BX INC UNI/BIL INC/INJECT/IMAGING  01/31/2018   . IR VERTEBROPLASTY CERV/THOR BX INC UNI/BIL INC/INJECT/IMAGING  03/24/2019        Home Medications    Prior to Admission medications   Medication Sig Start Date End Date Taking? Authorizing Provider  Calcium Carbonate-Vitamin D (CALCIUM 600+D) 600-400 MG-UNIT tablet Take 1 tablet by mouth daily.    [provider]  Carbidopa-Levodopa ER (RYTARY) 23.75-95 MG CPCR Take 23.75 mg by mouth 3 (three) times daily.    [provider]  cloNIDine (CATAPRES - DOSED IN MG/24 HR) 0.2 mg/24hr patch Place 0.2 mg onto the skin once a week.    [provider]  cloNIDine (CATAPRES) 0.2 MG tablet Take 0.2 mg by mouth 4 (four) times daily as needed (every 6 hours for high BP).    [provider]  diltiazem (CARDIZEM CD) 300 MG 24 hr capsule Take 300 mg by mouth at bedtime.    [provider]  feeding supplement, ENSURE ENLIVE, (ENSURE ENLIVE) LIQD Take 237 mLs by mouth 3 (three) times daily between meals. Patient taking differently: Take 237 mLs by mouth daily.  02/06/18   Lacroce, Hulen Shouts, MD  fludrocortisone (FLORINEF) 0.1 MG tablet Take 2 tablets (0.2 mg total) by mouth at bedtime. 12/29/18   Minus Breeding, MD  potassium chloride SA (K-DUR) 20 MEQ tablet Take 20 mEq by mouth 2 (two) times daily.    [provider]  ranibizumab (LUCENTIS) 0.5 MG/0.05ML SOLN 0.5 mg by Intravitreal  route once. Every 8 weeks    [provider]  thiamine (VITAMIN B-1) 100 MG tablet Take 100 mg by mouth daily.    [provider]    Family History Family History  Problem Relation Age of Onset  . Heart attack Father 13    Social History Social History   Tobacco Use  . Smoking status: Former Smoker    Packs/day: 2.00    Years: 30.00    Pack years: 60.00    Types: Cigarettes    Quit date: 1990    Years since quitting: 30.5  . Smokeless tobacco: Never Used  Substance Use Topics  . Alcohol use: Yes    Alcohol/week: 14.0 standard drinks     Types: 14 Cans of beer per week  . Drug use: Never     Allergies   Patient has no known allergies.   Review of Systems Review of Systems  Unable to perform ROS: Mental status change     Physical Exam Updated Vital Signs BP (!) 172/76   Pulse (!) 48   Temp 97.8 F (36.6 C)   Resp 12   Ht 6\' 4"  (1.93 m)   Wt 54.4 kg   SpO2 95%   BMI 14.61 kg/m   Physical Exam Vitals signs reviewed.  HENT:     Head: Normocephalic and atraumatic.  Eyes:     Pupils: Pupils are equal, round, and reactive to light.  Neck:     Musculoskeletal: Neck supple.  Cardiovascular:     Rate and Rhythm: Bradycardia present.  Pulmonary:     Breath sounds: Normal breath sounds.  Abdominal:     Tenderness: There is no abdominal tenderness.  Musculoskeletal:     Right lower leg: No edema.     Left lower leg: No edema.  Neurological:     Mental Status: He is alert.     Comments: Patient is awake and pleasant but seems somewhat confused.      ED Treatments / Results  Labs (all labs ordered are listed, but only abnormal results are displayed) Labs Reviewed  BASIC METABOLIC PANEL - Abnormal; Notable for the following components:      Result Value   Glucose, Bld 107 (*)    BUN 29 (*)    Creatinine, Ser 1.58 (*)    GFR calc non Af Amer 38 (*)    GFR calc Af Amer 45 (*)    All other components within normal limits  CBC - Abnormal; Notable for the following components:   RBC 3.66 (*)    Hemoglobin 10.8 (*)    HCT 34.0 (*)    RDW 15.8 (*)    All other components within normal limits  TSH  URINALYSIS, ROUTINE W REFLEX MICROSCOPIC    EKG EKG Interpretation  Date/Time:  Saturday March 28 2019 12:58:09 EDT Ventricular Rate:  46 PR Interval:    QRS Duration: 151 QT Interval:  482 QTC Calculation: 422 R Axis:   -64 Text Interpretation:  Slow sinus arrhythmia Prolonged PR interval Left bundle branch block Confirmed by Davonna Belling [phone]) on 03/28/2019 1:03:44 PM   Radiology No  results found.  Procedures Procedures (including critical care time)  Medications Ordered in ED Medications - No data to display   Initial Impression / Assessment and Plan / ED Course  I have reviewed the triage vital signs and the nursing notes.  Pertinent labs & imaging results that were available during my care of the patient were reviewed by  me and considered in my medical decision making (see chart for details).        Patient presents with episodes of decreased consciousness.  Viewed video of the events.  Appears to be happen mostly when he stands up which would go against something such as a seizure.  However the patient's son has taken the blood pressure when he is been standing upright before happens and states the blood pressure not dropped.  However with the bradycardia that will dip down into the 30s do worry if this could cause it somewhat.  Also had had questionable diagnosis of Parkinson's.  States he cannot get into see neurology through the [place] for another month or 2.  Patient would benefit from mission in the hospital for monitoring of the bradycardia and potentially neurology consult.  Will discuss with hospitalist.  Final Clinical Impressions(s) / ED Diagnoses   Final diagnoses:  Bradycardia  Loss of consciousness [facility])    ED Discharge Orders    None       Davonna Belling, MD 03/28/19 1449

## 2019-03-28 NOTE — ED Notes (Signed)
Pt. Aware urine is needed. Unable to provide any at this time.

## 2019-03-29 ENCOUNTER — Inpatient Hospital Stay (HOSPITAL_COMMUNITY): Payer: Medicare Other

## 2019-03-29 ENCOUNTER — Encounter (HOSPITAL_COMMUNITY): Payer: Self-pay

## 2019-03-29 DIAGNOSIS — I639 Cerebral infarction, unspecified: Secondary | ICD-10-CM

## 2019-03-29 DIAGNOSIS — R4020 Unspecified coma: Secondary | ICD-10-CM

## 2019-03-29 LAB — BASIC METABOLIC PANEL
Anion gap: 8 (ref 5–15)
BUN: 22 mg/dL (ref 8–23)
CO2: 28 mmol/L (ref 22–32)
Calcium: 9 mg/dL (ref 8.9–10.3)
Chloride: 104 mmol/L (ref 98–111)
Creatinine, Ser: 1.42 mg/dL — ABNORMAL HIGH (ref 0.61–1.24)
GFR calc Af Amer: 51 mL/min — ABNORMAL LOW (ref >60)
GFR calc non Af Amer: 44 mL/min — ABNORMAL LOW (ref >60)
Glucose, Bld: 94 mg/dL (ref 70–99)
Potassium: 3.5 mmol/L (ref 3.5–5.1)
Sodium: 140 mmol/L (ref 135–145)

## 2019-03-29 LAB — IRON AND TIBC
Iron: 43 ug/dL — ABNORMAL LOW (ref 45–182)
Saturation Ratios: 13 % — ABNORMAL LOW (ref 17.9–39.5)
TIBC: 323 ug/dL (ref 250–450)
UIBC: 280 ug/dL

## 2019-03-29 LAB — FOLATE: Folate: 20.7 ng/mL (ref >5.9)

## 2019-03-29 LAB — RETICULOCYTES
Immature Retic Fract: 3 % (ref 2.3–15.9)
RBC.: 3.67 MIL/uL — ABNORMAL LOW (ref 4.22–5.81)
Retic Count, Absolute: 37.1 10*3/uL (ref 19.0–186.0)
Retic Ct Pct: 1 % (ref 0.4–3.1)

## 2019-03-29 LAB — VITAMIN B12: Vitamin B-12: 814 pg/mL (ref 180–914)

## 2019-03-29 LAB — FERRITIN: Ferritin: 120 ng/mL (ref 24–336)

## 2019-03-29 MED ORDER — STROKE: EARLY STAGES OF RECOVERY BOOK
Freq: Once | Status: AC
  Administered 2019-03-30: 08:00:00
  Filled 2019-03-29 (×2): qty 1

## 2019-03-29 MED ORDER — IOHEXOL 350 MG/ML SOLN
75.0000 mL | Freq: Once | INTRAVENOUS | Status: AC | PRN
  Administered 2019-03-29: 100 mL via INTRAVENOUS

## 2019-03-29 MED ORDER — ASPIRIN 81 MG PO CHEW
81.0000 mg | CHEWABLE_TABLET | Freq: Every day | ORAL | Status: DC
  Administered 2019-03-29 – 2019-04-01 (×4): 81 mg via ORAL
  Filled 2019-03-29 (×4): qty 1

## 2019-03-29 NOTE — Progress Notes (Signed)
Pt has left the unit for MRI 

## 2019-03-29 NOTE — Progress Notes (Addendum)
Neurology progress note  Brief History 85yr old man admitted for syncope wk up. Neurology consulted for assistance in PD diagnosis/treatment and r/o seizures as component of convulsive syncope. As part of initial neurologic wk up acute and subacute strokes were found on MRI brain.   Subjective Pt remembered me from yesterday upon entering room.Jose Baldwin He was not as stooped in bed and no tremor noted. He says he is feeling much better today overall and reports no complaint. No syncope spells reported.   As part of neurologic work up, MRI brain was done.  Showed scattered right hemisphere acute and subacute strokes as well as some bilat small hemorrhages. He has no symptoms of these on exam.   Past Medical History Past Medical History:  Diagnosis Date  . Hypertension   . Prostate cancer ([place])   . Syncope     Past Surgical History Past Surgical History:  Procedure Laterality Date  . BACK SURGERY    . IR VERTEBROPLASTY CERV/THOR BX INC UNI/BIL INC/INJECT/IMAGING  01/31/2018  . IR VERTEBROPLASTY CERV/THOR BX INC UNI/BIL INC/INJECT/IMAGING  03/24/2019    Allergies No Known Allergies  Home Medications Medications Prior to Admission  Medication Sig Dispense Refill  . Calcium Carbonate-Vitamin D (CALCIUM 600+D) 600-400 MG-UNIT tablet Take 1 tablet by mouth at bedtime.     . cloNIDine (CATAPRES) 0.2 MG tablet Take 0.2 mg by mouth 4 (four) times daily.     Jose Baldwin diltiazem (CARDIZEM CD) 300 MG 24 hr capsule Take 300 mg by mouth at bedtime.    . feeding supplement, ENSURE ENLIVE, (ENSURE ENLIVE) LIQD Take 237 mLs by mouth 3 (three) times daily between meals. (Patient taking differently: Take 237 mLs by mouth See admin instructions. Drink one can (237 mls) by mouth once or twice daily) 237 mL 12  . potassium chloride SA (K-DUR) 20 MEQ tablet Take 20 mEq by mouth at bedtime.     . ranibizumab (LUCENTIS) 0.5 MG/0.05ML SOLN 0.5 mg by Intravitreal route every 8 (eight) weeks. For macular degeneration    .  thiamine (VITAMIN B-1) 100 MG tablet Take 100 mg by mouth at bedtime.     . Carbidopa-Levodopa ER (RYTARY) 23.75-95 MG CPCR Take 23.75 mg by mouth 3 (three) times daily.    . fludrocortisone (FLORINEF) 0.1 MG tablet Take 2 tablets (0.2 mg total) by mouth at bedtime. (Patient not taking: Reported on 03/28/2019) 180 tablet 1    Hospital Medications .  stroke: mapping our early stages of recovery book   Does not apply Once  . aspirin  81 mg Oral Daily  . carbidopa-levodopa  1 tablet Oral TID  . enoxaparin (LOVENOX) injection  30 mg Subcutaneous Q24H  . sodium chloride flush  3 mL Intravenous Q12H     Objective  Intake/Output from previous day: 07/18 0701 - 07/19 0700 In: 354.2 [I.V.:354.2] Out: 275 [Urine:275] Intake/Output this shift: Total I/O In: 240 [P.O.:240] Out: 201 [Urine:200; Stool:1] Nutritional status:  Diet Order            Diet regular Room service appropriate? Yes; Fluid consistency: Thin  Diet effective now             Physical Exam -  Vitals:   03/29/19 0443 03/29/19 0835 03/29/19 1100 03/29/19 1154  BP: (!) 154/62 (!) 164/79  (!) 160/78  Pulse: 66 77  (!) 55  Resp: 18 20  18   Temp: 98.1 F (36.7 C) 98.2 F (36.8 C)  97.9 F (36.6 C)  TempSrc: Oral Oral  Oral  SpO2: 100% 97% 98% 95%  Weight:      Height:       General - frail, elderly, no distress at this time Heart - Regular rate and rhythm - no murmer Lungs - Clear to auscultation Abdomen - Soft - non tender Extremities - Distal pulses intact - no edema Skin - Warm and dry Psych- flat affect  Neurologic Exam:   Mental Status:  Alert, oriented, thought content appropriate. His memory is good, as he remembered who I was upon entering before even introducing myself. Speech is slow and hypophonic, but without evidence of dysarthria or aphasia. Able to follow 3 step commands without difficulty.  Cranial Nerves:  II-bilateral visual fields intact III/IV/VI-Pupils were equal and reacted.  Extraocular movements were full.  V/VII-no facial numbness and no facial weakness. hypermimia noted VIII-hearing normal.  X-normal speech and symmetrical palatal movement.  XII-midline tongue extension  Motor: Right : Upper extremity   5/5    Left:     Upper extremity   5/5  Lower extremity   5/5     Lower extremity   5/5 Tone is mild-moderately rigid. bulk normal for age; no atrophy noted.  There is mild resting tremor on the right.. Movements are bradykinetic, and also slow to perform rapid alternating movements. Did not see the mild cogwheeling today on exam Sensory: Intact to light touch in all extremities. Deep Tendon Reflexes: 2/4 throughout Plantars: Downgoing bilaterally  Cerebellar: Normal finger to nose and heel to shin bilaterally. Gait: not tested for safety. Sitting posture improved today, not stooped and able to lift torso upright more readily.   NIH stroke scale 1a Level of Conscious.: 0 1b LOC Questions:0  1c LOC Commands: 0 2 Best Gaze: 0 3 Visual: 0 4 Facial Palsy: 0 5a Motor Arm - left: 0 5b Motor Arm - Right: 0 6a Motor Leg - Left: 0 6b Motor Leg - Right: 0 7 Limb Ataxia: 0 8 Sensory: 0 9 Best Language: 0 10 Dysarthria: 0 11 Extinct. and Inatten.: 0 TOTAL: 0   LABORATORY RESULTS:  Basic Metabolic Panel: Recent Labs  Lab 03/24/19 1017 03/28/19 1344 03/29/19 0332  NA 140 138 140  K 4.6 4.0 3.5  CL 103 104 104  CO2 26 25 28   GLUCOSE 99 107* 94  BUN 21 29* 22  CREATININE 1.38* 1.58* 1.42*  CALCIUM 10.0 9.2 9.0    Liver Function Tests: No results for input(s): AST, ALT, ALKPHOS, BILITOT, PROT, ALBUMIN in the last 168 hours. No results for input(s): LIPASE, AMYLASE in the last 168 hours. No results for input(s): AMMONIA in the last 168 hours.  CBC: Recent Labs  Lab 03/24/19 1017 03/28/19 1344  WBC 5.0 5.7  NEUTROABS 3.4  --   HGB 13.2 10.8*  HCT 41.4 34.0*  MCV 93.7 92.9  PLT 185 164    IMAGING RESULTS Mr Brain Wo Contrast  Result  Date: 03/29/2019 CLINICAL DATA:  Initial evaluation for possible syncope versus seizure, presumed history of Parkinson's disease. EXAM: MRI HEAD WITHOUT CONTRAST TECHNIQUE: Multiplanar, multiecho pulse sequences of the brain and surrounding structures were obtained without intravenous contrast. COMPARISON:  None available. FINDINGS: Brain: Diffuse prominence of the CSF containing spaces compatible with generalized age-related cerebral atrophy. Patchy and confluent T2/FLAIR hyperintensity within the periventricular and deep white matter both cerebral hemispheres most consistent with chronic microvascular ischemic disease, moderate in nature. Scattered superimposed remote lacunar infarcts present within the bilateral basal ganglia. Associated chronic hemosiderin staining about several  of these infarcts. 13 mm focus of restricted diffusion seen involving the right lentiform nucleus/genu of the internal capsule (series 5, image 72). Additional patchy involvement of the adjacent right caudate, external capsule, and subinsular white matter (series 5, image 70). Probable mild petechial hemorrhage at the level of the internal capsule (series 14, image 32). No significant mass effect. 13 mm focus of vague diffusion abnormality involving the right posterior periatrial white matter without ADC correlate likely reflects the sequelae of subacute small vessel ischemia (series 5, image 72). Additional similar focus seen involving the high subcortical right frontal lobe (series 5, image 83). No other evidence for acute or subacute ischemia. Gray-white matter differentiation otherwise maintained. No other areas of remote cortical infarction. No other evidence for acute or chronic intracranial hemorrhage. No mass lesion, midline shift or mass effect. Mild diffuse ventricular prominence related to global parenchymal volume loss without hydrocephalus. No extra-axial fluid collection. Pituitary gland suprasellar region normal. Midline  structures intact and normal. No intrinsic temporal lobe abnormality. Evaluation of the pars compacta limited on this non high-resolution exam. Vascular: Major intracranial vascular flow voids are maintained. Skull and upper cervical spine: Craniocervical junction within normal limits. Exaggeration of the normal cervical lordosis noted. Bone marrow signal intensity within normal limits. 14 mm well-circumscribed T2 hyperintense cystic lesion within the subcutaneous fat of the suboccipital scalp noted, likely a sebaceous cyst. Scalp soft tissues demonstrate no acute finding. Sinuses/Orbits: Patient status post bilateral ocular lens replacement. Globes and orbital soft tissues demonstrate no acute finding. Paranasal sinuses are largely clear. Trace opacity bilateral mastoid air cells, of doubtful significance. Inner ear structures grossly normal. Other: None. IMPRESSION: 1. Patchy small volume acute ischemic infarcts involving the right basal ganglia, with extension into the right external capsule/sub insular white matter as above. Associated mild petechial hemorrhage without hemorrhagic transformation or significant mass effect. 2. Additional patchy small volume late subacute small vessel ischemic changes involving the right periatrial and right frontal subcortical white matter. 3. Underlying age-related cerebral atrophy with moderate chronic microvascular ischemic disease, with multiple remote lacunar infarcts involving the basal ganglia bilaterally. Electronically Signed   By: Jeannine Boga M.D.   On: 03/29/2019 04:08     EEG: pending  Transthoracic Echocardiogram: pending  Assessment/Plan:This is a 85yr old man who is being admitted by hospitalitis service for syncope versus seizure work up. Neurology consulted to assist with PD treatment in setting of lack of access to neurology care as out pt. As part of initial neurologic wk up acute and subacute strokes were found incidentally on MRI brain.   #  Acute scattered stroke- there are also some subacute looking strokes as well as old strokes seen on MRI. Due to the multiple vascular areas of distrubution, suspect cardieoemboic etiology. Wk up underway: Echo, CTA h/n, lipids/A1C  # Parkinsons Disease- His disease process if fairly advanced, with 15+ years of symptoms. Although pt has not been formally dx by neurology as out pt (not by lack of trying, but lack of access to specialists), his PCP had essentially diagnosed this in years past& was prescribing him Rytary. This worked very well for him until this medication ran out and his doctor no longer was available at [place] to prescribe this. [facility] neurology, but this can't be accomplished until the end of the year, nearly 60mo from time it was requested at the [place]. Meanwhile, the pt has greatly declined and has had increased falls and injury.  Also  has gait initiation failure.  Plan: -Rytary is not formulary. Will start on Sinemet 100/25 q8h and monitor  #Syncopal convulsions and symptomatic bradycardia-more likely based on description and possibility of Parkinson's induced dysautonomia. However, must r/o seizures.   Plan: routine EEG completed; awaiting results.  Management of bradycardia per cardiology and primary team.  #Other ddx: orthostatic hypotension, dehydration, POTS  Plan:-check orthostatic vitals.  # Freq falls, with injury-likely secondary to gait initiation failure due to Parkinson's.  Status post recent vertebroplasty 02/01/19 by Dr Estanislado Pandy. Stable at this time, no new injury noted.    PLAN: F/u on EEG results No ASD at this time Stroke team consult 81mg  ASA daily  Ck lipids, Aic Echo CTA h/n if Crt ok Con't IVF and follow BMP NIHSS; neuro checks q4 Stroke wk up as above; if no etiology found he may need long term cardiac monitoring such as 30d halter vs Loop recorder to further evaluate for Afib If Afib is found, a complex discussion for  [employer] w/pt and family will need to be done; he is at high risk for hemorrhage and injury given his freq falls d/t PD.   Outpatient Recommendations: Follow-up outpatient neurology-preferably movement disorders specialist for his Parkinson's. Stroke follow-up per stroke team.  Stroke team will follow with you.  Desiree Metzger-Cihelka, ARNP-C, ANVP-BC Pager: [phone]  Attending Neurohospitalist Addendum Patient seen and examined with APP/Resident. Agree with the history and physical as documented above. Agree with the plan as documented, which I helped formulate. I have independently reviewed the chart, obtained history, review of systems and examined the patient.I have personally reviewed pertinent head/neck/spine imaging (CT/MRI). Please feel free to call with any questions. --- Amie Portland, MD [facility] Pager: [phone]  If 7pm to 7am, please call on call as listed on [facility].

## 2019-03-29 NOTE — Progress Notes (Signed)
Patient pass bed side swallow eval by RN, spoke with neuro NP okay to order previous diet.  Per NP [employer]HS should be done every 4 hours x12 hrs then every shift.  Patient is stable, will continue to monitor.

## 2019-03-29 NOTE — Progress Notes (Signed)
EEG complete - results pending 

## 2019-03-29 NOTE — Procedures (Signed)
History: 85 year old male being evaluated for multiple episodes of syncope.  Sedation: None  Technique: This is a 21 channel routine scalp EEG performed at the bedside with bipolar and monopolar montages arranged in accordance to the international 10/20 system of electrode placement. One channel was dedicated to EKG recording.    Background: There is a posterior dominant rhythm of 9 Hz which is seen at times.  In addition, there is generalized irregular delta and theta range activity admixed into the background. Sleep is not recorded.  Photic stimulation: Physiologic driving is not performed  EEG Abnormalities: 1) generalized irregular slow activity.   Clinical Interpretation: This EEG is consistent with a generalized non-specific cerebral dysfunction(encephalopathy). There was no seizure or seizure predisposition recorded on this study. Please note that lack of epileptiform activity on EEG does not preclude the possibility of epilepsy.   Roland Rack, MD [facility] [phone]  If 7pm- 7am, please page neurology on call as listed in [place].

## 2019-03-29 NOTE — Progress Notes (Addendum)
PROGRESS NOTE  Jose Baldwin:[id number] DOB: [birth year] DOA: 03/28/2019 PCP: Billie Ruddy, MD  Brief History    85 year old Caucasian male-prior syncope lightheadedness since 09/2017, chronic LBBB, known carotid bruit, prostate cancer in the past-apparently 15 years of shuffling gait rigidity without formal diagnosis Parkinson's-[facility] Rytary to see if he would benefit Patient did great with this however he ran out of trial meds within the last week Son brought him to the hospital 7/18 multiple syncopal spells with possible seizure-like activity which seem to be consistent with his prior admission last year Neurology consulted recommended EEG and also recommended titration to generic Sinemet  A & P  Seizure disorder DDX more likely dysautonomia from Parkinson's Await EEG, await further input from neurology Continue Sinemet-PT OT eval regarding safety-prior to admission when he was on Sinemet last week he was walking even without the walker! ? STROKE? MRI surprisingly confirms subacute and may be acute stroke-really appreciate neurology taking bleed and ordering work-up including echo carotids etc. Has bled score, [place] score need to be balanced carefully given his orthostasis and concerns for falls I will discuss this with family once work-up is further completed and reach out to them later today Volume depletion, component of orthostasis Orthostatics not performed in the emergency room-attempt again today, start TED hose and may need to resume Florinef-blood pressures are extremely high however in this population would allow for significant supine hypertension as patient will become orthostatic-risk benefits need to be weighed-would defer to PCP and neurology to talk about ongoing management Cardizem 360 on admission will only use clonidine as needed pressure >180 Creatinine is slowly improving Possible tachybradycardia syndrome-possible A.  fib On monitors today he has multiformed PVCs 4-5 beats of NSVT and possibility of A. fib cannot be excluded you we will keep on monitors and monitor manage accordingly Iron deficiency anemia/mild thrombocytopenia -Await lab work Hemoglobin within normal range 10-12 as per prior Recent kyphoplasty T12-outpatient management  severe malnutrition, Body mass index is 14.8 kg/m.  Probable osteoporosis and osteopenia Independent risk factor for mortality-family needs to discuss this with outpatient physicians Prior prostate cancer No records-outpatient PSA testing at [facility]    DVT prophylaxis: Lovenox Code Status: Full Family Communication: Called son and updated completely Disposition Plan: Inpatient pending therapy evaluation pending EEG and pending improvement expect can discharge in 48 to 72 hours if stabilizes ,  Verneita Griffes, MD [facility] 8:24 AM  03/29/2019, 8:24 AM  LOS: 1 day   Consultants  . Neurology  Procedures  . EEG is pending  Antibiotics  . None at this time  Interval History/Subjective  Feels better slight headache has not been up out of bed Orthostatics to be done later   Objective   Vitals:  Vitals:   03/29/19 0059 03/29/19 0443  BP: (!) 176/76 (!) 154/62  Pulse: 60 66  Resp: 18 18  Temp: (!) 97.5 F (36.4 C) 98.1 F (36.7 C)  SpO2: 97% 100%    Exam:  Awake coherent no distress dry mucosa significant wasting Cytopenic Extraocular movements intact Finger-nose-finger is slightly altered on right side and on the left however he corrects quickly Chest is clear without added sound no rales he does have 2 bandages on his back S1-S2 rhythm as above Abdomen is soft nontender Able to raise both hands above head able to raise both feet above bed and    able to sit himself up   I have personally reviewed  the following:   Today's Data  . As above  Lab Data  . BUN/creatinine down from admission 29/1 0.5-20 2/1.4 baseline creatinine is less  than 1.0 . Other labs wnl  Micro Data  . None  Imaging  . Await EEG  Cardiology Data  . As above he has PVCs and NSVT with question of A. fib so keeping on monitors  Other Data  . None  Scheduled Meds: . carbidopa-levodopa  1 tablet Oral TID  . enoxaparin (LOVENOX) injection  30 mg Subcutaneous Q24H  . sodium chloride flush  3 mL Intravenous Q12H   Continuous Infusions: . sodium chloride 50 mL/hr at 03/29/19 0230    Active Problems:   Syncope   HTN (hypertension)   Prostate cancer (HCC)   Iron deficiency anemia   POTS (postural orthostatic tachycardia syndrome)   LOS: 1 day   How to contact the [facility] Attending or Consulting provider 7A - 7P or covering provider during after hours [place], for this patient?  1. Check the care team in [facility] and look for a) attending/consulting TR[facility] provider listed and b) the [facility] team listed 2. Log into [email or web address] and use [place]'s universal password to access. If you do not have the password, please contact the hospital operator. 3. Locate the [facility] provider you are looking for under [facility] and page to a number that you can be directly reached. 4. If you still have difficulty reaching the provider, please page the [facility] (Director on Call) for the Hospitalists listed on amion for assistance.

## 2019-03-29 NOTE — Progress Notes (Signed)
Spoke with son and updated the plan  Amie Portland, MD

## 2019-03-29 NOTE — Evaluation (Signed)
Occupational Therapy Evaluation Patient Details Name: Jose Baldwin MRN: [id number] DOB: 1928/11/20 Today's Date: 03/29/2019    History of Present Illness 85 year old Caucasian male-prior syncope lightheadedness since 09/2017, chronic LBBB, known carotid bruit, prostate cancer in the past-apparently 15 years of shuffling gait rigidity without formal diagnosis Parkinson's-[facility] Rytary to see if he would benefit. He did great but is no longer able to get medication. Admitted for syncope of unknown origin 17/18/20 Neurology is recommending titration to Sinemet.    Clinical Impression   Pt admitted with syncope. Pt currently with functional limitations due to the deficits listed below (see OT Problem List).  Pt will benefit from skilled OT to increase their safety and independence with ADL and functional mobility for ADL to facilitate discharge to venue listed below.   Pt has limited care at home and would need increased A in order to safely DC home     Follow Up Recommendations  SNF;Home health OT;Supervision/Assistance - 24 hour    Equipment Recommendations  None recommended by OT    Recommendations for Other Services       Precautions / Restrictions Precautions Precautions: Fall Precaution Comments: recent kyphoplasty. spinal  Restrictions Weight Bearing Restrictions: No      Mobility Bed Mobility Overal bed mobility: Needs Assistance Bed Mobility: Supine to Sit;Sit to Supine     Supine to sit: Min assist;+2 for physical assistance Sit to supine: Min assist;+2 for physical assistance   General bed mobility comments: educated pt in need for spinal alignment s/p spinal surgery, recommended rolling to side, pt instead with long sitting and pivot on bottom, assist for hips to EoB, with return to bed pt again resisted sidelying and prefers pivot on bottom assist needed for management of LE into bed  Transfers Overall transfer level:  Needs assistance Equipment used: Rolling walker (2 wheeled) Transfers: Sit to/from Stand Sit to Stand: Min assist;+2 safety/equipment         General transfer comment: minA for power up and steadying with RW,     Balance Overall balance assessment: Needs assistance Sitting-balance support: No upper extremity supported;Feet supported Sitting balance-Leahy Scale: Fair     Standing balance support: Bilateral upper extremity supported Standing balance-Leahy Scale: Poor Standing balance comment: requires UE support on RW                           ADL either performed or assessed with clinical judgement   ADL Overall ADL's : Needs assistance/impaired Eating/Feeding: Set up;Sitting   Grooming: Minimal assistance;Standing   Upper Body Bathing: Minimal assistance;Sitting   Lower Body Bathing: Moderate assistance;Sit to/from stand;Cueing for safety;Cueing for compensatory techniques   Upper Body Dressing : Minimal assistance;Sitting   Lower Body Dressing: Moderate assistance;Sit to/from stand;Cueing for compensatory techniques;Cueing for safety                 General ADL Comments: Pt would need A for all ADL activity at home at this time.  would need increased care at home in order to DC home                  Pertinent Vitals/Pain Pain Assessment: Faces Faces Pain Scale: Hurts little more Pain Location: headache, and backache  Pain Descriptors / Indicators: Headache;Sore Pain Intervention(s): Limited activity within patient's tolerance;Monitored during session     Hand Dominance     Extremity/Trunk Assessment Upper Extremity Assessment Upper Extremity Assessment:  Generalized weakness   Lower Extremity Assessment Lower Extremity Assessment: RLE deficits/detail;LLE deficits/detail RLE Deficits / Details: decreased knee and hip extension, strength grossly assessed in ambulation 3+/5  LLE Deficits / Details: decreased knee and hip extension, strength  grossly assessed in ambulation 3+/5        Communication Communication Communication: No difficulties(decreased phonation )   Cognition Arousal/Alertness: Awake/alert Behavior During Therapy: WFL for tasks assessed/performed Overall Cognitive Status: Within Functional Limits for tasks assessed                                     General Comments  Pt with redness on spinal process, RN notified foam placed             Home Living Family/patient expects to be discharged to:: Private residence Living Arrangements: Alone Available Help at Discharge: Family;Available PRN/intermittently Type of Home: Mobile home Home Access: Ramped entrance     Home Layout: One level     Bathroom Shower/Tub: Walk-in shower         Home Equipment: Shower seat          Prior Functioning/Environment Level of Independence: Needs assistance  Gait / Transfers Assistance Needed: uses RW most of the time ADL's / Homemaking Assistance Needed: aide 3 hrs a day in morning, son assists at night, independent with showering sometimes needs assist, aide for shopping cooking and cleaning             OT Problem List: Decreased strength;Decreased activity tolerance;Impaired balance (sitting and/or standing);Decreased safety awareness      OT Treatment/Interventions: Self-care/ADL training;Patient/family education;DME and/or AE instruction    OT Goals(Current goals can be found in the care plan section) Acute Rehab OT Goals Patient Stated Goal: go home OT Goal Formulation: With patient Time For Goal Achievement: 04/12/19 Potential to Achieve Goals: Good  OT Frequency: Min 2X/week   Barriers to D/C: Decreased caregiver support             AM-PAC OT "6 Clicks" Daily Activity     Outcome Measure Help from another person eating meals?: None Help from another person taking care of personal grooming?: A Little Help from another person toileting, which includes using toliet,  bedpan, or urinal?: A Lot Help from another person bathing (including washing, rinsing, drying)?: A Lot Help from another person to put on and taking off regular upper body clothing?: A Little Help from another person to put on and taking off regular lower body clothing?: A Lot 6 Click Score: 16   End of Session Equipment Utilized During Treatment: Gait belt;Rolling walker Nurse Communication: Mobility status  Activity Tolerance: Patient tolerated treatment well Patient left: in bed;with call bell/phone within reach;with bed alarm set  OT Visit Diagnosis: Unsteadiness on feet (R26.81);Repeated falls (R29.6);Muscle weakness (generalized) (M62.81);History of falling (Z91.81)                Time: 1107-1130 OT Time Calculation (min): 23 min Charges:   1 visit 1 mod eval  Kari Baars, OT Acute Rehabilitation Services [phone] Office- [phone], Edwena Felty D 03/29/2019, 12:24 PM

## 2019-03-29 NOTE — Progress Notes (Signed)
RN called Rapid Response nurse to bedside to do the [facility] as ordered.

## 2019-03-29 NOTE — Plan of Care (Signed)
A/O x3.  Pt denies any pain or discomfort. Monitoring patient closely for stroke symptoms. No changes at this moment

## 2019-03-29 NOTE — Evaluation (Addendum)
Physical Therapy Evaluation Patient Details Name: Jose Baldwin MRN: [id number] DOB: 11-Dec-1928 Today's Date: 03/29/2019   History of Present Illness  85 year old Caucasian male-prior syncope lightheadedness since 09/2017, chronic LBBB, known carotid bruit, prostate cancer in the past-apparently 15 years of shuffling gait rigidity without formal diagnosis Parkinson's-[facility] Rytary to see if he would benefit. He did great but is no longer able to get medication. Admitted for syncope of unknown origin 17/18/20 Neurology is recommending titration to Sinemet.   Clinical Impression  PTA pt living alone with HHA 3 hrs/day 7day/wk in morning for meal prep, cleaning, shopping and occasional bathing and dressing and son assistance in evening. Pt is limited in safe mobility by syncopal episodes (possibly orthostatic see table below), possible Parkinson's as well as decreased ROM, and strength. Pt requires minA for bed mobility, transfers and ambulation of 250 ft with RW. PT recommending either SNF placement or 24 hour in home care for safety with addition of HHPT. PT will continue to follow acutely.    Orthostatic BPs  Supine 178/42  Sitting 175/78  Standing 136/53  After ambulation  180/88       Follow Up Recommendations SNF;Supervision/Assistance - 24 hour    Equipment Recommendations  None recommended by PT       Precautions / Restrictions Precautions Precautions: Fall Precaution Comments: recent kyphoplasty. spinal  Restrictions Weight Bearing Restrictions: No      Mobility  Bed Mobility Overal bed mobility: Needs Assistance Bed Mobility: Supine to Sit;Sit to Supine     Supine to sit: Min assist;+2 for physical assistance Sit to supine: Min assist;+2 for physical assistance   General bed mobility comments: educated pt in need for spinal alignment s/p spinal surgery, recommended rolling to side, pt instead with long sitting and pivot on  bottom, assist for hips to EoB, with return to bed pt again resisted sidelying and prefers pivot on bottom assist needed for management of LE into bed  Transfers Overall transfer level: Needs assistance Equipment used: Rolling walker (2 wheeled) Transfers: Sit to/from Stand Sit to Stand: Min assist;+2 safety/equipment         General transfer comment: minA for power up and steadying with RW,   Ambulation/Gait Ambulation/Gait assistance: Min assist;+2 safety/equipment Gait Distance (Feet): 250 Feet Assistive device: Rolling walker (2 wheeled) Gait Pattern/deviations: Step-through pattern;Decreased step length - right;Decreased step length - left;Festinating;Shuffle;Trunk flexed Gait velocity: slowed Gait velocity interpretation: 1.31 - 2.62 ft/sec, indicative of limited community ambulator General Gait Details: minA for steadying with gait, once in hallway pt able to increase stride length and gait speed but with turning around to return to room pt gait became more fesitnating, difficult to determine if related to turning or to fatigue       Balance Overall balance assessment: Needs assistance Sitting-balance support: No upper extremity supported;Feet supported Sitting balance-Leahy Scale: Fair     Standing balance support: Bilateral upper extremity supported Standing balance-Leahy Scale: Poor Standing balance comment: requires UE support on RW                             Pertinent Vitals/Pain Pain Assessment: Faces Faces Pain Scale: Hurts little more Pain Location: headache, and backache (headache did not change with position or exertion) Pain Descriptors / Indicators: Headache;Sore Pain Intervention(s): Limited activity within patient's tolerance;Monitored during session;Repositioned    Home Living Family/patient expects to be discharged to:: Private residence Living  Arrangements: Alone   Type of Home: Mobile home Home Access: Ramped entrance     Home  Layout: One level Home Equipment: Shower seat      Prior Function Level of Independence: Needs assistance   Gait / Transfers Assistance Needed: uses RW most of the time  ADL's / Homemaking Assistance Needed: aide 3 hrs a day in morning, son assists at night, independent with showering sometimes needs assist, aide for shopping cooking and cleaning            Extremity/Trunk Assessment   Upper Extremity Assessment Upper Extremity Assessment: Defer to OT evaluation    Lower Extremity Assessment Lower Extremity Assessment: RLE deficits/detail;LLE deficits/detail RLE Deficits / Details: decreased knee and hip extension, strength grossly assessed in ambulation 3+/5  LLE Deficits / Details: decreased knee and hip extension, strength grossly assessed in ambulation 3+/5        Communication   Communication: (decreased phonation )  Cognition Arousal/Alertness: Awake/alert Behavior During Therapy: WFL for tasks assessed/performed Overall Cognitive Status: Within Functional Limits for tasks assessed                                        General Comments General comments (skin integrity, edema, etc.): Pt with redness on spinal process, RN notified foam placed         Assessment/Plan    PT Assessment Patient needs continued PT services  PT Problem List Decreased strength;Decreased range of motion;Decreased activity tolerance;Decreased balance;Decreased mobility;Decreased coordination;Decreased safety awareness;Pain       PT Treatment Interventions DME instruction;Gait training;Functional mobility training;Therapeutic activities;Therapeutic exercise;Balance training;Cognitive remediation;Patient/family education    PT Goals (Current goals can be found in the Care Plan section)  Acute Rehab PT Goals Patient Stated Goal: go home PT Goal Formulation: With patient Time For Goal Achievement: 04/12/19 Potential to Achieve Goals: Fair    Frequency Min 3X/week     AM-PAC PT "6 Clicks" Mobility  Outcome Measure Help needed turning from your back to your side while in a flat bed without using bedrails?: A Little Help needed moving from lying on your back to sitting on the side of a flat bed without using bedrails?: A Little Help needed moving to and from a bed to a chair (including a wheelchair)?: A Little Help needed standing up from a chair using your arms (e.g., wheelchair or bedside chair)?: A Little Help needed to walk in hospital room?: A Little Help needed climbing 3-5 steps with a railing? : A Lot 6 Click Score: 17    End of Session Equipment Utilized During Treatment: Gait belt Activity Tolerance: Patient tolerated treatment well Patient left: in bed;with call bell/phone within reach;with bed alarm set Nurse Communication: Mobility status PT Visit Diagnosis: Unsteadiness on feet (R26.81);Other abnormalities of gait and mobility (R26.89);Muscle weakness (generalized) (M62.81);Difficulty in walking, not elsewhere classified (R26.2);Other symptoms and signs involving the nervous system (R29.898);Pain Pain - part of body: (headache )    Time: 1610-9604 PT Time Calculation (min) (ACUTE ONLY): 29 min   Charges:   PT Evaluation $PT Eval Moderate Complexity: 1 Mod          Raynell Scott B. Migdalia Dk PT, DPT Acute Rehabilitation Services Pager [phone] Office [phone]   [place] 03/29/2019, 12:12 PM

## 2019-03-30 ENCOUNTER — Inpatient Hospital Stay (HOSPITAL_COMMUNITY): Payer: Medicare Other

## 2019-03-30 DIAGNOSIS — R000 Tachycardia, unspecified: Secondary | ICD-10-CM

## 2019-03-30 DIAGNOSIS — I6340 Cerebral infarction due to embolism of unspecified cerebral artery: Secondary | ICD-10-CM

## 2019-03-30 DIAGNOSIS — I951 Orthostatic hypotension: Secondary | ICD-10-CM

## 2019-03-30 LAB — COMPREHENSIVE METABOLIC PANEL
ALT: 5 U/L (ref 0–44)
AST: 16 U/L (ref 15–41)
Albumin: 3.2 g/dL — ABNORMAL LOW (ref 3.5–5.0)
Alkaline Phosphatase: 68 U/L (ref 38–126)
Anion gap: 6 (ref 5–15)
BUN: 21 mg/dL (ref 8–23)
CO2: 26 mmol/L (ref 22–32)
Calcium: 8.8 mg/dL — ABNORMAL LOW (ref 8.9–10.3)
Chloride: 107 mmol/L (ref 98–111)
Creatinine, Ser: 1.19 mg/dL (ref 0.61–1.24)
GFR calc Af Amer: >60 mL/min (ref >60)
GFR calc non Af Amer: 54 mL/min — ABNORMAL LOW (ref >60)
Glucose, Bld: 86 mg/dL (ref 70–99)
Potassium: 3.8 mmol/L (ref 3.5–5.1)
Sodium: 139 mmol/L (ref 135–145)
Total Bilirubin: 0.5 mg/dL (ref 0.3–1.2)
Total Protein: 5.5 g/dL — ABNORMAL LOW (ref 6.5–8.1)

## 2019-03-30 LAB — CBC WITH DIFFERENTIAL/PLATELET
Abs Immature Granulocytes: 0.01 10*3/uL (ref 0.00–0.07)
Basophils Absolute: 0 10*3/uL (ref 0.0–0.1)
Basophils Relative: 1 %
Eosinophils Absolute: 0.1 10*3/uL (ref 0.0–0.5)
Eosinophils Relative: 2 %
HCT: 34 % — ABNORMAL LOW (ref 39.0–52.0)
Hemoglobin: 11.1 g/dL — ABNORMAL LOW (ref 13.0–17.0)
Immature Granulocytes: 0 %
Lymphocytes Relative: 22 %
Lymphs Abs: 1.3 10*3/uL (ref 0.7–4.0)
MCH: 30 pg (ref 26.0–34.0)
MCHC: 32.6 g/dL (ref 30.0–36.0)
MCV: 91.9 fL (ref 80.0–100.0)
Monocytes Absolute: 0.6 10*3/uL (ref 0.1–1.0)
Monocytes Relative: 10 %
Neutro Abs: 3.7 10*3/uL (ref 1.7–7.7)
Neutrophils Relative %: 65 %
Platelets: 158 10*3/uL (ref 150–400)
RBC: 3.7 MIL/uL — ABNORMAL LOW (ref 4.22–5.81)
RDW: 15.4 % (ref 11.5–15.5)
WBC: 5.8 10*3/uL (ref 4.0–10.5)
nRBC: 0 % (ref 0.0–0.2)

## 2019-03-30 LAB — LIPID PANEL
Cholesterol: 207 mg/dL — ABNORMAL HIGH (ref 0–200)
HDL: 62 mg/dL (ref >40)
LDL Cholesterol: 131 mg/dL — ABNORMAL HIGH (ref 0–99)
Total CHOL/HDL Ratio: 3.3 RATIO
Triglycerides: 70 mg/dL (ref <150)
VLDL: 14 mg/dL (ref 0–40)

## 2019-03-30 LAB — ECHOCARDIOGRAM COMPLETE
Height: 76 in
Weight: 1884.8 oz

## 2019-03-30 LAB — TSH: TSH: 1.747 u[IU]/mL (ref 0.350–4.500)

## 2019-03-30 LAB — HEMOGLOBIN A1C
Hgb A1c MFr Bld: 5.4 % (ref 4.8–5.6)
Mean Plasma Glucose: 108.28 mg/dL

## 2019-03-30 MED ORDER — ATORVASTATIN CALCIUM 40 MG PO TABS: 40.0000 mg | ORAL_TABLET | Freq: Every day | ORAL | Status: DC

## 2019-03-30 MED ORDER — ADULT MULTIVITAMIN W/MINERALS CH
1.0000 | ORAL_TABLET | Freq: Every day | ORAL | Status: DC
  Administered 2019-03-31 – 2019-04-01 (×2): 1 via ORAL
  Filled 2019-03-30 (×2): qty 1

## 2019-03-30 MED ORDER — CLOPIDOGREL BISULFATE 75 MG PO TABS
75.0000 mg | ORAL_TABLET | Freq: Every day | ORAL | Status: DC
  Administered 2019-03-31 – 2019-04-01 (×2): 75 mg via ORAL
  Filled 2019-03-30 (×2): qty 1

## 2019-03-30 MED ORDER — HYDRALAZINE HCL 20 MG/ML IJ SOLN: 5.0000 mg | Freq: Four times a day (QID) | INTRAMUSCULAR | Status: DC | PRN

## 2019-03-30 MED ORDER — ROSUVASTATIN CALCIUM 20 MG PO TABS
40.0000 mg | ORAL_TABLET | Freq: Every day | ORAL | Status: DC
  Administered 2019-03-31: 40 mg via ORAL
  Filled 2019-03-30: qty 2

## 2019-03-30 MED ORDER — PRO-STAT SUGAR FREE PO LIQD
30.0000 mL | Freq: Two times a day (BID) | ORAL | Status: DC
  Administered 2019-03-30 – 2019-04-01 (×4): 30 mL via ORAL
  Filled 2019-03-30 (×4): qty 30

## 2019-03-30 MED ORDER — ENOXAPARIN SODIUM 40 MG/0.4ML ~~LOC~~ SOLN
40.0000 mg | SUBCUTANEOUS | Status: DC
  Administered 2019-03-30 – 2019-03-31 (×2): 40 mg via SUBCUTANEOUS
  Filled 2019-03-30 (×2): qty 0.4

## 2019-03-30 NOTE — Progress Notes (Signed)
PROGRESS NOTE  LYNDA CAPISTRAN WUJ:[id number] DOB: 17-May-1929 DOA: 03/28/2019 PCP: Billie Ruddy, MD  Brief History    85 year old Caucasian male-prior syncope lightheadedness since 09/2017, chronic LBBB, known carotid bruit, prostate cancer in the past-apparently 15 years of shuffling gait rigidity without formal diagnosis Parkinson's-[facility] medication Rytary to see if he would benefit Patient did great with this however he ran out of trial meds within the last week Son brought him to the hospital 7/18 multiple syncopal spells with possible seizure-like activity   evaluation by neurology revealed MRI showing new acute and subacute strokes in addition Currently undergoing work-up for new onset strokes-likely embolic given age   A & P  Seizure disorder DDX more likely dysautonomia from Parkinson's EEG nonspecific-Continue Sinemet ? STROKE? MRI confirmed strokes subacute/acute-stroke service to comment and discuss with family balance of risk benefit regarding anticoagulation Volume depletion, component of orthostasis Orthostatics [+] 7/19-risk of anticoagulation not minimal Creatinine is slowly improving Possible tachybradycardia syndrome-possible A. fib On monitors today 30 beats NSVT 3 AM this morning Reinitiating lower dose Cardizem 180 CD down from 360 dose tomorrow morning Iron deficiency anemia/mild thrombocytopenia Thrombocytopenia, anemia stable Recent kyphoplasty T12-outpatient management  severe malnutrition, Body mass index is 14.34 kg/m.  Probable osteoporosis and osteopenia Independent risk factor for mortality-family needs to discuss this with outpatient physicians Prior prostate cancer No records-outpatient PSA testing at [facility]  DVT prophylaxis: Lovenox Code Status: Full Family Communication: Called son and updated completely Disposition Plan: Needs skilled facility placement-social worker to be made aware likely can discharge once  neurology determines risk-benefit of anticoagulation   Verneita Griffes, MD [facility] 9:39 AM  03/30/2019, 9:39 AM  LOS: 2 days   Consultants  . Neurology  Procedures  . EEG is pending  Antibiotics  . None at this time  Interval History/Subjective   Awake alert more coherent in no distress Nursing reports 8 full plate of food is hungry and has had no further issues no bleeding blood pressure was high today  Objective   Vitals:  Vitals:   03/30/19 0503 03/30/19 0732  BP: (!) 186/91 (!) 189/103  Pulse: 69 80  Resp: 17 16  Temp: 98.3 F (36.8 C) 99.4 F (37.4 C)  SpO2: 96% 97%    Exam:  Coherent x3 Extraocular movements intact Finger-nose-finger seems to have resolved to some degree compared to yesterday Chest is clear without added sound no rales he does have 2 bandages on his back S1-S2 rhythm as above Abdomen is soft nontender Able to raise both hands above head able to raise both feet above bed and  able to sit himself up   I have personally reviewed the following:   Today's Data  . As above  Lab Data  . BUN/creatinine down from admission 29/1 0.5-20 2/1.4-->21/1.19 . Baseline creatinine is less than 1.0 . Other labs wnl  Micro Data  . None  Imaging  . EEG 7/19-nonspecific encephalopathy . MRI 7/19 brain shows multiple strokes-right basal ganglia + right external capsule, subacute small vessel ischemia right periatrial right frontal subcortical matter remote lacunar infarcts as well  Cardiology Data  . As above he has PVCs and NSVT on monitors  Other Data  . None  Scheduled Meds: . aspirin  81 mg Oral Daily  . carbidopa-levodopa  1 tablet Oral TID  . enoxaparin (LOVENOX) injection  30 mg Subcutaneous Q24H  . sodium chloride flush  3 mL Intravenous Q12H   Continuous Infusions: .  sodium chloride 50 mL/hr at 03/29/19 1737    Active Problems:   Syncope   HTN (hypertension)   Prostate cancer (HCC)   Iron deficiency anemia   POTS  (postural orthostatic tachycardia syndrome)   Cerebral embolism with cerebral infarction   LOS: 2 days   How to contact the [facility] Attending or Consulting provider [place] or covering provider during after hours [place], for this patient?  1. Check the care team in [facility] and look for a) attending/consulting [facility] provider listed and b) the [facility] team listed 2. Log into [email or web address] and use [place]'s universal password to access. If you do not have the password, please contact the hospital operator. 3. Locate the [facility] provider you are looking for under [facility] and page to a number that you can be directly reached. 4. If you still have difficulty reaching the provider, please page the [facility] (Director on Call) for the Hospitalists listed on amion for assistance.

## 2019-03-30 NOTE — Progress Notes (Signed)
Initial Nutrition Assessment  DOCUMENTATION CODES:   Underweight, Severe malnutrition in context of chronic illness  INTERVENTION:   -Feeding assistance with meals -Magic cup TID with meals, each supplement provides 290 kcal and 9 grams of protein -MVI with minerals daily -30 ml Prostat BID, each supplement provides 100 kcals and 15 grams protein  NUTRITION DIAGNOSIS:   Severe Malnutrition related to chronic illness(Parkinson's disease) as evidenced by severe muscle depletion, severe fat depletion.  GOAL:   Patient will meet greater than or equal to 90% of their needs  MONITOR:   PO intake, Supplement acceptance, Diet advancement, Labs, Weight trends, Skin, I & O's  REASON FOR ASSESSMENT:   Malnutrition Screening Tool    ASSESSMENT:   Jose Baldwin is an 85 y.o. male with PMH of HTN, prostate cancer, he has frequent fall's and recently had a vertebral fracture (recent vertebroplasty done on 02/01/19), he has had PD symptoms with shuffling gait and rigidity for 15+ years. However, he lacks a formal diagnosis due to lack of neurology services at the [place] where he previously got his care. He is accompanied by his son who is very informed and active in pts care. Pt has transferred to local [place], but can't get in to see a specialist for nearly 43yr. His PCP did start him on Rytary while he awaited a Neurology consult as out pt, and he had excellent result and was able to walk again without walker, posture and speech improved as well as clear thinking. Unfortunately, this doctor is no longer there and no one is able to prescribe this medication again for him. Realizing this, his son thoughtfully did his best to wean off this med (rather than suddenly stopping it) with the remaining doses over the last month.  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 Per neurology notes, pt with incidental rt basal ganglia lacunar infarct secondary to small  vessel disease.   Reviewed I/O's: -1.2 L x 24 hours and -1.1 L since admission  UOP: 1.7 L x 24 hours  Pt sleeping soundly at time of visit and did not respond to voice or touch. Per SLP, pt with cognitive impairment.   Pt consuming 100% of meals per doc flowsheets.   Reviewed wt hx; pt has experienced a 10.9% wt loss over the past 8 months, which while not significant for time frame, is concerning given advanced age, Parkinson's disease, and malnutrition.   Labs reviewed.   NUTRITION - FOCUSED PHYSICAL EXAM:    Most Recent Value  Orbital Region  Severe depletion  Upper Arm Region  Severe depletion  Thoracic and Lumbar Region  Severe depletion  Buccal Region  Severe depletion  Temple Region  Severe depletion  Clavicle Bone Region  Severe depletion  Clavicle and Acromion Bone Region  Severe depletion  Scapular Bone Region  Severe depletion  Dorsal Hand  Severe depletion  Patellar Region  Severe depletion  Anterior Thigh Region  Severe depletion  Posterior Calf Region  Severe depletion  Edema (RD Assessment)  None  Hair  Reviewed  Eyes  Reviewed  Mouth  Reviewed  Skin  Reviewed  Nails  Reviewed       Diet Order:   Diet Order            DIET - DYS 1 Room service appropriate? Yes; Fluid consistency: Thin  Diet effective now              EDUCATION NEEDS:  No education needs have been identified at this time  Skin:  Skin Assessment: Skin Integrity Issues: Skin Integrity Issues:: Incisions Incisions: closed mid back  Last BM:  03/29/19  Height:   Ht Readings from Last 1 Encounters:  03/28/19 6\' 4"  (1.93 m)    Weight:   Wt Readings from Last 1 Encounters:  03/30/19 53.4 kg    Ideal Body Weight:  91.8 kg  BMI:  Body mass index is 14.34 kg/m.  Estimated Nutritional Needs:   Kcal:  1650-1850  Protein:  80-95 grams  Fluid:  > 1.6 L    Jose Baldwin A. Jose Baldwin, RD, LDN, [place] Registered Dietitian II Certified Diabetes Care and Education  Specialist Pager: [phone] After hours Pager: [phone]

## 2019-03-30 NOTE — Progress Notes (Signed)
STROKE TEAM PROGRESS NOTE   INTERVAL HISTORY I have personally reviewed patient's history of presenting illness in detail with him.  He is denying any focal neurological symptoms at present.  He has had recent multiple falls as well as syncopal episodes.  He was started on Rytary for gait initiation difficulties which responded well to treatment.  This is not available on formulary at [facility] hence we have switched him to Sinemet.  He has not tried walking yet.  He had MRI scan of the brain which shows patchy right basal ganglia/external capsule lacunar infarcts and evidence of previous remote age microhemorrhages and old lacune's Vitals:   03/30/19 0024 03/30/19 0503 03/30/19 0732 03/30/19 1129  BP: (!) 175/83 (!) 186/91 (!) 189/103 (!) 161/75  Pulse: 72 69 80 69  Resp: 17 17 16 17   Temp: 98.4 F (36.9 C) 98.3 F (36.8 C) 99.4 F (37.4 C) 98.2 F (36.8 C)  TempSrc: Oral Oral Oral Oral  SpO2: 94% 96% 97% 95%  Weight:  53.4 kg    Height:        CBC:  Recent Labs  Lab 03/24/19 1017 03/28/19 1344 03/30/19 0432  WBC 5.0 5.7 5.8  NEUTROABS 3.4  --  3.7  HGB 13.2 10.8* 11.1*  HCT 41.4 34.0* 34.0*  MCV 93.7 92.9 91.9  PLT 185 164 299    Basic Metabolic Panel:  Recent Labs  Lab 03/29/19 0332 03/30/19 0432  NA 140 139  K 3.5 3.8  CL 104 107  CO2 28 26  GLUCOSE 94 86  BUN 22 21  CREATININE 1.42* 1.19  CALCIUM 9.0 8.8*   Lipid Panel:     Component Value Date/Time   CHOL 207 (H) 03/30/2019 0432   TRIG 70 03/30/2019 0432   HDL 62 03/30/2019 0432   CHOLHDL 3.3 03/30/2019 0432   VLDL 14 03/30/2019 0432   LDLCALC 131 (H) 03/30/2019 0432   HgbA1c:  Lab Results  Component Value Date   HGBA1C 5.4 03/30/2019   Urine Drug Screen: No results found for: LABOPIA, COCAINSCRNUR, LABBENZ, AMPHETMU, THCU, LABBARB  Alcohol Level No results found for: ETH  IMAGING Ct Angio Head W Or Wo Contrast  Result Date: 03/29/2019 CLINICAL DATA:  Stroke follow-up. Acute and subacute right  cerebral hemispheric infarcts on MRI, most notable in the basal ganglia. EXAM: CT ANGIOGRAPHY HEAD AND NECK TECHNIQUE: Multidetector CT imaging of the head and neck was performed using the standard protocol during bolus administration of intravenous contrast. Multiplanar CT image reconstructions and MIPs were obtained to evaluate the vascular anatomy. Carotid stenosis measurements (when applicable) are obtained utilizing NASCET criteria, using the distal internal carotid diameter as the denominator. CONTRAST:  186mL OMNIPAQUE IOHEXOL 350 MG/ML SOLN COMPARISON:  Brain MRI 03/29/2019.  No prior angiographic imaging. FINDINGS: CT HEAD FINDINGS Brain: No acute intracranial hemorrhage, mass, midline shift, or extra-axial fluid collection is identified. Rounded hypodensity at the genu of the right internal capsule corresponds to the acute infarct on MRI. Multiple chronic infarcts are present in the basal ganglia bilaterally. Cerebral white matter hypodensities elsewhere bilaterally are nonspecific but compatible with moderate chronic small vessel ischemic disease. There is mild cerebral atrophy. Vascular: No hyperdense vessel. Skull: No fracture or focal osseous lesion. Sinuses: Paranasal sinuses and mastoid air cells are clear. Orbits: Bilateral cataract extraction. Review of the MIP images confirms the above findings CTA NECK FINDINGS Aortic arch: Incomplete imaging of the aortic arch with exclusion of the brachiocephalic and left common carotid artery origins. Moderate calcified  atherosclerosis in the visualized arch without significant left subclavian artery origin stenosis. Right carotid system: Patent with moderately extensive, predominantly calcified plaque throughout the common carotid artery not resulting in significant stenosis. Prominent calcified plaque in the proximal ICA results in 50% stenosis. Left carotid system: Patent with scattered, predominantly calcified plaque in the common carotid artery not  resulting in stenosis. Calcified plaque in the proximal ICA results in a focal high-grade stenosis estimated at 85%. The more distal cervical ICA is widely patent and of normal caliber. Vertebral arteries: The vertebral arteries are patent with the left being strongly dominant. No significant stenosis or evidence of dissection. Skeleton: Advanced cervical disc and facet degeneration. Interbody and facet ankylosis at C4-5. Mild multilevel degenerative listhesis. Mild anterior compression deformities of the T1 and T2 vertebral bodies, also present on a 01/21/2019 cervical spine MRI. Other neck: No evidence of cervical lymphadenopathy or mass. Upper chest: Mild emphysematous changes in the lung apices. Review of the MIP images confirms the above findings CTA HEAD FINDINGS Anterior circulation: The internal carotid arteries are patent from skull base to carotid termini with mild atherosclerotic plaque bilaterally not resulting in significant stenosis. ACAs and MCAs are patent with mild branch vessel irregularity but no evidence of proximal branch occlusion or significant proximal stenosis. No aneurysm is identified. Posterior circulation: The intracranial vertebral arteries are patent to the basilar with the left being strongly dominant. Patent AICA and SCA origins are visualized bilaterally. The basilar artery is widely patent. There are left larger than right posterior communicating arteries. Both PCAs are patent with mild branch vessel irregularity but no evidence of significant proximal stenosis. No aneurysm is identified. Venous sinuses: Patent. Anatomic variants: None. Review of the MIP images confirms the above findings IMPRESSION: 1. High-grade proximal left ICA stenosis due to calcified plaque, estimated at 85%. 2. 50% proximal right ICA stenosis. 3. Patent vertebral arteries with the left being strongly dominant. No significant stenosis. 4. Mild intracranial atherosclerosis without major branch occlusion or  significant proximal stenosis. 5.  Aortic Atherosclerosis (ICD10-I70.0). Electronically Signed   By: Logan Bores M.D.   On: 03/29/2019 16:25   Ct Angio Neck W Or Wo Contrast  Result Date: 03/29/2019 CLINICAL DATA:  Stroke follow-up. Acute and subacute right cerebral hemispheric infarcts on MRI, most notable in the basal ganglia. EXAM: CT ANGIOGRAPHY HEAD AND NECK TECHNIQUE: Multidetector CT imaging of the head and neck was performed using the standard protocol during bolus administration of intravenous contrast. Multiplanar CT image reconstructions and MIPs were obtained to evaluate the vascular anatomy. Carotid stenosis measurements (when applicable) are obtained utilizing NASCET criteria, using the distal internal carotid diameter as the denominator. CONTRAST:  17mL OMNIPAQUE IOHEXOL 350 MG/ML SOLN COMPARISON:  Brain MRI 03/29/2019.  No prior angiographic imaging. FINDINGS: CT HEAD FINDINGS Brain: No acute intracranial hemorrhage, mass, midline shift, or extra-axial fluid collection is identified. Rounded hypodensity at the genu of the right internal capsule corresponds to the acute infarct on MRI. Multiple chronic infarcts are present in the basal ganglia bilaterally. Cerebral white matter hypodensities elsewhere bilaterally are nonspecific but compatible with moderate chronic small vessel ischemic disease. There is mild cerebral atrophy. Vascular: No hyperdense vessel. Skull: No fracture or focal osseous lesion. Sinuses: Paranasal sinuses and mastoid air cells are clear. Orbits: Bilateral cataract extraction. Review of the MIP images confirms the above findings CTA NECK FINDINGS Aortic arch: Incomplete imaging of the aortic arch with exclusion of the brachiocephalic and left common carotid artery origins. Moderate calcified atherosclerosis in  the visualized arch without significant left subclavian artery origin stenosis. Right carotid system: Patent with moderately extensive, predominantly calcified plaque  throughout the common carotid artery not resulting in significant stenosis. Prominent calcified plaque in the proximal ICA results in 50% stenosis. Left carotid system: Patent with scattered, predominantly calcified plaque in the common carotid artery not resulting in stenosis. Calcified plaque in the proximal ICA results in a focal high-grade stenosis estimated at 85%. The more distal cervical ICA is widely patent and of normal caliber. Vertebral arteries: The vertebral arteries are patent with the left being strongly dominant. No significant stenosis or evidence of dissection. Skeleton: Advanced cervical disc and facet degeneration. Interbody and facet ankylosis at C4-5. Mild multilevel degenerative listhesis. Mild anterior compression deformities of the T1 and T2 vertebral bodies, also present on a 01/21/2019 cervical spine MRI. Other neck: No evidence of cervical lymphadenopathy or mass. Upper chest: Mild emphysematous changes in the lung apices. Review of the MIP images confirms the above findings CTA HEAD FINDINGS Anterior circulation: The internal carotid arteries are patent from skull base to carotid termini with mild atherosclerotic plaque bilaterally not resulting in significant stenosis. ACAs and MCAs are patent with mild branch vessel irregularity but no evidence of proximal branch occlusion or significant proximal stenosis. No aneurysm is identified. Posterior circulation: The intracranial vertebral arteries are patent to the basilar with the left being strongly dominant. Patent AICA and SCA origins are visualized bilaterally. The basilar artery is widely patent. There are left larger than right posterior communicating arteries. Both PCAs are patent with mild branch vessel irregularity but no evidence of significant proximal stenosis. No aneurysm is identified. Venous sinuses: Patent. Anatomic variants: None. Review of the MIP images confirms the above findings IMPRESSION: 1. High-grade proximal left  ICA stenosis due to calcified plaque, estimated at 85%. 2. 50% proximal right ICA stenosis. 3. Patent vertebral arteries with the left being strongly dominant. No significant stenosis. 4. Mild intracranial atherosclerosis without major branch occlusion or significant proximal stenosis. 5.  Aortic Atherosclerosis (ICD10-I70.0). Electronically Signed   By: Logan Bores M.D.   On: 03/29/2019 16:25   Mr Brain Wo Contrast  Result Date: 03/29/2019 CLINICAL DATA:  Initial evaluation for possible syncope versus seizure, presumed history of Parkinson's disease. EXAM: MRI HEAD WITHOUT CONTRAST TECHNIQUE: Multiplanar, multiecho pulse sequences of the brain and surrounding structures were obtained without intravenous contrast. COMPARISON:  None available. FINDINGS: Brain: Diffuse prominence of the CSF containing spaces compatible with generalized age-related cerebral atrophy. Patchy and confluent T2/FLAIR hyperintensity within the periventricular and deep white matter both cerebral hemispheres most consistent with chronic microvascular ischemic disease, moderate in nature. Scattered superimposed remote lacunar infarcts present within the bilateral basal ganglia. Associated chronic hemosiderin staining about several of these infarcts. 13 mm focus of restricted diffusion seen involving the right lentiform nucleus/genu of the internal capsule (series 5, image 72). Additional patchy involvement of the adjacent right caudate, external capsule, and subinsular white matter (series 5, image 70). Probable mild petechial hemorrhage at the level of the internal capsule (series 14, image 32). No significant mass effect. 13 mm focus of vague diffusion abnormality involving the right posterior periatrial white matter without ADC correlate likely reflects the sequelae of subacute small vessel ischemia (series 5, image 72). Additional similar focus seen involving the high subcortical right frontal lobe (series 5, image 83). No other  evidence for acute or subacute ischemia. Gray-white matter differentiation otherwise maintained. No other areas of remote cortical infarction. No other evidence for acute  or chronic intracranial hemorrhage. No mass lesion, midline shift or mass effect. Mild diffuse ventricular prominence related to global parenchymal volume loss without hydrocephalus. No extra-axial fluid collection. Pituitary gland suprasellar region normal. Midline structures intact and normal. No intrinsic temporal lobe abnormality. Evaluation of the pars compacta limited on this non high-resolution exam. Vascular: Major intracranial vascular flow voids are maintained. Skull and upper cervical spine: Craniocervical junction within normal limits. Exaggeration of the normal cervical lordosis noted. Bone marrow signal intensity within normal limits. 14 mm well-circumscribed T2 hyperintense cystic lesion within the subcutaneous fat of the suboccipital scalp noted, likely a sebaceous cyst. Scalp soft tissues demonstrate no acute finding. Sinuses/Orbits: Patient status post bilateral ocular lens replacement. Globes and orbital soft tissues demonstrate no acute finding. Paranasal sinuses are largely clear. Trace opacity bilateral mastoid air cells, of doubtful significance. Inner ear structures grossly normal. Other: None. IMPRESSION: 1. Patchy small volume acute ischemic infarcts involving the right basal ganglia, with extension into the right external capsule/sub insular white matter as above. Associated mild petechial hemorrhage without hemorrhagic transformation or significant mass effect. 2. Additional patchy small volume late subacute small vessel ischemic changes involving the right periatrial and right frontal subcortical white matter. 3. Underlying age-related cerebral atrophy with moderate chronic microvascular ischemic disease, with multiple remote lacunar infarcts involving the basal ganglia bilaterally. Electronically Signed   By: Jeannine Boga M.D.   On: 03/29/2019 04:08    PHYSICAL EXAM Frail cachectic malnourished looking elderly Caucasian male not in distress. . Afebrile. Head is nontraumatic. Neck is supple without bruit.    Cardiac exam no murmur or gallop. Lungs are clear to auscultation. Distal pulses are well felt. Neurological Exam :  He is awake alert.  Oriented x2.  Follows commands well.  Voice is hypophonic.  He has masked faces.  Jaw jerk is brisk.  Glabellar tap is positive.  Extraocular movements are full range without nystagmus.  Tongue is midline.  Motor system exam reveals no upper or lower extremity drift or focal weakness.  There is no resting tremor.  There is cogwheel rigidity at both rest upon activation.  Deep tendon flexes symmetric.  Sensation is intact.  Gait not tested. ASSESSMENT/PLAN Mr. Jose Baldwin is a 85 y.o. male with history of recently diagnosed Parkinson's disease, hypertension, prostate cancer and syncope with frequent falls admitted for syncope with concern for seizures as component of convulsive syncope and patient with Parkinson's.  MRI showed acute and subacute infarcts  Stroke:   Incidental R basal ganglia lacunar infarct secondary to small vessel disease  CTA head & neck L ICA stenosis 85%.  R ICA stenosis 50%.  Mild intracranial atherosclerosis.  Aortic atherosclerosis.  MRI  patchy R BG infarct with extension into IC/sub-insular white matter with associated mild petechial hemorrhage.  Additional patchy subacute ischemic small vessel changes in R periatrial and R frontal subcortical white matter.  Atrophy.  Moderate small vessel disease.  Multiple old B BG infarcts.  2D Echo EF 50 to 55%.  No SOE  EEG generalized irregular slow activity.   LDL 131  HgbA1c 5.4  TSH normal   Lovenox 40 mg subcu daily for VTE prophylaxis  No antithrombotic prior to admission, now on aspirin 81 mg daily. Given mild stroke, recommend aspirin 81 mg and plavix 75 mg daily x 3 weeks, then  aspirin alone. Orders adjusted.   Therapy recommendations: SNF  Disposition: Pending  Carotid stenosis, asymptomatic  L > R carotid stenosis  CTA head &  neck L ICA stenosis 85%.  R ICA stenosis 50%.   Consider VVS follow up  Hypertension  Stable . Permissive hypertension (OK if < 220/120) but gradually normalize in 5-7 days . Long-term BP goal normotensive  Hyperlipidemia  Home meds:  No statin  LDL 130, goal < 70  Add high intensity statin  Continue statin at discharge  Other Stroke Risk Factors  Advanced age  Former cigarette smoker, quit 30 years ago   ETOH use, advised to drink no more than 2 drink(s) a day  Other Active Problems  Parkinson's disease  Syncopal convulsions and symptomatic bradycardia  Frequent falls with injury  Hospital day # 2  I have personally obtained history,examined this patient, reviewed notes, independently viewed imaging studies, participated in medical decision making and plan of care.ROS completed by me personally and pertinent positives fully documented  I have made any additions or clarifications directly to the above note.  He presented with history of recurrent falls and syncopal episodes with some a kinetic rigid parkinsonian features mostly with gait difficulties with some recent response to Rytary but MRI scan shows patchy right basal ganglia acute as well as some subacute and old lacunar infarcts from small vessel disease.  CT angiogram does show significant bilateral left greater than right extracranial stenosis as well.Recommend aspirin 81 mg and plavix 75 mg daily x 3 weeks, then aspirin alone.  Physical therapy consult for gait and balance training.  Agree with Sinemet trial for gait apraxia.  Discussed with Dr. Verlon Au.  Greater than 50% time during this 35-minute visit was spent on counseling and coordination of care about his parkinsonian gait difficulties, lacunar basal ganglia infarcts and answering questions  Antony Contras, MD [facility] Pager: [phone] 03/30/2019 3:31 PM   To contact Stroke Continuity provider, please refer to [email or web address] After hours, contact General Neurology

## 2019-03-30 NOTE — Progress Notes (Signed)
  Echocardiogram 2D Echocardiogram has been performed.   Jose Baldwin 03/30/2019, 9:27 AM

## 2019-03-30 NOTE — NC FL2 (Signed)
[place] LEVEL OF CARE SCREENING TOOL     IDENTIFICATION  Patient Name: Jose Baldwin Birthdate: [birth year] Sex: male Admission Date (Current Location): 03/28/2019  [facility] and [place] Number:  Herbalist and Address:  [facility] [place] [place] [address], [place], [place] [address]      Provider Number: [id number]  Attending Physician Name and Address:  Nita Sells, MD  Relative Name and Phone Number:       Current Level of Care: Hospital Recommended Level of Care: [place] Prior Approval Number:    Date Approved/Denied:   PASRR Number: [id number]  Discharge Plan: SNF    Current Diagnoses: Patient Active Problem List   Diagnosis Date Noted  . Cerebral embolism with cerebral infarction 03/30/2019  . POTS (postural orthostatic tachycardia syndrome) 03/28/2019  . HTN (hypertension) 02/04/2018  . Prostate cancer ([place]) 02/04/2018  . Orthostatic hypotension 02/04/2018  . Iron deficiency anemia 02/04/2018  . Syncope 02/02/2018    Orientation RESPIRATION BLADDER Height & Weight     Self, Time, Situation, Place  Normal Continent Weight: 117 lb 12.8 oz (53.4 kg)(scale a) Height:  6\' 4"  (193 cm)  BEHAVIORAL SYMPTOMS/MOOD NEUROLOGICAL BOWEL NUTRITION STATUS  (None) (None) Continent Diet(DYS 1)  AMBULATORY STATUS COMMUNICATION OF NEEDS Skin   Limited Assist Verbally Surgical wounds, Skin abrasions, Bruising                       Personal Care Assistance Level of Assistance  Bathing, Feeding, Dressing Bathing Assistance: Limited assistance Feeding assistance: Limited assistance Dressing Assistance: Limited assistance     Functional Limitations Info  Sight, Hearing, Speech Sight Info: Adequate Hearing Info: Adequate Speech Info: Adequate    SPECIAL CARE FACTORS FREQUENCY  PT (By licensed PT), OT (By licensed OT), Speech therapy     PT Frequency: 5 x week OT Frequency: 5 x week     Speech  Therapy Frequency: 5 x week      Contractures Contractures Info: Not present    Additional Factors Info  Code Status, Allergies Code Status Info: Full code Allergies Info: NKDA           Current Medications (03/30/2019):  This is the current hospital active medication list Current Facility-Administered Medications  Medication Dose Route Frequency Provider Last Rate Last Dose  . aspirin chewable tablet 81 mg  81 mg Oral Daily Metzger-Cihelka, Desiree, NP   81 mg at 03/30/19 1018  . atorvastatin (LIPITOR) tablet 40 mg  40 mg Oral q1800 Burnetta Sabin L, NP      . carbidopa-levodopa (SINEMET IR) 25-100 MG per tablet immediate release 1 tablet  1 tablet Oral TID Hammons, Kimberly B, RPH   1 tablet at 03/30/19 1018  . clopidogrel (PLAVIX) tablet 75 mg  75 mg Oral Daily Biby, Sharon L, NP      . enoxaparin (LOVENOX) injection 40 mg  40 mg Subcutaneous Q24H Richardine Service, RPH      . feeding supplement (PRO-STAT SUGAR FREE 64) liquid 30 mL  30 mL Oral BID Nita Sells, MD      . hydrALAZINE (APRESOLINE) injection 5 mg  5 mg Intravenous Q6H PRN Nita Sells, MD      . multivitamin with minerals tablet 1 tablet  1 tablet Oral Daily Samtani, Jai-Gurmukh, MD      . sodium chloride flush (NS) 0.9 % injection 3 mL  3 mL Intravenous Q12H Nita Sells, MD  Discharge Medications: Please see discharge summary for a list of discharge medications.  Relevant Imaging Results:  Relevant Lab Results:   Additional Information SS#: [id number]  Candie Chroman, LCSW

## 2019-03-30 NOTE — Progress Notes (Signed)
Pt has elevated BP this am. Provider on-call has been paged via [place]. Waiting for response

## 2019-03-30 NOTE — TOC Initial Note (Signed)
Transition of Care [facility]) - Initial/Assessment Note    Patient Details  Name: Jose Baldwin MRN: [id number] Date of Birth: Jan 25, 1929  Transition of Care [facility] CM/SW Contact:    Candie Chroman, LCSW Phone Number: 03/30/2019, 4:56 PM  Clinical Narrative: CSW met with patient, introduced role, and explained that PT recommendations would be discussed. Patient agreeable to SNF placement. CSW provided list of CMS scores for facilities within 50 miles of his zip code. He gave CSW permission to contact his son. Son is agreeable to SNF but stated he discharged from [facility] on Father's Day and that he was there more than 20 days. CSW notified him that if so patient would be in his copay days of around $160 per day and that this would not start back over until he has been out of a SNF/hospital for 60 days. Son would now prefer for him to return home at discharge. First preference home health agency is [facility] but they are full and unable to take a new referral. Will call son tomorrow regarding other options. No further concerns. CSW encouraged patient and son to contact CSW as needed. CSW will continue to follow patient and his son for support and facilitate return home once stable.                 Expected Discharge Plan: [place] Barriers to Discharge: Continued Medical Work up   Patient Goals and CMS Choice Patient states their goals for this hospitalization and ongoing recovery are:: "To get well so I can go home." CMS Medicare.gov Compare Post Acute Care list provided to:: Patient    Expected Discharge Plan and Services Expected Discharge Plan: [place] Choice: [place] arrangements for the past 2 months: Mobile Home                 DME Arranged: N/A         HH Arranged: RN, PT, OT, Nurse's Aide, Speech Therapy, Social Work          Prior Living Arrangements/Services Living  arrangements for the past 2 months: Mobile Home Lives with:: Self Patient language and need for interpreter reviewed:: Yes Do you feel safe going back to the place where you live?: Yes      Need for Family Participation in Patient Care: Yes (Comment) Care giver support system in place?: Yes (comment) Current home services: Housekeeping Criminal Activity/Legal Involvement Pertinent to Current Situation/Hospitalization: No - Comment as needed  Activities of Daily Living Home Assistive Devices/Equipment: Environmental consultant (specify type) ADL Screening (condition at time of admission) Patient's cognitive ability adequate to safely complete daily activities?: Yes Is the patient deaf or have difficulty hearing?: No Does the patient have difficulty seeing, even when wearing glasses/contacts?: No Does the patient have difficulty concentrating, remembering, or making decisions?: No Patient able to express need for assistance with ADLs?: Yes Does the patient have difficulty dressing or bathing?: Yes Independently performs ADLs?: No Communication: Needs assistance Is this a change from baseline?: Pre-admission baseline Dressing (OT): Needs assistance Is this a change from baseline?: Pre-admission baseline Grooming: Needs assistance Is this a change from baseline?: Pre-admission baseline Feeding: Independent Bathing: Needs assistance Is this a change from baseline?: Pre-admission baseline Toileting: Needs assistance Is this a change from baseline?: Pre-admission baseline In/Out Bed: Needs assistance Is this a change from baseline?: Pre-admission baseline Walks in Home: Independent  Does the patient have difficulty walking or climbing stairs?: Yes Weakness of Legs: Both Weakness of Arms/Hands: None  Permission Sought/Granted Permission sought to share information with : Facility Art therapist granted to share information with : Yes, Verbal Permission Granted  Share Information with  NAME: Daren Pierre     Permission granted to share info w Relationship: Son  Permission granted to share info w Contact Information: [phone]  Emotional Assessment Appearance:: Appears stated age Attitude/Demeanor/Rapport: Engaged, Gracious Affect (typically observed): Accepting, Appropriate, Calm, Pleasant Orientation: : Oriented to Self, Oriented to Place, Oriented to  Time Alcohol / Substance Use: Never Used Psych Involvement: No (comment)  Admission diagnosis:  Bradycardia [R00.1] Loss of consciousness ([place]) [R40.20] Patient Active Problem List   Diagnosis Date Noted  . Cerebral embolism with cerebral infarction 03/30/2019  . POTS (postural orthostatic tachycardia syndrome) 03/28/2019  . HTN (hypertension) 02/04/2018  . Prostate cancer ([place]) 02/04/2018  . Orthostatic hypotension 02/04/2018  . Iron deficiency anemia 02/04/2018  . Syncope 02/02/2018   PCP:  Billie Ruddy, MD Pharmacy:   [facility] [place], [address] AT [place] [address] [place] [address] Phone: [phone] Fax: [place], [address] [place] [address] Phone: [phone] Fax: [phone]     Social Determinants of Health (SDOH) Interventions    Readmission Risk Interventions No flowsheet data found.

## 2019-03-30 NOTE — Evaluation (Signed)
Speech Language Pathology Evaluation Patient Details Name: Jose Baldwin MRN: [id number] DOB: [birth year] Today's Date: 03/30/2019 Time: 2671-2458 SLP Time Calculation (min) (ACUTE ONLY): 36 min  Problem List:  Patient Active Problem List   Diagnosis Date Noted  . Cerebral embolism with cerebral infarction 03/30/2019  . POTS (postural orthostatic tachycardia syndrome) 03/28/2019  . HTN (hypertension) 02/04/2018  . Prostate cancer ([place]) 02/04/2018  . Orthostatic hypotension 02/04/2018  . Iron deficiency anemia 02/04/2018  . Syncope 02/02/2018   Past Medical History:  Past Medical History:  Diagnosis Date  . Hypertension   . Prostate cancer ([place])   . Syncope    Past Surgical History:  Past Surgical History:  Procedure Laterality Date  . BACK SURGERY    . IR VERTEBROPLASTY CERV/THOR BX INC UNI/BIL INC/INJECT/IMAGING  01/31/2018  . IR VERTEBROPLASTY CERV/THOR BX INC UNI/BIL INC/INJECT/IMAGING  03/24/2019   HPI:  85 year old Caucasian male-prior syncope lightheadedness since 09/2017, chronic LBBB, known carotid bruit, prostate cancer in the past-apparently 15 years of shuffling gait rigidity without formal diagnosis Parkinson's-[facility] medication Rytary to see if he would benefit. He did great but is no longer able to get medication. Admitted for syncope of unknown origin 17/18/20. Neurology is recommending titration to Sinemet. MRI showed scattered acute and subacute infarcts.   Assessment / Plan / Recommendation Clinical Impression  Pt scored a 17/30 on the Boyd, with scores below 26 suggestive of cognitive impairment (question if his hearing also impacted overall score). He was not able to remember any words on a delayed recall task, and was disoriented to situation, not able to recall that he had a stroke. Pt's processing and speech are slowed and he is hypophonic but intelligible in a quiet environment. Pt says that the above is indicative of a decline  he has experienced over time from his Parkinson's disease, but he does not believe that they have acutely declined from his CVA. Therefore, although he may not need SLP f/u acutely, he would benefit from SLP f/u at next level of care to maximize cognition and communication. Recommend SNF for f/u unless 24/7 supervision can be provided at home.    SLP Assessment  SLP Recommendation/Assessment: All further Speech Lanaguage Pathology  needs can be addressed in the next venue of care SLP Visit Diagnosis: Dysarthria and anarthria (R47.1);Cognitive communication deficit (R41.841)    Follow Up Recommendations  Skilled Nursing facility;24 hour supervision/assistance    Frequency and Duration           SLP Evaluation Cognition  Overall Cognitive Status: History of cognitive impairments - at baseline Arousal/Alertness: Awake/alert Orientation Level: Oriented to person;Oriented to place;Oriented to time;Disoriented to situation Attention: Selective Selective Attention: Impaired Selective Attention Impairment: Verbal basic Memory: Impaired Memory Impairment: Storage deficit;Retrieval deficit;Decreased recall of new information Awareness: Appears intact Problem Solving: Impaired Problem Solving Impairment: Verbal complex;Functional complex Behaviors: Other (comment)(masked face/flat affect) Safety/Judgment: Appears intact       Comprehension  Auditory Comprehension Overall Auditory Comprehension: Impaired Commands: Impaired Complex Commands: 25-49% accurate Interfering Components: Hearing    Expression Expression Primary Mode of Expression: Verbal Verbal Expression Overall Verbal Expression: Impaired Naming: Impairment Divergent: 50-74% accurate Non-Verbal Means of Communication: Not applicable   Oral / Motor  Motor Speech Overall Motor Speech: Impaired Respiration: Within functional limits Phonation: Low vocal intensity Resonance: Within functional limits Articulation: Within  functional limitis Intelligibility: Intelligible   GO  Jose Baldwin 03/30/2019, 1:02 PM  Pollyann Glen, M.A. [place] Acute Environmental education officer [phone] Office [phone]

## 2019-03-31 DIAGNOSIS — E43 Unspecified severe protein-calorie malnutrition: Secondary | ICD-10-CM | POA: Insufficient documentation

## 2019-03-31 MED ORDER — ASPIRIN 81 MG PO CHEW: 81.0000 mg | CHEWABLE_TABLET | Freq: Every day | ORAL | 0 refills | Status: AC

## 2019-03-31 MED ORDER — CARBIDOPA-LEVODOPA 25-100 MG PO TABS: 1.0000 | ORAL_TABLET | Freq: Three times a day (TID) | ORAL | 0 refills | Status: DC

## 2019-03-31 MED ORDER — SODIUM CHLORIDE 0.9 % IV SOLN
510.0000 mg | Freq: Once | INTRAVENOUS | Status: AC
  Administered 2019-03-31: 510 mg via INTRAVENOUS
  Filled 2019-03-31: qty 17

## 2019-03-31 MED ORDER — CLOPIDOGREL BISULFATE 75 MG PO TABS: 75.0000 mg | ORAL_TABLET | Freq: Every day | ORAL | 0 refills | Status: AC

## 2019-03-31 MED ORDER — ROSUVASTATIN CALCIUM 40 MG PO TABS: 40.0000 mg | ORAL_TABLET | Freq: Every day | ORAL | 0 refills | Status: DC

## 2019-03-31 MED ORDER — CARBIDOPA-LEVODOPA 25-100 MG PO TABS: 1.0000 | ORAL_TABLET | Freq: Three times a day (TID) | ORAL | 2 refills | Status: DC

## 2019-03-31 MED ORDER — CLOPIDOGREL BISULFATE 75 MG PO TABS: 75.0000 mg | ORAL_TABLET | Freq: Every day | ORAL | 0 refills | Status: DC

## 2019-03-31 NOTE — TOC Progression Note (Addendum)
Transition of Care [facility]) - Progression Note    Patient Details  Name: Jose Baldwin MRN: [id number] Date of Birth: 11-25-1928  Transition of Care [facility]) CM/SW [place], LCSW Phone Number: 03/31/2019, 10:09 AM  Clinical Narrative: Notified patient's son that [facility] is unable to accept him. Emailed him list of CMS scores for home health agencies that serve patient's zip code. Asked him to review and choose top 3 preferences.   2:20 pm: Son tried calling [facility] and [facility] but they are unable to provide services. Son agreeable to [place] working down list based on star rating. CSW has left messages with [facility], [facility], and [facility]. Son is aware patient is discharging today as long as home health is set up.  3:59 pm: [facility] has accepted patient for home health services. Son aware and agreeable. They can start services on Friday. CSW faxed orders. CSW sent message to MD requesting CSW be added to order. Son unable to pick up patient this afternoon but can do so in the morning. RN aware. Son stated patient gets his medications from the [place] and his local pharmacy so there may be a two-day gap between getting his medications. Sent message to MD also to see if we can send him home with 2-3 days worth of medications. RNCM is aware for tomorrow.  Expected Discharge Plan: [place] Barriers to Discharge: Continued Medical Work up  Expected Discharge Plan and Services Expected Discharge Plan: [place] Choice: [place] arrangements for the past 2 months: Mobile Home                 DME Arranged: N/A         HH Arranged: RN, PT, OT, Nurse's Aide, Speech Therapy, Social Work           Social Determinants of Health (SDOH) Interventions    Readmission Risk Interventions No flowsheet data found.

## 2019-03-31 NOTE — Progress Notes (Signed)
Patient's son informed need to pick patient up for discharge he said he was not ready would pick patient up in the morning to call us back and let us know,Sara Boswell Case worker informed she alerted MD there would be a delay in discharging patient due to family not prepared to receive patient this time. Writer spoke with Wallene Dales he will call back in am update [place] when going to pick patient up.

## 2019-03-31 NOTE — Care Management Important Message (Signed)
Important Message  Patient Details  Name: Jose Baldwin MRN: [id number] Date of Birth: 05-07-1929   Medicare Important Message Given:  Yes     [name] 03/31/2019, 11:23 AM

## 2019-03-31 NOTE — Plan of Care (Signed)
  Problem: Health Behavior/Discharge Planning: Goal: Ability to manage health-related needs will improve Outcome: Progressing   Problem: Clinical Measurements: Goal: Ability to maintain clinical measurements within normal limits will improve Outcome: Progressing   Problem: Activity: Goal: Risk for activity intolerance will decrease Outcome: Progressing   Problem: Coping: Goal: Level of anxiety will decrease Outcome: Progressing   Problem: Pain Managment: Goal: General experience of comfort will improve Outcome: Progressing   Problem: Safety: Goal: Ability to remain free from injury will improve Outcome: Progressing   Problem: Skin Integrity: Goal: Risk for impaired skin integrity will decrease Outcome: Progressing   Problem: Education: Goal: Knowledge of disease or condition will improve Outcome: Progressing Goal: Knowledge of secondary prevention will improve Outcome: Progressing Goal: Knowledge of patient specific risk factors addressed and post discharge goals established will improve Outcome: Progressing   Problem: Coping: Goal: Will verbalize positive feelings about self Outcome: Progressing   Problem: Health Behavior/Discharge Planning: Goal: Ability to manage health-related needs will improve Outcome: Progressing   Problem: Self-Care: Goal: Ability to participate in self-care as condition permits will improve Outcome: Progressing Goal: Ability to communicate needs accurately will improve Outcome: Progressing   Problem: Nutrition: Goal: Risk of aspiration will decrease Outcome: Progressing

## 2019-03-31 NOTE — Progress Notes (Signed)
STROKE TEAM PROGRESS NOTE   INTERVAL HISTORY He is sitting up in bedside chair.  He is eating his meal.  He walked with physical therapy using a walker.  He has no complaints no fainting episodes orfalls. Vitals:   03/30/19 1626 03/30/19 1937 03/31/19 0428 03/31/19 1131  BP: (!) 191/91 (!) 181/100 (!) 161/102 134/79  Pulse: 87 69 79 (!) 45  Resp: 16 18 18 18   Temp: 99 F (37.2 C) 98.9 F (37.2 C) 97.8 F (36.6 C) (!) 97.5 F (36.4 C)  TempSrc: Oral Oral Oral Oral  SpO2: 96% 91% 95% 99%  Weight:   53.9 kg   Height:        CBC:  Recent Labs  Lab 03/28/19 1344 03/30/19 0432  WBC 5.7 5.8  NEUTROABS  --  3.7  HGB 10.8* 11.1*  HCT 34.0* 34.0*  MCV 92.9 91.9  PLT 164 811    Basic Metabolic Panel:  Recent Labs  Lab 03/29/19 0332 03/30/19 0432  NA 140 139  K 3.5 3.8  CL 104 107  CO2 28 26  GLUCOSE 94 86  BUN 22 21  CREATININE 1.42* 1.19  CALCIUM 9.0 8.8*   Lipid Panel:     Component Value Date/Time   CHOL 207 (H) 03/30/2019 0432   TRIG 70 03/30/2019 0432   HDL 62 03/30/2019 0432   CHOLHDL 3.3 03/30/2019 0432   VLDL 14 03/30/2019 0432   LDLCALC 131 (H) 03/30/2019 0432   HgbA1c:  Lab Results  Component Value Date   HGBA1C 5.4 03/30/2019   Urine Drug Screen: No results found for: LABOPIA, COCAINSCRNUR, LABBENZ, AMPHETMU, THCU, LABBARB  Alcohol Level No results found for: ETH  IMAGING Ct Angio Head W Or Wo Contrast  Result Date: 03/29/2019 CLINICAL DATA:  Stroke follow-up. Acute and subacute right cerebral hemispheric infarcts on MRI, most notable in the basal ganglia. EXAM: CT ANGIOGRAPHY HEAD AND NECK TECHNIQUE: Multidetector CT imaging of the head and neck was performed using the standard protocol during bolus administration of intravenous contrast. Multiplanar CT image reconstructions and MIPs were obtained to evaluate the vascular anatomy. Carotid stenosis measurements (when applicable) are obtained utilizing NASCET criteria, using the distal internal  carotid diameter as the denominator. CONTRAST:  171mL OMNIPAQUE IOHEXOL 350 MG/ML SOLN COMPARISON:  Brain MRI 03/29/2019.  No prior angiographic imaging. FINDINGS: CT HEAD FINDINGS Brain: No acute intracranial hemorrhage, mass, midline shift, or extra-axial fluid collection is identified. Rounded hypodensity at the genu of the right internal capsule corresponds to the acute infarct on MRI. Multiple chronic infarcts are present in the basal ganglia bilaterally. Cerebral white matter hypodensities elsewhere bilaterally are nonspecific but compatible with moderate chronic small vessel ischemic disease. There is mild cerebral atrophy. Vascular: No hyperdense vessel. Skull: No fracture or focal osseous lesion. Sinuses: Paranasal sinuses and mastoid air cells are clear. Orbits: Bilateral cataract extraction. Review of the MIP images confirms the above findings CTA NECK FINDINGS Aortic arch: Incomplete imaging of the aortic arch with exclusion of the brachiocephalic and left common carotid artery origins. Moderate calcified atherosclerosis in the visualized arch without significant left subclavian artery origin stenosis. Right carotid system: Patent with moderately extensive, predominantly calcified plaque throughout the common carotid artery not resulting in significant stenosis. Prominent calcified plaque in the proximal ICA results in 50% stenosis. Left carotid system: Patent with scattered, predominantly calcified plaque in the common carotid artery not resulting in stenosis. Calcified plaque in the proximal ICA results in a focal high-grade stenosis estimated at 85%.  The more distal cervical ICA is widely patent and of normal caliber. Vertebral arteries: The vertebral arteries are patent with the left being strongly dominant. No significant stenosis or evidence of dissection. Skeleton: Advanced cervical disc and facet degeneration. Interbody and facet ankylosis at C4-5. Mild multilevel degenerative listhesis. Mild  anterior compression deformities of the T1 and T2 vertebral bodies, also present on a 01/21/2019 cervical spine MRI. Other neck: No evidence of cervical lymphadenopathy or mass. Upper chest: Mild emphysematous changes in the lung apices. Review of the MIP images confirms the above findings CTA HEAD FINDINGS Anterior circulation: The internal carotid arteries are patent from skull base to carotid termini with mild atherosclerotic plaque bilaterally not resulting in significant stenosis. ACAs and MCAs are patent with mild branch vessel irregularity but no evidence of proximal branch occlusion or significant proximal stenosis. No aneurysm is identified. Posterior circulation: The intracranial vertebral arteries are patent to the basilar with the left being strongly dominant. Patent AICA and SCA origins are visualized bilaterally. The basilar artery is widely patent. There are left larger than right posterior communicating arteries. Both PCAs are patent with mild branch vessel irregularity but no evidence of significant proximal stenosis. No aneurysm is identified. Venous sinuses: Patent. Anatomic variants: None. Review of the MIP images confirms the above findings IMPRESSION: 1. High-grade proximal left ICA stenosis due to calcified plaque, estimated at 85%. 2. 50% proximal right ICA stenosis. 3. Patent vertebral arteries with the left being strongly dominant. No significant stenosis. 4. Mild intracranial atherosclerosis without major branch occlusion or significant proximal stenosis. 5.  Aortic Atherosclerosis (ICD10-I70.0). Electronically Signed   By: Logan Bores M.D.   On: 03/29/2019 16:25   Ct Angio Neck W Or Wo Contrast  Result Date: 03/29/2019 CLINICAL DATA:  Stroke follow-up. Acute and subacute right cerebral hemispheric infarcts on MRI, most notable in the basal ganglia. EXAM: CT ANGIOGRAPHY HEAD AND NECK TECHNIQUE: Multidetector CT imaging of the head and neck was performed using the standard protocol  during bolus administration of intravenous contrast. Multiplanar CT image reconstructions and MIPs were obtained to evaluate the vascular anatomy. Carotid stenosis measurements (when applicable) are obtained utilizing NASCET criteria, using the distal internal carotid diameter as the denominator. CONTRAST:  115mL OMNIPAQUE IOHEXOL 350 MG/ML SOLN COMPARISON:  Brain MRI 03/29/2019.  No prior angiographic imaging. FINDINGS: CT HEAD FINDINGS Brain: No acute intracranial hemorrhage, mass, midline shift, or extra-axial fluid collection is identified. Rounded hypodensity at the genu of the right internal capsule corresponds to the acute infarct on MRI. Multiple chronic infarcts are present in the basal ganglia bilaterally. Cerebral white matter hypodensities elsewhere bilaterally are nonspecific but compatible with moderate chronic small vessel ischemic disease. There is mild cerebral atrophy. Vascular: No hyperdense vessel. Skull: No fracture or focal osseous lesion. Sinuses: Paranasal sinuses and mastoid air cells are clear. Orbits: Bilateral cataract extraction. Review of the MIP images confirms the above findings CTA NECK FINDINGS Aortic arch: Incomplete imaging of the aortic arch with exclusion of the brachiocephalic and left common carotid artery origins. Moderate calcified atherosclerosis in the visualized arch without significant left subclavian artery origin stenosis. Right carotid system: Patent with moderately extensive, predominantly calcified plaque throughout the common carotid artery not resulting in significant stenosis. Prominent calcified plaque in the proximal ICA results in 50% stenosis. Left carotid system: Patent with scattered, predominantly calcified plaque in the common carotid artery not resulting in stenosis. Calcified plaque in the proximal ICA results in a focal high-grade stenosis estimated at 85%. The more  distal cervical ICA is widely patent and of normal caliber. Vertebral arteries: The  vertebral arteries are patent with the left being strongly dominant. No significant stenosis or evidence of dissection. Skeleton: Advanced cervical disc and facet degeneration. Interbody and facet ankylosis at C4-5. Mild multilevel degenerative listhesis. Mild anterior compression deformities of the T1 and T2 vertebral bodies, also present on a 01/21/2019 cervical spine MRI. Other neck: No evidence of cervical lymphadenopathy or mass. Upper chest: Mild emphysematous changes in the lung apices. Review of the MIP images confirms the above findings CTA HEAD FINDINGS Anterior circulation: The internal carotid arteries are patent from skull base to carotid termini with mild atherosclerotic plaque bilaterally not resulting in significant stenosis. ACAs and MCAs are patent with mild branch vessel irregularity but no evidence of proximal branch occlusion or significant proximal stenosis. No aneurysm is identified. Posterior circulation: The intracranial vertebral arteries are patent to the basilar with the left being strongly dominant. Patent AICA and SCA origins are visualized bilaterally. The basilar artery is widely patent. There are left larger than right posterior communicating arteries. Both PCAs are patent with mild branch vessel irregularity but no evidence of significant proximal stenosis. No aneurysm is identified. Venous sinuses: Patent. Anatomic variants: None. Review of the MIP images confirms the above findings IMPRESSION: 1. High-grade proximal left ICA stenosis due to calcified plaque, estimated at 85%. 2. 50% proximal right ICA stenosis. 3. Patent vertebral arteries with the left being strongly dominant. No significant stenosis. 4. Mild intracranial atherosclerosis without major branch occlusion or significant proximal stenosis. 5.  Aortic Atherosclerosis (ICD10-I70.0). Electronically Signed   By: Logan Bores M.D.   On: 03/29/2019 16:25    PHYSICAL EXAM Frail cachectic malnourished looking elderly  Caucasian male not in distress. . Afebrile. Head is nontraumatic. Neck is supple without bruit.    Cardiac exam no murmur or gallop. Lungs are clear to auscultation. Distal pulses are well felt. Neurological Exam :  He is awake alert.  Oriented x2.  Follows commands well.  Voice is hypophonic.  He has masked faces.  Jaw jerk is brisk.  Glabellar tap is positive.  Extraocular movements are full range without nystagmus.  Tongue is midline.  Motor system exam reveals no upper or lower extremity drift or focal weakness.  There is no resting tremor.  There is cogwheel rigidity at both rest upon activation.  Deep tendon flexes symmetric.  Sensation is intact.  Gait not tested. ASSESSMENT/PLAN Jose Baldwin is a 85 y.o. male with history of recently diagnosed Parkinson's disease, hypertension, prostate cancer and syncope with frequent falls admitted for syncope with concern for seizures as component of convulsive syncope and patient with Parkinson's.  MRI showed acute and subacute infarcts  Stroke:   Incidental R basal ganglia lacunar infarct secondary to small vessel disease  CTA head & neck L ICA stenosis 85%.  R ICA stenosis 50%.  Mild intracranial atherosclerosis.  Aortic atherosclerosis.  MRI  patchy R BG infarct with extension into IC/sub-insular white matter with associated mild petechial hemorrhage.  Additional patchy subacute ischemic small vessel changes in R periatrial and R frontal subcortical white matter.  Atrophy.  Moderate small vessel disease.  Multiple old B BG infarcts.  2D Echo EF 50 to 55%.  No SOE  EEG generalized irregular slow activity.   LDL 131  HgbA1c 5.4  TSH normal   Lovenox 40 mg subcu daily for VTE prophylaxis  No antithrombotic prior to admission, now on aspirin 81 mg daily. Given  mild stroke, recommend aspirin 81 mg and plavix 75 mg daily x 3 weeks, then aspirin alone. Orders adjusted.   Therapy recommendations: SNF  Disposition: Pending  Carotid stenosis,  asymptomatic  L > R carotid stenosis  CTA head & neck L ICA stenosis 85%.  R ICA stenosis 50%.   Consider VVS follow up  Hypertension  Stable . Permissive hypertension (OK if < 220/120) but gradually normalize in 5-7 days . Long-term BP goal normotensive  Hyperlipidemia  Home meds:  No statin  LDL 130, goal < 70  Add high intensity statin  Continue statin at discharge  Other Stroke Risk Factors  Advanced age  Former cigarette smoker, quit 30 years ago   ETOH use, advised to drink no more than 2 drink(s) a day  Other Active Problems  Parkinson's disease  Syncopal convulsions and symptomatic bradycardia  Frequent falls with injury  Hospital day # 3    He presented with history of recurrent falls and syncopal episodes with some a kinetic rigid parkinsonian features mostly with gait difficulties with some recent response to Rytary but MRI scan shows patchy right basal ganglia acute as well as some subacute and old lacunar infarcts from small vessel disease.  CT angiogram does show significant bilateral left greater than right extracranial stenosis as well.Recommend aspirin 81 mg and plavix 75 mg daily x 3 weeks, then aspirin alone.  Continue Sinemet trial for gait apraxia.  If he continues to have orthostatic hypotension and syncope may consider adding low-dose Mestinon in the future.  He will need outpatient follow-up with neurology after discharge.  Stroke team will sign off kindly call for questions.  Follow-up in the stroke clinic in 6 weeks discussed with Dr. Verlon Au.  Greater than 50% time during this 25-minute visit was spent on counseling and coordination of care about his parkinsonian gait difficulties, lacunar basal ganglia infarcts and answering questions  Antony Contras, MD [facility] Pager: [phone] 03/31/2019 1:12 PM   To contact Stroke Continuity provider, please refer to [email or web address] After hours, contact General Neurology

## 2019-03-31 NOTE — Discharge Summary (Addendum)
Physician Discharge Summary  Jose Baldwin HFW:[id number] DOB: 1928/11/13 DOA: 03/28/2019 Patient seen and examined as per my progress note no changes to this discharge summary stable to discharge PCP: Billie Ruddy, MD  Admit date: 03/28/2019 Discharge date: 03/31/2019  Time spent: 25 minutes  Recommendations for Outpatient Follow-up:  1. New meds sinsemet, asa + plavix---plavix to stop on 04/21/2019  2. Needs OP PT/OT SLP  3. Recommend OP iron therapy--probably IV  Discharge Diagnoses:  Active Problems:   Syncope   HTN (hypertension)   Prostate cancer (HCC)   Iron deficiency anemia   POTS (postural orthostatic tachycardia syndrome)   Cerebral embolism with cerebral infarction   Protein-calorie malnutrition, severe   Discharge Condition: good  Diet recommendation: dysphagia ii diet on d/c home  Filed Weights   03/29/19 0600 03/30/19 0503 03/31/19 0428  Weight: 53.9 kg 53.4 kg 53.9 kg    History of present illness:  85 year old Caucasian male-prior syncope lightheadedness since 09/2017, chronic LBBB, known carotid bruit, prostate cancer in the past-apparently 15 years of shuffling gait rigidity without formal diagnosis Parkinson's-[facility] medication Rytary to see if he would benefit Patient did great with this however he ran out of trial meds within the last week Son brought him to the hospital 7/18 multiple syncopal spells with possible seizure-like activity   evaluation by neurology revealed MRI showing new acute and subacute strokes in addition Currently undergoing work-up for new onset strokes-likely embolic given age  Hospital Course:  Seizure disorder DDX more likely dysautonomia from Parkinson's EEG nonspecific-Continue Sinemet Rx this admit ? STROKE? MRI confirmed strokes subacute/acute---asa + plavix 3 weeks then ASA alone after Need OP neurology input--Either stroke team or at the [facility] Volume depletion, component of  orthostasis Orthostatics [+] 7/19-risk of anticoagulation not minimal Creatinine improved on d/c from 29/1.58--->21/1.19 Possible tachybradycardia syndrome-possible A. fib On monitors today 30 beats NSVT 3 AM this morning Stopped cardizem completely on d/c Iron deficiency anemia/mild thrombocytopenia Thrombocytopenia, anemia stable sats low---IV iron given on day of d/c Not on FeSO4 as OP as Rytary Recent kyphoplasty T12-outpatient management  severe malnutrition, Body mass index is 14.34 kg/m.  Probable osteoporosis and osteopenia Independent risk factor for mortality-family needs to discuss this with outpatient physicians Prior prostate cancer No records-outpatient [facility]  Procedures:  Mri  Ct head  Echo   Consultations:  Neurology  Discharge Exam: Vitals:   03/31/19 0428 03/31/19 1131  BP: (!) 161/102 134/79  Pulse: 79 (!) 45  Resp: 18 18  Temp: 97.8 F (36.6 C) (!) 97.5 F (36.4 C)  SpO2: 95% 99%    General: eomi ncat  Cardiovascular: s1 s2 no ,m/r/g Respiratory: cta b abd soft nt nd no rebound Neuro intact  Discharge Instructions   Discharge Instructions    Call MD for:  temperature >100.4   Complete by: As directed    Diet - low sodium heart healthy   Complete by: As directed    Discharge instructions   Complete by: As directed    Discharging you on 3 weeks of both aspirin and plavix Make sure that you discontinue the plavix after 3 weeks as directed We will arrange outpatient management of your disease process with home health as best as we can  Lets ensure you get labs ~ 1 week or so   Increase activity slowly   Complete by: As directed      Allergies as of 03/31/2019   No Known  Allergies     Medication List    STOP taking these medications   Calcium 600+D 600-400 MG-UNIT tablet Generic drug: Calcium Carbonate-Vitamin D   diltiazem 300 MG 24 hr capsule Commonly known as: CARDIZEM CD   feeding supplement (ENSURE ENLIVE)  Liqd   potassium chloride SA 20 MEQ tablet Commonly known as: K-DUR   Rytary 23.75-95 MG Cpcr Generic drug: Carbidopa-Levodopa ER Replaced by: carbidopa-levodopa 25-100 MG tablet     TAKE these medications   aspirin 81 MG chewable tablet Chew 1 tablet (81 mg total) by mouth daily. Start taking on: April 01, 2019   carbidopa-levodopa 25-100 MG tablet Commonly known as: SINEMET IR Take 1 tablet by mouth 3 (three) times daily. Replaces: Rytary 23.75-95 MG Cpcr   cloNIDine 0.2 MG tablet Commonly known as: CATAPRES Take 0.2 mg by mouth 4 (four) times daily.   clopidogrel 75 MG tablet Commonly known as: PLAVIX Take 1 tablet (75 mg total) by mouth daily. Start taking on: April 01, 2019   fludrocortisone 0.1 MG tablet Commonly known as: FLORINEF Take 2 tablets (0.2 mg total) by mouth at bedtime.   ranibizumab 0.5 MG/0.05ML Soln Commonly known as: LUCENTIS 0.5 mg by Intravitreal route every 8 (eight) weeks. For macular degeneration   rosuvastatin 40 MG tablet Commonly known as: CRESTOR Take 1 tablet (40 mg total) by mouth daily at 6 PM.   thiamine 100 MG tablet Commonly known as: VITAMIN B-1 Take 100 mg by mouth at bedtime.      No Known Allergies    The results of significant diagnostics from this hospitalization (including imaging, microbiology, ancillary and laboratory) are listed below for reference.    Significant Diagnostic Studies: Ct Angio Head W Or Wo Contrast  Result Date: 03/29/2019 CLINICAL DATA:  Stroke follow-up. Acute and subacute right cerebral hemispheric infarcts on MRI, most notable in the basal ganglia. EXAM: CT ANGIOGRAPHY HEAD AND NECK TECHNIQUE: Multidetector CT imaging of the head and neck was performed using the standard protocol during bolus administration of intravenous contrast. Multiplanar CT image reconstructions and MIPs were obtained to evaluate the vascular anatomy. Carotid stenosis measurements (when applicable) are obtained utilizing  NASCET criteria, using the distal internal carotid diameter as the denominator. CONTRAST:  13mL OMNIPAQUE IOHEXOL 350 MG/ML SOLN COMPARISON:  Brain MRI 03/29/2019.  No prior angiographic imaging. FINDINGS: CT HEAD FINDINGS Brain: No acute intracranial hemorrhage, mass, midline shift, or extra-axial fluid collection is identified. Rounded hypodensity at the genu of the right internal capsule corresponds to the acute infarct on MRI. Multiple chronic infarcts are present in the basal ganglia bilaterally. Cerebral white matter hypodensities elsewhere bilaterally are nonspecific but compatible with moderate chronic small vessel ischemic disease. There is mild cerebral atrophy. Vascular: No hyperdense vessel. Skull: No fracture or focal osseous lesion. Sinuses: Paranasal sinuses and mastoid air cells are clear. Orbits: Bilateral cataract extraction. Review of the MIP images confirms the above findings CTA NECK FINDINGS Aortic arch: Incomplete imaging of the aortic arch with exclusion of the brachiocephalic and left common carotid artery origins. Moderate calcified atherosclerosis in the visualized arch without significant left subclavian artery origin stenosis. Right carotid system: Patent with moderately extensive, predominantly calcified plaque throughout the common carotid artery not resulting in significant stenosis. Prominent calcified plaque in the proximal ICA results in 50% stenosis. Left carotid system: Patent with scattered, predominantly calcified plaque in the common carotid artery not resulting in stenosis. Calcified plaque in the proximal ICA results in a focal high-grade stenosis estimated at 85%.  The more distal cervical ICA is widely patent and of normal caliber. Vertebral arteries: The vertebral arteries are patent with the left being strongly dominant. No significant stenosis or evidence of dissection. Skeleton: Advanced cervical disc and facet degeneration. Interbody and facet ankylosis at C4-5. Mild  multilevel degenerative listhesis. Mild anterior compression deformities of the T1 and T2 vertebral bodies, also present on a 01/21/2019 cervical spine MRI. Other neck: No evidence of cervical lymphadenopathy or mass. Upper chest: Mild emphysematous changes in the lung apices. Review of the MIP images confirms the above findings CTA HEAD FINDINGS Anterior circulation: The internal carotid arteries are patent from skull base to carotid termini with mild atherosclerotic plaque bilaterally not resulting in significant stenosis. ACAs and MCAs are patent with mild branch vessel irregularity but no evidence of proximal branch occlusion or significant proximal stenosis. No aneurysm is identified. Posterior circulation: The intracranial vertebral arteries are patent to the basilar with the left being strongly dominant. Patent AICA and SCA origins are visualized bilaterally. The basilar artery is widely patent. There are left larger than right posterior communicating arteries. Both PCAs are patent with mild branch vessel irregularity but no evidence of significant proximal stenosis. No aneurysm is identified. Venous sinuses: Patent. Anatomic variants: None. Review of the MIP images confirms the above findings IMPRESSION: 1. High-grade proximal left ICA stenosis due to calcified plaque, estimated at 85%. 2. 50% proximal right ICA stenosis. 3. Patent vertebral arteries with the left being strongly dominant. No significant stenosis. 4. Mild intracranial atherosclerosis without major branch occlusion or significant proximal stenosis. 5.  Aortic Atherosclerosis (ICD10-I70.0). Electronically Signed   By: Logan Bores M.D.   On: 03/29/2019 16:25   Ct Angio Neck W Or Wo Contrast  Result Date: 03/29/2019 CLINICAL DATA:  Stroke follow-up. Acute and subacute right cerebral hemispheric infarcts on MRI, most notable in the basal ganglia. EXAM: CT ANGIOGRAPHY HEAD AND NECK TECHNIQUE: Multidetector CT imaging of the head and neck was  performed using the standard protocol during bolus administration of intravenous contrast. Multiplanar CT image reconstructions and MIPs were obtained to evaluate the vascular anatomy. Carotid stenosis measurements (when applicable) are obtained utilizing NASCET criteria, using the distal internal carotid diameter as the denominator. CONTRAST:  152mL OMNIPAQUE IOHEXOL 350 MG/ML SOLN COMPARISON:  Brain MRI 03/29/2019.  No prior angiographic imaging. FINDINGS: CT HEAD FINDINGS Brain: No acute intracranial hemorrhage, mass, midline shift, or extra-axial fluid collection is identified. Rounded hypodensity at the genu of the right internal capsule corresponds to the acute infarct on MRI. Multiple chronic infarcts are present in the basal ganglia bilaterally. Cerebral white matter hypodensities elsewhere bilaterally are nonspecific but compatible with moderate chronic small vessel ischemic disease. There is mild cerebral atrophy. Vascular: No hyperdense vessel. Skull: No fracture or focal osseous lesion. Sinuses: Paranasal sinuses and mastoid air cells are clear. Orbits: Bilateral cataract extraction. Review of the MIP images confirms the above findings CTA NECK FINDINGS Aortic arch: Incomplete imaging of the aortic arch with exclusion of the brachiocephalic and left common carotid artery origins. Moderate calcified atherosclerosis in the visualized arch without significant left subclavian artery origin stenosis. Right carotid system: Patent with moderately extensive, predominantly calcified plaque throughout the common carotid artery not resulting in significant stenosis. Prominent calcified plaque in the proximal ICA results in 50% stenosis. Left carotid system: Patent with scattered, predominantly calcified plaque in the common carotid artery not resulting in stenosis. Calcified plaque in the proximal ICA results in a focal high-grade stenosis estimated at 85%. The more  distal cervical ICA is widely patent and of  normal caliber. Vertebral arteries: The vertebral arteries are patent with the left being strongly dominant. No significant stenosis or evidence of dissection. Skeleton: Advanced cervical disc and facet degeneration. Interbody and facet ankylosis at C4-5. Mild multilevel degenerative listhesis. Mild anterior compression deformities of the T1 and T2 vertebral bodies, also present on a 01/21/2019 cervical spine MRI. Other neck: No evidence of cervical lymphadenopathy or mass. Upper chest: Mild emphysematous changes in the lung apices. Review of the MIP images confirms the above findings CTA HEAD FINDINGS Anterior circulation: The internal carotid arteries are patent from skull base to carotid termini with mild atherosclerotic plaque bilaterally not resulting in significant stenosis. ACAs and MCAs are patent with mild branch vessel irregularity but no evidence of proximal branch occlusion or significant proximal stenosis. No aneurysm is identified. Posterior circulation: The intracranial vertebral arteries are patent to the basilar with the left being strongly dominant. Patent AICA and SCA origins are visualized bilaterally. The basilar artery is widely patent. There are left larger than right posterior communicating arteries. Both PCAs are patent with mild branch vessel irregularity but no evidence of significant proximal stenosis. No aneurysm is identified. Venous sinuses: Patent. Anatomic variants: None. Review of the MIP images confirms the above findings IMPRESSION: 1. High-grade proximal left ICA stenosis due to calcified plaque, estimated at 85%. 2. 50% proximal right ICA stenosis. 3. Patent vertebral arteries with the left being strongly dominant. No significant stenosis. 4. Mild intracranial atherosclerosis without major branch occlusion or significant proximal stenosis. 5.  Aortic Atherosclerosis (ICD10-I70.0). Electronically Signed   By: Logan Bores M.D.   On: 03/29/2019 16:25   Mr Brain Wo  Contrast  Result Date: 03/29/2019 CLINICAL DATA:  Initial evaluation for possible syncope versus seizure, presumed history of Parkinson's disease. EXAM: MRI HEAD WITHOUT CONTRAST TECHNIQUE: Multiplanar, multiecho pulse sequences of the brain and surrounding structures were obtained without intravenous contrast. COMPARISON:  None available. FINDINGS: Brain: Diffuse prominence of the CSF containing spaces compatible with generalized age-related cerebral atrophy. Patchy and confluent T2/FLAIR hyperintensity within the periventricular and deep white matter both cerebral hemispheres most consistent with chronic microvascular ischemic disease, moderate in nature. Scattered superimposed remote lacunar infarcts present within the bilateral basal ganglia. Associated chronic hemosiderin staining about several of these infarcts. 13 mm focus of restricted diffusion seen involving the right lentiform nucleus/genu of the internal capsule (series 5, image 72). Additional patchy involvement of the adjacent right caudate, external capsule, and subinsular white matter (series 5, image 70). Probable mild petechial hemorrhage at the level of the internal capsule (series 14, image 32). No significant mass effect. 13 mm focus of vague diffusion abnormality involving the right posterior periatrial white matter without ADC correlate likely reflects the sequelae of subacute small vessel ischemia (series 5, image 72). Additional similar focus seen involving the high subcortical right frontal lobe (series 5, image 83). No other evidence for acute or subacute ischemia. Gray-white matter differentiation otherwise maintained. No other areas of remote cortical infarction. No other evidence for acute or chronic intracranial hemorrhage. No mass lesion, midline shift or mass effect. Mild diffuse ventricular prominence related to global parenchymal volume loss without hydrocephalus. No extra-axial fluid collection. Pituitary gland suprasellar  region normal. Midline structures intact and normal. No intrinsic temporal lobe abnormality. Evaluation of the pars compacta limited on this non high-resolution exam. Vascular: Major intracranial vascular flow voids are maintained. Skull and upper cervical spine: Craniocervical junction within normal limits. Exaggeration of the normal  cervical lordosis noted. Bone marrow signal intensity within normal limits. 14 mm well-circumscribed T2 hyperintense cystic lesion within the subcutaneous fat of the suboccipital scalp noted, likely a sebaceous cyst. Scalp soft tissues demonstrate no acute finding. Sinuses/Orbits: Patient status post bilateral ocular lens replacement. Globes and orbital soft tissues demonstrate no acute finding. Paranasal sinuses are largely clear. Trace opacity bilateral mastoid air cells, of doubtful significance. Inner ear structures grossly normal. Other: None. IMPRESSION: 1. Patchy small volume acute ischemic infarcts involving the right basal ganglia, with extension into the right external capsule/sub insular white matter as above. Associated mild petechial hemorrhage without hemorrhagic transformation or significant mass effect. 2. Additional patchy small volume late subacute small vessel ischemic changes involving the right periatrial and right frontal subcortical white matter. 3. Underlying age-related cerebral atrophy with moderate chronic microvascular ischemic disease, with multiple remote lacunar infarcts involving the basal ganglia bilaterally. Electronically Signed   By: Jeannine Boga M.D.   On: 03/29/2019 04:08   Ir Vertebroplasty Cerv/thor Bx Inc Uni/bil Inc/inject/imaging  Result Date: 03/26/2019 INDICATION: Severe pain in lumbar region due to compression fracture at T12. EXAM: VERTEBROPLASTY AT T12 MEDICATIONS: As antibiotic prophylaxis, 2 g Ancef IV was ordered pre-procedure and administered intravenously within 1 hour of incision. ANESTHESIA/SEDATION: Moderate  (conscious) sedation was employed during this procedure. A total of Versed 1 mg and Fentanyl 5 mcg was administered intravenously. Moderate Sedation Time: 24 minutes. The patient's level of consciousness and vital signs were monitored continuously by radiology nursing throughout the procedure under my direct supervision. FLUOROSCOPY TIME:  Fluoroscopy Time: 8 minutes 24 seconds (431 mGy) COMPLICATIONS: None immediate. TECHNIQUE: Informed written consent was obtained from the patient after a thorough discussion of the procedural risks, benefits and alternatives. All questions were addressed. Maximal Sterile Barrier Technique was utilized including caps, mask, sterile gowns, sterile gloves, sterile drape, hand hygiene and skin antiseptic. A timeout was performed prior to the initiation of the procedure. PROCEDURE: The patient was placed prone on the fluoroscopic table. Nasal oxygen was administered. Physiologic monitoring was performed throughout the duration of the procedure. The skin overlying the thoracolumbar region was prepped and draped in the usual sterile fashion. The T12 vertebral body was identified and the right pedicle was infiltrated with 0.25% Bupivacaine. This was then followed by the advancement of a 13-gauge Cook needle through the right pedicle into the posterior one-third at T12. A 16 gauge core biopsy needle was then advanced to the anterior 1/3 at T12. Using a 20 mL syringe, core biopsies were obtained. Three passes were made. The 13 gauge Cook spinal needle was then advanced to the distal 1/3 at T12. At this time, methylmethacrylate mixture was reconstituted. Under biplane intermittent fluoroscopy, the methylmethacrylate was then injected into the T12 vertebral body with filling of the vertebral body. No extravasation was noted into the disk spaces or posteriorly into the spinal canal. No epidural venous contamination was seen. The needle was then removed. Hemostasis was achieved at the skin  entry site. There were no acute complications. Patient tolerated the procedure well. The patient was observed for 3 hours and discharged in good condition. IMPRESSION: 1. Status post vertebral body augmentation for painful compression fracture at T12 using vertebroplasty technique. 2. Core biopsies obtained were sent for pathologic analysis. The results of the biopsy to be sent to the referring MD. The son was advised to check with Dr. Rudene Anda office regarding the results of the biopsy. The son expressed understanding and agreement with the above plan. Electronically Signed  By: Luanne Bras M.D.   On: 03/24/2019 13:14    Microbiology: Recent Results (from the past 240 hour(s))  Urine Culture     Status: Abnormal   Collection Time: 03/24/19 11:20 AM   Specimen: Urine, Clean Catch  Result Value Ref Range Status   Specimen Description URINE, CLEAN CATCH  Final   Special Requests   Final    NONE Performed at [facility], [address], [place], [place] [address]    Culture MULTIPLE SPECIES PRESENT, SUGGEST RECOLLECTION (A)  Final   Report Status 03/25/2019 FINAL  Final  SARS Coronavirus 2 (CEPHEID - Performed in [place] hospital lab), Hosp Order     Status: None   Collection Time: 03/28/19  3:23 PM   Specimen: Nasopharyngeal Swab  Result Value Ref Range Status   SARS Coronavirus 2 NEGATIVE NEGATIVE Final    Comment: (NOTE) If result is NEGATIVE SARS-CoV-2 target nucleic acids are NOT DETECTED. The SARS-CoV-2 RNA is generally detectable in upper and lower  respiratory specimens during the acute phase of infection. The lowest  concentration of SARS-CoV-2 viral copies this assay can detect is 250  copies / mL. A negative result does not preclude SARS-CoV-2 infection  and should not be used as the sole basis for treatment or other  patient management decisions.  A negative result may occur with  improper specimen collection / handling, submission of specimen other  than  nasopharyngeal swab, presence of viral mutation(s) within the  areas targeted by this assay, and inadequate number of viral copies  (<250 copies / mL). A negative result must be combined with clinical  observations, patient history, and epidemiological information. If result is POSITIVE SARS-CoV-2 target nucleic acids are DETECTED. The SARS-CoV-2 RNA is generally detectable in upper and lower  respiratory specimens dur ing the acute phase of infection.  Positive  results are indicative of active infection with SARS-CoV-2.  Clinical  correlation with patient history and other diagnostic information is  necessary to determine patient infection status.  Positive results do  not rule out bacterial infection or co-infection with other viruses. If result is PRESUMPTIVE POSTIVE SARS-CoV-2 nucleic acids MAY BE PRESENT.   A presumptive positive result was obtained on the submitted specimen  and confirmed on repeat testing.  While 2019 novel coronavirus  (SARS-CoV-2) nucleic acids may be present in the submitted sample  additional confirmatory testing may be necessary for epidemiological  and / or clinical management purposes  to differentiate between  SARS-CoV-2 and other Sarbecovirus currently known to infect humans.  If clinically indicated additional testing with an alternate test  methodology [id number]) is advised. The SARS-CoV-2 RNA is generally  detectable in upper and lower respiratory sp ecimens during the acute  phase of infection. The expected result is Negative. Fact Sheet for Patients:  StrictlyIdeas.no Fact Sheet for Healthcare Providers: BankingDealers.co.za This test is not yet approved or cleared by the [place] FDA and has been authorized for detection and/or diagnosis of SARS-CoV-2 by FDA under an Emergency Use Authorization (EUA).  This EUA will remain in effect (meaning this test can be used) for the duration of  the COVID-19 declaration under Section 564(b)(1) of the Act, 21 U.S.C. section 360bbb-3(b)(1), unless the authorization is terminated or revoked sooner. Performed at [facility], [place] [address] [place], [place] [address]      Labs: Basic Metabolic Panel: Recent Labs  Lab 03/28/19 1344 03/29/19 0332 03/30/19 0432  NA 138 140 139  K  4.0 3.5 3.8  CL 104 104 107  CO2 25 28 26   GLUCOSE 107* 94 86  BUN 29* 22 21  CREATININE 1.58* 1.42* 1.19  CALCIUM 9.2 9.0 8.8*   Liver Function Tests: Recent Labs  Lab 03/30/19 0432  AST 16  ALT 5  ALKPHOS 68  BILITOT 0.5  PROT 5.5*  ALBUMIN 3.2*   No results for input(s): LIPASE, AMYLASE in the last 168 hours. No results for input(s): AMMONIA in the last 168 hours. CBC: Recent Labs  Lab 03/28/19 1344 03/30/19 0432  WBC 5.7 5.8  NEUTROABS  --  3.7  HGB 10.8* 11.1*  HCT 34.0* 34.0*  MCV 92.9 91.9  PLT 164 158   Cardiac Enzymes: No results for input(s): CKTOTAL, CKMB, CKMBINDEX, TROPONINI in the last 168 hours. BNP: BNP (last 3 results) No results for input(s): BNP in the last 8760 hours.  ProBNP (last 3 results) No results for input(s): PROBNP in the last 8760 hours.  CBG: No results for input(s): GLUCAP in the last 168 hours.     Signed:  Nita Sells MD   [facility] 03/31/2019, 2:03 PM

## 2019-03-31 NOTE — Evaluation (Signed)
Clinical/Bedside Swallow Evaluation Patient Details  Name: Jose Baldwin MRN: [id number] Date of Birth: 1928-11-30  Today's Date: 03/31/2019 Time: SLP Start Time (ACUTE ONLY): 1350 SLP Stop Time (ACUTE ONLY): 1409 SLP Time Calculation (min) (ACUTE ONLY): 19 min  Past Medical History:  Past Medical History:  Diagnosis Date  . Hypertension   . Prostate cancer ([place])   . Syncope    Past Surgical History:  Past Surgical History:  Procedure Laterality Date  . BACK SURGERY    . IR VERTEBROPLASTY CERV/THOR BX INC UNI/BIL INC/INJECT/IMAGING  01/31/2018  . IR VERTEBROPLASTY CERV/THOR BX INC UNI/BIL INC/INJECT/IMAGING  03/24/2019   HPI:  85 year old Caucasian male-prior syncope lightheadedness since 09/2017, chronic LBBB, known carotid bruit, prostate cancer in the past-apparently 15 years of shuffling gait rigidity without formal diagnosis Parkinson's-[facility] Rytary to see if he would benefit. He did great but is no longer able to get medication. Admitted for syncope of unknown origin 17/18/20. Neurology is recommending titration to Sinemet. MRI showed scattered acute and subacute infarcts. Pt was initially started on a diet but exhibited difficulty with solids. MD therefore downgraded to purees and ordered SLP swallow evaluation.   Assessment / Plan / Recommendation Clinical Impression  Pt presents with mild signs of dysphagia, appearing consistent with h/o Parkinson's disease. He has intermittent oral holding, prolonged mastication, and at times anterior spillage of thin liquids if he does not clear them completely during the swallow. Solids appear to be consistently cleared from his oral cavity well, but mastication of soft solids is somewhat labored. No overt signs of aspiration were noted with challenging to include larger boluses, although pt would not drink sequentially. Recommend Dys 2 diet for ease of oral mastication and transit, continuing thin  liquids. He would benefit from SLP f/u post-discharge. Per MD, who was present for eval, plan is for discharge today, therefore will defer follow up to his next setting.  SLP Visit Diagnosis: Dysphagia, unspecified (R13.10)    Aspiration Risk  Mild aspiration risk    Diet Recommendation Dysphagia 2 (Fine chop);Thin liquid   Liquid Administration via: Cup;Straw Medication Administration: Crushed with puree Supervision: Patient able to self feed;Intermittent supervision to cue for compensatory strategies Compensations: Slow rate;Small sips/bites Postural Changes: Seated upright at 90 degrees    Other  Recommendations Oral Care Recommendations: Oral care BID   Follow up Recommendations Home health SLP;24 hour supervision/assistance      Frequency and Duration            Prognosis Prognosis for Safe Diet Advancement: Good Barriers to Reach Goals: Cognitive deficits      Swallow Study   General HPI: 85 year old Caucasian male-prior syncope lightheadedness since 09/2017, chronic LBBB, known carotid bruit, prostate cancer in the past-apparently 15 years of shuffling gait rigidity without formal diagnosis Parkinson's-[facility] medication Rytary to see if he would benefit. He did great but is no longer able to get medication. Admitted for syncope of unknown origin 17/18/20. Neurology is recommending titration to Sinemet. MRI showed scattered acute and subacute infarcts. Type of Study: Bedside Swallow Evaluation Previous Swallow Assessment: none in chart Diet Prior to this Study: Dysphagia 1 (puree);Thin liquids Temperature Spikes Noted: No Respiratory Status: Room air History of Recent Intubation: No Behavior/Cognition: Alert;Cooperative;Pleasant mood;Requires cueing Oral Cavity Assessment: Within Functional Limits Oral Care Completed by SLP: No Oral Cavity - Dentition: Adequate natural dentition Vision: Functional for self-feeding Self-Feeding  Abilities: Able to feed  self Patient Positioning: Upright in bed Baseline Vocal Quality: Low vocal intensity Volitional Swallow: Able to elicit    Oral/Motor/Sensory Function Overall Oral Motor/Sensory Function: Generalized oral weakness(?subtle R facial droop)   Ice Chips Ice chips: Not tested   Thin Liquid Thin Liquid: Impaired Presentation: Cup;Self Fed;Straw Oral Phase Functional Implications: Other (comment);Oral holding(anterior spillage of residue) Pharyngeal  Phase Impairments: Multiple swallows    Nectar Thick Nectar Thick Liquid: Not tested   Honey Thick Honey Thick Liquid: Not tested   Puree Puree: Within functional limits Presentation: Self Fed;Spoon   Solid     Solid: Impaired Presentation: Self Fed Oral Phase Functional Implications: Impaired mastication      Jose Baldwin 03/31/2019,2:46 PM  Pollyann Glen, M.A. [place] Acute Environmental education officer [phone] Office [phone]

## 2019-03-31 NOTE — Progress Notes (Signed)
Occupational Therapy Treatment Patient Details Name: Jose Baldwin MRN: [id number] DOB: 17-Jan-1929 Today's Date: 03/31/2019    History of present illness 85 year old Caucasian male-prior syncope lightheadedness since 09/2017, chronic LBBB, known carotid bruit, prostate cancer in the past-apparently 15 years of shuffling gait rigidity without formal diagnosis Parkinson's-[facility] medication Rytary to see if he would benefit. He did great but is no longer able to get medication. Admitted for syncope of unknown origin 17/18/20 Neurology is recommending titration to Sinemet.    OT comments  Patient agreeable to OT, although reports fatigued.  Completes standing grooming tasks at sink with min guard while standing, and overall min assist (to assist with combing hair).  Patient reports he now plans to dc home (as seen by CM note from this am as well), but highly recommend 24/7 support at discharge.    Follow Up Recommendations  SNF;Home health OT;Supervision/Assistance - 24 hour    Equipment Recommendations  None recommended by OT    Recommendations for Other Services      Precautions / Restrictions Precautions Precautions: Fall Precaution Comments: recent kyphoplasty. spinal        Mobility Bed Mobility               General bed mobility comments: OOB in recliner upon entry  Transfers Overall transfer level: Needs assistance   Transfers: Sit to/from Stand Sit to Stand: Min guard         General transfer comment: min guard for safety and balance, increased time and effort    Balance Overall balance assessment: Needs assistance Sitting-balance support: No upper extremity supported;Feet supported Sitting balance-Leahy Scale: Fair     Standing balance support: Single extremity supported;No upper extremity supported;During functional activity Standing balance-Leahy Scale: Poor Standing balance comment: patient able to engage in grooming  tasks with min guard with 0-1 hand support at sink                           ADL either performed or assessed with clinical judgement   ADL Overall ADL's : Needs assistance/impaired     Grooming: Minimal assistance;Wash/dry hands;Wash/dry face;Oral care;Brushing hair;Sitting;Standing Grooming Details (indicate cue type and reason): initated tasks standing with min guard for safety, completing final task (brushing hair) seated due to fatigue                             Functional mobility during ADLs: Min guard General ADL Comments: pt engaged in ADLs standing at sink with min guard     Vision       Perception     Praxis      Cognition Arousal/Alertness: Awake/alert Behavior During Therapy: WFL for tasks assessed/performed Overall Cognitive Status: History of cognitive impairments - at baseline                                          Exercises     Shoulder Instructions       General Comments VSS    Pertinent Vitals/ Pain       Pain Assessment: Faces Faces Pain Scale: No hurt  Home Living  Prior Functioning/Environment              Frequency  Min 2X/week        Progress Toward Goals  OT Goals(current goals can now be found in the care plan section)  Progress towards OT goals: Progressing toward goals  Acute Rehab OT Goals Patient Stated Goal: go home OT Goal Formulation: With patient  Plan Discharge plan remains appropriate;Frequency remains appropriate    Co-evaluation                 AM-PAC OT "6 Clicks" Daily Activity     Outcome Measure   Help from another person eating meals?: None Help from another person taking care of personal grooming?: A Little Help from another person toileting, which includes using toliet, bedpan, or urinal?: A Little Help from another person bathing (including washing, rinsing, drying)?: A Lot Help from  another person to put on and taking off regular upper body clothing?: A Little Help from another person to put on and taking off regular lower body clothing?: A Lot 6 Click Score: 17    End of Session    OT Visit Diagnosis: Unsteadiness on feet (R26.81);Repeated falls (R29.6);Muscle weakness (generalized) (M62.81);History of falling (Z91.81)   Activity Tolerance Patient tolerated treatment well   Patient Left in chair;with call bell/phone within reach;with chair alarm set   Nurse Communication          Time: 1024-1050 OT Time Calculation (min): 26 min  Charges: OT General Charges $OT Visit: 1 Visit OT Treatments $Self Care/Home Management : 23-37 mins  Delight Stare, [place] Pager [phone] Office [phone]    Delight Stare 03/31/2019, 1:09 PM

## 2019-03-31 NOTE — Progress Notes (Signed)
Physical Therapy Treatment Patient Details Name: Jose Baldwin MRN: [id number] DOB: August 05, 1929 Today's Date: 03/31/2019    History of Present Illness 85 year old Caucasian male-prior syncope lightheadedness since 09/2017, chronic LBBB, known carotid bruit, prostate cancer, shuffling gait rigidity without formal diagnosis Parkinson's-[facility] Rytary to see if he would benefit. He did great but is no longer able to get medication. Admitted for syncope of unknown origin 03/28/19 Neurology is recommending titration to Sinemet.    PT Comments    Pt with flat affect but improved mobility this session. Pt able to walk in hall with Rw with flexed trunk and shuffling gait. Pt able to perform bil LE HEP with decreased knee extension and perform repeated sit to stands. Pt continues to require assist with standing from surface without armrests as well as supervision for safety which he does not have at home with SNF or 24hr supervision still the recommendation. Noted pt with plans to return home and would certainly maximize [facility] services to increase assist available at home.  HR 82    Follow Up Recommendations  SNF;Supervision/Assistance - 24 hour;Home health PT(pt now declining SNF for [facility])     Equipment Recommendations  None recommended by PT    Recommendations for Other Services       Precautions / Restrictions Precautions Precautions: Fall Precaution Comments: recent kyphoplasty    Mobility  Bed Mobility Overal bed mobility: Needs Assistance Bed Mobility: Supine to Sit     Supine to sit: Min guard     General bed mobility comments: pt able to transition to EOb without physical assist  Transfers Overall transfer level: Needs assistance   Transfers: Sit to/from Stand Sit to Stand: Min guard;Min assist         General transfer comment: guarding for safety with cues for hand placement. PT required min assist to rise from bed but able to  perform repeated sit<>stand x 5 at chair with minguard with increased time  Ambulation/Gait Ambulation/Gait assistance: Min guard Gait Distance (Feet): 250 Feet Assistive device: Rolling walker (2 wheeled) Gait Pattern/deviations: Shuffle;Trunk flexed;Narrow base of support   Gait velocity interpretation: 1.31 - 2.62 ft/sec, indicative of limited community ambulator General Gait Details: pt with significant kyphosis in standing with reliance on RW for gait. Mod cues for posture and proximity to RW as pt maintains self too posterior to AD. pt will not provide feedback for how he is feeling during gait   Stairs             Wheelchair Mobility    Modified Rankin (Stroke Patients Only)       Balance Overall balance assessment: Needs assistance Sitting-balance support: No upper extremity supported;Feet supported Sitting balance-Leahy Scale: Fair     Standing balance support: Bilateral upper extremity supported Standing balance-Leahy Scale: Poor Standing balance comment: bil UE support for gait                            Cognition Arousal/Alertness: Awake/alert Behavior During Therapy: Flat affect Overall Cognitive Status: No family/caregiver present to determine baseline cognitive functioning                                 General Comments: pt with flat affect, awake, very limited verbalization throughout session despite questions asked of him. pt following most single step commands other than RW use  Exercises General Exercises - Lower Extremity Long Arc Quad: AROM;15 reps;Both;Seated Hip Flexion/Marching: AROM;15 reps;Both;Seated Other Exercises Other Exercises: repeated sit to stand x 5 with RW present and use of hands on armrests    General Comments General comments (skin integrity, edema, etc.): VSS      Pertinent Vitals/Pain Pain Assessment: No/denies pain Faces Pain Scale: No hurt    Home Living                       Prior Function            PT Goals (current goals can now be found in the care plan section) Acute Rehab PT Goals Patient Stated Goal: go home Progress towards PT goals: Progressing toward goals    Frequency    Min 3X/week      PT Plan Current plan remains appropriate    Co-evaluation              AM-PAC PT "6 Clicks" Mobility   Outcome Measure  Help needed turning from your back to your side while in a flat bed without using bedrails?: A Little Help needed moving from lying on your back to sitting on the side of a flat bed without using bedrails?: A Little Help needed moving to and from a bed to a chair (including a wheelchair)?: A Little Help needed standing up from a chair using your arms (e.g., wheelchair or bedside chair)?: A Little Help needed to walk in hospital room?: A Little Help needed climbing 3-5 steps with a railing? : A Little 6 Click Score: 18    End of Session Equipment Utilized During Treatment: Gait belt Activity Tolerance: Patient tolerated treatment well Patient left: in chair;with call bell/phone within reach;with chair alarm set Nurse Communication: Mobility status PT Visit Diagnosis: Other abnormalities of gait and mobility (R26.89);Muscle weakness (generalized) (M62.81);Other symptoms and signs involving the nervous system (U63.335)     Time: 4562-5638 PT Time Calculation (min) (ACUTE ONLY): 18 min  Charges:  $Gait Training: 8-22 mins                     [place], PT Acute Rehabilitation Services Pager: [phone] Office: [phone]    Sandy Salaam Merica Prell 03/31/2019, 1:41 PM

## 2019-04-01 NOTE — TOC Transition Note (Signed)
Transition of Care [facility]) - CM/SW Discharge Note   Patient Details  Name: Jose Baldwin MRN: [id number] Date of Birth: 06-19-1929  [facility] CM/SW Contact:  Sharin Mons, RN Phone Number: 04/01/2019, 11:43 AM   Clinical Narrative:     Transition to home today with home health services, [facility] within 48hrs. Son to provide transportation to home.  Final next level of care: [place] Barriers to Discharge: No Barriers Identified   Patient Goals and CMS Choice Patient states their goals for this hospitalization and ongoing recovery are:: "To get well so I can go home." CMS Medicare.gov Compare Post Acute Care list provided to:: Patient    Discharge Placement                       Discharge Plan and Services     Post Acute Care Choice: Home Health          DME Arranged: N/A         HH Arranged: RN, PT, OT, Nurse's Aide, Speech Therapy, Social Work    Music therapist        Social Determinants of Health (SDOH) Interventions     Readmission Risk Interventions No flowsheet data found.

## 2019-04-01 NOTE — Progress Notes (Signed)
Patient discharged to home with all his belongings.  Son picked patient up at the main entrance.  Marcille Blanco, RN

## 2019-04-01 NOTE — Progress Notes (Signed)
Patient seen and examined and evaluated this morning he is sitting up in bed he is eaten a good meal according to the nurse tech and has ambulated with therapy He is stable for discharge to skilled facility whenever they can take him  No changes to discharge summary from 7/21

## 2019-04-03 ENCOUNTER — Telehealth: Payer: Self-pay | Admitting: *Deleted

## 2019-04-03 ENCOUNTER — Encounter: Payer: Self-pay | Admitting: Family Medicine

## 2019-04-03 NOTE — Telephone Encounter (Signed)
Copied from [place] [id number]. Topic: General - Inquiry >> Apr 03, 2019  9:58 AM Virl Axe D wrote: Reason for CRM: Wilburn Cornelia with [facility] called to confirm that Dr. Volanda Napoleon would be in agreeance for pt to receive services. Requesting callback [phone]

## 2019-04-03 NOTE — Telephone Encounter (Signed)
ok 

## 2019-04-06 NOTE — Telephone Encounter (Signed)
Called [place]. No answer. LVM for the okay for pt to receive services

## 2019-04-07 ENCOUNTER — Encounter: Payer: Self-pay | Admitting: Family Medicine

## 2019-04-07 ENCOUNTER — Other Ambulatory Visit: Payer: Self-pay

## 2019-04-07 ENCOUNTER — Ambulatory Visit (INDEPENDENT_AMBULATORY_CARE_PROVIDER_SITE_OTHER): Payer: Medicare Other | Admitting: Family Medicine

## 2019-04-07 DIAGNOSIS — D508 Other iron deficiency anemias: Secondary | ICD-10-CM

## 2019-04-07 DIAGNOSIS — G20 Parkinson's disease: Secondary | ICD-10-CM

## 2019-04-07 DIAGNOSIS — I6340 Cerebral infarction due to embolism of unspecified cerebral artery: Secondary | ICD-10-CM | POA: Diagnosis not present

## 2019-04-07 DIAGNOSIS — L219 Seborrheic dermatitis, unspecified: Secondary | ICD-10-CM

## 2019-04-07 MED ORDER — KETOCONAZOLE 2 % EX CREA: 1.0000 "application " | TOPICAL_CREAM | Freq: Every day | CUTANEOUS | 6 refills | Status: DC

## 2019-04-07 NOTE — Progress Notes (Signed)
Virtual Visit via Video Note  I connected with Jose Baldwin on 04/07/19 at  1:00 PM EDT by a video enabled telemedicine application and verified that I am speaking with the correct person using two identifiers.  Location patient: home Location provider:work or home office Persons participating in the virtual visit: patient, provider, pt's son  I discussed the limitations of evaluation and management by telemedicine and the availability of in person appointments. The patient expressed understanding and agreed to proceed.   HPI: Pt admitted to the hospital on 7/18-7/21/20 for syncope with possible seizure like activity, Parkinson's, malnutrition, iron deficiency anemia, and CVA.  EEG nonspecific, seizure like activity thought 2/2 dysautonomia from Parkinson's dz.  MRI with new acute and subacute strokes.  Pt started on ASA and plavix for 3 wks, then to continue ASA alone.  Cardizem was d/c'd 2/2 possible tachy-brady syndrome.  Mild thrombocytopenia and iron def anemia noted.  Pt given IV iron.  Pt was not on iron outpt 2/2 Rytary use.  Fluids given for volume depletion which improved creatinine.  Pt states he feels good since being home.  Per pt's son, pt is also seen by the [place].  Pt was started on Rytary prior to this hospitalization as part of a trial.  Per son, Rytary 23.75-95 2 pills TID worked better than sinemet 25-100 TID.  Pt had more energy on Rytary.  Pt moves and talks slower on the sinsemet.  Pt's son doubled the sinsemet dose today so the pt would be able to speak with this provider.  Per son otherwise pt sleeps all day.  Pt having some confusion.  Had a fall after waking up at 3 am.  He fell in the kitchen hitting his elbow which caused a laceration. Pt was on the floor until 7 am.   [facility] came by for an initial visit and is supposed to start at the end of this wk.  Pt has upcoming Neuro appts.  [facility] appt is 8/10 and there is a [facility] appt scheduled some time after  that.  Per son pt with Seborrheic dermatitis.  Pt has dry flaking skin around face and scalp.  Pt's son has been using his own medication on his father which seems to help.    ROS: See pertinent positives and negatives per HPI.  Past Medical History:  Diagnosis Date  . Hypertension   . Prostate cancer ([place])   . Syncope     Past Surgical History:  Procedure Laterality Date  . BACK SURGERY    . IR VERTEBROPLASTY CERV/THOR BX INC UNI/BIL INC/INJECT/IMAGING  01/31/2018  . IR VERTEBROPLASTY CERV/THOR BX INC UNI/BIL INC/INJECT/IMAGING  03/24/2019    Family History  Problem Relation Age of Onset  . Heart attack Father 75  Social hx: pt was an Chief Financial Officer.   Current Outpatient Medications:  .  aspirin 81 MG chewable tablet, Chew 1 tablet (81 mg total) by mouth daily., Disp: 30 tablet, Rfl: 0 .  carbidopa-levodopa (SINEMET IR) 25-100 MG tablet, Take 1 tablet by mouth 3 (three) times daily., Disp: 90 tablet, Rfl: 2 .  carbidopa-levodopa (SINEMET) 25-100 MG tablet, Take 1 tablet by mouth 3 (three) times daily., Disp: 21 tablet, Rfl: 0 .  cloNIDine (CATAPRES) 0.2 MG tablet, Take 0.2 mg by mouth 4 (four) times daily. , Disp: , Rfl:  .  clopidogrel (PLAVIX) 75 MG tablet, Take 1 tablet (75 mg total) by mouth daily., Disp: 21 tablet, Rfl: 0 .  clopidogrel (PLAVIX) 75  MG tablet, Take 1 tablet (75 mg total) by mouth daily for 7 days., Disp: 7 tablet, Rfl: 0 .  fludrocortisone (FLORINEF) 0.1 MG tablet, Take 2 tablets (0.2 mg total) by mouth at bedtime. (Patient not taking: Reported on 03/28/2019), Disp: 180 tablet, Rfl: 1 .  ranibizumab (LUCENTIS) 0.5 MG/0.05ML SOLN, 0.5 mg by Intravitreal route every 8 (eight) weeks. For macular degeneration, Disp: , Rfl:  .  rosuvastatin (CRESTOR) 40 MG tablet, Take 1 tablet (40 mg total) by mouth daily at 6 PM., Disp: 30 tablet, Rfl: 0 .  thiamine (VITAMIN B-1) 100 MG tablet, Take 100 mg by mouth at bedtime. , Disp: , Rfl:   EXAM:  VITALS per patient if applicable:  RR between 12-20 bpm.  GENERAL: alert, oriented, appears well and in no acute distress.  Sitting in chair, answers questions slowly. Mild confusion when asked what he was doing prior to hospitalization (stated was chopping wood).  HEENT: atraumatic, conjunctiva clear, no obvious abnormalities on inspection of external nose and ears  NECK: normal movements of the head and neck  LUNGS: on inspection no signs of respiratory distress, breathing rate appears normal, no obvious gross SOB, gasping or wheezing  CV: no obvious cyanosis  MS: moves all visible extremities without noticeable abnormality  PSYCH/NEURO: pleasant and cooperative, no obvious depression or anxiety, speech and thought processing grossly intact  ASSESSMENT AND PLAN:  Discussed the following assessment and plan:  Parkinson's disease ([place])  -hesitant to make drastic changes in meds at this time. -discussed possibly restarting Rytary -would like input from [facility] regarding this and pt's confusion. -advised against benzos 2/2 increased fall risk. -for now continue sinemet -discussed the importance of staying hydrated and overall nutrition  Seborrheic dermatitis - Plan: ketoconazole (NIZORAL) 2 % cream  Iron deficiency anemia secondary to inadequate dietary iron intake  -Discussed need for iron replacement. -discussed r/b/a to po iron supplementation including may make stools appear dark in color or cause constipation. -Pt's son would like to try po supplementation.  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 -OT,ST, PT to start this wk    F/u in 1 month   I discussed the assessment and treatment plan with the patient. The patient was provided an opportunity to ask questions and all were answered. The  patient agreed with the plan and demonstrated an understanding of the instructions.   The patient was advised to call back or seek an in-person evaluation if the symptoms worsen or if the condition fails to improve as anticipated.  I provided 31 minutes of non-face-to-face time during this encounter.   Billie Ruddy, MD

## 2019-04-16 ENCOUNTER — Telehealth: Payer: Self-pay | Admitting: Family Medicine

## 2019-04-16 NOTE — Telephone Encounter (Signed)
LVM for okay for verbal orders.

## 2019-04-16 NOTE — Telephone Encounter (Signed)
See note

## 2019-04-16 NOTE — Telephone Encounter (Signed)
Please advise 

## 2019-04-16 NOTE — Telephone Encounter (Signed)
ok 

## 2019-04-16 NOTE — Telephone Encounter (Signed)
Copied from [place] [id number]. Topic: Quick Communication - Home Health Verbal Orders >> Apr 16, 2019 10:08 AM Valla Leaver wrote: Caller/Agency: Sheryle Spray, OT, w/ [place] Number: [phone] Requesting OT/PT/Skilled Nursing/Social Work/Speech Therapy: OT Frequency: 1x a wk for 8wks

## 2019-04-20 ENCOUNTER — Inpatient Hospital Stay (HOSPITAL_COMMUNITY)
Admission: EM | Admit: 2019-04-20 | Discharge: 2019-04-23 | DRG: 640 | Disposition: A | Discharge status: Home Health | Payer: Medicare Other | Attending: Internal Medicine | Admitting: Internal Medicine

## 2019-04-20 ENCOUNTER — Observation Stay (HOSPITAL_COMMUNITY): Payer: Medicare Other

## 2019-04-20 ENCOUNTER — Encounter (HOSPITAL_COMMUNITY): Payer: Self-pay

## 2019-04-20 ENCOUNTER — Other Ambulatory Visit: Payer: Self-pay

## 2019-04-20 DIAGNOSIS — N179 Acute kidney failure, unspecified: Secondary | ICD-10-CM | POA: Diagnosis not present

## 2019-04-20 DIAGNOSIS — I498 Other specified cardiac arrhythmias: Secondary | ICD-10-CM | POA: Diagnosis present

## 2019-04-20 DIAGNOSIS — I499 Cardiac arrhythmia, unspecified: Secondary | ICD-10-CM | POA: Diagnosis not present

## 2019-04-20 DIAGNOSIS — E876 Hypokalemia: Principal | ICD-10-CM | POA: Diagnosis present

## 2019-04-20 DIAGNOSIS — Z87891 Personal history of nicotine dependence: Secondary | ICD-10-CM

## 2019-04-20 DIAGNOSIS — D509 Iron deficiency anemia, unspecified: Secondary | ICD-10-CM

## 2019-04-20 DIAGNOSIS — L219 Seborrheic dermatitis, unspecified: Secondary | ICD-10-CM

## 2019-04-20 DIAGNOSIS — Z7902 Long term (current) use of antithrombotics/antiplatelets: Secondary | ICD-10-CM

## 2019-04-20 DIAGNOSIS — N183 Chronic kidney disease, stage 3 unspecified: Secondary | ICD-10-CM | POA: Diagnosis present

## 2019-04-20 DIAGNOSIS — Z79899 Other long term (current) drug therapy: Secondary | ICD-10-CM

## 2019-04-20 DIAGNOSIS — Z8673 Personal history of transient ischemic attack (TIA), and cerebral infarction without residual deficits: Secondary | ICD-10-CM

## 2019-04-20 DIAGNOSIS — Z8546 Personal history of malignant neoplasm of prostate: Secondary | ICD-10-CM

## 2019-04-20 DIAGNOSIS — E861 Hypovolemia: Secondary | ICD-10-CM | POA: Diagnosis present

## 2019-04-20 DIAGNOSIS — G20 Parkinson's disease: Secondary | ICD-10-CM | POA: Diagnosis present

## 2019-04-20 DIAGNOSIS — I129 Hypertensive chronic kidney disease with stage 1 through stage 4 chronic kidney disease, or unspecified chronic kidney disease: Secondary | ICD-10-CM | POA: Diagnosis present

## 2019-04-20 DIAGNOSIS — Z20828 Contact with and (suspected) exposure to other viral communicable diseases: Secondary | ICD-10-CM | POA: Diagnosis present

## 2019-04-20 DIAGNOSIS — C61 Malignant neoplasm of prostate: Secondary | ICD-10-CM | POA: Diagnosis present

## 2019-04-20 DIAGNOSIS — E43 Unspecified severe protein-calorie malnutrition: Secondary | ICD-10-CM | POA: Diagnosis present

## 2019-04-20 DIAGNOSIS — Z8249 Family history of ischemic heart disease and other diseases of the circulatory system: Secondary | ICD-10-CM

## 2019-04-20 DIAGNOSIS — Z681 Body mass index (BMI) 19 or less, adult: Secondary | ICD-10-CM

## 2019-04-20 DIAGNOSIS — Z7982 Long term (current) use of aspirin: Secondary | ICD-10-CM

## 2019-04-20 DIAGNOSIS — Z66 Do not resuscitate: Secondary | ICD-10-CM | POA: Diagnosis present

## 2019-04-20 LAB — CBC WITH DIFFERENTIAL/PLATELET
Abs Immature Granulocytes: 0.03 10*3/uL (ref 0.00–0.07)
Basophils Absolute: 0 10*3/uL (ref 0.0–0.1)
Basophils Relative: 0 %
Eosinophils Absolute: 0.2 10*3/uL (ref 0.0–0.5)
Eosinophils Relative: 3 %
HCT: 29.9 % — ABNORMAL LOW (ref 39.0–52.0)
Hemoglobin: 9.6 g/dL — ABNORMAL LOW (ref 13.0–17.0)
Immature Granulocytes: 0 %
Lymphocytes Relative: 18 %
Lymphs Abs: 1.5 10*3/uL (ref 0.7–4.0)
MCH: 29.7 pg (ref 26.0–34.0)
MCHC: 32.1 g/dL (ref 30.0–36.0)
MCV: 92.6 fL (ref 80.0–100.0)
Monocytes Absolute: 0.7 10*3/uL (ref 0.1–1.0)
Monocytes Relative: 9 %
Neutro Abs: 6 10*3/uL (ref 1.7–7.7)
Neutrophils Relative %: 70 %
Platelets: 191 10*3/uL (ref 150–400)
RBC: 3.23 MIL/uL — ABNORMAL LOW (ref 4.22–5.81)
RDW: 15.1 % (ref 11.5–15.5)
WBC: 8.5 10*3/uL (ref 4.0–10.5)
nRBC: 0 % (ref 0.0–0.2)

## 2019-04-20 LAB — COMPREHENSIVE METABOLIC PANEL
ALT: 17 U/L (ref 0–44)
AST: 35 U/L (ref 15–41)
Albumin: 3.3 g/dL — ABNORMAL LOW (ref 3.5–5.0)
Alkaline Phosphatase: 80 U/L (ref 38–126)
Anion gap: 14 (ref 5–15)
BUN: 29 mg/dL — ABNORMAL HIGH (ref 8–23)
CO2: 30 mmol/L (ref 22–32)
Calcium: 8.3 mg/dL — ABNORMAL LOW (ref 8.9–10.3)
Chloride: 99 mmol/L (ref 98–111)
Creatinine, Ser: 2.17 mg/dL — ABNORMAL HIGH (ref 0.61–1.24)
GFR calc Af Amer: 30 mL/min — ABNORMAL LOW (ref >60)
GFR calc non Af Amer: 26 mL/min — ABNORMAL LOW (ref >60)
Glucose, Bld: 103 mg/dL — ABNORMAL HIGH (ref 70–99)
Potassium: 2.4 mmol/L — CL (ref 3.5–5.1)
Sodium: 143 mmol/L (ref 135–145)
Total Bilirubin: 0.5 mg/dL (ref 0.3–1.2)
Total Protein: 6.3 g/dL — ABNORMAL LOW (ref 6.5–8.1)

## 2019-04-20 LAB — MAGNESIUM: Magnesium: 1.6 mg/dL — ABNORMAL LOW (ref 1.7–2.4)

## 2019-04-20 MED ORDER — ROSUVASTATIN CALCIUM 20 MG PO TABS
40.0000 mg | ORAL_TABLET | Freq: Every day | ORAL | Status: DC
  Administered 2019-04-21 – 2019-04-22 (×3): 40 mg via ORAL
  Filled 2019-04-20 (×3): qty 2

## 2019-04-20 MED ORDER — POTASSIUM CHLORIDE CRYS ER 20 MEQ PO TBCR
40.0000 meq | EXTENDED_RELEASE_TABLET | Freq: Once | ORAL | Status: AC
  Administered 2019-04-20: 40 meq via ORAL
  Filled 2019-04-20: qty 2

## 2019-04-20 MED ORDER — SODIUM CHLORIDE 0.9% FLUSH
3.0000 mL | Freq: Two times a day (BID) | INTRAVENOUS | Status: DC
  Administered 2019-04-21 – 2019-04-23 (×4): 3 mL via INTRAVENOUS

## 2019-04-20 MED ORDER — SODIUM CHLORIDE 0.9 % IV BOLUS
1000.0000 mL | Freq: Once | INTRAVENOUS | Status: AC
  Administered 2019-04-20: 1000 mL via INTRAVENOUS

## 2019-04-20 MED ORDER — ACETAMINOPHEN 325 MG PO TABS
650.0000 mg | ORAL_TABLET | Freq: Four times a day (QID) | ORAL | Status: DC | PRN
  Filled 2019-04-20: qty 2

## 2019-04-20 MED ORDER — ONDANSETRON HCL 4 MG/2ML IJ SOLN: 4.0000 mg | Freq: Four times a day (QID) | INTRAMUSCULAR | Status: DC | PRN

## 2019-04-20 MED ORDER — ONDANSETRON HCL 4 MG PO TABS: 4.0000 mg | ORAL_TABLET | Freq: Four times a day (QID) | ORAL | Status: DC | PRN

## 2019-04-20 MED ORDER — CLOPIDOGREL BISULFATE 75 MG PO TABS: 75.0000 mg | ORAL_TABLET | Freq: Every day | ORAL | Status: DC

## 2019-04-20 MED ORDER — HYDROCODONE-ACETAMINOPHEN 5-325 MG PO TABS: 1.0000 | ORAL_TABLET | Freq: Four times a day (QID) | ORAL | Status: DC | PRN

## 2019-04-20 MED ORDER — FLUDROCORTISONE ACETATE 0.1 MG PO TABS
0.2000 mg | ORAL_TABLET | Freq: Every day | ORAL | Status: DC
  Administered 2019-04-21 – 2019-04-22 (×3): 0.2 mg via ORAL
  Filled 2019-04-20 (×4): qty 2

## 2019-04-20 MED ORDER — POTASSIUM CHLORIDE 10 MEQ/100ML IV SOLN
10.0000 meq | INTRAVENOUS | Status: AC
  Administered 2019-04-20: 10 meq via INTRAVENOUS
  Filled 2019-04-20: qty 100

## 2019-04-20 MED ORDER — CLONIDINE HCL 0.2 MG PO TABS
0.2000 mg | ORAL_TABLET | Freq: Three times a day (TID) | ORAL | Status: DC
  Administered 2019-04-21 – 2019-04-23 (×9): 0.2 mg via ORAL
  Filled 2019-04-20 (×9): qty 1

## 2019-04-20 MED ORDER — POTASSIUM CHLORIDE 10 MEQ/100ML IV SOLN
10.0000 meq | INTRAVENOUS | Status: AC
  Administered 2019-04-21 (×3): 10 meq via INTRAVENOUS
  Filled 2019-04-20 (×3): qty 100

## 2019-04-20 MED ORDER — HEPARIN SODIUM (PORCINE) 5000 UNIT/ML IJ SOLN
5000.0000 [IU] | Freq: Three times a day (TID) | INTRAMUSCULAR | Status: DC
  Administered 2019-04-21 – 2019-04-23 (×8): 5000 [IU] via SUBCUTANEOUS
  Filled 2019-04-20 (×7): qty 1

## 2019-04-20 MED ORDER — SODIUM CHLORIDE 0.9 % IV SOLN
INTRAVENOUS | Status: DC
  Administered 2019-04-20: 23:00:00 via INTRAVENOUS

## 2019-04-20 MED ORDER — POLYETHYLENE GLYCOL 3350 17 G PO PACK: 17.0000 g | PACK | Freq: Every day | ORAL | Status: DC | PRN

## 2019-04-20 MED ORDER — ACETAMINOPHEN 650 MG RE SUPP: 650.0000 mg | Freq: Four times a day (QID) | RECTAL | Status: DC | PRN

## 2019-04-20 MED ORDER — CARBIDOPA-LEVODOPA 25-100 MG PO TABS
1.0000 | ORAL_TABLET | Freq: Three times a day (TID) | ORAL | Status: DC
  Administered 2019-04-21 – 2019-04-23 (×8): 1 via ORAL
  Filled 2019-04-20 (×9): qty 1

## 2019-04-20 MED ORDER — CLOPIDOGREL BISULFATE 75 MG PO TABS
75.0000 mg | ORAL_TABLET | Freq: Every day | ORAL | Status: DC
  Administered 2019-04-21 – 2019-04-22 (×2): 75 mg via ORAL
  Filled 2019-04-20 (×2): qty 1

## 2019-04-20 MED ORDER — FLUDROCORTISONE ACETATE 0.1 MG PO TABS: 0.2000 mg | ORAL_TABLET | Freq: Every day | ORAL | Status: DC

## 2019-04-20 MED ORDER — ASPIRIN 81 MG PO CHEW
81.0000 mg | CHEWABLE_TABLET | Freq: Every day | ORAL | Status: DC
  Administered 2019-04-21 – 2019-04-23 (×3): 81 mg via ORAL
  Filled 2019-04-20 (×3): qty 1

## 2019-04-20 MED ORDER — MAGNESIUM SULFATE IN D5W 1-5 GM/100ML-% IV SOLN
1.0000 g | Freq: Once | INTRAVENOUS | Status: AC
  Filled 2019-04-20: qty 100

## 2019-04-20 MED ORDER — DILTIAZEM HCL ER COATED BEADS 180 MG PO CP24
300.0000 mg | ORAL_CAPSULE | Freq: Every day | ORAL | Status: DC
  Administered 2019-04-21 – 2019-04-22 (×2): 300 mg via ORAL
  Filled 2019-04-20 (×2): qty 1

## 2019-04-20 MED ORDER — VITAMIN B-1 100 MG PO TABS
100.0000 mg | ORAL_TABLET | Freq: Every day | ORAL | Status: DC
  Administered 2019-04-21 – 2019-04-22 (×2): 100 mg via ORAL
  Filled 2019-04-20 (×2): qty 1

## 2019-04-20 MED ORDER — MAGNESIUM SULFATE IN D5W 1-5 GM/100ML-% IV SOLN
1.0000 g | Freq: Once | INTRAVENOUS | Status: AC
  Administered 2019-04-20: 1 g via INTRAVENOUS
  Filled 2019-04-20: qty 100

## 2019-04-20 NOTE — ED Provider Notes (Signed)
[facility] EMERGENCY DEPARTMENT Provider Note   CSN: [id number] Arrival date & time: 04/20/19  1624    History   Chief Complaint Chief Complaint  Patient presents with  . Low Potassium    HPI Jose Baldwin is a 85 y.o. male who presents to the ED for low potassium level of 1.9. Per son pt was at the [place] in [place] today for routine follow up with the neurologist for his parkinson's disease when they got blood work. Son got a call about 2 hours later regarding pt's potassium and need to go to the ED immediately. Per son pt was taken off of his potassium supplements about 1 month ago as he reports they were told to take him off by a physician. No vomiting or diarrhea recently. Pt is otherwise asymptomatic.      The history is provided by the patient and a relative.    Past Medical History:  Diagnosis Date  . Hypertension   . Prostate cancer ([place])   . Syncope     Patient Active Problem List   Diagnosis Date Noted  . Hypokalemia 04/20/2019  . Hypomagnesemia 04/20/2019  . Parkinson's disease ([place]) 04/20/2019  . Cardiac arrhythmia 04/20/2019  . Acute renal failure superimposed on stage 3 chronic kidney disease ([place]) 04/20/2019  . Protein-calorie malnutrition, severe 03/31/2019  . Cerebral embolism with cerebral infarction 03/30/2019  . POTS (postural orthostatic tachycardia syndrome) 03/28/2019  . HTN (hypertension) 02/04/2018  . Prostate cancer ([place]) 02/04/2018  . Orthostatic hypotension 02/04/2018  . Iron deficiency anemia 02/04/2018  . Syncope 02/02/2018    Past Surgical History:  Procedure Laterality Date  . BACK SURGERY    . IR VERTEBROPLASTY CERV/THOR BX INC UNI/BIL INC/INJECT/IMAGING  01/31/2018  . IR VERTEBROPLASTY CERV/THOR BX INC UNI/BIL INC/INJECT/IMAGING  03/24/2019        Home Medications    Prior to Admission medications   Medication Sig Start Date End Date Taking? Authorizing Provider  aspirin 81 MG chewable tablet Chew 1  tablet (81 mg total) by mouth daily. 04/01/19  Yes Nita Sells, MD  carbidopa-levodopa (SINEMET IR) 25-100 MG tablet Take 1 tablet by mouth 3 (three) times daily. Patient taking differently: Take 1 tablet by mouth See admin instructions. Take 1 tablet by mouth three times a day (8 AM, 12 NOON, and 7 PM) 03/31/19  Yes Nita Sells, MD  cloNIDine (CATAPRES) 0.2 MG tablet Take 0.2 mg by mouth See admin instructions. Take 0.2 mg by mouth three times a day (8 AM, 12 NOON, and 7 PM)   Yes [provider]  diltiazem (CARDIZEM CD) 300 MG 24 hr capsule Take 300 mg by mouth at bedtime.   Yes [provider]  ketoconazole (NIZORAL) 2 % cream Apply 1 application topically daily. Patient taking differently: Apply 1 application topically daily as needed (facial irritation).  04/07/19  Yes Billie Ruddy, MD  ranibizumab (LUCENTIS) 0.5 MG/0.05ML SOLN 0.5 mg by Intravitreal route every 8 (eight) weeks.    Yes [provider]  thiamine (VITAMIN B-1) 100 MG tablet Take 100 mg by mouth at bedtime.    Yes [provider]  clopidogrel (PLAVIX) 75 MG tablet Take 1 tablet (75 mg total) by mouth daily. Patient not taking: Reported on 04/20/2019 04/01/19   Nita Sells, MD  fludrocortisone (FLORINEF) 0.1 MG tablet Take 2 tablets (0.2 mg total) by mouth at bedtime. 12/29/18   Minus Breeding, MD  QUEtiapine (SEROQUEL) 25 MG tablet Take 25 mg by mouth  at bedtime.    [provider]  rosuvastatin (CRESTOR) 40 MG tablet Take 1 tablet (40 mg total) by mouth daily at 6 PM. Patient not taking: Reported on 04/20/2019 03/31/19   Nita Sells, MD    Family History Family History  Problem Relation Age of Onset  . Heart attack Father 56    Social History Social History   Tobacco Use  . Smoking status: Former Smoker    Packs/day: 2.00    Years: 30.00    Pack years: 60.00    Types: Cigarettes    Quit date: 1990    Years since quitting: 30.6  .  Smokeless tobacco: Never Used  Substance Use Topics  . Alcohol use: Yes    Alcohol/week: 14.0 standard drinks    Types: 14 Cans of beer per week  . Drug use: Never     Allergies   Patient has no known allergies.   Review of Systems Review of Systems  All other systems reviewed and are negative.    Physical Exam Updated Vital Signs BP (!) 189/68 (BP Location: Left Arm)   Pulse 61   Temp 98 F (36.7 C) (Oral)   Resp 18   Ht 6\' 4"  (1.93 m)   Wt 54.4 kg   SpO2 98%   BMI 14.61 kg/m   Physical Exam Vitals signs and nursing note reviewed.  Constitutional:      Appearance: He is not ill-appearing.  HENT:     Head: Normocephalic and atraumatic.  Eyes:     Conjunctiva/sclera: Conjunctivae normal.  Neck:     Musculoskeletal: Neck supple.  Cardiovascular:     Rate and Rhythm: Normal rate and regular rhythm.  Pulmonary:     Effort: Pulmonary effort is normal.     Breath sounds: Normal breath sounds. No wheezing, rhonchi or rales.  Abdominal:     Palpations: Abdomen is soft.     Tenderness: There is no abdominal tenderness. There is no guarding or rebound.  Skin:    General: Skin is warm and dry.  Neurological:     Mental Status: He is alert.      ED Treatments / Results  Labs (all labs ordered are listed, but only abnormal results are displayed) Labs Reviewed  COMPREHENSIVE METABOLIC PANEL - Abnormal; Notable for the following components:      Result Value   Potassium 2.4 (*)    Glucose, Bld 103 (*)    BUN 29 (*)    Creatinine, Ser 2.17 (*)    Calcium 8.3 (*)    Total Protein 6.3 (*)    Albumin 3.3 (*)    GFR calc non Af Amer 26 (*)    GFR calc Af Amer 30 (*)    All other components within normal limits  CBC WITH DIFFERENTIAL/PLATELET - Abnormal; Notable for the following components:   RBC 3.23 (*)    Hemoglobin 9.6 (*)    HCT 29.9 (*)    All other components within normal limits  MAGNESIUM - Abnormal; Notable for the following components:    Magnesium 1.6 (*)    All other components within normal limits  SARS CORONAVIRUS 2  URINALYSIS, COMPLETE (UACMP) WITH MICROSCOPIC  CREATININE, URINE, RANDOM  NA AND K (SODIUM & POTASSIUM), RAND UR    EKG None  Radiology No results found.  Procedures Procedures (including critical care time)  Medications Ordered in ED Medications  potassium chloride 10 mEq in 100 mL IVPB (10 mEq Intravenous New Bag/Given 04/20/19 2001)  magnesium sulfate IVPB 1 g 100 mL (has no administration in time range)  potassium chloride SA (K-DUR) CR tablet 40 mEq (has no administration in time range)  magnesium sulfate IVPB 1 g 100 mL (has no administration in time range)  sodium chloride 0.9 % bolus 1,000 mL (1,000 mLs Intravenous New Bag/Given 04/20/19 1958)     Initial Impression / Assessment and Plan / ED Course  I have reviewed the triage vital signs and the nursing notes.  Pertinent labs & imaging results that were available during my care of the patient were reviewed by me and considered in my medical decision making (see chart for details).  85 year old male presenting to the ED today with hypokalemia initially 1.9 during [place] visit this afternoon; 2.4 in the ED prior to being seen. Was seen at the [facility] for regular check up and told to come to the ED when bloodwork resulted. Pt asymtomatic at this time. No emesis or diarrhea to account for volume loss. Pt was on potassium supplements but was taken off about 1 month ago for unknown cause. Pt also has hx of anemia; is on iron supplements but family reports they have been having difficulty given to pt as he started his carvidopa back and they cannot give the iron within a specific time frame. They are looking to get an iron infusion sometime soon. No complaints of melena or hematochezia.   Bloodwork obtained prior to being seen - potassium 2.4 and mag 1.6. Hgb 9.6 and Hct 29.9. Pt also has a mild AKI with creatinine 2.17. Already receiving IV fluids. Will  replete potassium and mag today in the ED and admit for further eval. Pt's family in agreement with plan at this time.   Clinical Course as of Apr 19 2057  Mon Apr 20, 2019  2036 Discussed case with hospitalist Dr. Myna Hidalgo who agrees to accept patient for admission   [MV]    Clinical Course User Index [MV] Eustaquio Maize, PA-C         Final Clinical Impressions(s) / ED Diagnoses   Final diagnoses:  Hypokalemia  Hypomagnesemia  Iron deficiency anemia, unspecified iron deficiency anemia type  AKI (acute kidney injury) [facility])    ED Discharge Orders    None       Eustaquio Maize, PA-C 04/20/19 2058    Carmin Muskrat, MD 04/23/19 1601

## 2019-04-20 NOTE — ED Notes (Signed)
ED TO INPATIENT HANDOFF REPORT  ED Nurse Name and Phone #: Daralene Milch, RN [phone]  S Name/Age/Gender Jose Baldwin 85 y.o. male Room/Bed: 026V/785Y  Code Status   Code Status: Prior  Home/SNF/Other Home Patient oriented to: self, place, time and situation Is this baseline? Yes   Triage Complete: Triage complete  Chief Complaint low potassium level  Triage Note Pt arrives POV for eval w/ son of low potassium, Per son, pt had bloodwork done at [facility] and they called w/  K+ level of 1.9. Son reports father is asymptomatic    Allergies No Known Allergies  Level of Care/Admitting Diagnosis ED Disposition    ED Disposition Condition [facility] Area: [facility] [[id number]]  Level of Care: Telemetry Medical [104]  I expect the patient will be discharged within 24 hours: Yes  LOW acuity---Tx typically complete <24 hrs---ACUTE conditions typically can be evaluated <24 hours---LABS likely to return to acceptable levels <24 hours---IS near functional baseline---EXPECTED to return to current living arrangement---NOT newly hypoxic: Meets criteria for 5C-Observation unit  Covid Evaluation: Asymptomatic Screening Protocol (No Symptoms)  Diagnosis: Hypokalemia [850277]  Admitting Physician: Vianne Bulls [[id number]]  Attending Physician: Vianne Bulls [[id number]]  PT Class (Do Not Modify): Observation [104]  PT Acc Code (Do Not Modify): Observation [10022]       B Medical/Surgery History Past Medical History:  Diagnosis Date  . Hypertension   . Prostate cancer ([place])   . Syncope    Past Surgical History:  Procedure Laterality Date  . BACK SURGERY    . IR VERTEBROPLASTY CERV/THOR BX INC UNI/BIL INC/INJECT/IMAGING  01/31/2018  . IR VERTEBROPLASTY CERV/THOR BX INC UNI/BIL INC/INJECT/IMAGING  03/24/2019     A IV Location/Drains/Wounds Patient Lines/Drains/Airways Status   Active Line/Drains/Airways    Name:   Placement date:   Placement time:    Site:   Days:   Peripheral IV 04/20/19 Right Forearm   04/20/19    1951    Forearm   less than 1   External Urinary Catheter   03/31/19    1745    -   20   Incision (Closed) 01/31/18 Back Medial;Mid   01/31/18    1124     444   Incision (Closed) 03/24/19 Back Mid   03/24/19    1248     27          Intake/Output Last 24 hours No intake or output data in the 24 hours ending 04/20/19 2150  Labs/Imaging Results for orders placed or performed during the hospital encounter of 04/20/19 (from the past 48 hour(s))  Comprehensive metabolic panel     Status: Abnormal   Collection Time: 04/20/19  5:01 PM  Result Value Ref Range   Sodium 143 135 - 145 mmol/L   Potassium 2.4 (LL) 3.5 - 5.1 mmol/L    Comment: CRITICAL RESULT CALLED TO, READ BACK BY AND VERIFIED WITH: A OLEARY,RN 1745 04/20/2019 WBOND    Chloride 99 98 - 111 mmol/L   CO2 30 22 - 32 mmol/L   Glucose, Bld 103 (H) 70 - 99 mg/dL   BUN 29 (H) 8 - 23 mg/dL   Creatinine, Ser 2.17 (H) 0.61 - 1.24 mg/dL   Calcium 8.3 (L) 8.9 - 10.3 mg/dL   Total Protein 6.3 (L) 6.5 - 8.1 g/dL   Albumin 3.3 (L) 3.5 - 5.0 g/dL   AST 35 15 - 41 U/L   ALT 17 0 - 44  U/L   Alkaline Phosphatase 80 38 - 126 U/L   Total Bilirubin 0.5 0.3 - 1.2 mg/dL   GFR calc non Af Amer 26 (L) >60 mL/min   GFR calc Af Amer 30 (L) >60 mL/min   Anion gap 14 5 - 15    Comment: Performed at [facility] [address], [place], [place] [address]  CBC with Differential     Status: Abnormal   Collection Time: 04/20/19  5:01 PM  Result Value Ref Range   WBC 8.5 4.0 - 10.5 K/uL   RBC 3.23 (L) 4.22 - 5.81 MIL/uL   Hemoglobin 9.6 (L) 13.0 - 17.0 g/dL   HCT 29.9 (L) 39.0 - 52.0 %   MCV 92.6 80.0 - 100.0 fL   MCH 29.7 26.0 - 34.0 pg   MCHC 32.1 30.0 - 36.0 g/dL   RDW 15.1 11.5 - 15.5 %   Platelets 191 150 - 400 K/uL   nRBC 0.0 0.0 - 0.2 %   Neutrophils Relative % 70 %   Neutro Abs 6.0 1.7 - 7.7 K/uL   Lymphocytes Relative 18 %   Lymphs Abs 1.5 0.7 - 4.0 K/uL    Monocytes Relative 9 %   Monocytes Absolute 0.7 0.1 - 1.0 K/uL   Eosinophils Relative 3 %   Eosinophils Absolute 0.2 0.0 - 0.5 K/uL   Basophils Relative 0 %   Basophils Absolute 0.0 0.0 - 0.1 K/uL   Immature Granulocytes 0 %   Abs Immature Granulocytes 0.03 0.00 - 0.07 K/uL    Comment: Performed at [facility] [address], [place], [place] [address]  Magnesium     Status: Abnormal   Collection Time: 04/20/19  5:12 PM  Result Value Ref Range   Magnesium 1.6 (L) 1.7 - 2.4 mg/dL    Comment: Performed at [facility] [address] [place], [place] [address]   No results found.  Pending Labs Unresulted Labs (From admission, onward)    Start     Ordered   04/20/19 2037  Urinalysis, Complete w Microscopic  Once,   STAT     04/20/19 2036   04/20/19 2037  Creatinine, urine, random  Add-on,   AD     04/20/19 2036   04/20/19 2037  Na and K (sodium & potassium), rand urine  Once,   STAT     04/20/19 2036   04/20/19 1902  SARS CORONAVIRUS 2 Nasal Swab Aptima Multi Swab  (Asymptomatic/Tier 2 Patients Labs)  Once,   STAT    Question Answer Comment  Is this test for diagnosis or screening Screening   Symptomatic for COVID-19 as defined by CDC No   Hospitalized for COVID-19 No   Admitted to ICU for COVID-19 No   Previously tested for COVID-19 Yes   Resident in a congregate (group) care setting No   Employed in healthcare setting No      04/20/19 1901   Signed and Held  Basic metabolic panel  Tomorrow morning,   R     Signed and Held   Signed and Held  Magnesium  Tomorrow morning,   R     Signed and Held   Signed and Held  CBC  Tomorrow morning,   R     Signed and Held          Vitals/Pain Today's Vitals   04/20/19 2015 04/20/19 2030 04/20/19 2045 04/20/19 2100  BP: (!) 189/90 (!) 177/88 (!) 198/73 (!) 179/78  Pulse: 68 74 (!) 58   Resp: 12 14 10 14   Temp:      TempSrc:      SpO2: 95% 97% 96%   Weight:      Height:      PainSc:        Isolation  Precautions No active isolations  Medications Medications  potassium chloride 10 mEq in 100 mL IVPB (0 mEq Intravenous Stopped 04/20/19 2103)  magnesium sulfate IVPB 1 g 100 mL (1 g Intravenous New Bag/Given 04/20/19 2102)  potassium chloride SA (K-DUR) CR tablet 40 mEq (has no administration in time range)  magnesium sulfate IVPB 1 g 100 mL (has no administration in time range)  sodium chloride 0.9 % bolus 1,000 mL (0 mLs Intravenous Stopped 04/20/19 2102)    Mobility manual wheelchair Low fall risk   Focused Assessments Pt lives at home but son has been staying with him   R Recommendations: See Admitting Provider Note  Report given to:   Additional Notes:

## 2019-04-20 NOTE — ED Triage Notes (Signed)
Pt arrives POV for eval w/ son of low potassium, Per son, pt had bloodwork done at [place] and they called w/  K+ level of 1.9. Son reports father is asymptomatic

## 2019-04-20 NOTE — H&P (Signed)
History and Physical    Jose Baldwin DOB: 11-26-1928 DOA: 04/20/2019  PCP: Billie Ruddy, MD   Patient coming from: Home   Chief Complaint: Abnormal outpatient blood work   HPI: Jose Baldwin is a 85 y.o. male with medical history significant for hypertension, prostate cancer, Parkinson's disease, postural orthostatic tachycardia syndrome, and history of CVA, now presenting to the emergency department for evaluation of abnormal outpatient labs.  Patient was discharged from the hospital 2 to 3 weeks ago, was seen at the [facility] in follow-up, had basic blood work obtained, and was called back with instructions to present to the ED for evaluation of low potassium.  The patient denies any chest pain, palpitations, abdominal pain, vomiting, or diarrhea.  He denies any recent fevers, chills, shortness breath, or cough.  He had previously been on potassium supplements, but these were discontinued when he was discharged last month.  ED Course: Upon arrival to the ED, patient is found to be afebrile, saturating well on room air, mildly hypertensive, and with vitals otherwise normal.  Chemistry panel is notable for potassium of 2.4 and creatinine of 2.17, up from 1.19 last month.  CBC features a normocytic anemia.  EKG with undetermined rhythm, nonspecific IVCD, and prolonged QT interval.  Patient was given a liter of normal saline, 40 mEq IV potassium, 1 g IV magnesium, and hospitalist were consulted for admission.  Review of Systems:  All other systems reviewed and apart from HPI, are negative.  Past Medical History:  Diagnosis Date  . Hypertension   . Prostate cancer ([place])   . Syncope     Past Surgical History:  Procedure Laterality Date  . BACK SURGERY    . IR VERTEBROPLASTY CERV/THOR BX INC UNI/BIL INC/INJECT/IMAGING  01/31/2018  . IR VERTEBROPLASTY CERV/THOR BX INC UNI/BIL INC/INJECT/IMAGING  03/24/2019     reports that he quit smoking about 30 years ago. His smoking use  included cigarettes. He has a 60.00 pack-year smoking history. He has never used smokeless tobacco. He reports current alcohol use of about 14.0 standard drinks of alcohol per week. He reports that he does not use drugs.  No Known Allergies  Family History  Problem Relation Age of Onset  . Heart attack Father 75     Prior to Admission medications   Medication Sig Start Date End Date Taking? Authorizing Provider  aspirin 81 MG chewable tablet Chew 1 tablet (81 mg total) by mouth daily. 04/01/19  Yes Nita Sells, MD  carbidopa-levodopa (SINEMET IR) 25-100 MG tablet Take 1 tablet by mouth 3 (three) times daily. Patient taking differently: Take 1 tablet by mouth See admin instructions. Take 1 tablet by mouth three times a day (8 AM, 12 NOON, and 7 PM) 03/31/19  Yes Nita Sells, MD  cloNIDine (CATAPRES) 0.2 MG tablet Take 0.2 mg by mouth See admin instructions. Take 0.2 mg by mouth three times a day (8 AM, 12 NOON, and 7 PM)   Yes [provider]  diltiazem (CARDIZEM CD) 300 MG 24 hr capsule Take 300 mg by mouth at bedtime.   Yes [provider]  ketoconazole (NIZORAL) 2 % cream Apply 1 application topically daily. Patient taking differently: Apply 1 application topically daily as needed (facial irritation).  04/07/19  Yes Billie Ruddy, MD  ranibizumab (LUCENTIS) 0.5 MG/0.05ML SOLN 0.5 mg by Intravitreal route every 8 (eight) weeks.    Yes [provider]  thiamine (VITAMIN B-1) 100 MG tablet Take 100 mg by mouth  at bedtime.    Yes [provider]  clopidogrel (PLAVIX) 75 MG tablet Take 1 tablet (75 mg total) by mouth daily. Patient not taking: Reported on 04/20/2019 04/01/19   Nita Sells, MD  fludrocortisone (FLORINEF) 0.1 MG tablet Take 2 tablets (0.2 mg total) by mouth at bedtime. 12/29/18   Minus Breeding, MD  QUEtiapine (SEROQUEL) 25 MG tablet Take 25 mg by mouth at bedtime.    [provider]  rosuvastatin (CRESTOR) 40  MG tablet Take 1 tablet (40 mg total) by mouth daily at 6 PM. Patient not taking: Reported on 04/20/2019 03/31/19   Nita Sells, MD    Physical Exam: Vitals:   04/20/19 1639 04/20/19 1644  BP: (!) 189/68   Pulse: 61   Resp: 18   Temp: 98 F (36.7 C)   TempSrc: Oral   SpO2: 98%   Weight:  54.4 kg  Height:  6\' 4"  (1.93 m)    Constitutional: NAD, calm, frail  Eyes: PERTLA, lids and conjunctivae normal ENMT: Mucous membranes are moist. Posterior pharynx clear of any exudate or lesions.   Neck: normal, supple, no masses, no thyromegaly Respiratory: clear to auscultation bilaterally, no wheezing, no crackles. No accessory muscle use.  Cardiovascular: S1 & S2 heard, regular rate and rhythm. Pretibial pitting edema bilaterally. Abdomen: No distension, no tenderness, soft. Bowel sounds normal.  Musculoskeletal: no clubbing / cyanosis. No joint deformity upper and lower extremities.    Skin: no significant rashes, lesions, ulcers. Warm, dry, well-perfused. Neurologic: CN 2-12 grossly intact. Sensation intact. Moving all extremities.  Psychiatric: Alert and oriented to person, place, and situation. Pleasant, cooperative.     Labs on Admission: I have personally reviewed following labs and imaging studies  CBC: Recent Labs  Lab 04/20/19 1701  WBC 8.5  NEUTROABS 6.0  HGB 9.6*  HCT 29.9*  MCV 92.6  PLT 767   Basic Metabolic Panel: Recent Labs  Lab 04/20/19 1701 04/20/19 1712  NA 143  --   K 2.4*  --   CL 99  --   CO2 30  --   GLUCOSE 103*  --   BUN 29*  --   CREATININE 2.17*  --   CALCIUM 8.3*  --   MG  --  1.6*   GFR: Estimated Creatinine Clearance: 18.1 mL/min (A) (by C-G formula based on SCr of 2.17 mg/dL (H)). Liver Function Tests: Recent Labs  Lab 04/20/19 1701  AST 35  ALT 17  ALKPHOS 80  BILITOT 0.5  PROT 6.3*  ALBUMIN 3.3*   No results for input(s): LIPASE, AMYLASE in the last 168 hours. No results for input(s): AMMONIA in the last 168  hours. Coagulation Profile: No results for input(s): INR, PROTIME in the last 168 hours. Cardiac Enzymes: No results for input(s): CKTOTAL, CKMB, CKMBINDEX, TROPONINI in the last 168 hours. BNP (last 3 results) No results for input(s): PROBNP in the last 8760 hours. HbA1C: No results for input(s): HGBA1C in the last 72 hours. CBG: No results for input(s): GLUCAP in the last 168 hours. Lipid Profile: No results for input(s): CHOL, HDL, LDLCALC, TRIG, CHOLHDL, LDLDIRECT in the last 72 hours. Thyroid Function Tests: No results for input(s): TSH, T4TOTAL, FREET4, T3FREE, THYROIDAB in the last 72 hours. Anemia Panel: No results for input(s): VITAMINB12, FOLATE, FERRITIN, TIBC, IRON, RETICCTPCT in the last 72 hours. Urine analysis:    Component Value Date/Time   COLORURINE YELLOW 03/28/2019 1510   APPEARANCEUR CLEAR 03/28/2019 1510   LABSPEC 1.014 03/28/2019 1510  PHURINE 6.0 03/28/2019 1510   GLUCOSEU NEGATIVE 03/28/2019 1510   HGBUR NEGATIVE 03/28/2019 1510   BILIRUBINUR NEGATIVE 03/28/2019 1510   KETONESUR NEGATIVE 03/28/2019 1510   PROTEINUR NEGATIVE 03/28/2019 1510   NITRITE NEGATIVE 03/28/2019 1510   LEUKOCYTESUR NEGATIVE 03/28/2019 1510   Sepsis Labs: @LABRCNTIP (procalcitonin:4,lacticidven:4) )No results found for this or any previous visit (from the past 240 hour(s)).   Radiological Exams on Admission: No results found.  EKG: Independently reviewed. Undetermined rhythm, limited by significant artifact, non-specific IVCD, QTc 567 ms.   Assessment/Plan   1. Acute kidney injury superimposed on CKD III  - SCr is 2.17 on admission, up from 1.19 last month  - He has lower leg edema b/l, possibly from his steroid, but otherwise appears hypovolemic; has prostate CA history followed at [place]   - Given a liter NS in ED  - Check UA and FENa, check renal US, continue IVF hydration, renally-dose medications, avoid nephrotoxins, and repeat chem panel in am    2. Hypokalemia,  hypomagnesemia  - Serum potassium is 2.4 in ED with magnesium 1.6  - No N/V/D, not on diuretics, possibly from fludrocortisone;  - His potassium supplements were discontinued last month  - Treated in ED with 40 mEq IV potassium and 1 g IV magnesium  - Given additional 40 mEq oral potassium and 1 g IV magnesium, continue cardiac monitoring, repeat chemistries in am    3. Parkinson's  - Continue Sinemet   4. History of CVA  - Continue ASA, Plavix, Crestor    5. Transient cardiac arrhythmias  - Transient arrhythmia, possibly a fib noted in ED, also noted during admission last month  - Replace electrolytes as above, continue cardiac monitoring, repeat EKG in am    6. POTS  - Continue fludrocortisone   7. Prostate cancer  - He will [facility]     PPE: Mask, face shield  DVT prophylaxis: sq heparin  Code Status: Full  Family Communication: Discussed with patient  Consults called: None  Admission status: Observation     Vianne Bulls, MD [facility] Pager [phone]  If 7PM-7AM, please contact night-coverage [email or web address] Password [facility]  04/20/2019, 8:41 PM

## 2019-04-21 ENCOUNTER — Other Ambulatory Visit: Payer: Self-pay

## 2019-04-21 DIAGNOSIS — Z66 Do not resuscitate: Secondary | ICD-10-CM | POA: Diagnosis present

## 2019-04-21 DIAGNOSIS — I499 Cardiac arrhythmia, unspecified: Secondary | ICD-10-CM | POA: Diagnosis not present

## 2019-04-21 DIAGNOSIS — Z8673 Personal history of transient ischemic attack (TIA), and cerebral infarction without residual deficits: Secondary | ICD-10-CM | POA: Diagnosis not present

## 2019-04-21 DIAGNOSIS — N179 Acute kidney failure, unspecified: Secondary | ICD-10-CM | POA: Diagnosis present

## 2019-04-21 DIAGNOSIS — Z7902 Long term (current) use of antithrombotics/antiplatelets: Secondary | ICD-10-CM | POA: Diagnosis not present

## 2019-04-21 DIAGNOSIS — Z20828 Contact with and (suspected) exposure to other viral communicable diseases: Secondary | ICD-10-CM | POA: Diagnosis present

## 2019-04-21 DIAGNOSIS — E876 Hypokalemia: Secondary | ICD-10-CM | POA: Diagnosis present

## 2019-04-21 DIAGNOSIS — I498 Other specified cardiac arrhythmias: Secondary | ICD-10-CM | POA: Diagnosis present

## 2019-04-21 DIAGNOSIS — Z8249 Family history of ischemic heart disease and other diseases of the circulatory system: Secondary | ICD-10-CM | POA: Diagnosis not present

## 2019-04-21 DIAGNOSIS — G20 Parkinson's disease: Secondary | ICD-10-CM | POA: Diagnosis present

## 2019-04-21 DIAGNOSIS — E43 Unspecified severe protein-calorie malnutrition: Secondary | ICD-10-CM | POA: Diagnosis present

## 2019-04-21 DIAGNOSIS — Z7982 Long term (current) use of aspirin: Secondary | ICD-10-CM | POA: Diagnosis not present

## 2019-04-21 DIAGNOSIS — Z8546 Personal history of malignant neoplasm of prostate: Secondary | ICD-10-CM | POA: Diagnosis not present

## 2019-04-21 DIAGNOSIS — D509 Iron deficiency anemia, unspecified: Secondary | ICD-10-CM

## 2019-04-21 DIAGNOSIS — Z87891 Personal history of nicotine dependence: Secondary | ICD-10-CM | POA: Diagnosis not present

## 2019-04-21 DIAGNOSIS — Z681 Body mass index (BMI) 19 or less, adult: Secondary | ICD-10-CM | POA: Diagnosis not present

## 2019-04-21 DIAGNOSIS — Z79899 Other long term (current) drug therapy: Secondary | ICD-10-CM | POA: Diagnosis not present

## 2019-04-21 DIAGNOSIS — N183 Chronic kidney disease, stage 3 (moderate): Secondary | ICD-10-CM | POA: Diagnosis present

## 2019-04-21 DIAGNOSIS — I129 Hypertensive chronic kidney disease with stage 1 through stage 4 chronic kidney disease, or unspecified chronic kidney disease: Secondary | ICD-10-CM | POA: Diagnosis present

## 2019-04-21 DIAGNOSIS — E861 Hypovolemia: Secondary | ICD-10-CM | POA: Diagnosis present

## 2019-04-21 LAB — URINALYSIS, COMPLETE (UACMP) WITH MICROSCOPIC
Bacteria, UA: NONE SEEN
Bilirubin Urine: NEGATIVE
Glucose, UA: NEGATIVE mg/dL
Ketones, ur: NEGATIVE mg/dL
Leukocytes,Ua: NEGATIVE
Nitrite: NEGATIVE
Protein, ur: NEGATIVE mg/dL
Specific Gravity, Urine: 1.006 (ref 1.005–1.030)
pH: 7 (ref 5.0–8.0)

## 2019-04-21 LAB — CREATININE, URINE, RANDOM: Creatinine, Urine: 25.69 mg/dL

## 2019-04-21 LAB — BASIC METABOLIC PANEL
Anion gap: 9 (ref 5–15)
Anion gap: 9 (ref 5–15)
BUN: 24 mg/dL — ABNORMAL HIGH (ref 8–23)
BUN: 25 mg/dL — ABNORMAL HIGH (ref 8–23)
CO2: 29 mmol/L (ref 22–32)
CO2: 28 mmol/L (ref 22–32)
Calcium: 7.7 mg/dL — ABNORMAL LOW (ref 8.9–10.3)
Calcium: 7.5 mg/dL — ABNORMAL LOW (ref 8.9–10.3)
Chloride: 104 mmol/L (ref 98–111)
Chloride: 102 mmol/L (ref 98–111)
Creatinine, Ser: 1.85 mg/dL — ABNORMAL HIGH (ref 0.61–1.24)
Creatinine, Ser: 1.97 mg/dL — ABNORMAL HIGH (ref 0.61–1.24)
GFR calc Af Amer: 37 mL/min — ABNORMAL LOW (ref >60)
GFR calc Af Amer: 34 mL/min — ABNORMAL LOW (ref >60)
GFR calc non Af Amer: 32 mL/min — ABNORMAL LOW (ref >60)
GFR calc non Af Amer: 29 mL/min — ABNORMAL LOW (ref >60)
Glucose, Bld: 94 mg/dL (ref 70–99)
Glucose, Bld: 130 mg/dL — ABNORMAL HIGH (ref 70–99)
Potassium: 2.3 mmol/L — CL (ref 3.5–5.1)
Potassium: 2.6 mmol/L — CL (ref 3.5–5.1)
Sodium: 142 mmol/L (ref 135–145)
Sodium: 139 mmol/L (ref 135–145)

## 2019-04-21 LAB — CBC
HCT: 23.7 % — ABNORMAL LOW (ref 39.0–52.0)
Hemoglobin: 7.8 g/dL — ABNORMAL LOW (ref 13.0–17.0)
MCH: 30.1 pg (ref 26.0–34.0)
MCHC: 32.9 g/dL (ref 30.0–36.0)
MCV: 91.5 fL (ref 80.0–100.0)
Platelets: 148 10*3/uL — ABNORMAL LOW (ref 150–400)
RBC: 2.59 MIL/uL — ABNORMAL LOW (ref 4.22–5.81)
RDW: 15.2 % (ref 11.5–15.5)
WBC: 6.5 10*3/uL (ref 4.0–10.5)
nRBC: 0 % (ref 0.0–0.2)

## 2019-04-21 LAB — NA AND K (SODIUM & POTASSIUM), RAND UR
Potassium Urine: 13 mmol/L
Sodium, Ur: 100 mmol/L

## 2019-04-21 LAB — SARS CORONAVIRUS 2 (TAT 6-24 HRS): SARS Coronavirus 2: NEGATIVE

## 2019-04-21 LAB — MAGNESIUM: Magnesium: 1.6 mg/dL — ABNORMAL LOW (ref 1.7–2.4)

## 2019-04-21 MED ORDER — ENSURE ENLIVE PO LIQD
237.0000 mL | Freq: Two times a day (BID) | ORAL | Status: DC
  Administered 2019-04-21 – 2019-04-23 (×5): 237 mL via ORAL

## 2019-04-21 MED ORDER — POTASSIUM CHLORIDE CRYS ER 20 MEQ PO TBCR
40.0000 meq | EXTENDED_RELEASE_TABLET | ORAL | Status: AC
  Administered 2019-04-21 (×2): 40 meq via ORAL
  Filled 2019-04-21 (×2): qty 2

## 2019-04-21 MED ORDER — POTASSIUM CHLORIDE 10 MEQ/100ML IV SOLN
10.0000 meq | INTRAVENOUS | Status: AC
  Administered 2019-04-21 (×4): 10 meq via INTRAVENOUS
  Filled 2019-04-21 (×4): qty 100

## 2019-04-21 MED ORDER — POTASSIUM CHLORIDE 10 MEQ/100ML IV SOLN
10.0000 meq | INTRAVENOUS | Status: AC
  Administered 2019-04-21 (×2): 10 meq via INTRAVENOUS
  Filled 2019-04-21 (×2): qty 100

## 2019-04-21 MED ORDER — POTASSIUM CHLORIDE CRYS ER 20 MEQ PO TBCR
40.0000 meq | EXTENDED_RELEASE_TABLET | Freq: Once | ORAL | Status: AC
  Administered 2019-04-21: 40 meq via ORAL
  Filled 2019-04-21: qty 2

## 2019-04-21 MED ORDER — MAGNESIUM SULFATE 2 GM/50ML IV SOLN
2.0000 g | Freq: Once | INTRAVENOUS | Status: AC
  Administered 2019-04-21: 2 g via INTRAVENOUS
  Filled 2019-04-21: qty 50

## 2019-04-21 MED ORDER — SODIUM CHLORIDE 0.9 % IV SOLN
INTRAVENOUS | Status: AC
  Administered 2019-04-21: 11:00:00 via INTRAVENOUS

## 2019-04-21 NOTE — Progress Notes (Signed)
PROGRESS NOTE  Jose Baldwin DDU:[id number] DOB: [birth year] DOA: 04/20/2019 PCP: Billie Ruddy, MD   LOS: 0 days   Brief Narrative / Interim history: 85 year old male with history of hypertension, prostate cancer, Parkinson's disease, falls, prior CVA came into the hospital from his SNF due to abnormal blood work.  He was told that his potassium was low and was directed to the hospital.  He currently has no complaints, denies any chest pain, abdominal pain, nausea vomiting or diarrhea.  He denies any muscle weakness.  Subjective: He denies any weakness, denies any chest pain, shortness of breath.  No nausea, vomiting or diarrhea  Assessment & Plan: Principal Problem:   Hypokalemia Active Problems:   Prostate cancer ([place])   Hypomagnesemia   Parkinson's disease (HCC)   Cardiac arrhythmia   Acute renal failure superimposed on stage 3 chronic kidney disease (HCC)   Principal Problem Acute kidney injury on chronic kidney disease stage III -Serum creatinine is 2.17 on admission, increased from 1.2 last month.  He does not to be on any overt nephrotoxins, although may be slightly intravascularly depleted.  Has received IV fluids overnight and his creatinine improved to 1.85 this morning however still worse than his baseline -Continue IV fluids.  He has a degree of chronic lower extremity edema  Active Problems Hypokalemia, hypomagnesemia -He is on fludrocortisone which can definitely cause hypokalemia.  He was on supplements in the past will likely need to be back on chronic potassium supplementation. -Remains profoundly hypokalemic today and in fact his potassium is worse at 2.3, continue aggressive oral and intravenous repletion and close monitoring in the hospital setting, continue to monitor on telemetry, continue to aggressively replete magnesium as well -Repeat potassium later today as well as tomorrow morning  Parkinson's disease -Continue Sinemet  History of CVA -Continue  aspirin, Plavix, statin  Transient ? arrhythmias -poor tracing EKGs obtained on admission, on telemetry he appears to be in sinus rhythm with PACs.  There was a question about A. fib in the past but I do not see A. fib rhythm.  Continue to monitor.  Continue home medications  Hypertension -Continue clonidine, diltiazem  Pots -Continue fludrocortisone  Prostate cancer -Outpatient follow-up   Scheduled Meds: . aspirin  81 mg Oral Daily  . carbidopa-levodopa  1 tablet Oral TID  . cloNIDine  0.2 mg Oral TID  . clopidogrel  75 mg Oral Daily  . diltiazem  300 mg Oral QHS  . fludrocortisone  0.2 mg Oral QHS  . heparin  5,000 Units Subcutaneous Q8H  . rosuvastatin  40 mg Oral q1800  . sodium chloride flush  3 mL Intravenous Q12H  . thiamine  100 mg Oral QHS   Continuous Infusions: . sodium chloride    . potassium chloride 10 mEq (04/21/19 1043)   PRN Meds:.acetaminophen **OR** acetaminophen, HYDROcodone-acetaminophen, ondansetron **OR** ondansetron (ZOFRAN) IV, polyethylene glycol  DVT prophylaxis: heparin Code Status: Full code Family Communication: d/w patient Disposition Plan: back to SNF once stable  Consultants:   None   Procedures:   None   Antimicrobials:  None    Objective: Vitals:   04/20/19 2100 04/20/19 2253 04/21/19 0514 04/21/19 0908  BP: (!) 179/78 (!) 193/74 (!) 179/76 (!) 153/98  Pulse:  71 63 (!) 50  Resp: 14 (!) 21 20 18   Temp:  97.9 F (36.6 C) (!) 97.5 F (36.4 C) 97.9 F (36.6 C)  TempSrc:  Oral Oral Oral  SpO2:  94% 94% 93%  Weight:  55  kg    Height:  6\' 3"  (1.905 m)      Intake/Output Summary (Last 24 hours) at 04/21/2019 1056 Last data filed at 04/21/2019 1000 Gross per 24 hour  Intake 2742.51 ml  Output 825 ml  Net 1917.51 ml   Filed Weights   04/20/19 1644 04/20/19 2253  Weight: 54.4 kg 55 kg    Examination:  Constitutional: NAD, eating breakfast Eyes: PERRL, lids and conjunctivae normal ENMT: Mucous membranes are  moist.  Respiratory: clear to auscultation bilaterally, no wheezing, no crackles. Normal respiratory effort. No accessory muscle use.  Cardiovascular: Regular rate and rhythm, 1+ pitting lower extremity edema Abdomen: no tenderness. Bowel sounds positive.  Musculoskeletal: no clubbing / cyanosis.  Skin: no rashes, lesions, ulcers. No induration Neurologic: Nonfocal, equal strength Psychiatric: Normal judgment and insight. Alert and oriented x 3. Normal mood.    Data Reviewed: I have independently reviewed following labs and imaging studies   CBC: Recent Labs  Lab 04/20/19 1701 04/21/19 0554  WBC 8.5 6.5  NEUTROABS 6.0  --   HGB 9.6* 7.8*  HCT 29.9* 23.7*  MCV 92.6 91.5  PLT 191 287*   Basic Metabolic Panel: Recent Labs  Lab 04/20/19 1701 04/20/19 1712 04/21/19 0554  NA 143  --  142  K 2.4*  --  2.3*  CL 99  --  104  CO2 30  --  29  GLUCOSE 103*  --  94  BUN 29*  --  24*  CREATININE 2.17*  --  1.85*  CALCIUM 8.3*  --  7.7*  MG  --  1.6* 1.6*   GFR: Estimated Creatinine Clearance: 21.5 mL/min (A) (by C-G formula based on SCr of 1.85 mg/dL (H)). Liver Function Tests: Recent Labs  Lab 04/20/19 1701  AST 35  ALT 17  ALKPHOS 80  BILITOT 0.5  PROT 6.3*  ALBUMIN 3.3*   No results for input(s): LIPASE, AMYLASE in the last 168 hours. No results for input(s): AMMONIA in the last 168 hours. Coagulation Profile: No results for input(s): INR, PROTIME in the last 168 hours. Cardiac Enzymes: No results for input(s): CKTOTAL, CKMB, CKMBINDEX, TROPONINI in the last 168 hours. BNP (last 3 results) No results for input(s): PROBNP in the last 8760 hours. HbA1C: No results for input(s): HGBA1C in the last 72 hours. CBG: No results for input(s): GLUCAP in the last 168 hours. Lipid Profile: No results for input(s): CHOL, HDL, LDLCALC, TRIG, CHOLHDL, LDLDIRECT in the last 72 hours. Thyroid Function Tests: No results for input(s): TSH, T4TOTAL, FREET4, T3FREE, THYROIDAB in  the last 72 hours. Anemia Panel: No results for input(s): VITAMINB12, FOLATE, FERRITIN, TIBC, IRON, RETICCTPCT in the last 72 hours. Urine analysis:    Component Value Date/Time   COLORURINE STRAW (A) 04/21/2019 0310   APPEARANCEUR CLEAR 04/21/2019 0310   LABSPEC 1.006 04/21/2019 0310   PHURINE 7.0 04/21/2019 0310   GLUCOSEU NEGATIVE 04/21/2019 0310   HGBUR MODERATE (A) 04/21/2019 0310   BILIRUBINUR NEGATIVE 04/21/2019 0310   KETONESUR NEGATIVE 04/21/2019 0310   PROTEINUR NEGATIVE 04/21/2019 0310   NITRITE NEGATIVE 04/21/2019 0310   LEUKOCYTESUR NEGATIVE 04/21/2019 0310   Sepsis Labs: Invalid input(s): PROCALCITONIN, LACTICIDVEN  Recent Results (from the past 240 hour(s))  SARS CORONAVIRUS 2 Nasal Swab Aptima Multi Swab     Status: None   Collection Time: 04/20/19  8:21 PM   Specimen: Aptima Multi Swab; Nasal Swab  Result Value Ref Range Status   SARS Coronavirus 2 NEGATIVE NEGATIVE Final  Comment: (NOTE) SARS-CoV-2 target nucleic acids are NOT DETECTED. The SARS-CoV-2 RNA is generally detectable in upper and lower respiratory specimens during the acute phase of infection. Negative results do not preclude SARS-CoV-2 infection, do not rule out co-infections with other pathogens, and should not be used as the sole basis for treatment or other patient management decisions. Negative results must be combined with clinical observations, patient history, and epidemiological information. The expected result is Negative. Fact Sheet for Patients: SugarRoll.be Fact Sheet for Healthcare Providers: https://www.woods-mathews.com/ This test is not yet approved or cleared by the [place] FDA and  has been authorized for detection and/or diagnosis of SARS-CoV-2 by FDA under an Emergency Use Authorization (EUA). This EUA will remain  in effect (meaning this test can be used) for the duration of the COVID-19 declaration under Section  56 4(b)(1) of the Act, 21 U.S.C. section 360bbb-3(b)(1), unless the authorization is terminated or revoked sooner. Performed at [facility], [place] [address] [place], [place] [address]       Radiology Studies: US Renal  Result Date: 04/20/2019 CLINICAL DATA:  Acute renal failure EXAM: RENAL / URINARY TRACT ULTRASOUND COMPLETE COMPARISON:  None. FINDINGS: Right Kidney: Renal measurements: 11.5 x 4.9 x 5.9 cm = volume: 173.3 mL. Increased cortical echogenicity compatible with medical renal disease. No concerning mass or hydronephrosis. Left Kidney: Difficult visualization due to poor sonographic window. Renal measurements: 10.2 x 5.5 x 5.8 cm = volume: 168.6 mL. Increased cortical echogenicity compatible with medical renal disease. No concerning mass or hydronephrosis. Bladder: Appears normal for degree of bladder distention. Both bladder jets visualized. IMPRESSION: Increased cortical echogenicity of both kidneys compatible with medical renal disease. No other abnormalities in the kidneys or bladder. Electronically Signed   By: Lovena Le M.D.   On: 04/20/2019 22:10    Marzetta Board, MD, PhD [facility]  Contact via  [email or web address]  [place] P: [phone] F: [phone]

## 2019-04-21 NOTE — Progress Notes (Signed)
Initial Nutrition Assessment  DOCUMENTATION CODES:   Severe malnutrition in context of chronic illness, Underweight  INTERVENTION:    Liberalize diet to regular  Feeding assistance with all meals  Ensure Enlive po BID, each supplement provides 350 kcal and 20 grams of protein  Magic cup TID with meals, each supplement provides 290 kcal and 9 grams of protein  NUTRITION DIAGNOSIS:   Severe Malnutrition related to chronic illness(Parkinson's disease) as evidenced by severe fat depletion, severe muscle depletion.  GOAL:   Patient will meet greater than or equal to 90% of their needs  MONITOR:   PO intake, Supplement acceptance, Labs, I & O's, Skin, Weight trends  REASON FOR ASSESSMENT:   Malnutrition Screening Tool    ASSESSMENT:   Patient with PMH significant forHTN, prostate cancer, Parkinson's disease, falls, and prior CVA. Presents this admission with AKI on CKD III.   Spoke with pt at bedside. Denies loss of appetite PTA. Unable to elaborate on dietary call (suspect confusion?). Denies any issues with swallowing/chewing. Meal completion charted as 100% for his last meal. Amendable to Ensure and Magic cup as he likes ice cream.   Pt reports a UBW of 190 lb and is aware he has lost weight. Per chart records pt's weight has fluctuated from 51.7 kg to 59.9 kg over the last year.   Drips: NS @ 75 ml/hr  Medications: thiamine Labs: K 2.3 (L) Mg 1.6 (L)    NUTRITION - FOCUSED PHYSICAL EXAM:    Most Recent Value  Orbital Region  Severe depletion  Upper Arm Region  Severe depletion  Thoracic and Lumbar Region  Severe depletion  Buccal Region  Severe depletion  Temple Region  Severe depletion  Clavicle Bone Region  Severe depletion  Clavicle and Acromion Bone Region  Severe depletion  Scapular Bone Region  Severe depletion  Dorsal Hand  Severe depletion  Patellar Region  Severe depletion  Anterior Thigh Region  Severe depletion  Posterior Calf Region  Severe  depletion  Edema (RD Assessment)  None  Hair  Reviewed  Eyes  Reviewed  Mouth  Reviewed  Skin  Reviewed  Nails  Reviewed     Diet Order:   Diet Order            Diet Heart Room service appropriate? Yes; Fluid consistency: Thin  Diet effective now              EDUCATION NEEDS:   Education needs have been addressed  Skin:  Skin Assessment: Skin Integrity Issues: Skin Integrity Issues:: Incisions Incisions: closed mid back  Last BM:  7/20  Height:   Ht Readings from Last 1 Encounters:  04/20/19 6\' 3"  (1.905 m)    Weight:   Wt Readings from Last 1 Encounters:  04/20/19 55 kg    Ideal Body Weight:  89.1 kg  BMI:  Body mass index is 15.16 kg/m.  Estimated Nutritional Needs:   Kcal:  1650-1850 kcal  Protein:  80-95 grams  Fluid:  >/= 1.6 L/day   Mariana Single RD, LDN Clinical Nutrition Pager # - [phone]

## 2019-04-21 NOTE — Plan of Care (Signed)
  Problem: Health Behavior/Discharge Planning: Goal: Ability to manage health-related needs will improve Outcome: Progressing   Problem: Activity: Goal: Risk for activity intolerance will decrease Outcome: Progressing   Problem: Nutrition: Goal: Adequate nutrition will be maintained Outcome: Progressing   

## 2019-04-21 NOTE — Progress Notes (Signed)
Lab value for pm today  K 2.6 and MD was notified with new orders.

## 2019-04-21 NOTE — Plan of Care (Signed)
  Problem: Health Behavior/Discharge Planning: Goal: Ability to manage health-related needs will improve 04/21/2019 1106 by Dolores Hoose, RN Outcome: Progressing 04/21/2019 0900 by Dolores Hoose, RN Outcome: Progressing   Problem: Activity: Goal: Risk for activity intolerance will decrease 04/21/2019 1106 by Dolores Hoose, RN Outcome: Progressing 04/21/2019 0900 by Dolores Hoose, RN Outcome: Progressing   Problem: Nutrition: Goal: Adequate nutrition will be maintained 04/21/2019 1106 by Dolores Hoose, RN Outcome: Progressing 04/21/2019 0900 by Dolores Hoose, RN Outcome: Progressing

## 2019-04-22 LAB — BASIC METABOLIC PANEL
Anion gap: 9 (ref 5–15)
BUN: 28 mg/dL — ABNORMAL HIGH (ref 8–23)
CO2: 29 mmol/L (ref 22–32)
Calcium: 7.9 mg/dL — ABNORMAL LOW (ref 8.9–10.3)
Chloride: 104 mmol/L (ref 98–111)
Creatinine, Ser: 1.84 mg/dL — ABNORMAL HIGH (ref 0.61–1.24)
GFR calc Af Amer: 37 mL/min — ABNORMAL LOW (ref >60)
GFR calc non Af Amer: 32 mL/min — ABNORMAL LOW (ref >60)
Glucose, Bld: 104 mg/dL — ABNORMAL HIGH (ref 70–99)
Potassium: 3 mmol/L — ABNORMAL LOW (ref 3.5–5.1)
Sodium: 142 mmol/L (ref 135–145)

## 2019-04-22 LAB — CBC
HCT: 25.9 % — ABNORMAL LOW (ref 39.0–52.0)
Hemoglobin: 8.4 g/dL — ABNORMAL LOW (ref 13.0–17.0)
MCH: 29.9 pg (ref 26.0–34.0)
MCHC: 32.4 g/dL (ref 30.0–36.0)
MCV: 92.2 fL (ref 80.0–100.0)
Platelets: 162 10*3/uL (ref 150–400)
RBC: 2.81 MIL/uL — ABNORMAL LOW (ref 4.22–5.81)
RDW: 15.2 % (ref 11.5–15.5)
WBC: 8 10*3/uL (ref 4.0–10.5)
nRBC: 0 % (ref 0.0–0.2)

## 2019-04-22 LAB — MAGNESIUM: Magnesium: 1.7 mg/dL (ref 1.7–2.4)

## 2019-04-22 MED ORDER — POTASSIUM CHLORIDE CRYS ER 20 MEQ PO TBCR
40.0000 meq | EXTENDED_RELEASE_TABLET | ORAL | Status: AC
  Administered 2019-04-22 (×2): 40 meq via ORAL
  Filled 2019-04-22: qty 2

## 2019-04-22 MED ORDER — POTASSIUM CHLORIDE IN NACL 40-0.9 MEQ/L-% IV SOLN
INTRAVENOUS | Status: DC
  Administered 2019-04-22 – 2019-04-23 (×2): 75 mL/h via INTRAVENOUS
  Filled 2019-04-22 (×3): qty 1000

## 2019-04-22 NOTE — Progress Notes (Signed)
OT Cancellation Note  Patient Details Name: Jose Baldwin MRN: [id number] DOB: Aug 07, 1929   Cancelled Treatment:    Reason Eval/Treat Not Completed: Other (comment). Pt  current D/C plan is to return to SNF. No apparent immediate acute care OT needs, therefore will defer OT to SNF. If OT eval is needed please call Acute Rehab Dept. at [phone] or text page OT at [phone].   Ramond Dial, OT/L   Acute OT Clinical Specialist Acute Rehabilitation Services Pager [phone] Office [phone]  04/22/2019, 4:12 PM

## 2019-04-22 NOTE — Evaluation (Signed)
Physical Therapy Evaluation Patient Details Name: Jose Baldwin MRN: [id number] DOB: 1929-07-22 Today's Date: 04/22/2019   History of Present Illness  Pt is an 85 y/o male admitted from SNF secondary to hypokalemia. PMH includes Parkinson's disease, prostate cancer, CVA, and HTN.   Clinical Impression  Pt admitted secondary to problem above with deficits below. Pt requiring mod to max A +2 for mobility tasks this session. Pt with very slowed processing and required extended time to respond to cues. Recommend return to SNF at d/c. Will continue to follow acutely to maximize functional mobility independence and safety.     Follow Up Recommendations SNF;Supervision/Assistance - 24 hour    Equipment Recommendations  None recommended by PT    Recommendations for Other Services       Precautions / Restrictions Precautions Precautions: Fall Restrictions Weight Bearing Restrictions: No      Mobility  Bed Mobility Overal bed mobility: Needs Assistance Bed Mobility: Supine to Sit;Sit to Supine     Supine to sit: Mod assist Sit to supine: Mod assist;Max assist;+2 for physical assistance   General bed mobility comments: Required assist for LE assist and trunk elevation. Also required assist to scoot hips to EOB. Required increased time to follow commands to assist with bed mobiltiy tasks.   Transfers Overall transfer level: Needs assistance Equipment used: Rolling walker (2 wheeled) Transfers: Sit to/from Stand Sit to Stand: Mod assist;+2 physical assistance         General transfer comment: Mod A +2 for lift assist and steadying to stand.   Ambulation/Gait Ambulation/Gait assistance: Min assist;+2 physical assistance   Assistive device: Rolling walker (2 wheeled)   Gait velocity: Decreased   General Gait Details: Able to take side steps at EOB with increased time and cues.   Stairs            Wheelchair Mobility    Modified Rankin (Stroke Patients Only)        Balance Overall balance assessment: Needs assistance Sitting-balance support: No upper extremity supported;Feet supported Sitting balance-Leahy Scale: Fair     Standing balance support: Bilateral upper extremity supported Standing balance-Leahy Scale: Poor Standing balance comment: Reliant on UE and external support                              Pertinent Vitals/Pain Pain Assessment: Faces Faces Pain Scale: No hurt    Home Living Family/patient expects to be discharged to:: Skilled nursing facility                      Prior Function Level of Independence: Needs assistance   Gait / Transfers Assistance Needed: Reports he uses RW for gait. Assume he requires assist from staff  ADL's / Homemaking Assistance Needed: Assume assist required for ADLs from staff. Pt unable to report        Hand Dominance        Extremity/Trunk Assessment   Upper Extremity Assessment Upper Extremity Assessment: Defer to OT evaluation    Lower Extremity Assessment Lower Extremity Assessment: Generalized weakness    Cervical / Trunk Assessment Cervical / Trunk Assessment: Kyphotic  Communication   Communication: Other (comment)(slow response time)  Cognition Arousal/Alertness: Awake/alert Behavior During Therapy: Flat affect Overall Cognitive Status: No family/caregiver present to determine baseline cognitive functioning  General Comments: pt with flat affect, awake, very limited verbalization throughout session despite questions asked of him. Required increased time to follow simple commands      General Comments      Exercises     Assessment/Plan    PT Assessment Patient needs continued PT services  PT Problem List Decreased strength;Decreased range of motion;Decreased activity tolerance;Decreased balance;Decreased mobility;Decreased coordination;Pain;Decreased cognition;Decreased knowledge of  precautions;Decreased safety awareness;Decreased knowledge of use of DME       PT Treatment Interventions DME instruction;Gait training;Functional mobility training;Therapeutic activities;Therapeutic exercise;Balance training;Cognitive remediation;Patient/family education    PT Goals (Current goals can be found in the Care Plan section)  Acute Rehab PT Goals PT Goal Formulation: Patient unable to participate in goal setting Time For Goal Achievement: 05/06/19 Potential to Achieve Goals: Fair    Frequency Min 2X/week   Barriers to discharge        Co-evaluation               AM-PAC PT "6 Clicks" Mobility  Outcome Measure Help needed turning from your back to your side while in a flat bed without using bedrails?: A Lot Help needed moving from lying on your back to sitting on the side of a flat bed without using bedrails?: A Lot Help needed moving to and from a bed to a chair (including a wheelchair)?: A Lot Help needed standing up from a chair using your arms (e.g., wheelchair or bedside chair)?: A Lot Help needed to walk in hospital room?: Total Help needed climbing 3-5 steps with a railing? : Total 6 Click Score: 10    End of Session Equipment Utilized During Treatment: Gait belt Activity Tolerance: Patient tolerated treatment well Patient left: in bed;with call bell/phone within reach;with bed alarm set Nurse Communication: Mobility status PT Visit Diagnosis: Unsteadiness on feet (R26.81);Other abnormalities of gait and mobility (R26.89);Muscle weakness (generalized) (M62.81);Difficulty in walking, not elsewhere classified (R26.2)    Time: 9379-0240 PT Time Calculation (min) (ACUTE ONLY): 23 min   Charges:   PT Evaluation $PT Eval Moderate Complexity: 1 Mod PT Treatments $Therapeutic Activity: 8-22 mins        Leighton Ruff, PT, DPT  Acute Rehabilitation Services  Pager: [phone] Office: [phone]   Rudean Hitt 04/22/2019, 4:34  PM

## 2019-04-22 NOTE — Progress Notes (Signed)
Informed by CCMD that patient has elevated ST of 4.4 Patient asleep at this time,easily arousable,denies any c/o. BP 166/76 HR 72. Schorr,NP text paged, order received to do EKG. Will continue to monitor. Yianni Skilling, Wonda Cheng, Therapist, sports

## 2019-04-22 NOTE — Progress Notes (Signed)
Patient ID: Jose Baldwin, male   DOB: 12/28/1928, 85 y.o.   MRN: [id number]  PROGRESS NOTE    Jose Baldwin  HUT:[id number] DOB: August 28, 1929 DOA: 04/20/2019 PCP: Billie Ruddy, MD   Brief Narrative:  85 year old male with history of hypertension, prostate cancer, Parkinson's disease, falls, prior CVA came into the hospital from his SNF due to abnormal blood work.  He was told that his potassium was low and was directed to the hospital.  He was admitted for hypokalemia and acute kidney injury on chronic kidney disease stage III and was started on intravenous fluids.  Assessment & Plan:   Acute kidney injury on chronic renal disease stage III -Creatinine 2.17 on admission, increased from 1.2 last month -Probably from intravascular depletion -Treated with IV fluids.  Creatinine still at 1.84 today.  Oral intake still inadequate.  Will restart IV fluids with intravenous potassium supplementation.  Monitor creatinine.  Hypokalemia -He is on fludrocortisone which can cause hypokalemia. -Replace.  Repeat a.m. labs  Hypomagnesemia -improving  Parkinson's disease -Continue Sinemet  History of recent CVA -Continue aspirin and Plavix for 3 weeks then aspirin alone from the time of stroke, which happened during the last hospitalization.  He has been 3 weeks since his last hospitalization.  Will DC Plavix and continue aspirin alone.  Outpatient follow-up with neurology.  Transient?  Arrhythmias -Rate controlled.  Continue Cardizem.  There is a question about A. fib in the past but currently in sinus rhythm with PACs.  Outpatient follow-up with cardiology  Hypertension -Continue clonidine and diltiazem  Postural tachycardia syndrome -Continue fludrocortisone  Prostate cancer -Outpatient follow-up   Generalized deconditioning--PT evaluation.  Overall prognosis guarded.  Still full code, will request palliative care evaluation for goals of care discussion.    DVT prophylaxis:  Heparin Code Status: Full Family Communication: None at bedside Disposition Plan: Back to SNF once medically stable, in 1 to 2 days  Consultants: None  Procedures: None  Antimicrobials: None   Subjective: Patient seen and examined at bedside.  He is a poor historian.  Feels slightly better.  Still feels weak.  Appetite slightly improving.  No overnight fever or vomiting.  Objective: Vitals:   04/21/19 2001 04/22/19 0015 04/22/19 0435 04/22/19 0901  BP: (!) 177/82 (!) 166/76 (!) 163/77 (!) 146/66  Pulse: 74 72 92 70  Resp: (!) 21 17 19 18   Temp: 98.3 F (36.8 C)  98.7 F (37.1 C) 98.7 F (37.1 C)  TempSrc: Oral  Oral Oral  SpO2: 95% 94% 92% 94%  Weight: 60.2 kg     Height:        Intake/Output Summary (Last 24 hours) at 04/22/2019 1145 Last data filed at 04/22/2019 0900 Gross per 24 hour  Intake 1736.49 ml  Output 5025 ml  Net -3288.51 ml   Filed Weights   04/20/19 1644 04/20/19 2253 04/21/19 2001  Weight: 54.4 kg 55 kg 60.2 kg    Examination:  General exam: Appears calm and comfortable.  Elderly male.  Lying in bed.  No distress.  Poor historian. Respiratory system: Bilateral decreased breath sounds at bases Cardiovascular system: S1 & S2 heard, Rate controlled Gastrointestinal system: Abdomen is nondistended, soft and nontender. Normal bowel sounds heard. Extremities: No cyanosis, clubbing, edema    Data Reviewed: I have personally reviewed following labs and imaging studies  CBC: Recent Labs  Lab 04/20/19 1701 04/21/19 0554 04/22/19 0441  WBC 8.5 6.5 8.0  NEUTROABS 6.0  --   --   HGB  9.6* 7.8* 8.4*  HCT 29.9* 23.7* 25.9*  MCV 92.6 91.5 92.2  PLT 191 148* 053   Basic Metabolic Panel: Recent Labs  Lab 04/20/19 1701 04/20/19 1712 04/21/19 0554 04/21/19 1458 04/22/19 0441  NA 143  --  142 139 142  K 2.4*  --  2.3* 2.6* 3.0*  CL 99  --  104 102 104  CO2 30  --  29 28 29   GLUCOSE 103*  --  94 130* 104*  BUN 29*  --  24* 25* 28*  CREATININE  2.17*  --  1.85* 1.97* 1.84*  CALCIUM 8.3*  --  7.7* 7.5* 7.9*  MG  --  1.6* 1.6*  --  1.7   GFR: Estimated Creatinine Clearance: 23.6 mL/min (A) (by C-G formula based on SCr of 1.84 mg/dL (H)). Liver Function Tests: Recent Labs  Lab 04/20/19 1701  AST 35  ALT 17  ALKPHOS 80  BILITOT 0.5  PROT 6.3*  ALBUMIN 3.3*   No results for input(s): LIPASE, AMYLASE in the last 168 hours. No results for input(s): AMMONIA in the last 168 hours. Coagulation Profile: No results for input(s): INR, PROTIME in the last 168 hours. Cardiac Enzymes: No results for input(s): CKTOTAL, CKMB, CKMBINDEX, TROPONINI in the last 168 hours. BNP (last 3 results) No results for input(s): PROBNP in the last 8760 hours. HbA1C: No results for input(s): HGBA1C in the last 72 hours. CBG: No results for input(s): GLUCAP in the last 168 hours. Lipid Profile: No results for input(s): CHOL, HDL, LDLCALC, TRIG, CHOLHDL, LDLDIRECT in the last 72 hours. Thyroid Function Tests: No results for input(s): TSH, T4TOTAL, FREET4, T3FREE, THYROIDAB in the last 72 hours. Anemia Panel: No results for input(s): VITAMINB12, FOLATE, FERRITIN, TIBC, IRON, RETICCTPCT in the last 72 hours. Sepsis Labs: No results for input(s): PROCALCITON, LATICACIDVEN in the last 168 hours.  Recent Results (from the past 240 hour(s))  SARS CORONAVIRUS 2 Nasal Swab Aptima Multi Swab     Status: None   Collection Time: 04/20/19  8:21 PM   Specimen: Aptima Multi Swab; Nasal Swab  Result Value Ref Range Status   SARS Coronavirus 2 NEGATIVE NEGATIVE Final    Comment: (NOTE) SARS-CoV-2 target nucleic acids are NOT DETECTED. The SARS-CoV-2 RNA is generally detectable in upper and lower respiratory specimens during the acute phase of infection. Negative results do not preclude SARS-CoV-2 infection, do not rule out co-infections with other pathogens, and should not be used as the sole basis for treatment or other patient management  decisions. Negative results must be combined with clinical observations, patient history, and epidemiological information. The expected result is Negative. Fact Sheet for Patients: SugarRoll.be Fact Sheet for Healthcare Providers: https://www.woods-mathews.com/ This test is not yet approved or cleared by the [place] FDA and  has been authorized for detection and/or diagnosis of SARS-CoV-2 by FDA under an Emergency Use Authorization (EUA). This EUA will remain  in effect (meaning this test can be used) for the duration of the COVID-19 declaration under Section 56 4(b)(1) of the Act, 21 U.S.C. section 360bbb-3(b)(1), unless the authorization is terminated or revoked sooner. Performed at [facility], [place] [address], [place], [place] [address]          Radiology Studies: US Renal  Result Date: 04/20/2019 CLINICAL DATA:  Acute renal failure EXAM: RENAL / URINARY TRACT ULTRASOUND COMPLETE COMPARISON:  None. FINDINGS: Right Kidney: Renal measurements: 11.5 x 4.9 x 5.9 cm = volume: 173.3 mL. Increased cortical echogenicity compatible with medical  renal disease. No concerning mass or hydronephrosis. Left Kidney: Difficult visualization due to poor sonographic window. Renal measurements: 10.2 x 5.5 x 5.8 cm = volume: 168.6 mL. Increased cortical echogenicity compatible with medical renal disease. No concerning mass or hydronephrosis. Bladder: Appears normal for degree of bladder distention. Both bladder jets visualized. IMPRESSION: Increased cortical echogenicity of both kidneys compatible with medical renal disease. No other abnormalities in the kidneys or bladder. Electronically Signed   By: Lovena Le M.D.   On: 04/20/2019 22:10        Scheduled Meds: . aspirin  81 mg Oral Daily  . carbidopa-levodopa  1 tablet Oral TID  . cloNIDine  0.2 mg Oral TID  . clopidogrel  75 mg Oral Daily  . diltiazem  300 mg Oral QHS  . feeding  supplement (ENSURE ENLIVE)  237 mL Oral BID BM  . fludrocortisone  0.2 mg Oral QHS  . heparin  5,000 Units Subcutaneous Q8H  . potassium chloride  40 mEq Oral Q4H  . rosuvastatin  40 mg Oral q1800  . sodium chloride flush  3 mL Intravenous Q12H  . thiamine  100 mg Oral QHS   Continuous Infusions:   LOS: 1 day        Aline August, MD [facility] 04/22/2019, 11:45 AM

## 2019-04-23 LAB — BASIC METABOLIC PANEL
Anion gap: 7 (ref 5–15)
BUN: 28 mg/dL — ABNORMAL HIGH (ref 8–23)
CO2: 26 mmol/L (ref 22–32)
Calcium: 7.9 mg/dL — ABNORMAL LOW (ref 8.9–10.3)
Chloride: 110 mmol/L (ref 98–111)
Creatinine, Ser: 1.73 mg/dL — ABNORMAL HIGH (ref 0.61–1.24)
GFR calc Af Amer: 40 mL/min — ABNORMAL LOW (ref >60)
GFR calc non Af Amer: 34 mL/min — ABNORMAL LOW (ref >60)
Glucose, Bld: 102 mg/dL — ABNORMAL HIGH (ref 70–99)
Potassium: 4 mmol/L (ref 3.5–5.1)
Sodium: 143 mmol/L (ref 135–145)

## 2019-04-23 LAB — CBC WITH DIFFERENTIAL/PLATELET
Abs Immature Granulocytes: 0.02 10*3/uL (ref 0.00–0.07)
Basophils Absolute: 0 10*3/uL (ref 0.0–0.1)
Basophils Relative: 0 %
Eosinophils Absolute: 0.2 10*3/uL (ref 0.0–0.5)
Eosinophils Relative: 3 %
HCT: 23.3 % — ABNORMAL LOW (ref 39.0–52.0)
Hemoglobin: 7.6 g/dL — ABNORMAL LOW (ref 13.0–17.0)
Immature Granulocytes: 0 %
Lymphocytes Relative: 19 %
Lymphs Abs: 1.2 10*3/uL (ref 0.7–4.0)
MCH: 30.4 pg (ref 26.0–34.0)
MCHC: 32.6 g/dL (ref 30.0–36.0)
MCV: 93.2 fL (ref 80.0–100.0)
Monocytes Absolute: 0.6 10*3/uL (ref 0.1–1.0)
Monocytes Relative: 10 %
Neutro Abs: 4.4 10*3/uL (ref 1.7–7.7)
Neutrophils Relative %: 68 %
Platelets: 136 10*3/uL — ABNORMAL LOW (ref 150–400)
RBC: 2.5 MIL/uL — ABNORMAL LOW (ref 4.22–5.81)
RDW: 15.5 % (ref 11.5–15.5)
WBC: 6.5 10*3/uL (ref 4.0–10.5)
nRBC: 0 % (ref 0.0–0.2)

## 2019-04-23 LAB — MAGNESIUM: Magnesium: 1.7 mg/dL (ref 1.7–2.4)

## 2019-04-23 MED ORDER — ROSUVASTATIN CALCIUM 40 MG PO TABS: 40.0000 mg | ORAL_TABLET | Freq: Every day | ORAL | 0 refills | Status: DC

## 2019-04-23 MED ORDER — POLYETHYLENE GLYCOL 3350 17 G PO PACK: 17.0000 g | PACK | Freq: Every day | ORAL | 0 refills | Status: AC | PRN

## 2019-04-23 MED ORDER — POTASSIUM CHLORIDE CRYS ER 20 MEQ PO TBCR: 40.0000 meq | EXTENDED_RELEASE_TABLET | Freq: Every day | ORAL | 0 refills | Status: DC

## 2019-04-23 MED ORDER — ONDANSETRON HCL 4 MG PO TABS: 4.0000 mg | ORAL_TABLET | Freq: Four times a day (QID) | ORAL | 0 refills | Status: DC | PRN

## 2019-04-23 MED ORDER — KETOCONAZOLE 2 % EX CREA: 1.0000 "application " | TOPICAL_CREAM | Freq: Every day | CUTANEOUS | Status: DC | PRN

## 2019-04-23 NOTE — TOC Transition Note (Signed)
Transition of Care [facility]) - CM/SW Discharge Note   Patient Details  Name: Jose Baldwin MRN: [id number] Date of Birth: 1929/02/23  [facility] CM/SW Contact:  Bartholomew Crews, RN Phone Number: [phone] 04/23/2019, 1:44 PM   Clinical Narrative:    Patient to transition home today. Attempt to contact son, [name] Dike, x 2, but no answer and vm full. Son did call back in response from missed call. Advised of patient transition home today. Son did not recall active [facility] agency - noted in previous encounter that patient was sent up with [facility] [phone]) - call made to advise of transition home. Fax number received [phone]. Faxed orders and dc summary. Son verbalized that patient also gets a [place] aide through [place] - advised that [facility] but son adamantly refused stating that CM may mess things up and [facility] may start taking things away. [facility] stated if [facility] needs any information, he will send it to them. Son will pick patient up at 3pm - RN aware. No further CM needs identified.    Final next level of care: [place] Barriers to Discharge: No Barriers Identified   Patient Goals and CMS Choice Patient states their goals for this hospitalization and ongoing recovery are:: return home CMS Medicare.gov Compare Post Acute Care list provided to:: Patient Choice offered to / list presented to : Patient, Adult Children  Discharge Placement                       Discharge Plan and Services                DME Arranged: N/A DME Agency: NA       HH Arranged: RN, PT, OT [place] Agency: Other - See comment([facility]) Date [place]: 04/23/19 Time [place]: 1103 Representative spoke with at [place]: [place] ([place]) Interventions     Readmission Risk Interventions No flowsheet data found.

## 2019-04-23 NOTE — Discharge Summary (Signed)
Physician Discharge Summary  Jose Baldwin:[id number] DOB: [birth year] DOA: 04/20/2019  PCP: Billie Ruddy, MD  Admit date: 04/20/2019 Discharge date: 04/23/2019  Admitted From: Home Disposition: Home  Recommendations for Outpatient Follow-up:  1. Follow up with PCP in 1 week with repeat CBC/BMP 2. Follow up in ED if symptoms worsen or new appear   Home Health: No Equipment/Devices: None  Discharge Condition: Guarded CODE STATUS: DNR.  Confirmed with son on phone Diet recommendation: Heart healthy  Brief/Interim Summary: 85 year old male with history of hypertension, prostate cancer, Parkinson's disease, falls, prior CVA came into the hospital from his SNF due to abnormal blood work. He was told that his potassium was low and was directed to the hospital.  He was admitted for hypokalemia and acute kidney injury on chronic kidney disease stage III and was started on intravenous fluids.  His kidney function is improving.  His potassium has improved.  PT recommended SNF placement.  Son wanted him to come home with home health.  He will be discharged home with home health today.  Discharge Diagnoses:   Acute kidney injury on chronic renal disease stage III -Creatinine 2.17 on admission, increased from 1.2 last month -Probably from intravascular depletion -Treated with IV fluids.  Creatinine at 1.73 today.  Oral intake improving.  -We will need outpatient follow-up of BMP within a week.  Hypokalemia -He is on fludrocortisone which can cause hypokalemia. -Replaced.  Improved.  Will discharge on K Dur 40 M EQ daily for now till reevaluation by PCP with repeat BMP within a week  Hypomagnesemia -improved  Parkinson's disease -Continue Sinemet  History of recent CVA -Continue aspirin and Plavix for 3 weeks then aspirin alone from the time of stroke, which happened during the last hospitalization.  He has been 3 weeks since his last hospitalization.  Will DC Plavix and continue  aspirin alone.  Outpatient follow-up with neurology.  Transient?  Arrhythmias -Rate controlled.  Continue Cardizem.  There is a question about A. fib in the past but currently in sinus rhythm with PACs.  Outpatient follow-up with cardiology  Hypertension -Continue clonidine and diltiazem  Postural tachycardia syndrome -Continue fludrocortisone  Prostate cancer -Outpatient follow-up   Generalized deconditioning--PT recommended SNF.  Overall prognosis guarded. Son wanted him to come home with home health.  He will be discharged home with home health today.  Spoke to son on phone today who confirmed that patient is a DNR.  Recommend outpatient palliative care evaluation and follow-up.     Discharge Instructions  Discharge Instructions    Diet - low sodium heart healthy   Complete by: As directed    Increase activity slowly   Complete by: As directed      Allergies as of 04/23/2019   No Known Allergies     Medication List    STOP taking these medications   clopidogrel 75 MG tablet Commonly known as: PLAVIX   QUEtiapine 25 MG tablet Commonly known as: SEROQUEL     TAKE these medications   aspirin 81 MG chewable tablet Chew 1 tablet (81 mg total) by mouth daily.   carbidopa-levodopa 25-100 MG tablet Commonly known as: SINEMET IR Take 1 tablet by mouth 3 (three) times daily. What changed:   when to take this  additional instructions   cloNIDine 0.2 MG tablet Commonly known as: CATAPRES Take 0.2 mg by mouth See admin instructions. Take 0.2 mg by mouth three times a day (8 AM, 12 NOON, and 7 PM)  diltiazem 300 MG 24 hr capsule Commonly known as: CARDIZEM CD Take 300 mg by mouth at bedtime.   fludrocortisone 0.1 MG tablet Commonly known as: FLORINEF Take 2 tablets (0.2 mg total) by mouth at bedtime.   ketoconazole 2 % cream Commonly known as: NIZORAL Apply 1 application topically daily as needed (facial irritation).   ondansetron 4 MG  tablet Commonly known as: ZOFRAN Take 1 tablet (4 mg total) by mouth every 6 (six) hours as needed for nausea.   polyethylene glycol 17 g packet Commonly known as: MIRALAX / GLYCOLAX Take 17 g by mouth daily as needed for mild constipation.   potassium chloride SA 20 MEQ tablet Commonly known as: K-DUR Take 2 tablets (40 mEq total) by mouth daily.   ranibizumab 0.5 MG/0.05ML Soln Commonly known as: LUCENTIS 0.5 mg by Intravitreal route every 8 (eight) weeks.   rosuvastatin 40 MG tablet Commonly known as: CRESTOR Take 1 tablet (40 mg total) by mouth daily at 6 PM.   thiamine 100 MG tablet Commonly known as: VITAMIN B-1 Take 100 mg by mouth at bedtime.      Follow-up Information    Billie Ruddy, MD. Schedule an appointment as soon as possible for a visit in 1 week(s).   Specialty: Family Medicine Why: With repeat CBC/BMP Contact information: [place] [address] [phone]        Minus Breeding, MD .   Specialty: Cardiology Contact information: [address] [place] [address] [phone]          No Known Allergies  Consultations:  None   Procedures/Studies: Ct Angio Head W Or Wo Contrast  Result Date: 03/29/2019 CLINICAL DATA:  Stroke follow-up. Acute and subacute right cerebral hemispheric infarcts on MRI, most notable in the basal ganglia. EXAM: CT ANGIOGRAPHY HEAD AND NECK TECHNIQUE: Multidetector CT imaging of the head and neck was performed using the standard protocol during bolus administration of intravenous contrast. Multiplanar CT image reconstructions and MIPs were obtained to evaluate the vascular anatomy. Carotid stenosis measurements (when applicable) are obtained utilizing NASCET criteria, using the distal internal carotid diameter as the denominator. CONTRAST:  122mL OMNIPAQUE IOHEXOL 350 MG/ML SOLN COMPARISON:  Brain MRI 03/29/2019.  No prior angiographic imaging. FINDINGS: CT HEAD FINDINGS  Brain: No acute intracranial hemorrhage, mass, midline shift, or extra-axial fluid collection is identified. Rounded hypodensity at the genu of the right internal capsule corresponds to the acute infarct on MRI. Multiple chronic infarcts are present in the basal ganglia bilaterally. Cerebral white matter hypodensities elsewhere bilaterally are nonspecific but compatible with moderate chronic small vessel ischemic disease. There is mild cerebral atrophy. Vascular: No hyperdense vessel. Skull: No fracture or focal osseous lesion. Sinuses: Paranasal sinuses and mastoid air cells are clear. Orbits: Bilateral cataract extraction. Review of the MIP images confirms the above findings CTA NECK FINDINGS Aortic arch: Incomplete imaging of the aortic arch with exclusion of the brachiocephalic and left common carotid artery origins. Moderate calcified atherosclerosis in the visualized arch without significant left subclavian artery origin stenosis. Right carotid system: Patent with moderately extensive, predominantly calcified plaque throughout the common carotid artery not resulting in significant stenosis. Prominent calcified plaque in the proximal ICA results in 50% stenosis. Left carotid system: Patent with scattered, predominantly calcified plaque in the common carotid artery not resulting in stenosis. Calcified plaque in the proximal ICA results in a focal high-grade stenosis estimated at 85%. The more distal cervical ICA is widely patent and of normal caliber.  Vertebral arteries: The vertebral arteries are patent with the left being strongly dominant. No significant stenosis or evidence of dissection. Skeleton: Advanced cervical disc and facet degeneration. Interbody and facet ankylosis at C4-5. Mild multilevel degenerative listhesis. Mild anterior compression deformities of the T1 and T2 vertebral bodies, also present on a 01/21/2019 cervical spine MRI. Other neck: No evidence of cervical lymphadenopathy or mass. Upper  chest: Mild emphysematous changes in the lung apices. Review of the MIP images confirms the above findings CTA HEAD FINDINGS Anterior circulation: The internal carotid arteries are patent from skull base to carotid termini with mild atherosclerotic plaque bilaterally not resulting in significant stenosis. ACAs and MCAs are patent with mild branch vessel irregularity but no evidence of proximal branch occlusion or significant proximal stenosis. No aneurysm is identified. Posterior circulation: The intracranial vertebral arteries are patent to the basilar with the left being strongly dominant. Patent AICA and SCA origins are visualized bilaterally. The basilar artery is widely patent. There are left larger than right posterior communicating arteries. Both PCAs are patent with mild branch vessel irregularity but no evidence of significant proximal stenosis. No aneurysm is identified. Venous sinuses: Patent. Anatomic variants: None. Review of the MIP images confirms the above findings IMPRESSION: 1. High-grade proximal left ICA stenosis due to calcified plaque, estimated at 85%. 2. 50% proximal right ICA stenosis. 3. Patent vertebral arteries with the left being strongly dominant. No significant stenosis. 4. Mild intracranial atherosclerosis without major branch occlusion or significant proximal stenosis. 5.  Aortic Atherosclerosis (ICD10-I70.0). Electronically Signed   By: Logan Bores M.D.   On: 03/29/2019 16:25   Ct Angio Neck W Or Wo Contrast  Result Date: 03/29/2019 CLINICAL DATA:  Stroke follow-up. Acute and subacute right cerebral hemispheric infarcts on MRI, most notable in the basal ganglia. EXAM: CT ANGIOGRAPHY HEAD AND NECK TECHNIQUE: Multidetector CT imaging of the head and neck was performed using the standard protocol during bolus administration of intravenous contrast. Multiplanar CT image reconstructions and MIPs were obtained to evaluate the vascular anatomy. Carotid stenosis measurements (when  applicable) are obtained utilizing NASCET criteria, using the distal internal carotid diameter as the denominator. CONTRAST:  144mL OMNIPAQUE IOHEXOL 350 MG/ML SOLN COMPARISON:  Brain MRI 03/29/2019.  No prior angiographic imaging. FINDINGS: CT HEAD FINDINGS Brain: No acute intracranial hemorrhage, mass, midline shift, or extra-axial fluid collection is identified. Rounded hypodensity at the genu of the right internal capsule corresponds to the acute infarct on MRI. Multiple chronic infarcts are present in the basal ganglia bilaterally. Cerebral white matter hypodensities elsewhere bilaterally are nonspecific but compatible with moderate chronic small vessel ischemic disease. There is mild cerebral atrophy. Vascular: No hyperdense vessel. Skull: No fracture or focal osseous lesion. Sinuses: Paranasal sinuses and mastoid air cells are clear. Orbits: Bilateral cataract extraction. Review of the MIP images confirms the above findings CTA NECK FINDINGS Aortic arch: Incomplete imaging of the aortic arch with exclusion of the brachiocephalic and left common carotid artery origins. Moderate calcified atherosclerosis in the visualized arch without significant left subclavian artery origin stenosis. Right carotid system: Patent with moderately extensive, predominantly calcified plaque throughout the common carotid artery not resulting in significant stenosis. Prominent calcified plaque in the proximal ICA results in 50% stenosis. Left carotid system: Patent with scattered, predominantly calcified plaque in the common carotid artery not resulting in stenosis. Calcified plaque in the proximal ICA results in a focal high-grade stenosis estimated at 85%. The more distal cervical ICA is widely patent and of normal caliber. Vertebral arteries:  The vertebral arteries are patent with the left being strongly dominant. No significant stenosis or evidence of dissection. Skeleton: Advanced cervical disc and facet degeneration. Interbody  and facet ankylosis at C4-5. Mild multilevel degenerative listhesis. Mild anterior compression deformities of the T1 and T2 vertebral bodies, also present on a 01/21/2019 cervical spine MRI. Other neck: No evidence of cervical lymphadenopathy or mass. Upper chest: Mild emphysematous changes in the lung apices. Review of the MIP images confirms the above findings CTA HEAD FINDINGS Anterior circulation: The internal carotid arteries are patent from skull base to carotid termini with mild atherosclerotic plaque bilaterally not resulting in significant stenosis. ACAs and MCAs are patent with mild branch vessel irregularity but no evidence of proximal branch occlusion or significant proximal stenosis. No aneurysm is identified. Posterior circulation: The intracranial vertebral arteries are patent to the basilar with the left being strongly dominant. Patent AICA and SCA origins are visualized bilaterally. The basilar artery is widely patent. There are left larger than right posterior communicating arteries. Both PCAs are patent with mild branch vessel irregularity but no evidence of significant proximal stenosis. No aneurysm is identified. Venous sinuses: Patent. Anatomic variants: None. Review of the MIP images confirms the above findings IMPRESSION: 1. High-grade proximal left ICA stenosis due to calcified plaque, estimated at 85%. 2. 50% proximal right ICA stenosis. 3. Patent vertebral arteries with the left being strongly dominant. No significant stenosis. 4. Mild intracranial atherosclerosis without major branch occlusion or significant proximal stenosis. 5.  Aortic Atherosclerosis (ICD10-I70.0). Electronically Signed   By: Logan Bores M.D.   On: 03/29/2019 16:25   Mr Brain Wo Contrast  Result Date: 03/29/2019 CLINICAL DATA:  Initial evaluation for possible syncope versus seizure, presumed history of Parkinson's disease. EXAM: MRI HEAD WITHOUT CONTRAST TECHNIQUE: Multiplanar, multiecho pulse sequences of the  brain and surrounding structures were obtained without intravenous contrast. COMPARISON:  None available. FINDINGS: Brain: Diffuse prominence of the CSF containing spaces compatible with generalized age-related cerebral atrophy. Patchy and confluent T2/FLAIR hyperintensity within the periventricular and deep white matter both cerebral hemispheres most consistent with chronic microvascular ischemic disease, moderate in nature. Scattered superimposed remote lacunar infarcts present within the bilateral basal ganglia. Associated chronic hemosiderin staining about several of these infarcts. 13 mm focus of restricted diffusion seen involving the right lentiform nucleus/genu of the internal capsule (series 5, image 72). Additional patchy involvement of the adjacent right caudate, external capsule, and subinsular white matter (series 5, image 70). Probable mild petechial hemorrhage at the level of the internal capsule (series 14, image 32). No significant mass effect. 13 mm focus of vague diffusion abnormality involving the right posterior periatrial white matter without ADC correlate likely reflects the sequelae of subacute small vessel ischemia (series 5, image 72). Additional similar focus seen involving the high subcortical right frontal lobe (series 5, image 83). No other evidence for acute or subacute ischemia. Gray-white matter differentiation otherwise maintained. No other areas of remote cortical infarction. No other evidence for acute or chronic intracranial hemorrhage. No mass lesion, midline shift or mass effect. Mild diffuse ventricular prominence related to global parenchymal volume loss without hydrocephalus. No extra-axial fluid collection. Pituitary gland suprasellar region normal. Midline structures intact and normal. No intrinsic temporal lobe abnormality. Evaluation of the pars compacta limited on this non high-resolution exam. Vascular: Major intracranial vascular flow voids are maintained. Skull and  upper cervical spine: Craniocervical junction within normal limits. Exaggeration of the normal cervical lordosis noted. Bone marrow signal intensity within normal limits. 14 mm  well-circumscribed T2 hyperintense cystic lesion within the subcutaneous fat of the suboccipital scalp noted, likely a sebaceous cyst. Scalp soft tissues demonstrate no acute finding. Sinuses/Orbits: Patient status post bilateral ocular lens replacement. Globes and orbital soft tissues demonstrate no acute finding. Paranasal sinuses are largely clear. Trace opacity bilateral mastoid air cells, of doubtful significance. Inner ear structures grossly normal. Other: None. IMPRESSION: 1. Patchy small volume acute ischemic infarcts involving the right basal ganglia, with extension into the right external capsule/sub insular white matter as above. Associated mild petechial hemorrhage without hemorrhagic transformation or significant mass effect. 2. Additional patchy small volume late subacute small vessel ischemic changes involving the right periatrial and right frontal subcortical white matter. 3. Underlying age-related cerebral atrophy with moderate chronic microvascular ischemic disease, with multiple remote lacunar infarcts involving the basal ganglia bilaterally. Electronically Signed   By: Jeannine Boga M.D.   On: 03/29/2019 04:08   US Renal  Result Date: 04/20/2019 CLINICAL DATA:  Acute renal failure EXAM: RENAL / URINARY TRACT ULTRASOUND COMPLETE COMPARISON:  None. FINDINGS: Right Kidney: Renal measurements: 11.5 x 4.9 x 5.9 cm = volume: 173.3 mL. Increased cortical echogenicity compatible with medical renal disease. No concerning mass or hydronephrosis. Left Kidney: Difficult visualization due to poor sonographic window. Renal measurements: 10.2 x 5.5 x 5.8 cm = volume: 168.6 mL. Increased cortical echogenicity compatible with medical renal disease. No concerning mass or hydronephrosis. Bladder: Appears normal for degree of  bladder distention. Both bladder jets visualized. IMPRESSION: Increased cortical echogenicity of both kidneys compatible with medical renal disease. No other abnormalities in the kidneys or bladder. Electronically Signed   By: Lovena Le M.D.   On: 04/20/2019 22:10   Ir Vertebroplasty Cerv/thor Bx Inc Uni/bil Inc/inject/imaging  Result Date: 03/26/2019 INDICATION: Severe pain in lumbar region due to compression fracture at T12. EXAM: VERTEBROPLASTY AT T12 MEDICATIONS: As antibiotic prophylaxis, 2 g Ancef IV was ordered pre-procedure and administered intravenously within 1 hour of incision. ANESTHESIA/SEDATION: Moderate (conscious) sedation was employed during this procedure. A total of Versed 1 mg and Fentanyl 5 mcg was administered intravenously. Moderate Sedation Time: 24 minutes. The patient's level of consciousness and vital signs were monitored continuously by radiology nursing throughout the procedure under my direct supervision. FLUOROSCOPY TIME:  Fluoroscopy Time: 8 minutes 24 seconds (831 mGy) COMPLICATIONS: None immediate. TECHNIQUE: Informed written consent was obtained from the patient after a thorough discussion of the procedural risks, benefits and alternatives. All questions were addressed. Maximal Sterile Barrier Technique was utilized including caps, mask, sterile gowns, sterile gloves, sterile drape, hand hygiene and skin antiseptic. A timeout was performed prior to the initiation of the procedure. PROCEDURE: The patient was placed prone on the fluoroscopic table. Nasal oxygen was administered. Physiologic monitoring was performed throughout the duration of the procedure. The skin overlying the thoracolumbar region was prepped and draped in the usual sterile fashion. The T12 vertebral body was identified and the right pedicle was infiltrated with 0.25% Bupivacaine. This was then followed by the advancement of a 13-gauge Cook needle through the right pedicle into the posterior one-third at  T12. A 16 gauge core biopsy needle was then advanced to the anterior 1/3 at T12. Using a 20 mL syringe, core biopsies were obtained. Three passes were made. The 13 gauge Cook spinal needle was then advanced to the distal 1/3 at T12. At this time, methylmethacrylate mixture was reconstituted. Under biplane intermittent fluoroscopy, the methylmethacrylate was then injected into the T12 vertebral body with filling of the vertebral body.  No extravasation was noted into the disk spaces or posteriorly into the spinal canal. No epidural venous contamination was seen. The needle was then removed. Hemostasis was achieved at the skin entry site. There were no acute complications. Patient tolerated the procedure well. The patient was observed for 3 hours and discharged in good condition. IMPRESSION: 1. Status post vertebral body augmentation for painful compression fracture at T12 using vertebroplasty technique. 2. Core biopsies obtained were sent for pathologic analysis. The results of the biopsy to be sent to the referring MD. The son was advised to check with Dr. Rudene Anda office regarding the results of the biopsy. The son expressed understanding and agreement with the above plan. Electronically Signed   By: Luanne Bras M.D.   On: 03/24/2019 13:14       Subjective: Patient seen and examined at bedside.  He is a poor historian.  Feels slightly better.  Appetite slightly improving.  No overnight fever or vomiting.  Wants to go home today.  Discharge Exam: Vitals:   04/22/19 2110 04/23/19 0435  BP: (!) 152/65 (!) 145/56  Pulse: 71 (!) 54  Resp:  14  Temp:  98.5 F (36.9 C)  SpO2: 98% 94%     General exam: Appears calm and comfortable.  Elderly male.  Lying in bed.  No distress.  Poor historian.  Very thinly built. Respiratory system: Bilateral decreased breath sounds at bases Cardiovascular system: S1 & S2 heard, Rate controlled Gastrointestinal system: Abdomen is nondistended, soft and  nontender. Normal bowel sounds heard. Extremities: No cyanosis, clubbing, edema    The results of significant diagnostics from this hospitalization (including imaging, microbiology, ancillary and laboratory) are listed below for reference.     Microbiology: Recent Results (from the past 240 hour(s))  SARS CORONAVIRUS 2 Nasal Swab Aptima Multi Swab     Status: None   Collection Time: 04/20/19  8:21 PM   Specimen: Aptima Multi Swab; Nasal Swab  Result Value Ref Range Status   SARS Coronavirus 2 NEGATIVE NEGATIVE Final    Comment: (NOTE) SARS-CoV-2 target nucleic acids are NOT DETECTED. The SARS-CoV-2 RNA is generally detectable in upper and lower respiratory specimens during the acute phase of infection. Negative results do not preclude SARS-CoV-2 infection, do not rule out co-infections with other pathogens, and should not be used as the sole basis for treatment or other patient management decisions. Negative results must be combined with clinical observations, patient history, and epidemiological information. The expected result is Negative. Fact Sheet for Patients: SugarRoll.be Fact Sheet for Healthcare Providers: https://www.woods-mathews.com/ This test is not yet approved or cleared by the [place] FDA and  has been authorized for detection and/or diagnosis of SARS-CoV-2 by FDA under an Emergency Use Authorization (EUA). This EUA will remain  in effect (meaning this test can be used) for the duration of the COVID-19 declaration under Section 56 4(b)(1) of the Act, 21 U.S.C. section 360bbb-3(b)(1), unless the authorization is terminated or revoked sooner. Performed at [facility], [place] [address], [place], [place] [address]      Labs: BNP (last 3 results) No results for input(s): BNP in the last 8760 hours. Basic Metabolic Panel: Recent Labs  Lab 04/20/19 1701 04/20/19 1712 04/21/19 0554 04/21/19 1458  04/22/19 0441 04/23/19 0428  NA 143  --  142 139 142 143  K 2.4*  --  2.3* 2.6* 3.0* 4.0  CL 99  --  104 102 104 110  CO2 30  --  29 28 29  26  GLUCOSE 103*  --  94 130* 104* 102*  BUN 29*  --  24* 25* 28* 28*  CREATININE 2.17*  --  1.85* 1.97* 1.84* 1.73*  CALCIUM 8.3*  --  7.7* 7.5* 7.9* 7.9*  MG  --  1.6* 1.6*  --  1.7 1.7   Liver Function Tests: Recent Labs  Lab 04/20/19 1701  AST 35  ALT 17  ALKPHOS 80  BILITOT 0.5  PROT 6.3*  ALBUMIN 3.3*   No results for input(s): LIPASE, AMYLASE in the last 168 hours. No results for input(s): AMMONIA in the last 168 hours. CBC: Recent Labs  Lab 04/20/19 1701 04/21/19 0554 04/22/19 0441 04/23/19 0428  WBC 8.5 6.5 8.0 6.5  NEUTROABS 6.0  --   --  4.4  HGB 9.6* 7.8* 8.4* 7.6*  HCT 29.9* 23.7* 25.9* 23.3*  MCV 92.6 91.5 92.2 93.2  PLT 191 148* 162 136*   Cardiac Enzymes: No results for input(s): CKTOTAL, CKMB, CKMBINDEX, TROPONINI in the last 168 hours. BNP: Invalid input(s): POCBNP CBG: No results for input(s): GLUCAP in the last 168 hours. D-Dimer No results for input(s): DDIMER in the last 72 hours. Hgb A1c No results for input(s): HGBA1C in the last 72 hours. Lipid Profile No results for input(s): CHOL, HDL, LDLCALC, TRIG, CHOLHDL, LDLDIRECT in the last 72 hours. Thyroid function studies No results for input(s): TSH, T4TOTAL, T3FREE, THYROIDAB in the last 72 hours.  Invalid input(s): FREET3 Anemia work up No results for input(s): VITAMINB12, FOLATE, FERRITIN, TIBC, IRON, RETICCTPCT in the last 72 hours. Urinalysis    Component Value Date/Time   COLORURINE STRAW (A) 04/21/2019 0310   APPEARANCEUR CLEAR 04/21/2019 0310   LABSPEC 1.006 04/21/2019 0310   PHURINE 7.0 04/21/2019 0310   GLUCOSEU NEGATIVE 04/21/2019 0310   HGBUR MODERATE (A) 04/21/2019 0310   BILIRUBINUR NEGATIVE 04/21/2019 0310   KETONESUR NEGATIVE 04/21/2019 0310   PROTEINUR NEGATIVE 04/21/2019 0310   NITRITE NEGATIVE 04/21/2019 0310    LEUKOCYTESUR NEGATIVE 04/21/2019 0310   Sepsis Labs Invalid input(s): PROCALCITONIN,  WBC,  LACTICIDVEN Microbiology Recent Results (from the past 240 hour(s))  SARS CORONAVIRUS 2 Nasal Swab Aptima Multi Swab     Status: None   Collection Time: 04/20/19  8:21 PM   Specimen: Aptima Multi Swab; Nasal Swab  Result Value Ref Range Status   SARS Coronavirus 2 NEGATIVE NEGATIVE Final    Comment: (NOTE) SARS-CoV-2 target nucleic acids are NOT DETECTED. The SARS-CoV-2 RNA is generally detectable in upper and lower respiratory specimens during the acute phase of infection. Negative results do not preclude SARS-CoV-2 infection, do not rule out co-infections with other pathogens, and should not be used as the sole basis for treatment or other patient management decisions. Negative results must be combined with clinical observations, patient history, and epidemiological information. The expected result is Negative. Fact Sheet for Patients: SugarRoll.be Fact Sheet for Healthcare Providers: https://www.woods-mathews.com/ This test is not yet approved or cleared by the [place] FDA and  has been authorized for detection and/or diagnosis of SARS-CoV-2 by FDA under an Emergency Use Authorization (EUA). This EUA will remain  in effect (meaning this test can be used) for the duration of the COVID-19 declaration under Section 56 4(b)(1) of the Act, 21 U.S.C. section 360bbb-3(b)(1), unless the authorization is terminated or revoked sooner. Performed at [facility], [place] [address] [place], [place] [address]      Time coordinating discharge: 35 minutes  SIGNED:   Aline August, MD  [facility] 04/23/2019, 9:47 AM

## 2019-04-23 NOTE — Progress Notes (Signed)
DISCHARGE NOTE HOME Jose Baldwin to be discharged home per MD order. Discussed prescriptions and follow up appointments with the patient's son-[name] on the phone. Prescriptions given to patient's son medication list explained in detail. Patient's son verbalized understanding.Patient's  Son seems so defensive nor as if he knows already on what I am discussing to him about the discharged papers.  Skin clean, dry and intact without evidence of skin break down, no evidence of skin tears noted. IV catheter discontinued intact. Site without signs and symptoms of complications. Dressing and pressure applied. Pt denies pain at the site currently. No complaints noted.  Patient free of lines, drains, and wounds.   An After Visit Summary (AVS) was printed and given to the patient. Patient escorted via wheelchair, and discharged home via private auto.  [place], Zenon Mayo, RN

## 2019-04-23 NOTE — Progress Notes (Signed)
Patient doesn't have any street cloths for him upon his discharged,so we put him of disposable gown.Patient's son son doesn't want to come up and get huis father so we brought him down in a wheelchair even though that the patient is hardly moving.R.n was explaining to him the discharged instructuions and medications ,suddenly he become upset becouse of the discontinuation of Seroquel and placing him back on Potassium.I told him how it affect his dad but Jose Baldwin insisting it should not be discontinued.I asked him ,if the M.D spoke to him but ''he just grabbed the discharged papers from my hand and he said ''no worry ill take care of it,dumb people", pushed the wheelchair.We said to him watch out,your father's feet is touching the cement floor.

## 2019-04-23 NOTE — Progress Notes (Signed)
Patient's son called inquiring about his father's denture. We called him that he he could get it either today or tomorrow.He said ''will someone would drive here to [place] to bring it for me?The person who spoke to him said we are not sure.Marland KitchenMarland Kitchen

## 2019-04-24 NOTE — TOC Transition Note (Signed)
Transition of Care [facility]) - CM/SW Discharge Note   Patient Details  Name: Jose Baldwin MRN: [id number] Date of Birth: Apr 03, 1929  [facility]) CM/SW Contact:  Bartholomew Crews, RN Phone Number: [phone] 04/24/2019, 8:35 AM   Clinical Narrative:    Late entry from [place] yesterday 8/13 - received call from son stating patient's dentures are missing. Spoke with bedside RN, dentures were found and are in labeled bag at nurse's station. Advised son who stated "someone is bringing them." CM asked son if this is what he wanted, son stated "yes." CM advised that information to be relayed. Bedside RN aware.   Final next level of care: [place] Barriers to Discharge: No Barriers Identified   Patient Goals and CMS Choice Patient states their goals for this hospitalization and ongoing recovery are:: return home CMS Medicare.gov Compare Post Acute Care list provided to:: Patient Choice offered to / list presented to : Patient, Adult Children  Discharge Placement                       Discharge Plan and Services                DME Arranged: N/A DME Agency: NA       HH Arranged: RN, PT, OT [place] Agency: Other - See comment([facility]) Date [place]: 04/23/19 Time [place]: 9678 Representative spoke with at [place]: [place] ([place]) Interventions     Readmission Risk Interventions No flowsheet data found.

## 2019-04-24 NOTE — Progress Notes (Signed)
Patient's dentures returned to his son [name] at the main entrance. Dorthey Sawyer, RN

## 2019-04-28 ENCOUNTER — Encounter: Payer: Self-pay | Admitting: Family Medicine

## 2019-04-30 ENCOUNTER — Other Ambulatory Visit: Payer: Self-pay

## 2019-04-30 ENCOUNTER — Telehealth (INDEPENDENT_AMBULATORY_CARE_PROVIDER_SITE_OTHER): Payer: Medicare Other | Admitting: Family Medicine

## 2019-04-30 DIAGNOSIS — R8290 Unspecified abnormal findings in urine: Secondary | ICD-10-CM

## 2019-04-30 DIAGNOSIS — R319 Hematuria, unspecified: Secondary | ICD-10-CM

## 2019-04-30 MED ORDER — CIPROFLOXACIN HCL 250 MG PO TABS: 250.0000 mg | ORAL_TABLET | Freq: Every day | ORAL | 0 refills | Status: DC

## 2019-04-30 NOTE — Patient Instructions (Signed)
-  I sent the medication we discussed to the pharmacy: Meds ordered this encounter  Medications  . ciprofloxacin (CIPRO) 250 MG tablet    Sig: Take 1 tablet (250 mg total) by mouth daily with breakfast.    Dispense:  5 tablet    Refill:  0    Please let us know if you have any questions or concerns regarding this prescription.  Virtual follow up with PCP in 3-5 days. Keep lab visit.  I hope you are feeling better soon! Seek care promptly if your symptoms worsen, new concerns arise or you are not improving with treatment.

## 2019-04-30 NOTE — Progress Notes (Addendum)
Virtual Visit via Video Note  I connected with Jose Baldwin  on 04/30/19 at 11:20 AM EDT by a video enabled telemedicine application and verified that I am speaking with the correct person using two identifiers.  Location patient: home Location provider:work or home office Persons participating in the virtual visit: patient, provider, patient's son Daren  I discussed the limitations of evaluation and management by telemedicine and the availability of in person appointments. The patient expressed understanding and agreed to proceed.   HPI:  Acute visit for " UTI": -reports this has happened several times in the past and has required cipro -has incontinence and this is a recurring thing for him -these symptoms started 2-3 days ago and include a little cloudy urine, a little blood the first 2 days, now cloudy -denies fevers, nausea, vomiting, lethargy, flank pain or pain, change in mental status -son requests rx for cipro and prefers to avoid dragging his father to the doctor or UCC for this and prefers to avoid COVID exposure -pt was dehydrated a few weeks ago and did end up in the hospital and had bump in Cr at that time, trending down on discharge. They have a lab visit next week per the son to recheck kidney function. Son reports pt has been doing well otherwise and is drinking fluids well.   ROS: See pertinent positives and negatives per HPI.  Past Medical History:  Diagnosis Date  . Hypertension   . Prostate cancer ([place])   . Syncope     Past Surgical History:  Procedure Laterality Date  . BACK SURGERY    . IR VERTEBROPLASTY CERV/THOR BX INC UNI/BIL INC/INJECT/IMAGING  01/31/2018  . IR VERTEBROPLASTY CERV/THOR BX INC UNI/BIL INC/INJECT/IMAGING  03/24/2019    Family History  Problem Relation Age of Onset  . Heart attack Father 28    SOCIAL HX: see hpi  Current Outpatient Medications:  .  aspirin 81 MG chewable tablet, Chew 1 tablet (81 mg total) by mouth daily., Disp: 30 tablet,  Rfl: 0 .  carbidopa-levodopa (SINEMET IR) 25-100 MG tablet, Take 1 tablet by mouth 3 (three) times daily. (Patient taking differently: Take 1 tablet by mouth See admin instructions. Take 1 tablet by mouth three times a day (8 AM, 12 NOON, and 7 PM)), Disp: 90 tablet, Rfl: 2 .  cloNIDine (CATAPRES) 0.2 MG tablet, Take 0.2 mg by mouth See admin instructions. Take 0.2 mg by mouth three times a day (8 AM, 12 NOON, and 7 PM), Disp: , Rfl:  .  diltiazem (CARDIZEM CD) 300 MG 24 hr capsule, Take 300 mg by mouth at bedtime., Disp: , Rfl:  .  fludrocortisone (FLORINEF) 0.1 MG tablet, Take 2 tablets (0.2 mg total) by mouth at bedtime., Disp: 180 tablet, Rfl: 1 .  ketoconazole (NIZORAL) 2 % cream, Apply 1 application topically daily as needed (facial irritation)., Disp: , Rfl:  .  ondansetron (ZOFRAN) 4 MG tablet, Take 1 tablet (4 mg total) by mouth every 6 (six) hours as needed for nausea., Disp: 20 tablet, Rfl: 0 .  polyethylene glycol (MIRALAX / GLYCOLAX) 17 g packet, Take 17 g by mouth daily as needed for mild constipation., Disp: 14 each, Rfl: 0 .  potassium chloride SA (K-DUR) 20 MEQ tablet, Take 2 tablets (40 mEq total) by mouth daily., Disp: 30 tablet, Rfl: 0 .  ranibizumab (LUCENTIS) 0.5 MG/0.05ML SOLN, 0.5 mg by Intravitreal route every 8 (eight) weeks. , Disp: , Rfl:  .  rosuvastatin (CRESTOR) 40 MG tablet, Take 1  tablet (40 mg total) by mouth daily at 6 PM., Disp: 30 tablet, Rfl: 0 .  thiamine (VITAMIN B-1) 100 MG tablet, Take 100 mg by mouth at bedtime. , Disp: , Rfl:   EXAM:  VITALS per patient if applicable: denies fever  GENERAL: alert, oriented, appears well and in no acute distress  HEENT: atraumatic, conjunttiva clear, no obvious abnormalities on inspection of external nose and ears  NECK: normal movements of the head and neck  LUNGS: on inspection no signs of respiratory distress, breathing rate appears normal, no obvious gross SOB, gasping or wheezing  CV: no obvious cyanosis  MS:  moves all visible extremities without noticeable abnormality  PSYCH/NEURO: pleasant and cooperative, no obvious depression or anxiety  ASSESSMENT AND PLAN:  Discussed the following assessment and plan:  Cloudy urine  Hematuria, unspecified type  -we discussed possible serious and likely etiologies, workup and treatment, treatment risks and return precautions. I advised that we typically do not treat male UTIs/complicated cystitis via telemedicine, the risks and limitations of empiric tx, particularly given recent issues. They are adamant about avoiding an inperson visit for this to avoid COVID19 exposure and feel symptoms are the same as prior symptoms when he was treated with cipro.  -after this discussion, Aylan and his son opted for renally dosed cipro 250 daily for 5 days, with an understanding of risks and an agreement to seek in person care or go to [facility] if any worsening, not improving or any other symptoms or concerns.  -follow up advised in a few days with PCP/lab visit -of course, we advised [place]  to return or notify a doctor immediately if symptoms worsen or persist or new concerns arise.  I discussed the assessment and treatment plan with the patient. The patient was provided an opportunity to ask questions and all were answered. The patient agreed with the plan and demonstrated an understanding of the instructions.   The patient was advised to call back or seek an in-person evaluation if the symptoms worsen or if the condition fails to improve as anticipated.  Let patient know I would route note to PCP to keep in the loop.   Lucretia Kern, DO   Patient Instructions   -I sent the medication we discussed to the pharmacy: Meds ordered this encounter  Medications  . ciprofloxacin (CIPRO) 250 MG tablet    Sig: Take 1 tablet (250 mg total) by mouth daily with breakfast.    Dispense:  5 tablet    Refill:  0    Please let us know if you have any questions or concerns  regarding this prescription.  Virtual follow up with PCP in 3-5 days. Keep lab visit.  I hope you are feeling better soon! Seek care promptly if your symptoms worsen, new concerns arise or you are not improving with treatment.

## 2019-04-30 NOTE — Progress Notes (Signed)
error 

## 2019-05-07 ENCOUNTER — Other Ambulatory Visit: Payer: Self-pay | Admitting: Cardiology

## 2019-05-15 ENCOUNTER — Ambulatory Visit: Payer: Medicare Other | Admitting: Cardiology

## 2019-05-20 ENCOUNTER — Emergency Department (HOSPITAL_COMMUNITY): Payer: Medicare Other

## 2019-05-20 ENCOUNTER — Encounter (HOSPITAL_COMMUNITY): Payer: Self-pay

## 2019-05-20 ENCOUNTER — Other Ambulatory Visit: Payer: Self-pay

## 2019-05-20 ENCOUNTER — Inpatient Hospital Stay (HOSPITAL_COMMUNITY)
Admission: EM | Admit: 2019-05-20 | Discharge: 2019-05-25 | DRG: 309 | Disposition: A | Discharge status: Skilled Nursing Facility | Payer: Medicare Other | Attending: Internal Medicine | Admitting: Internal Medicine

## 2019-05-20 ENCOUNTER — Telehealth: Payer: Self-pay

## 2019-05-20 DIAGNOSIS — R1310 Dysphagia, unspecified: Secondary | ICD-10-CM | POA: Diagnosis present

## 2019-05-20 DIAGNOSIS — I498 Other specified cardiac arrhythmias: Principal | ICD-10-CM | POA: Diagnosis present

## 2019-05-20 DIAGNOSIS — N390 Urinary tract infection, site not specified: Secondary | ICD-10-CM | POA: Diagnosis not present

## 2019-05-20 DIAGNOSIS — Z23 Encounter for immunization: Secondary | ICD-10-CM

## 2019-05-20 DIAGNOSIS — D649 Anemia, unspecified: Secondary | ICD-10-CM

## 2019-05-20 DIAGNOSIS — R319 Hematuria, unspecified: Secondary | ICD-10-CM | POA: Diagnosis not present

## 2019-05-20 DIAGNOSIS — N183 Chronic kidney disease, stage 3 unspecified: Secondary | ICD-10-CM

## 2019-05-20 DIAGNOSIS — Z66 Do not resuscitate: Secondary | ICD-10-CM | POA: Diagnosis present

## 2019-05-20 DIAGNOSIS — R001 Bradycardia, unspecified: Secondary | ICD-10-CM | POA: Diagnosis present

## 2019-05-20 DIAGNOSIS — F0280 Dementia in other diseases classified elsewhere without behavioral disturbance: Secondary | ICD-10-CM | POA: Diagnosis present

## 2019-05-20 DIAGNOSIS — Z8546 Personal history of malignant neoplasm of prostate: Secondary | ICD-10-CM

## 2019-05-20 DIAGNOSIS — I129 Hypertensive chronic kidney disease with stage 1 through stage 4 chronic kidney disease, or unspecified chronic kidney disease: Secondary | ICD-10-CM | POA: Diagnosis present

## 2019-05-20 DIAGNOSIS — D631 Anemia in chronic kidney disease: Secondary | ICD-10-CM | POA: Diagnosis present

## 2019-05-20 DIAGNOSIS — G20 Parkinson's disease: Secondary | ICD-10-CM | POA: Diagnosis present

## 2019-05-20 DIAGNOSIS — R5381 Other malaise: Secondary | ICD-10-CM

## 2019-05-20 DIAGNOSIS — Z923 Personal history of irradiation: Secondary | ICD-10-CM

## 2019-05-20 DIAGNOSIS — Z7189 Other specified counseling: Secondary | ICD-10-CM

## 2019-05-20 DIAGNOSIS — Z681 Body mass index (BMI) 19 or less, adult: Secondary | ICD-10-CM

## 2019-05-20 DIAGNOSIS — L219 Seborrheic dermatitis, unspecified: Secondary | ICD-10-CM

## 2019-05-20 DIAGNOSIS — G20A1 Parkinson's disease without dyskinesia, without mention of fluctuations: Secondary | ICD-10-CM

## 2019-05-20 DIAGNOSIS — R296 Repeated falls: Secondary | ICD-10-CM | POA: Diagnosis present

## 2019-05-20 DIAGNOSIS — I10 Essential (primary) hypertension: Secondary | ICD-10-CM

## 2019-05-20 DIAGNOSIS — Z8249 Family history of ischemic heart disease and other diseases of the circulatory system: Secondary | ICD-10-CM

## 2019-05-20 DIAGNOSIS — Z87891 Personal history of nicotine dependence: Secondary | ICD-10-CM

## 2019-05-20 DIAGNOSIS — Z515 Encounter for palliative care: Secondary | ICD-10-CM

## 2019-05-20 DIAGNOSIS — Z7982 Long term (current) use of aspirin: Secondary | ICD-10-CM

## 2019-05-20 DIAGNOSIS — Z20828 Contact with and (suspected) exposure to other viral communicable diseases: Secondary | ICD-10-CM | POA: Diagnosis present

## 2019-05-20 DIAGNOSIS — R627 Adult failure to thrive: Secondary | ICD-10-CM | POA: Diagnosis present

## 2019-05-20 LAB — COMPREHENSIVE METABOLIC PANEL
ALT: 5 U/L (ref 0–44)
AST: 10 U/L — ABNORMAL LOW (ref 15–41)
Albumin: 2.9 g/dL — ABNORMAL LOW (ref 3.5–5.0)
Alkaline Phosphatase: 56 U/L (ref 38–126)
Anion gap: 7 (ref 5–15)
BUN: 43 mg/dL — ABNORMAL HIGH (ref 8–23)
CO2: 23 mmol/L (ref 22–32)
Calcium: 8.5 mg/dL — ABNORMAL LOW (ref 8.9–10.3)
Chloride: 108 mmol/L (ref 98–111)
Creatinine, Ser: 1.66 mg/dL — ABNORMAL HIGH (ref 0.61–1.24)
GFR calc Af Amer: 42 mL/min — ABNORMAL LOW (ref >60)
GFR calc non Af Amer: 36 mL/min — ABNORMAL LOW (ref >60)
Glucose, Bld: 98 mg/dL (ref 70–99)
Potassium: 4.1 mmol/L (ref 3.5–5.1)
Sodium: 138 mmol/L (ref 135–145)
Total Bilirubin: 0.3 mg/dL (ref 0.3–1.2)
Total Protein: 5.6 g/dL — ABNORMAL LOW (ref 6.5–8.1)

## 2019-05-20 LAB — CBC WITH DIFFERENTIAL/PLATELET
Abs Immature Granulocytes: 0.03 10*3/uL (ref 0.00–0.07)
Basophils Absolute: 0 10*3/uL (ref 0.0–0.1)
Basophils Relative: 0 %
Eosinophils Absolute: 0 10*3/uL (ref 0.0–0.5)
Eosinophils Relative: 0 %
HCT: 24.1 % — ABNORMAL LOW (ref 39.0–52.0)
Hemoglobin: 7.5 g/dL — ABNORMAL LOW (ref 13.0–17.0)
Immature Granulocytes: 0 %
Lymphocytes Relative: 12 %
Lymphs Abs: 0.9 10*3/uL (ref 0.7–4.0)
MCH: 31 pg (ref 26.0–34.0)
MCHC: 31.1 g/dL (ref 30.0–36.0)
MCV: 99.6 fL (ref 80.0–100.0)
Monocytes Absolute: 0.6 10*3/uL (ref 0.1–1.0)
Monocytes Relative: 8 %
Neutro Abs: 6.3 10*3/uL (ref 1.7–7.7)
Neutrophils Relative %: 80 %
Platelets: 170 10*3/uL (ref 150–400)
RBC: 2.42 MIL/uL — ABNORMAL LOW (ref 4.22–5.81)
RDW: 14.7 % (ref 11.5–15.5)
WBC: 7.9 10*3/uL (ref 4.0–10.5)
nRBC: 0 % (ref 0.0–0.2)

## 2019-05-20 LAB — SARS CORONAVIRUS 2 BY RT PCR (HOSPITAL ORDER, PERFORMED IN ~~LOC~~ HOSPITAL LAB): SARS Coronavirus 2: NEGATIVE

## 2019-05-20 LAB — MAGNESIUM: Magnesium: 1.8 mg/dL (ref 1.7–2.4)

## 2019-05-20 LAB — URINALYSIS, ROUTINE W REFLEX MICROSCOPIC
Bilirubin Urine: NEGATIVE
Glucose, UA: NEGATIVE mg/dL
Ketones, ur: NEGATIVE mg/dL
Nitrite: NEGATIVE
Protein, ur: NEGATIVE mg/dL
Specific Gravity, Urine: 1.017 (ref 1.005–1.030)
pH: 5 (ref 5.0–8.0)

## 2019-05-20 LAB — LACTIC ACID, PLASMA: Lactic Acid, Venous: 1 mmol/L (ref 0.5–1.9)

## 2019-05-20 MED ORDER — ACETAMINOPHEN 325 MG PO TABS: 650.0000 mg | ORAL_TABLET | Freq: Four times a day (QID) | ORAL | Status: DC | PRN

## 2019-05-20 MED ORDER — VITAMIN B-1 100 MG PO TABS
100.0000 mg | ORAL_TABLET | Freq: Every day | ORAL | Status: DC
  Administered 2019-05-20 – 2019-05-25 (×6): 100 mg via ORAL
  Filled 2019-05-20 (×6): qty 1

## 2019-05-20 MED ORDER — CLONIDINE HCL 0.2 MG PO TABS: 0.2000 mg | ORAL_TABLET | Freq: Once | ORAL | Status: DC

## 2019-05-20 MED ORDER — CARBIDOPA-LEVODOPA 25-250 MG PO TABS: 1.0000 | ORAL_TABLET | Freq: Once | ORAL | Status: DC

## 2019-05-20 MED ORDER — ROSUVASTATIN CALCIUM 20 MG PO TABS
40.0000 mg | ORAL_TABLET | Freq: Every day | ORAL | Status: DC
  Administered 2019-05-21 – 2019-05-24 (×4): 40 mg via ORAL
  Filled 2019-05-20 (×4): qty 2

## 2019-05-20 MED ORDER — SODIUM CHLORIDE 0.9 % IV SOLN
1.0000 g | INTRAVENOUS | Status: DC
  Administered 2019-05-20 – 2019-05-21 (×2): 1 g via INTRAVENOUS
  Filled 2019-05-20 (×2): qty 10

## 2019-05-20 MED ORDER — POLYETHYLENE GLYCOL 3350 17 G PO PACK: 17.0000 g | PACK | Freq: Every day | ORAL | Status: DC | PRN

## 2019-05-20 MED ORDER — QUETIAPINE FUMARATE 25 MG PO TABS
50.0000 mg | ORAL_TABLET | Freq: Every day | ORAL | Status: DC
  Administered 2019-05-21 – 2019-05-24 (×4): 50 mg via ORAL
  Filled 2019-05-20 (×5): qty 2

## 2019-05-20 MED ORDER — CLONIDINE HCL 0.2 MG PO TABS
0.2000 mg | ORAL_TABLET | ORAL | Status: DC
  Administered 2019-05-21 – 2019-05-25 (×14): 0.2 mg via ORAL
  Filled 2019-05-20 (×14): qty 1

## 2019-05-20 MED ORDER — ACETAMINOPHEN 650 MG RE SUPP: 650.0000 mg | Freq: Four times a day (QID) | RECTAL | Status: DC | PRN

## 2019-05-20 MED ORDER — ASPIRIN 81 MG PO CHEW
81.0000 mg | CHEWABLE_TABLET | Freq: Every day | ORAL | Status: DC
  Administered 2019-05-20: 81 mg via ORAL
  Filled 2019-05-20: qty 1

## 2019-05-20 MED ORDER — HYDRALAZINE HCL 20 MG/ML IJ SOLN
5.0000 mg | INTRAMUSCULAR | Status: DC | PRN
  Administered 2019-05-21 – 2019-05-25 (×5): 5 mg via INTRAVENOUS
  Filled 2019-05-20 (×5): qty 1

## 2019-05-20 MED ORDER — CARBIDOPA-LEVODOPA 25-250 MG PO TABS
1.0000 | ORAL_TABLET | ORAL | Status: DC
  Administered 2019-05-21 – 2019-05-25 (×14): 1 via ORAL
  Filled 2019-05-20 (×15): qty 1

## 2019-05-20 NOTE — Telephone Encounter (Signed)
Agree.  Pt needs to be evaluated immediately.  Would advised to call EMS.

## 2019-05-20 NOTE — Telephone Encounter (Signed)
Copied from [place] [phone]. Topic: General - Other >> May 19, 2019  1:10 PM Leward Quan A wrote: Reason for CRM: [name] visiting Nurse with [facility] called to report patient HR was 34 and she spoke with son Jose Baldwin  who said he was on the way over to take him to the ED at [facility]. Any questions Hassan Rowan can be reached at Ph#  [phone]

## 2019-05-20 NOTE — ED Provider Notes (Signed)
[facility] Provider Note   CSN: [id number] Arrival date & time: 05/20/19  1039     History   Chief Complaint Chief Complaint  Patient presents with  . Hematuria    HPI Jose Baldwin is a 85 y.o. male.  He is brought in by his son for evaluation of 2 problems.  He has had possible urinary tract infection and had a tele-visit with his primary care doctor and finished a week of Cipro.  He has had intermittent hematuria throughout this and despite the antibiotics he is still having intermittent hematuria in his diaper.  The patient himself denies any urinary symptoms.  No known fever.  The other concern is that yesterday the visiting nurse checked his heart rate and found it to be in the 30s.  Reportedly checked at 3 different ways.  His son held the patient's diltiazem at the recommendation of the primary care doctor but did not want to bring him in until today.  His heart rate is been around 100 here.  No chest pain or shortness of breath no headache no abdominal pain.  He says he feels a little lightheaded at times but this is not unusual for him.     The history is provided by the patient and a relative.  Hematuria This is a new problem. The current episode started more than 1 week ago. The problem occurs every several days. The problem has not changed since onset.Pertinent negatives include no chest pain, no abdominal pain, no headaches and no shortness of breath. Nothing aggravates the symptoms. Nothing relieves the symptoms. He has tried nothing for the symptoms. The treatment provided no relief.    Past Medical History:  Diagnosis Date  . Hypertension   . Prostate cancer ([place])   . Syncope     Patient Active Problem List   Diagnosis Date Noted  . Hypokalemia 04/20/2019  . Hypomagnesemia 04/20/2019  . Parkinson's disease ([place]) 04/20/2019  . Cardiac arrhythmia 04/20/2019  . Acute renal failure superimposed on stage 3 chronic kidney disease  ([place]) 04/20/2019  . Protein-calorie malnutrition, severe 03/31/2019  . Cerebral embolism with cerebral infarction 03/30/2019  . POTS (postural orthostatic tachycardia syndrome) 03/28/2019  . HTN (hypertension) 02/04/2018  . Prostate cancer ([place]) 02/04/2018  . Orthostatic hypotension 02/04/2018  . Iron deficiency anemia 02/04/2018  . Syncope 02/02/2018    Past Surgical History:  Procedure Laterality Date  . BACK SURGERY    . IR VERTEBROPLASTY CERV/THOR BX INC UNI/BIL INC/INJECT/IMAGING  01/31/2018  . IR VERTEBROPLASTY CERV/THOR BX INC UNI/BIL INC/INJECT/IMAGING  03/24/2019        Home Medications    Prior to Admission medications   Medication Sig Start Date End Date Taking? Authorizing Provider  aspirin 81 MG chewable tablet Chew 1 tablet (81 mg total) by mouth daily. 04/01/19   Nita Sells, MD  carbidopa-levodopa (SINEMET IR) 25-100 MG tablet Take 1 tablet by mouth 3 (three) times daily. Patient taking differently: Take 1 tablet by mouth See admin instructions. Take 1 tablet by mouth three times a day (8 AM, 12 NOON, and 7 PM) 03/31/19   Nita Sells, MD  ciprofloxacin (CIPRO) 250 MG tablet Take 1 tablet (250 mg total) by mouth daily with breakfast. 04/30/19   Lucretia Kern, DO  cloNIDine (CATAPRES) 0.2 MG tablet Take 0.2 mg by mouth See admin instructions. Take 0.2 mg by mouth three times a day (8 AM, 12 NOON, and 7 PM)    [provider]  diltiazem (CARDIZEM CD) 300 MG 24 hr capsule Take 300 mg by mouth at bedtime.    [provider]  fludrocortisone (FLORINEF) 0.1 MG tablet Take 2 tablets (0.2 mg total) by mouth at bedtime. 12/29/18   Minus Breeding, MD  ketoconazole (NIZORAL) 2 % cream Apply 1 application topically daily as needed (facial irritation). 04/23/19   Aline August, MD  ondansetron (ZOFRAN) 4 MG tablet Take 1 tablet (4 mg total) by mouth every 6 (six) hours as needed for nausea. 04/23/19   Aline August, MD  polyethylene glycol (MIRALAX /  GLYCOLAX) 17 g packet Take 17 g by mouth daily as needed for mild constipation. 04/23/19   Aline August, MD  potassium chloride SA (K-DUR) 20 MEQ tablet Take 2 tablets (40 mEq total) by mouth daily. 04/23/19   Aline August, MD  ranibizumab (LUCENTIS) 0.5 MG/0.05ML SOLN 0.5 mg by Intravitreal route every 8 (eight) weeks.     [provider]  rosuvastatin (CRESTOR) 40 MG tablet Take 1 tablet (40 mg total) by mouth daily at 6 PM. 04/23/19   Aline August, MD  thiamine (VITAMIN B-1) 100 MG tablet Take 100 mg by mouth at bedtime.     [provider]    Family History Family History  Problem Relation Age of Onset  . Heart attack Father 34    Social History Social History   Tobacco Use  . Smoking status: Former Smoker    Packs/day: 2.00    Years: 30.00    Pack years: 60.00    Types: Cigarettes    Quit date: 1990    Years since quitting: 30.7  . Smokeless tobacco: Never Used  Substance Use Topics  . Alcohol use: Yes    Alcohol/week: 14.0 standard drinks    Types: 14 Cans of beer per week  . Drug use: Never     Allergies   Patient has no known allergies.   Review of Systems Review of Systems  Constitutional: Negative for diaphoresis and fever.  HENT: Negative for sore throat.   Eyes: Negative for visual disturbance.  Respiratory: Positive for cough (nonproductive). Negative for shortness of breath.   Cardiovascular: Negative for chest pain.  Gastrointestinal: Negative for abdominal pain, nausea and vomiting.  Genitourinary: Positive for hematuria. Negative for dysuria.  Musculoskeletal: Negative for back pain.  Skin: Negative for rash.  Neurological: Positive for dizziness. Negative for headaches.     Physical Exam Updated Vital Signs BP (!) 159/82   Pulse 90   Temp 98.5 F (36.9 C) (Oral)   Resp 17   Ht 6\' 4"  (1.93 m)   Wt 60 kg   SpO2 98%   BMI 16.10 kg/m   Physical Exam Vitals signs and nursing note reviewed.  Constitutional:       Appearance: Normal appearance. He is well-developed.  HENT:     Head: Normocephalic and atraumatic.  Eyes:     Conjunctiva/sclera: Conjunctivae normal.  Neck:     Musculoskeletal: Neck supple.  Cardiovascular:     Rate and Rhythm: Normal rate and regular rhythm.     Heart sounds: No murmur.  Pulmonary:     Effort: Pulmonary effort is normal. No respiratory distress.     Breath sounds: Normal breath sounds.  Abdominal:     Palpations: Abdomen is soft.     Tenderness: There is no abdominal tenderness.  Musculoskeletal: Normal range of motion.        General: No tenderness.     Right lower  leg: Edema present.     Left lower leg: Edema present.  Skin:    General: Skin is warm and dry.     Capillary Refill: Capillary refill takes less than 2 seconds.  Neurological:     General: No focal deficit present.     Mental Status: He is alert. Mental status is at baseline.     Comments: He is slightly slow to respond and slow with his movements.  His son says is baseline for him with his Parkinson's and he needs his afternoon dose of carbidopa.      ED Treatments / Results  Labs (all labs ordered are listed, but only abnormal results are displayed) Labs Reviewed  COMPREHENSIVE METABOLIC PANEL - Abnormal; Notable for the following components:      Result Value   BUN 43 (*)    Creatinine, Ser 1.66 (*)    Calcium 8.5 (*)    Total Protein 5.6 (*)    Albumin 2.9 (*)    AST 10 (*)    GFR calc non Af Amer 36 (*)    GFR calc Af Amer 42 (*)    All other components within normal limits  CBC WITH DIFFERENTIAL/PLATELET - Abnormal; Notable for the following components:   RBC 2.42 (*)    Hemoglobin 7.5 (*)    HCT 24.1 (*)    All other components within normal limits  CULTURE, BLOOD (ROUTINE X 2)  CULTURE, BLOOD (ROUTINE X 2)  URINE CULTURE  LACTIC ACID, PLASMA  LACTIC ACID, PLASMA  URINALYSIS, ROUTINE W REFLEX MICROSCOPIC  PROTIME-INR    EKG EKG  Interpretation  Date/Time:  Wednesday May 20 2019 17:44:22 EDT Ventricular Rate:  100 PR Interval:    QRS Duration: 142 QT Interval:  368 QTC Calculation: 475 R Axis:   -67 Text Interpretation:  Sinus tachycardia Atrial premature complexes Left bundle branch block Since last tracing rate faster 8/20 Confirmed by Aletta Edouard [phone]) on 05/20/2019 5:49:37 PM   Radiology Dg Chest 2 View  Result Date: 05/20/2019 CLINICAL DATA:  Fever. EXAM: CHEST - 2 VIEW COMPARISON:  Radiographs of Feb 05, 2018. FINDINGS: The heart size and mediastinal contours are within normal limits. Atherosclerosis of thoracic aorta is noted. No pneumothorax is noted. Small pleural effusions and bibasilar atelectasis may be present. Old right rib fractures are noted. Status post lower thoracic kyphoplasty. IMPRESSION: Probable mild bibasilar subsegmental atelectasis is noted with small pleural effusions. Aortic Atherosclerosis (ICD10-I70.0). Electronically Signed   By: Marijo Conception M.D.   On: 05/20/2019 12:18    Procedures Procedures (including critical care time)  Medications Ordered in ED Medications  aspirin chewable tablet 81 mg (81 mg Oral Given 05/20/19 2223)  cloNIDine (CATAPRES) tablet 0.2 mg (has no administration in time range)  rosuvastatin (CRESTOR) tablet 40 mg (has no administration in time range)  QUEtiapine (SEROQUEL) tablet 50 mg (has no administration in time range)  polyethylene glycol (MIRALAX / GLYCOLAX) packet 17 g (has no administration in time range)  carbidopa-levodopa (SINEMET IR) 25-250 MG per tablet immediate release 1 tablet (has no administration in time range)  thiamine (VITAMIN B-1) tablet 100 mg (100 mg Oral Given 05/20/19 2223)  acetaminophen (TYLENOL) tablet 650 mg (has no administration in time range)    Or  acetaminophen (TYLENOL) suppository 650 mg (has no administration in time range)  cefTRIAXone (ROCEPHIN) 1 g in sodium chloride 0.9 % 100 mL IVPB (1 g Intravenous New  Bag/Given 05/20/19 2223)  hydrALAZINE (APRESOLINE) injection 5 mg (  has no administration in time range)     Initial Impression / Assessment and Plan / ED Course  I have reviewed the triage vital signs and the nursing notes.  Pertinent labs & imaging results that were available during my care of the patient were reviewed by me and considered in my medical decision making (see chart for details).  Clinical Course as of May 20 2231  Wed May 19, 3557  813 85 year old male with history of Parkinson's here with hematuria intermittent for a week plus in the setting of a possible UTI.   [MB]  [id number] Reviewed the patient's labs with him and his son.  We have done an in and out cath for him.  He is had trending down hemoglobins.  His son tells me now that he is hoping the patient can be admitted so we can get his 3 midnights and go to [place].   [MB]  52 Son said the patient's had these bradycardic episodes in the past.  The patient lives alone but the son has had to be there to do a lot of care.  They have a home health aide with [facility] and the PCP has made recommendations multiple times to have the patient in a facility.  The son has been very hesitant to do this but he is a now and hoping the patient be admitted.  It sounds like he is already made some inroads on getting the patient into a facility.    [MB]  2010 Discussed with Dr. Marlowe Sax from [facility] to evaluate the patient for admission.   [MB]    Clinical Course User Index [MB] Hayden Rasmussen, MD        Final Clinical Impressions(s) / ED Diagnoses   Final diagnoses:  Anemia, unspecified type  Bradycardia  Parkinson disease ([place])  Hematuria of unknown cause    ED Discharge Orders    None       Hayden Rasmussen, MD 05/20/19 2233

## 2019-05-20 NOTE — ED Triage Notes (Signed)
Pt arrives POV for eval of recurrent UTI. Family member reports that he was treated for a UTI and completed treatment 1 week ago, but hematuria is persistent. Son also reports a heart rate of 31 yesterday, today normotensive, HR 99 in triage. Low grade temp to 99.6

## 2019-05-20 NOTE — H&P (Signed)
History and Physical    Jose Baldwin [id number] DOB: 1929/09/05 DOA: 05/20/2019  PCP: Billie Ruddy, MD Patient coming from: Home  Chief Complaint: Bradycardia, hematuria  HPI: Jose Baldwin is a 85 y.o. male with medical history significant of hypertension, prostate cancer presenting to the hospital for evaluation of bradycardia and hematuria. Patient states a month ago his blood pressure dropped and he passed out.  No complaints at present.  Denies headaches, chest pain, shortness of breath, cough, nausea, vomiting, abdominal pain, diarrhea, dysuria, or urinary frequency/urgency.  No additional history could be obtained from him.  I spoke to the patient's son over the phone who tells me that patient had blood in his urine several weeks ago and was diagnosed with a UTI at that time and treated with Cipro.  Despite treatment he continued to have intermittent hematuria and foul-smelling urine.  For the last 2 days his urine has been red in color.  Son states patient does not have a history of kidney stones.  Yesterday afternoon patient's home health nurse noticed that his pulse was 32 and she had checked it 3 different ways.  The nurse then called the patient's PCP who advised to bring him into the hospital but patient refused.  Son held the patient's evening dose of diltiazem and check his vitals in the evening.  His blood pressure was not high at that time and his pulse was 101.  States today patient missed his afternoon dose of clonidine but family was able to give him his evening dose around 6 PM.  ED Course: Afebrile, borderline tachycardic on arrival.  Not tachypneic.  Not hypoxic.  Blood pressure elevated with systolic up to [id number].  No leukocytosis.  Hemoglobin 7.5, no significant change since labs done 3 weeks ago.  Lactic acid normal.  Creatinine 1.6, at baseline.  UA with trace amount of leukocytes, 0-5 RBCs, 6-10 WBCs, and rare bacteria on microscopic examination.  Urine culture pending.  Blood culture x2 pending.  Chest x-ray showing mild bibasilar subsegmental atelectasis with small pleural effusions.  Patient received Sinemet and clonidine in the ED.  Review of Systems:  All systems reviewed and apart from history of presenting illness, are negative.  Past Medical History:  Diagnosis Date  . Hypertension   . Prostate cancer ([place])   . Syncope     Past Surgical History:  Procedure Laterality Date  . BACK SURGERY    . IR VERTEBROPLASTY CERV/THOR BX INC UNI/BIL INC/INJECT/IMAGING  01/31/2018  . IR VERTEBROPLASTY CERV/THOR BX INC UNI/BIL INC/INJECT/IMAGING  03/24/2019     reports that he quit smoking about 30 years ago. His smoking use included cigarettes. He has a 60.00 pack-year smoking history. He has never used smokeless tobacco. He reports current alcohol use of about 14.0 standard drinks of alcohol per week. He reports that he does not use drugs.  No Known Allergies  Family History  Problem Relation Age of Onset  . Heart attack Father 63    Prior to Admission medications   Medication Sig Start Date End Date Taking? Authorizing Provider  aspirin 81 MG chewable tablet Chew 1 tablet (81 mg total) by mouth daily. 04/01/19  Yes Nita Sells, MD  carbidopa-levodopa (SINEMET IR) 25-250 MG tablet Take 1 tablet by mouth 3 (three) times daily. 9am, 1pm, 6pm   Yes [provider]  cloNIDine (CATAPRES) 0.2 MG tablet Take 0.2 mg by mouth See admin instructions. 9am, 1pm, 6pm   Yes [provider]  diltiazem (  CARDIZEM CD) 300 MG 24 hr capsule Take 300 mg by mouth daily at 6 PM.    Yes [provider]  ketoconazole (NIZORAL) 2 % cream Apply 1 application topically daily as needed (facial irritation). Patient taking differently: Apply 1 application topically daily as needed (facial irritation). 3 times a week 04/23/19  Yes Aline August, MD  ondansetron (ZOFRAN) 4 MG tablet Take 1 tablet (4 mg total) by mouth every 6 (six) hours as needed for  nausea. 04/23/19  Yes Aline August, MD  polyethylene glycol (MIRALAX / GLYCOLAX) 17 g packet Take 17 g by mouth daily as needed for mild constipation. 04/23/19  Yes Aline August, MD  potassium chloride SA (K-DUR) 20 MEQ tablet Take 2 tablets (40 mEq total) by mouth daily. Patient taking differently: Take 20 mEq by mouth daily.  04/23/19  Yes Aline August, MD  QUEtiapine (SEROQUEL) 50 MG tablet Take 50 mg by mouth daily at 6 PM.   Yes [provider]  ranibizumab (LUCENTIS) 0.5 MG/0.05ML SOLN 0.5 mg by Intravitreal route every 8 (eight) weeks.    Yes [provider]  rosuvastatin (CRESTOR) 40 MG tablet Take 1 tablet (40 mg total) by mouth daily at 6 PM. 04/23/19  Yes Aline August, MD  thiamine (VITAMIN B-1) 100 MG tablet Take 100 mg by mouth daily. 1pm   Yes [provider]  carbidopa-levodopa (SINEMET IR) 25-100 MG tablet Take 1 tablet by mouth 3 (three) times daily. Patient not taking: Reported on 05/20/2019 03/31/19   Nita Sells, MD  ciprofloxacin (CIPRO) 250 MG tablet Take 1 tablet (250 mg total) by mouth daily with breakfast. Patient not taking: Reported on 05/20/2019 04/30/19   Lucretia Kern, DO  fludrocortisone (FLORINEF) 0.1 MG tablet Take 2 tablets (0.2 mg total) by mouth at bedtime. Patient not taking: Reported on 05/20/2019 12/29/18   Minus Breeding, MD    Physical Exam: Vitals:   05/20/19 2315 05/20/19 2330 05/20/19 2345 05/21/19 0050  BP: (!) 177/99 (!) 188/95 (!) 166/65 (!) 187/86  Pulse:    96  Resp:      Temp:      TempSrc:      SpO2:    97%  Weight:      Height:        Physical Exam  Constitutional: He appears well-developed and well-nourished. No distress.  HENT:  Head: Normocephalic.  Eyes: Right eye exhibits no discharge. Left eye exhibits no discharge.  Neck: Neck supple.  Cardiovascular: Normal rate, regular rhythm and intact distal pulses.  Pulmonary/Chest: Effort normal and breath sounds normal. No respiratory distress. He  has no wheezes. He has no rales.  Abdominal: Soft. Bowel sounds are normal. He exhibits no distension. There is no abdominal tenderness. There is no guarding.  Musculoskeletal:        General: No edema.  Neurological:  Awake and alert Oriented to person and place  Skin: Skin is warm and dry. He is not diaphoretic.     Labs on Admission: I have personally reviewed following labs and imaging studies  CBC: Recent Labs  Lab 05/20/19 1140  WBC 7.9  NEUTROABS 6.3  HGB 7.5*  HCT 24.1*  MCV 99.6  PLT [id number]   Basic Metabolic Panel: Recent Labs  Lab 05/20/19 1140 05/20/19 1900  NA 138  --   K 4.1  --   CL 108  --   CO2 23  --   GLUCOSE 98  --   BUN 43*  --  CREATININE 1.66*  --   CALCIUM 8.5*  --   MG  --  1.8   GFR: Estimated Creatinine Clearance: 26.1 mL/min (A) (by C-G formula based on SCr of 1.66 mg/dL (H)). Liver Function Tests: Recent Labs  Lab 05/20/19 1140  AST 10*  ALT 5  ALKPHOS 56  BILITOT 0.3  PROT 5.6*  ALBUMIN 2.9*   No results for input(s): LIPASE, AMYLASE in the last 168 hours. No results for input(s): AMMONIA in the last 168 hours. Coagulation Profile: No results for input(s): INR, PROTIME in the last 168 hours. Cardiac Enzymes: No results for input(s): CKTOTAL, CKMB, CKMBINDEX, TROPONINI in the last 168 hours. BNP (last 3 results) No results for input(s): PROBNP in the last 8760 hours. HbA1C: No results for input(s): HGBA1C in the last 72 hours. CBG: No results for input(s): GLUCAP in the last 168 hours. Lipid Profile: No results for input(s): CHOL, HDL, LDLCALC, TRIG, CHOLHDL, LDLDIRECT in the last 72 hours. Thyroid Function Tests: No results for input(s): TSH, T4TOTAL, FREET4, T3FREE, THYROIDAB in the last 72 hours. Anemia Panel: No results for input(s): VITAMINB12, FOLATE, FERRITIN, TIBC, IRON, RETICCTPCT in the last 72 hours. Urine analysis:    Component Value Date/Time   COLORURINE YELLOW 05/20/2019 1750   APPEARANCEUR CLEAR  05/20/2019 1750   LABSPEC 1.017 05/20/2019 1750   PHURINE 5.0 05/20/2019 1750   GLUCOSEU NEGATIVE 05/20/2019 1750   HGBUR SMALL (A) 05/20/2019 1750   BILIRUBINUR NEGATIVE 05/20/2019 1750   KETONESUR NEGATIVE 05/20/2019 1750   PROTEINUR NEGATIVE 05/20/2019 1750   NITRITE NEGATIVE 05/20/2019 1750   LEUKOCYTESUR TRACE (A) 05/20/2019 1750    Radiological Exams on Admission: Dg Chest 2 View  Result Date: 05/20/2019 CLINICAL DATA:  Fever. EXAM: CHEST - 2 VIEW COMPARISON:  Radiographs of Feb 05, 2018. FINDINGS: The heart size and mediastinal contours are within normal limits. Atherosclerosis of thoracic aorta is noted. No pneumothorax is noted. Small pleural effusions and bibasilar atelectasis may be present. Old right rib fractures are noted. Status post lower thoracic kyphoplasty. IMPRESSION: Probable mild bibasilar subsegmental atelectasis is noted with small pleural effusions. Aortic Atherosclerosis (ICD10-I70.0). Electronically Signed   By: Marijo Conception M.D.   On: 05/20/2019 12:18    EKG: Independently reviewed.  Sinus tachycardia, LBBB.  Rate increased since prior tracing.  Assessment/Plan Principal Problem:   UTI (urinary tract infection) Active Problems:   Uncontrolled hypertension   Normocytic anemia   CKD (chronic kidney disease), stage III (HCC)   Physical deconditioning   UTI Afebrile and no leukocytosis.  Lactic acid normal.  No signs of sepsis. UA with trace amount of leukocytes, 6-10 WBCs, and rare bacteria on microscopic examination.  No hematuria at this time.  UA with 0-5 RBCs.  Hemoglobin stable since labs done 3 weeks ago. -Ceftriaxone -Urine culture pending -If patient has recurrence of hematuria in the hospital consider imaging to rule out nephrolithiasis.  Uncontrolled hypertension Systolic in the [id number] to A999333.  Diastolic as high as 0000000.  Per son, patient missed his afternoon dose of clonidine but did receive his evening dose. -Continue home clonidine -IV  hydralazine PRN, IV Lopressor PRN  Concern for bradycardia Per son, home health noticed that the patient's pulse was in the low 30s yesterday.  Pulse currently in the 90-100 range. EKG with sinus rhythm. -Cardiac monitoring -Hold home diltiazem at this time.  IV metoprolol prn HR >110.  -Check TSH, free T4 levels  Normocytic anemia Hemoglobin 7.5, no significant change since labs done  3 weeks ago.  Hemoglobin was as high as 13.2 in July 2020.  Likely related to intermittent hematuria at home. -Type and screen -Continue to monitor CBC -Transfuse PRBCs if hemoglobin less than 7 -Check iron, ferritin, TIBC  Physical deconditioning -PT evaluation  CKD stage III -Stable.  Creatinine 1.6, baseline.  DVT prophylaxis: SCDs at this time given concern for recent hematuria Code Status: DNR.  Confirmed with the patient's son over the phone. Family Communication: Son updated over the phone. Disposition Plan: Anticipate discharge after clinical improvement. Consults called: None Admission status: It is my clinical opinion that referral for OBSERVATION is reasonable and necessary in this patient based on the above information provided. The aforementioned taken together are felt to place the patient at high risk for further clinical deterioration. However it is anticipated that the patient may be medically stable for discharge from the hospital within 24 to 48 hours.  The medical decision making on this patient was of high complexity and the patient is at high risk for clinical deterioration, therefore this is a level 3 visit.  Shela Leff MD [facility] Pager [phone]  If 7PM-7AM, please contact night-coverage [email or web address] Password [email or web address]  05/21/2019, 12:55 AM

## 2019-05-21 ENCOUNTER — Encounter (HOSPITAL_COMMUNITY): Payer: Self-pay | Admitting: General Practice

## 2019-05-21 ENCOUNTER — Telehealth: Payer: Self-pay | Admitting: Family Medicine

## 2019-05-21 DIAGNOSIS — Z681 Body mass index (BMI) 19 or less, adult: Secondary | ICD-10-CM | POA: Diagnosis not present

## 2019-05-21 DIAGNOSIS — R1310 Dysphagia, unspecified: Secondary | ICD-10-CM | POA: Diagnosis present

## 2019-05-21 DIAGNOSIS — Z7982 Long term (current) use of aspirin: Secondary | ICD-10-CM | POA: Diagnosis not present

## 2019-05-21 DIAGNOSIS — I10 Essential (primary) hypertension: Secondary | ICD-10-CM | POA: Diagnosis not present

## 2019-05-21 DIAGNOSIS — R319 Hematuria, unspecified: Secondary | ICD-10-CM | POA: Diagnosis present

## 2019-05-21 DIAGNOSIS — R001 Bradycardia, unspecified: Secondary | ICD-10-CM

## 2019-05-21 DIAGNOSIS — Z8546 Personal history of malignant neoplasm of prostate: Secondary | ICD-10-CM | POA: Diagnosis not present

## 2019-05-21 DIAGNOSIS — R5381 Other malaise: Secondary | ICD-10-CM

## 2019-05-21 DIAGNOSIS — N183 Chronic kidney disease, stage 3 unspecified: Secondary | ICD-10-CM

## 2019-05-21 DIAGNOSIS — I498 Other specified cardiac arrhythmias: Secondary | ICD-10-CM | POA: Diagnosis present

## 2019-05-21 DIAGNOSIS — G20 Parkinson's disease: Secondary | ICD-10-CM | POA: Diagnosis present

## 2019-05-21 DIAGNOSIS — Z923 Personal history of irradiation: Secondary | ICD-10-CM | POA: Diagnosis not present

## 2019-05-21 DIAGNOSIS — D649 Anemia, unspecified: Secondary | ICD-10-CM | POA: Diagnosis not present

## 2019-05-21 DIAGNOSIS — Z8249 Family history of ischemic heart disease and other diseases of the circulatory system: Secondary | ICD-10-CM | POA: Diagnosis not present

## 2019-05-21 DIAGNOSIS — Z23 Encounter for immunization: Secondary | ICD-10-CM | POA: Diagnosis present

## 2019-05-21 DIAGNOSIS — Z7189 Other specified counseling: Secondary | ICD-10-CM | POA: Diagnosis not present

## 2019-05-21 DIAGNOSIS — Z20828 Contact with and (suspected) exposure to other viral communicable diseases: Secondary | ICD-10-CM | POA: Diagnosis present

## 2019-05-21 DIAGNOSIS — D631 Anemia in chronic kidney disease: Secondary | ICD-10-CM | POA: Diagnosis present

## 2019-05-21 DIAGNOSIS — R296 Repeated falls: Secondary | ICD-10-CM | POA: Diagnosis present

## 2019-05-21 DIAGNOSIS — I129 Hypertensive chronic kidney disease with stage 1 through stage 4 chronic kidney disease, or unspecified chronic kidney disease: Secondary | ICD-10-CM | POA: Diagnosis present

## 2019-05-21 DIAGNOSIS — F0280 Dementia in other diseases classified elsewhere without behavioral disturbance: Secondary | ICD-10-CM | POA: Diagnosis present

## 2019-05-21 DIAGNOSIS — N390 Urinary tract infection, site not specified: Secondary | ICD-10-CM | POA: Diagnosis present

## 2019-05-21 DIAGNOSIS — Z87891 Personal history of nicotine dependence: Secondary | ICD-10-CM | POA: Diagnosis not present

## 2019-05-21 DIAGNOSIS — Z515 Encounter for palliative care: Secondary | ICD-10-CM | POA: Diagnosis not present

## 2019-05-21 DIAGNOSIS — Z66 Do not resuscitate: Secondary | ICD-10-CM | POA: Diagnosis present

## 2019-05-21 DIAGNOSIS — R627 Adult failure to thrive: Secondary | ICD-10-CM | POA: Diagnosis present

## 2019-05-21 LAB — BLOOD CULTURE ID PANEL (REFLEXED)

## 2019-05-21 LAB — CBC
HCT: 26.4 % — ABNORMAL LOW (ref 39.0–52.0)
Hemoglobin: 8.5 g/dL — ABNORMAL LOW (ref 13.0–17.0)
MCH: 30.6 pg (ref 26.0–34.0)
MCHC: 32.2 g/dL (ref 30.0–36.0)
MCV: 95 fL (ref 80.0–100.0)
Platelets: 188 10*3/uL (ref 150–400)
RBC: 2.78 MIL/uL — ABNORMAL LOW (ref 4.22–5.81)
RDW: 14.6 % (ref 11.5–15.5)
WBC: 5.9 10*3/uL (ref 4.0–10.5)
nRBC: 0 % (ref 0.0–0.2)

## 2019-05-21 LAB — IRON AND TIBC
Iron: 42 ug/dL — ABNORMAL LOW (ref 45–182)
Saturation Ratios: 15 % — ABNORMAL LOW (ref 17.9–39.5)
TIBC: 281 ug/dL (ref 250–450)
UIBC: 239 ug/dL

## 2019-05-21 LAB — FERRITIN: Ferritin: 119 ng/mL (ref 24–336)

## 2019-05-21 LAB — TSH: TSH: 1.798 u[IU]/mL (ref 0.350–4.500)

## 2019-05-21 LAB — ABO/RH: ABO/RH(D): O POS

## 2019-05-21 LAB — T4, FREE: Free T4: 1.09 ng/dL (ref 0.61–1.12)

## 2019-05-21 MED ORDER — SODIUM CHLORIDE 0.9 % IV SOLN
INTRAVENOUS | Status: DC | PRN
  Administered 2019-05-21: 250 mL via INTRAVENOUS

## 2019-05-21 MED ORDER — HYDRALAZINE HCL 20 MG/ML IJ SOLN
10.0000 mg | Freq: Once | INTRAMUSCULAR | Status: AC
  Administered 2019-05-21: 10 mg via INTRAVENOUS
  Filled 2019-05-21: qty 1

## 2019-05-21 MED ORDER — METOPROLOL TARTRATE 5 MG/5ML IV SOLN: 5.0000 mg | INTRAVENOUS | Status: DC | PRN

## 2019-05-21 MED ORDER — ENSURE ENLIVE PO LIQD
237.0000 mL | Freq: Two times a day (BID) | ORAL | Status: DC
  Administered 2019-05-22 – 2019-05-25 (×7): 237 mL via ORAL

## 2019-05-21 MED ORDER — ADULT MULTIVITAMIN W/MINERALS CH
1.0000 | ORAL_TABLET | Freq: Every day | ORAL | Status: DC
  Administered 2019-05-21 – 2019-05-25 (×5): 1 via ORAL
  Filled 2019-05-21 (×5): qty 1

## 2019-05-21 NOTE — Progress Notes (Signed)
NP on call schorr paged d/t pt's bp being 182/98 even after receiving 5 mg of hydralazine iv in ed pta to unit. Pt oriented to unit. Call bell placed within reach pain, potty, position & etc assessed. Will continue to monitor pt & await any new orders. Hoover Brunette, RN

## 2019-05-21 NOTE — Evaluation (Signed)
Physical Therapy Evaluation Patient Details Name: Jose Baldwin MRN: [id number] DOB: 1929-04-13 Today's Date: 05/21/2019   History of Present Illness  Jose Baldwin is an 85 y.o. male with medical history significant for hypertension, prostate cancer presenting to the hospital for evaluation of bradycardia and hematuria.  Work up and treatment for uti.  Clinical Impression  Pt admitted with/for weakness bradycardia and uti.  Pt is still too weak to be living alone at this time, needing minimal assist for basic mobility..  Pt currently limited functionally due to the problems listed. ( See problems list.)   Pt will benefit from PT to maximize function and safety in order to get ready for next venue listed below.     Follow Up Recommendations SNF;Supervision/Assistance - 24 hour,  Unless family can significantly increase supervision.    Equipment Recommendations  None recommended by PT    Recommendations for Other Services       Precautions / Restrictions Precautions Precautions: Fall      Mobility  Bed Mobility Overal bed mobility: Needs Assistance Bed Mobility: Supine to Sit;Sit to Supine     Supine to sit: Min assist Sit to supine: Min assist   General bed mobility comments: weak scoot to EOB, pt having trouble building momentum.  minor boost and forward assist to stand  Transfers Overall transfer level: Needs assistance   Transfers: Sit to/from Stand Sit to Stand: Min assist         General transfer comment: assist forward and boosted.  descent not fully controlled with assist  Ambulation/Gait Ambulation/Gait assistance: Min guard Gait Distance (Feet): 400 Feet Assistive device: Rolling walker (2 wheeled) Gait Pattern/deviations: Step-through pattern Gait velocity: slower Gait velocity interpretation: 1.31 - 2.62 ft/sec, indicative of limited community ambulator General Gait Details: generally steady, slow and tentative during turns.  Stairs             Wheelchair Mobility    Modified Rankin (Stroke Patients Only)       Balance Overall balance assessment: Needs assistance   Sitting balance-Leahy Scale: Fair         Standing balance comment: fair statically                             Pertinent Vitals/Pain      Home Living Family/patient expects to be discharged to:: Private residence Living Arrangements: Alone Available Help at Discharge: Family Type of Home: Mobile home Home Access: Ramped entrance     Home Layout: One level Home Equipment: Shower seat      Prior Function Level of Independence: Needs assistance   Gait / Transfers Assistance Needed: RW for gait in/outdoor.           Hand Dominance        Extremity/Trunk Assessment   Upper Extremity Assessment Upper Extremity Assessment: Generalized weakness    Lower Extremity Assessment Lower Extremity Assessment: Overall WFL for tasks assessed(with general weakness proximally, R weaker than L)       Communication   Communication: No difficulties  Cognition Arousal/Alertness: Awake/alert Behavior During Therapy: WFL for tasks assessed/performed;Flat affect Overall Cognitive Status: Within Functional Limits for tasks assessed(NT formally)                                        General Comments      Exercises  Assessment/Plan    PT Assessment Patient needs continued PT services  PT Problem List Decreased strength;Decreased activity tolerance;Decreased balance       PT Treatment Interventions Gait training;Functional mobility training;Therapeutic activities;Balance training;Patient/family education    PT Goals (Current goals can be found in the Care Plan section)  Acute Rehab PT Goals Patient Stated Goal: no goals identified PT Goal Formulation: Patient unable to participate in goal setting Time For Goal Achievement: 06/04/19 Potential to Achieve Goals: Good    Frequency Min 3X/week   Barriers  to discharge        Co-evaluation               AM-PAC PT "6 Clicks" Mobility  Outcome Measure Help needed turning from your back to your side while in a flat bed without using bedrails?: A Little Help needed moving from lying on your back to sitting on the side of a flat bed without using bedrails?: A Little Help needed moving to and from a bed to a chair (including a wheelchair)?: A Little Help needed standing up from a chair using your arms (e.g., wheelchair or bedside chair)?: A Little Help needed to walk in hospital room?: A Little Help needed climbing 3-5 steps with a railing? : A Lot 6 Click Score: 17    End of Session   Activity Tolerance: Patient tolerated treatment well Patient left: in bed;with call bell/phone within reach;with bed alarm set Nurse Communication: Mobility status PT Visit Diagnosis: Unsteadiness on feet (R26.81);Muscle weakness (generalized) (M62.81)    Time: [id number] PT Time Calculation (min) (ACUTE ONLY): 29 min   Charges:   PT Evaluation $PT Eval Moderate Complexity: 1 Mod PT Treatments $Gait Training: 8-22 mins        05/21/2019  Donnella Sham, PT Acute Rehabilitation Services [phone]  (pager) [phone]  (office)  Tessie Fass Wyndi Northrup 05/21/2019, 3:45 PM

## 2019-05-21 NOTE — Plan of Care (Signed)

## 2019-05-21 NOTE — Progress Notes (Signed)
Progress Note    Jose Baldwin  [id number] DOB: 06-17-1929  DOA: 05/20/2019 PCP: Billie Ruddy, MD    Brief Narrative:     Medical records reviewed and are as summarized below:  Jose Baldwin is an 85 y.o. male with medical history significant of hypertension, prostate cancer presenting to the hospital for evaluation of bradycardia and hematuria. Patient states a month ago his blood pressure dropped and he passed out.  No complaints at present.  Denies headaches, chest pain, shortness of breath, cough, nausea, vomiting, abdominal pain, diarrhea, dysuria, or urinary frequency/urgency.  No additional history could be obtained from him.  I spoke to the patient's son over the phone who tells me that patient had blood in his urine several weeks ago and was diagnosed with a UTI at that time and treated with Cipro.  Despite treatment he continued to have intermittent hematuria and foul-smelling urine.  For the last 2 days his urine has been red in color.  Son states patient does not have a history of kidney stones.  Yesterday afternoon patient's home health nurse noticed that his pulse was 32 and she had checked it 3 different ways.  The nurse then called the patient's PCP who advised to bring him into the hospital but patient refused.  Son held the patient's evening dose of diltiazem and check his vitals in the evening.  His blood pressure was not high at that time and his pulse was 101.  States today patient missed his afternoon dose of clonidine but family was able to give him his evening dose around 6 PM.  Assessment/Plan:   Principal Problem:   UTI (urinary tract infection) Active Problems:   Uncontrolled hypertension   Normocytic anemia   CKD (chronic kidney disease), stage III (HCC)   Physical deconditioning  Hematuria -not convinced of UTI -last culture was multiple species -Afebrile and no leukocytosis.  Lactic acid normal.  No signs of sepsis. -Ceftriaxone for now-- low  threshold to d/c -Urine culture pending -doubt nephrolithiasis as 1 month ago patient has renal U/S w/o any stones - never has seen urology or had cystoscopy per son-- will get urine cytology--- discussed invasive nature of cystoscopy and goals of care-- will get palliative care consult  Uncontrolled hypertension Systolic in the [id number] to [id number].  Diastolic as high as 0000000.  Per son, patient missed his afternoon dose of clonidine but did receive his evening dose. -Continue home clonidine -pending HR And BP over next 24 hours can consider adding a lower dose of cardizem -IV hydralazine PRN, IV Lopressor PRN  Concern for bradycardia Per son, home health noticed that the patient's pulse was in the low 30s yesterday.  Pulse currently in the 90-100 range -Cardiac monitoring for another 24 hours -Hold home diltiazem at this time.  IV metoprolol prn HR >110.  -TSH, free T4 levels normal  Normocytic anemia -hgb 8.5 today  Physical deconditioning -PT evaluation -? SNF-- family prefers [facility]  CKD stage III -Stable.  Creatinine 1.6 is baseline  Dementia  -seems to be at baseline  Family Communication/Anticipated D/C date and plan/Code Status   DVT prophylaxis: scd Code Status: dnr Family Communication: spoke with sone Disposition Plan: needs further monitoring on tele in hospital as well as palliative care consult for [place]-- 3rd hospitalization in last 2 months, PT Eval   Medical Consultants:   Palliative care   Subjective:   "My BP went low"  Objective:    Vitals:  05/21/19 0447 05/21/19 0652 05/21/19 0802 05/21/19 1134  BP: (!) 153/84  (!) 147/62 (!) 171/68  Pulse: 77  89 90  Resp: 19  20   Temp: 98.4 F (36.9 C)  98.9 F (37.2 C) 98.6 F (37 C)  TempSrc: Oral  Oral Oral  SpO2: 90%  92% 99%  Weight:  57.8 kg    Height:        Intake/Output Summary (Last 24 hours) at 05/21/2019 1208 Last data filed at 05/21/2019 0854 Gross per 24 hour  Intake 700 ml   Output -  Net 700 ml   Filed Weights   05/20/19 1138 05/21/19 0200 05/21/19 [id number]  Weight: 60 kg 57.9 kg 57.8 kg    Exam: In bed, frail appearing Alert to person and cooperative rrr No increased work of breathing Skin is warm and dry  Data Reviewed:   I have personally reviewed following labs and imaging studies:  Labs: Labs show the following:   Basic Metabolic Panel: Recent Labs  Lab 05/20/19 1140 05/20/19 1900  NA 138  --   K 4.1  --   CL 108  --   CO2 23  --   GLUCOSE 98  --   BUN 43*  --   CREATININE 1.66*  --   CALCIUM 8.5*  --   MG  --  1.8   GFR Estimated Creatinine Clearance: 25.1 mL/min (A) (by C-G formula based on SCr of 1.66 mg/dL (H)). Liver Function Tests: Recent Labs  Lab 05/20/19 1140  AST 10*  ALT 5  ALKPHOS 56  BILITOT 0.3  PROT 5.6*  ALBUMIN 2.9*   No results for input(s): LIPASE, AMYLASE in the last 168 hours. No results for input(s): AMMONIA in the last 168 hours. Coagulation profile No results for input(s): INR, PROTIME in the last 168 hours.  CBC: Recent Labs  Lab 05/20/19 1140 05/21/19 0600  WBC 7.9 5.9  NEUTROABS 6.3  --   HGB 7.5* 8.5*  HCT 24.1* 26.4*  MCV 99.6 95.0  PLT 170 188   Cardiac Enzymes: No results for input(s): CKTOTAL, CKMB, CKMBINDEX, TROPONINI in the last 168 hours. BNP (last 3 results) No results for input(s): PROBNP in the last 8760 hours. CBG: No results for input(s): GLUCAP in the last 168 hours. D-Dimer: No results for input(s): DDIMER in the last 72 hours. Hgb A1c: No results for input(s): HGBA1C in the last 72 hours. Lipid Profile: No results for input(s): CHOL, HDL, LDLCALC, TRIG, CHOLHDL, LDLDIRECT in the last 72 hours. Thyroid function studies: Recent Labs    05/21/19 0600  TSH 1.798   Anemia work up: Recent Labs    05/21/19 0600  FERRITIN 119  TIBC 281  IRON 42*   Sepsis Labs: Recent Labs  Lab 05/20/19 1140 05/21/19 0600  WBC 7.9 5.9  LATICACIDVEN 1.0  --      Microbiology Recent Results (from the past 240 hour(s))  SARS Coronavirus 2 [facility] order, Performed in [facility] hospital lab) Nasopharyngeal Nasopharyngeal Swab     Status: None   Collection Time: 05/20/19  8:57 PM   Specimen: Nasopharyngeal Swab  Result Value Ref Range Status   SARS Coronavirus 2 NEGATIVE NEGATIVE Final    Comment: (NOTE) If result is NEGATIVE SARS-CoV-2 target nucleic acids are NOT DETECTED. The SARS-CoV-2 RNA is generally detectable in upper and lower  respiratory specimens during the acute phase of infection. The lowest  concentration of SARS-CoV-2 viral copies this assay can detect is 250  copies /  mL. A negative result does not preclude SARS-CoV-2 infection  and should not be used as the sole basis for treatment or other  patient management decisions.  A negative result may occur with  improper specimen collection / handling, submission of specimen other  than nasopharyngeal swab, presence of viral mutation(s) within the  areas targeted by this assay, and inadequate number of viral copies  (<250 copies / mL). A negative result must be combined with clinical  observations, patient history, and epidemiological information. If result is POSITIVE SARS-CoV-2 target nucleic acids are DETECTED. The SARS-CoV-2 RNA is generally detectable in upper and lower  respiratory specimens dur ing the acute phase of infection.  Positive  results are indicative of active infection with SARS-CoV-2.  Clinical  correlation with patient history and other diagnostic information is  necessary to determine patient infection status.  Positive results do  not rule out bacterial infection or co-infection with other viruses. If result is PRESUMPTIVE POSTIVE SARS-CoV-2 nucleic acids MAY BE PRESENT.   A presumptive positive result was obtained on the submitted specimen  and confirmed on repeat testing.  While 2019 novel coronavirus  (SARS-CoV-2) nucleic acids may be present in the  submitted sample  additional confirmatory testing may be necessary for epidemiological  and / or clinical management purposes  to differentiate between  SARS-CoV-2 and other Sarbecovirus currently known to infect humans.  If clinically indicated additional testing with an alternate test  methodology [phone]) is advised. The SARS-CoV-2 RNA is generally  detectable in upper and lower respiratory sp ecimens during the acute  phase of infection. The expected result is Negative. Fact Sheet for Patients:  StrictlyIdeas.no Fact Sheet for Healthcare Providers: BankingDealers.co.za This test is not yet approved or cleared by the [place] FDA and has been authorized for detection and/or diagnosis of SARS-CoV-2 by FDA under an Emergency Use Authorization (EUA).  This EUA will remain in effect (meaning this test can be used) for the duration of the COVID-19 declaration under Section 564(b)(1) of the Act, 21 U.S.C. section 360bbb-3(b)(1), unless the authorization is terminated or revoked sooner. Performed at [facility], [place] [address], [place], [place] [address]     Procedures and diagnostic studies:  Dg Chest 2 View  Result Date: 05/20/2019 CLINICAL DATA:  Fever. EXAM: CHEST - 2 VIEW COMPARISON:  Radiographs of Feb 05, 2018. FINDINGS: The heart size and mediastinal contours are within normal limits. Atherosclerosis of thoracic aorta is noted. No pneumothorax is noted. Small pleural effusions and bibasilar atelectasis may be present. Old right rib fractures are noted. Status post lower thoracic kyphoplasty. IMPRESSION: Probable mild bibasilar subsegmental atelectasis is noted with small pleural effusions. Aortic Atherosclerosis (ICD10-I70.0). Electronically Signed   By: Marijo Conception M.D.   On: 05/20/2019 12:18    Medications:   . carbidopa-levodopa  1 tablet Oral 3 times per day  . cloNIDine  0.2 mg Oral 3 times per day  .  QUEtiapine  50 mg Oral q1800  . rosuvastatin  40 mg Oral q1800  . thiamine  100 mg Oral Daily   Continuous Infusions: . cefTRIAXone (ROCEPHIN)  IV Stopped (05/20/19 2343)     LOS: 0 days   Geradine Girt  [facility]   How to contact the [facility] Attending or Consulting provider [place] or covering provider during after hours [place], for this patient?  1. Check the care team in [facility] and look for a) attending/consulting [facility] provider listed and b) the [place]  team listed 2. Log into [email or web address] and use [place]'s universal password to access. If you do not have the password, please contact the hospital operator. 3. Locate the [facility] provider you are looking for under [facility] and page to a number that you can be directly reached. 4. If you still have difficulty reaching the provider, please page the [facility] (Director on Call) for the Hospitalists listed on amion for assistance.  05/21/2019, 12:08 PM

## 2019-05-21 NOTE — NC FL2 (Signed)
[place] LEVEL OF CARE SCREENING TOOL     IDENTIFICATION  Patient Name: Jose Baldwin Birthdate: Feb 28, 1929 Sex: male Admission Date (Current Location): 05/20/2019  [facility] and [place] Number:  [employer] and Address:  The [place]. [facility], [place] [address] [place], [place] [address]      Provider Number: [id number]  Attending Physician Name and Address:  Geradine Girt, DO  Relative Name and Phone Number:  [phone]    Current Level of Care: Hospital Recommended Level of Care: [place] Prior Approval Number:    Date Approved/Denied:   PASRR Number: [id number]  Discharge Plan: SNF    Current Diagnoses: Patient Active Problem List   Diagnosis Date Noted  . Uncontrolled hypertension 05/21/2019  . Normocytic anemia 05/21/2019  . CKD (chronic kidney disease), stage III ([place]) 05/21/2019  . Physical deconditioning 05/21/2019  . Hematuria 05/21/2019  . UTI (urinary tract infection) 05/20/2019  . Hypokalemia 04/20/2019  . Hypomagnesemia 04/20/2019  . Parkinson's disease ([place]) 04/20/2019  . Cardiac arrhythmia 04/20/2019  . Acute renal failure superimposed on stage 3 chronic kidney disease ([place]) 04/20/2019  . Protein-calorie malnutrition, severe 03/31/2019  . Cerebral embolism with cerebral infarction 03/30/2019  . POTS (postural orthostatic tachycardia syndrome) 03/28/2019  . HTN (hypertension) 02/04/2018  . Prostate cancer ([place]) 02/04/2018  . Orthostatic hypotension 02/04/2018  . Iron deficiency anemia 02/04/2018  . Syncope 02/02/2018    Orientation RESPIRATION BLADDER Height & Weight     Self, Time, Situation, Place  Normal Continent Weight: 127 lb 8 oz (57.8 kg) Height:  6\' 3"  (190.5 cm)  BEHAVIORAL SYMPTOMS/MOOD NEUROLOGICAL BOWEL NUTRITION STATUS      Continent Diet(see discharge summary)  AMBULATORY STATUS COMMUNICATION OF NEEDS Skin   Limited Assist Verbally (abrasions left and right legs, skin  tears left and right arms)                       Personal Care Assistance Level of Assistance  Bathing, Dressing, Total care, Feeding Bathing Assistance: Limited assistance Feeding assistance: Independent Dressing Assistance: Limited assistance Total Care Assistance: Limited assistance   Functional Limitations Info  Sight, Hearing, Speech Sight Info: Adequate Hearing Info: Adequate Speech Info: Adequate    SPECIAL CARE FACTORS FREQUENCY  PT (By licensed PT), OT (By licensed OT)     PT Frequency: min 5x weekly OT Frequency: min 5x weekly            Contractures Contractures Info: Not present    Additional Factors Info  Code Status, Allergies Code Status Info: DNR Allergies Info: No Known Allergies           Current Medications (05/21/2019):  This is the current hospital active medication list Current Facility-Administered Medications  Medication Dose Route Frequency Provider Last Rate Last Dose  . acetaminophen (TYLENOL) tablet 650 mg  650 mg Oral Q6H PRN Shela Leff, MD       Or  . acetaminophen (TYLENOL) suppository 650 mg  650 mg Rectal Q6H PRN Shela Leff, MD      . carbidopa-levodopa (SINEMET IR) 25-250 MG per tablet immediate release 1 tablet  1 tablet Oral 3 times per day Shela Leff, MD   1 tablet at 05/21/19 1501  . cefTRIAXone (ROCEPHIN) 1 g in sodium chloride 0.9 % 100 mL IVPB  1 g Intravenous Q24H Shela Leff, MD   Stopped at 05/20/19 2343  . cloNIDine (CATAPRES) tablet 0.2 mg  0.2 mg Oral 3  times per day Shela Leff, MD   0.2 mg at 05/21/19 1501  . feeding supplement (ENSURE ENLIVE) (ENSURE ENLIVE) liquid 237 mL  237 mL Oral BID BM Vann, Jessica U, DO      . hydrALAZINE (APRESOLINE) injection 5 mg  5 mg Intravenous Q4H PRN Shela Leff, MD   5 mg at 05/21/19 0052  . metoprolol tartrate (LOPRESSOR) injection 5 mg  5 mg Intravenous Q4H PRN Shela Leff, MD      . multivitamin with minerals tablet 1 tablet   1 tablet Oral Daily Vann, Jessica U, DO      . polyethylene glycol (MIRALAX / GLYCOLAX) packet 17 g  17 g Oral Daily PRN Shela Leff, MD      . QUEtiapine (SEROQUEL) tablet 50 mg  50 mg Oral q1800 Shela Leff, MD      . rosuvastatin (CRESTOR) tablet 40 mg  40 mg Oral q1800 Shela Leff, MD      . thiamine (VITAMIN B-1) tablet 100 mg  100 mg Oral Daily Shela Leff, MD   100 mg at 05/21/19 0915     Discharge Medications: Please see discharge summary for a list of discharge medications.  Relevant Imaging Results:  Relevant Lab Results:   Additional Information SSN: [id number]  Alberteen Sam, LCSW

## 2019-05-21 NOTE — Telephone Encounter (Signed)
Copied from [place] [phone]. Topic: Appointment Scheduling - Scheduling Inquiry for Clinic >> May 21, 2019  9:48 AM Jose Baldwin wrote: Reason for CRM: pt is 85 years old and needs hospital follow uti . Pt will be discharge today.   Unable to leave voicemail

## 2019-05-21 NOTE — Telephone Encounter (Signed)
Called Jose Baldwin and the voicemail is not set up to leave a message.   Trying to contact the patient to schedule a hospital follow up appointment with PCP

## 2019-05-21 NOTE — Progress Notes (Signed)
Initial Nutrition Assessment  RD working remotely.  DOCUMENTATION CODES:   Underweight  INTERVENTION:   -Feeding assistance with meals -MVI with minerals daily -Ensure Enlive po BID, each supplement provides 350 kcal and 20 grams of protein -Magic cup TID with meals, each supplement provides 290 kcal and 9 grams of protein -Downgrade diet to dysphagia 3 (advanced mechanical soft) for ease of intake  NUTRITION DIAGNOSIS:   Increased nutrient needs related to chronic illness(prostate cancer) as evidenced by estimated needs.  GOAL:   Patient will meet greater than or equal to 90% of their needs  MONITOR:   PO intake, Supplement acceptance, Labs, Weight trends, Skin, I & O's  REASON FOR ASSESSMENT:   Other (Comment)    ASSESSMENT:   Jose Baldwin is a 85 y.o. male with medical history significant of hypertension, prostate cancer presenting to the hospital for evaluation of bradycardia and hematuria. Patient states a month ago his blood pressure dropped and he passed out.  No complaints at present.  Denies headaches, chest pain, shortness of breath, cough, nausea, vomiting, abdominal pain, diarrhea, dysuria, or urinary frequency/urgency.  No additional history could be obtained from him.  Pt admitted with UTI.   Reviewed I/O's: +100 ml x 24 hours  Attempted to speak with pt via phone, however, no answer. RD unable to obtain further nutrition-related history at this time.   Pt with good appetite and consuming 100% of meals. Due to parkinson's disease, suspect pt would benefit from feeding assistance.  Wt has been stable over the past year. Pt with history of severe malnutrition and underweight status. Suspect malnutrition is ongoing, however, unable to identify at this time.   Medication reviewed and include thiamine.   Per MD notes, pt awaiting palliative care consult for goals of care discussions. Therapies recommending SNF placement.   Labs reviewed.   Diet Order:    Diet Order            DIET DYS 3 Room service appropriate? Yes; Fluid consistency: Thin  Diet effective now              EDUCATION NEEDS:   No education needs have been identified at this time  Skin:  Skin Assessment: Reviewed RN Assessment  Last BM:  05/19/19  Height:   Ht Readings from Last 1 Encounters:  05/21/19 6\' 3"  (1.905 m)    Weight:   Wt Readings from Last 1 Encounters:  05/21/19 57.8 kg    Ideal Body Weight:  89.1 kg  BMI:  Body mass index is 15.94 kg/m.  Estimated Nutritional Needs:   Kcal:  1550-1750  Protein:  75-90 grams  Fluid:  > 1.5 L    Carle Dargan A. Jimmye Norman, RD, LDN, [place] Registered Dietitian II Certified Diabetes Care and Education Specialist Pager: [phone] After hours Pager: [phone]

## 2019-05-21 NOTE — Progress Notes (Signed)
PHARMACY - PHYSICIAN COMMUNICATION CRITICAL VALUE ALERT - BLOOD CULTURE IDENTIFICATION (BCID)  Jose Baldwin is an 85 y.o. male who presented to [facility] on 05/20/2019 with a chief complaint of bradycardia + hematuria  Assessment:  89 YOM on antibiotics due to a concern for a UTI. Now with BCx growing GPC in 1 of 4 bottles with BCID detecting meth-R coagulase negative staph - likely reflective of a contaminant.   Name of physician (or Provider) Contacted: X Blount  Current antibiotics: Rocephin  Changes to prescribed antibiotics recommended:  BCID likely representative of a contaminant, continue current antibiotics while ruling out UTI  Results for orders placed or performed during the hospital encounter of 05/20/19  Blood Culture ID Panel (Reflexed) (Collected: 05/20/2019  8:58 PM)  Result Value Ref Range   Enterococcus species NOT DETECTED NOT DETECTED   Listeria monocytogenes NOT DETECTED NOT DETECTED   Staphylococcus species DETECTED (A) NOT DETECTED   Staphylococcus aureus (BCID) NOT DETECTED NOT DETECTED   Methicillin resistance DETECTED (A) NOT DETECTED   Streptococcus species NOT DETECTED NOT DETECTED   Streptococcus agalactiae NOT DETECTED NOT DETECTED   Streptococcus pneumoniae NOT DETECTED NOT DETECTED   Streptococcus pyogenes NOT DETECTED NOT DETECTED   Acinetobacter baumannii NOT DETECTED NOT DETECTED   Enterobacteriaceae species NOT DETECTED NOT DETECTED   Enterobacter cloacae complex NOT DETECTED NOT DETECTED   Escherichia coli NOT DETECTED NOT DETECTED   Klebsiella oxytoca NOT DETECTED NOT DETECTED   Klebsiella pneumoniae NOT DETECTED NOT DETECTED   Proteus species NOT DETECTED NOT DETECTED   Serratia marcescens NOT DETECTED NOT DETECTED   Haemophilus influenzae NOT DETECTED NOT DETECTED   Neisseria meningitidis NOT DETECTED NOT DETECTED   Pseudomonas aeruginosa NOT DETECTED NOT DETECTED   Candida albicans NOT DETECTED NOT DETECTED   Candida glabrata NOT DETECTED  NOT DETECTED   Candida krusei NOT DETECTED NOT DETECTED   Candida parapsilosis NOT DETECTED NOT DETECTED   Candida tropicalis NOT DETECTED NOT DETECTED    Alycia Rossetti, PharmD, BCPS Pager: [phone] 10:09 PM

## 2019-05-22 DIAGNOSIS — Z515 Encounter for palliative care: Secondary | ICD-10-CM

## 2019-05-22 DIAGNOSIS — R5381 Other malaise: Secondary | ICD-10-CM

## 2019-05-22 DIAGNOSIS — Z7189 Other specified counseling: Secondary | ICD-10-CM

## 2019-05-22 DIAGNOSIS — D649 Anemia, unspecified: Secondary | ICD-10-CM

## 2019-05-22 DIAGNOSIS — R001 Bradycardia, unspecified: Secondary | ICD-10-CM | POA: Diagnosis present

## 2019-05-22 DIAGNOSIS — N183 Chronic kidney disease, stage 3 (moderate): Secondary | ICD-10-CM

## 2019-05-22 LAB — BASIC METABOLIC PANEL
Anion gap: 8 (ref 5–15)
BUN: 34 mg/dL — ABNORMAL HIGH (ref 8–23)
CO2: 25 mmol/L (ref 22–32)
Calcium: 8.6 mg/dL — ABNORMAL LOW (ref 8.9–10.3)
Chloride: 104 mmol/L (ref 98–111)
Creatinine, Ser: 1.47 mg/dL — ABNORMAL HIGH (ref 0.61–1.24)
GFR calc Af Amer: 49 mL/min — ABNORMAL LOW (ref >60)
GFR calc non Af Amer: 42 mL/min — ABNORMAL LOW (ref >60)
Glucose, Bld: 102 mg/dL — ABNORMAL HIGH (ref 70–99)
Potassium: 3.7 mmol/L (ref 3.5–5.1)
Sodium: 137 mmol/L (ref 135–145)

## 2019-05-22 LAB — CBC
HCT: 22.4 % — ABNORMAL LOW (ref 39.0–52.0)
Hemoglobin: 7.1 g/dL — ABNORMAL LOW (ref 13.0–17.0)
MCH: 30.1 pg (ref 26.0–34.0)
MCHC: 31.7 g/dL (ref 30.0–36.0)
MCV: 94.9 fL (ref 80.0–100.0)
Platelets: 179 10*3/uL (ref 150–400)
RBC: 2.36 MIL/uL — ABNORMAL LOW (ref 4.22–5.81)
RDW: 14.7 % (ref 11.5–15.5)
WBC: 5.5 10*3/uL (ref 4.0–10.5)
nRBC: 0 % (ref 0.0–0.2)

## 2019-05-22 LAB — PREPARE RBC (CROSSMATCH)

## 2019-05-22 MED ORDER — AMLODIPINE BESYLATE 2.5 MG PO TABS
2.5000 mg | ORAL_TABLET | Freq: Every day | ORAL | Status: DC
  Administered 2019-05-22 – 2019-05-25 (×4): 2.5 mg via ORAL
  Filled 2019-05-22 (×4): qty 1

## 2019-05-22 MED ORDER — SODIUM CHLORIDE 0.9% IV SOLUTION: Freq: Once | INTRAVENOUS | Status: DC

## 2019-05-22 MED ORDER — INFLUENZA VAC A&B SA ADJ QUAD 0.5 ML IM PRSY
0.5000 mL | PREFILLED_SYRINGE | INTRAMUSCULAR | Status: AC
  Administered 2019-05-23: 0.5 mL via INTRAMUSCULAR
  Filled 2019-05-22: qty 0.5

## 2019-05-22 NOTE — Progress Notes (Signed)
Addendum  I discussed again with patient's son regarding hemoglobin of 7.1 and recommendation to transfuse PRBCs.  He reported that patient had been transfused blood even during a previous hospitalization and consented to blood transfusion.  We will transfuse 1 unit PRBC and follow CBC in a.m. Consider Aranesp as OP.  Vernell Leep, MD, FACP, [facility]. [facility]  To contact the attending provider between 7A-7P or the covering provider during after hours 7P-7A, please log into the web site [email or web address] and access using universal [place] password for that web site. If you do not have the password, please call the hospital operator.

## 2019-05-22 NOTE — Progress Notes (Signed)
Pt continues to have elevated SBP.160. PRN dose of hydralazine x1 @ 0255.  RN rechecked BP and continues to be elevated.  Pt denies any dizziness, headache, and/or N/V.  Dr Kennon Holter notified paged via [place].  Also, RN was notified at 0350 of ST elevation per CCMD.  Pt denies any chest pain or any other type of pain during this time.  EKG performed by RN.  No significant changes from previous EKG scans.

## 2019-05-22 NOTE — Evaluation (Signed)
Occupational Therapy Evaluation Patient Details Name: Jose Baldwin MRN: [id number] DOB: Jan 31, 1929 Today's Date: 05/22/2019    History of Present Illness Jose Baldwin is an 85 y.o. male with medical history significant for hypertension, prostate cancer,Parkinson's disease, POTS, frequent falls, and prior CVA  presenting to the hospital for evaluation of bradycardia and hematuria.  Work up and treatment for uti.   Clinical Impression   This 85 yo male admitted with above presents to acute OT with slowness of movement, decreased mobility, decreased balance, decreased safety awareness all affecting his ability to do his basic ADLs. He will benefit from acute OT with follow up at SNF.    Follow Up Recommendations  SNF;Supervision/Assistance - 24 hour    Equipment Recommendations  Other (comment)(TBD at next venue)       Precautions / Restrictions Precautions Precautions: Fall Restrictions Weight Bearing Restrictions: No      Mobility Bed Mobility Overal bed mobility: Needs Assistance Bed Mobility: Supine to Sit;Sit to Supine     Supine to sit: Min assist;HOB elevated Sit to supine: Min assist      Transfers Overall transfer level: Needs assistance Equipment used: Rolling walker (2 wheeled) Transfers: Sit to/from Stand Sit to Stand: Min assist              Balance Overall balance assessment: Needs assistance Sitting-balance support: No upper extremity supported;Feet supported Sitting balance-Leahy Scale: Fair       Standing balance-Leahy Scale: Poor Standing balance comment: abel to stand with +1 A for ~5 minutes while getting cleaned up from bowel movement with RW in front                           ADL either performed or assessed with clinical judgement   ADL Overall ADL's : Needs assistance/impaired Eating/Feeding: Independent;Bed level   Grooming: Set up;Sitting;Supervision/safety;Wash/dry face;Wash/dry hands   Upper Body Bathing: Moderate  assistance;Sitting   Lower Body Bathing: Maximal assistance Lower Body Bathing Details (indicate cue type and reason): Min A sit<>stand Upper Body Dressing : Moderate assistance;Sitting   Lower Body Dressing: Total assistance Lower Body Dressing Details (indicate cue type and reason): Min A sit<>stand Toilet Transfer: Minimal assistance;RW Toilet Transfer Details (indicate cue type and reason): side step up to [place] and Hygiene: Total assistance Toileting - Clothing Manipulation Details (indicate cue type and reason): Min A sit<>stand             Vision Patient Visual Report: No change from baseline              Pertinent Vitals/Pain Pain Assessment: No/denies pain     Hand Dominance  right   Extremity/Trunk Assessment Upper Extremity Assessment Upper Extremity Assessment: Generalized weakness           Communication  slowness to respond and in a low tone of voice   Cognition Arousal/Alertness: Awake/alert Behavior During Therapy: WFL for tasks assessed/performed Overall Cognitive Status: Within Functional Limits for tasks assessed                                                Home Living Family/patient expects to be discharged to:: Skilled nursing facility   Available Help at Discharge: Family;Available PRN/intermittently Type of Home: Mobile home Home Access: Ramped entrance     Home Layout: One  level     Bathroom Shower/Tub: Chief Strategy Officer: Shower seat          Prior Functioning/Environment Level of Independence: Needs assistance  Gait / Transfers Assistance Needed: RW for gait in/outdoor.              OT Problem List: Decreased strength;Impaired balance (sitting and/or standing)      OT Treatment/Interventions: Self-care/ADL training;DME and/or AE instruction;Patient/family education;Balance training    OT Goals(Current goals can be found in the care plan  section) Acute Rehab OT Goals OT Goal Formulation: With patient Time For Goal Achievement: 06/05/19 Potential to Achieve Goals: Fair  OT Frequency: Min 2X/week              AM-PAC OT "6 Clicks" Daily Activity     Outcome Measure Help from another person eating meals?: None Help from another person taking care of personal grooming?: A Little Help from another person toileting, which includes using toliet, bedpan, or urinal?: A Lot Help from another person bathing (including washing, rinsing, drying)?: A Lot Help from another person to put on and taking off regular upper body clothing?: A Lot Help from another person to put on and taking off regular lower body clothing?: Total 6 Click Score: 14   End of Session Equipment Utilized During Treatment: Rolling walker  Activity Tolerance: Patient tolerated treatment well Patient left: in bed;with call bell/phone within reach;with bed alarm set  OT Visit Diagnosis: Unsteadiness on feet (R26.81);Other abnormalities of gait and mobility (R26.89);Muscle weakness (generalized) (M62.81)                Time: [id number] OT Time Calculation (min): 28 min Charges:  OT General Charges $OT Visit: 1 Visit OT Evaluation $OT Eval Moderate Complexity: 1 Mod OT Treatments $Self Care/Home Management : 8-22 mins  [place] [address], OTR/L Acute [employer] Pager [phone] Office [phone]    Almon Register 05/22/2019, 4:26 PM

## 2019-05-22 NOTE — Progress Notes (Addendum)
PROGRESS NOTE   Jose Baldwin  [id number]    DOB: 1928/10/31    DOA: 05/20/2019  PCP: Billie Ruddy, MD   I have briefly reviewed patients previous medical records in [facility].  Chief Complaint  Patient presents with  . Hematuria  Bradycardia  Brief Narrative:  85 year old male, lives alone but his son has been staying with him for the last several months since the COVID-19 pandemic, ambulates with the help of a walker, PMH of HTN, prostate cancer s/p radiation seeds, Parkinson's disease, POTS, frequent falls, prior CVA, presented to the ED on 9/9 after his home health RN noticed bradycardia of 32/min which was verified by repeated testing, patient was advised to come to the ED same day but he refused and was brought in by his son the next day.  Also reported intermittent hematuria of 6 weeks duration.  Bradycardia suspected due to Cardizem and clonidine, Cardizem discontinued and bradycardia has resolved.  No gross hematuria or clinical UTI, discontinued ceftriaxone.  Adult failure to thrive, PMT consulted and SNF pending.   Assessment & Plan:   Principal Problem:   Bradycardia Active Problems:   UTI (urinary tract infection)   Uncontrolled hypertension   Normocytic anemia   CKD (chronic kidney disease), stage III (HCC)   Physical deconditioning   Hematuria   Bradycardia  Heart rate reportedly went down to as low as 32 bpm on day prior to admission as verified by home health RN.  Likely secondary to Cardizem and clonidine.  Cardizem discontinued.  Resolved.  Avoid adding new chronotropic medications.  TSH and free T4 normal.  Intermittent hematuria  Reportedly ongoing for 6 weeks in the absence of dysuria, pain, fever or chills.  Completed a 5 days course of oral ciprofloxacin for presumed UTI recently.  History of prostate cancer treated with radiation seeds several years ago by a physician in [place]  No clinical UTI.  Preliminary urine culture shows  gram-positive cocci in clusters which is possibly colonization.  Completed 2 days of IV ceftriaxone, discontinued after 9/10 dose.  Discussed with patient's son in detail and recommended outpatient urology consultation and follow-up if aggressive management is desired.  He verbalized understanding.  Currently no gross hematuria.  Follow-up urine cytology if sample sent and processed by lab.  1 of 4 blood cultures positive for MRSA coagulase-negative Staphylococcus  Likely contamination.  No further work-up or management.  Essential hypertension  Uncontrolled on clonidine 0.2 mg 3 times daily.  Cardizem discontinued due to significant bradycardia on admission.  As per son, patient tolerated amlodipine in the past without worsening of orthostatic symptoms.  Started amlodipine 2.5 mg daily.  Unable to start ACEI or ARB due to renal insufficiency.  No beta-blockers due to issues with bradycardia.  Discontinued IV metoprolol.  Stage III chronic kidney disease  Creatinine has been widely fluctuating, 1.7-1.8 lately.  Down to 1.47 today and likely his baseline.  Renal ultrasound 8/10: Compatible with medical renal disease.  No concerns for mass or hydronephrosis.  Anemia of chronic disease, possible iron deficiency and chronic kidney disease.  Anemia panel: Iron 42, TIBC 281, saturation ratio 15, ferritin 119.  Folate in July was 20.7 and B12 in July was 814.  Baseline hemoglobin fluctuates between mid 7 to mid 8 lately.  Hemoglobin has dropped to 7.1 today in the absence of overt bleeding.  This may be contributing also to weakness and near syncopal episodes.  Consider transfusing a unit of PRBC for symptomatic improvement.  Plan to discuss with son.  Parkinson's disease with associated POTS  Continue Sinemet.  As per extensive discussion with son, does not really have complete syncopal episodes.  Has been having episodes frequently in the evenings lately where after sitting for  prolonged period of time when he stands up to go to bed, he becomes tremulous, stares blankly in space, less responsive but no LOC and then resolves after laying down after 5 to 7 minutes and he has no recollection of the event or incontinence.  Son has been told that this is due to Parkinson's related orthostatic changes.  Was on Florinef in the past which was discontinued.  Frequent falls/physical deconditioning  Secondary to advanced age, frail physical health, Parkinson's disease and POTS  Son wants patient to go to SNF for short-term rehab followed by LTC.  Dementia  Without agitation.  Continue prior home dose of Seroquel.  Adult failure to thrive  Multifactorial related to advanced age, frail physical health, multiple significant comorbidities.  Son is agreeable for PMT consultation which can be either achieved while hospitalized if can be done in a timely manner or can be pursued at [facility].  Son agreeable.  History of prostate cancer  Outpatient follow-up with urology   DVT prophylaxis: SCDs Code Status: DNR Family Communication: Cussed in detail with patient's son via phone, updated care and answered all questions. Disposition: DC to SNF pending clinical improvement.   Consultants:  PMT  Procedures:  None  Antimicrobials:  IV ceftriaxone 9/9-9/10    Subjective: Patient is awake, alert, oriented to person, place and time.  However seems to be confused at times.  He tells me that he came in because he passed out on day of admission which the son clearly disagreed with.  Patient denies complaints.  Denies pain, denies dysuria, fever or chills.  He independently finished eating almost all of his breakfast this morning.  As per RN, ambulated yesterday with the help of a walker and supervision without dizziness or lightheadedness.  Objective:  Vitals:   05/22/19 0450 05/22/19 0529 05/22/19 0600 05/22/19 0856  BP: (!) 178/81 (!) 181/79  136/60  Pulse: 79 73  79  Resp:  18 19  18   Temp:  98.4 F (36.9 C)  99.2 F (37.3 C)  TempSrc:  Oral  Oral  SpO2:    100%  Weight:   57.2 kg   Height:        Examination:  General exam: Pleasant elderly male, moderately built and frail lying comfortably propped up in bed without distress. Respiratory system: Clear to auscultation. Respiratory effort normal. Cardiovascular system: S1 & S2 heard, RRR. No JVD, murmurs, rubs, gallops or clicks. No pedal edema.  Telemetry personally reviewed: Sinus rhythm with BBB morphology and frequent PACs, PVCs. Gastrointestinal system: Abdomen is nondistended, soft and nontender. No organomegaly or masses felt. Normal bowel sounds heard. Central nervous system: Alert and oriented x3. No focal neurological deficits. Extremities: Symmetric 5 x 5 power.  Bilateral upper extremities with couple of superficial skin tears with dressing clean and dry.   Skin: No rashes, lesions or ulcers Psychiatry: Judgement and insight appear impaired mood & affect flat.     Data Reviewed: I have personally reviewed following labs and imaging studies  CBC: Recent Labs  Lab 05/20/19 1140 05/21/19 0600 05/22/19 0110  WBC 7.9 5.9 5.5  NEUTROABS 6.3  --   --   HGB 7.5* 8.5* 7.1*  HCT 24.1* 26.4* 22.4*  MCV 99.6 95.0 94.9  PLT 170 188 0000000   Basic Metabolic Panel: Recent Labs  Lab 05/20/19 1140 05/20/19 1900 05/22/19 0110  NA 138  --  137  K 4.1  --  3.7  CL 108  --  104  CO2 23  --  25  GLUCOSE 98  --  102*  BUN 43*  --  34*  CREATININE 1.66*  --  1.47*  CALCIUM 8.5*  --  8.6*  MG  --  1.8  --    Liver Function Tests: Recent Labs  Lab 05/20/19 1140  AST 10*  ALT 5  ALKPHOS 56  BILITOT 0.3  PROT 5.6*  ALBUMIN 2.9*    Cardiac Enzymes: No results for input(s): CKTOTAL, CKMB, CKMBINDEX, TROPONINI in the last 168 hours.  CBG: No results for input(s): GLUCAP in the last 168 hours.  Recent Results (from the past 240 hour(s))  Urine culture     Status: None (Preliminary  result)   Collection Time: 05/20/19  6:03 PM   Specimen: Urine, Random  Result Value Ref Range Status   Specimen Description URINE, RANDOM  Final   Special Requests Normal  Final   Culture   Final    CULTURE REINCUBATED FOR BETTER GROWTH Performed at [facility], [address] [place], [place] [address]    Report Status PENDING  Incomplete  SARS Coronavirus 2 [facility] order, Performed in [facility] hospital lab) Nasopharyngeal Nasopharyngeal Swab     Status: None   Collection Time: 05/20/19  8:57 PM   Specimen: Nasopharyngeal Swab  Result Value Ref Range Status   SARS Coronavirus 2 NEGATIVE NEGATIVE Final    Comment: (NOTE) If result is NEGATIVE SARS-CoV-2 target nucleic acids are NOT DETECTED. The SARS-CoV-2 RNA is generally detectable in upper and lower  respiratory specimens during the acute phase of infection. The lowest  concentration of SARS-CoV-2 viral copies this assay can detect is 250  copies / mL. A negative result does not preclude SARS-CoV-2 infection  and should not be used as the sole basis for treatment or other  patient management decisions.  A negative result may occur with  improper specimen collection / handling, submission of specimen other  than nasopharyngeal swab, presence of viral mutation(s) within the  areas targeted by this assay, and inadequate number of viral copies  (<250 copies / mL). A negative result must be combined with clinical  observations, patient history, and epidemiological information. If result is POSITIVE SARS-CoV-2 target nucleic acids are DETECTED. The SARS-CoV-2 RNA is generally detectable in upper and lower  respiratory specimens dur ing the acute phase of infection.  Positive  results are indicative of active infection with SARS-CoV-2.  Clinical  correlation with patient history and other diagnostic information is  necessary to determine patient infection status.  Positive results do  not rule out bacterial infection  or co-infection with other viruses. If result is PRESUMPTIVE POSTIVE SARS-CoV-2 nucleic acids MAY BE PRESENT.   A presumptive positive result was obtained on the submitted specimen  and confirmed on repeat testing.  While 2019 novel coronavirus  (SARS-CoV-2) nucleic acids may be present in the submitted sample  additional confirmatory testing may be necessary for epidemiological  and / or clinical management purposes  to differentiate between  SARS-CoV-2 and other Sarbecovirus currently known to infect humans.  If clinically indicated additional testing with an alternate test  methodology [phone]) is advised. The SARS-CoV-2 RNA is generally  detectable in upper and lower respiratory sp ecimens during the  acute  phase of infection. The expected result is Negative. Fact Sheet for Patients:  StrictlyIdeas.no Fact Sheet for Healthcare Providers: BankingDealers.co.za This test is not yet approved or cleared by the [place] FDA and has been authorized for detection and/or diagnosis of SARS-CoV-2 by FDA under an Emergency Use Authorization (EUA).  This EUA will remain in effect (meaning this test can be used) for the duration of the COVID-19 declaration under Section 564(b)(1) of the Act, 21 U.S.C. section 360bbb-3(b)(1), unless the authorization is terminated or revoked sooner. Performed at [facility], [place] [address] [place], [place] [address]   Culture, blood (Routine x 2)     Status: None (Preliminary result)   Collection Time: 05/20/19  8:58 PM   Specimen: BLOOD  Result Value Ref Range Status   Specimen Description BLOOD LEFT ARM  Final   Special Requests   Final    BOTTLES DRAWN AEROBIC AND ANAEROBIC Blood Culture adequate volume   Culture  Setup Time   Final    GRAM POSITIVE COCCI IN CLUSTERS IN BOTH AEROBIC AND ANAEROBIC BOTTLES CRITICAL RESULT CALLED TO, READ BACK BY AND VERIFIED WITH: E. MARTIN,PHARMD 2156 05/21/2019  Mena Goes Performed at [facility], [place] [address], [place], [place] [address]    Culture GRAM POSITIVE COCCI  Final   Report Status PENDING  Incomplete  Blood Culture ID Panel (Reflexed)     Status: Abnormal   Collection Time: 05/20/19  8:58 PM  Result Value Ref Range Status   Enterococcus species NOT DETECTED NOT DETECTED Final   Listeria monocytogenes NOT DETECTED NOT DETECTED Final   Staphylococcus species DETECTED (A) NOT DETECTED Final    Comment: Methicillin (oxacillin) resistant coagulase negative staphylococcus. Possible blood culture contaminant (unless isolated from more than one blood culture draw or clinical case suggests pathogenicity). No antibiotic treatment is indicated for blood  culture contaminants. CRITICAL RESULT CALLED TO, READ BACK BY AND VERIFIED WITH: E. MARTIN,PHARMD 2156 05/21/2019 T. TYSOR    Staphylococcus aureus (BCID) NOT DETECTED NOT DETECTED Final   Methicillin resistance DETECTED (A) NOT DETECTED Final    Comment: CRITICAL RESULT CALLED TO, READ BACK BY AND VERIFIED WITH: E. MARTIN,PHARMD 2156 05/21/2019 T. TYSOR    Streptococcus species NOT DETECTED NOT DETECTED Final   Streptococcus agalactiae NOT DETECTED NOT DETECTED Final   Streptococcus pneumoniae NOT DETECTED NOT DETECTED Final   Streptococcus pyogenes NOT DETECTED NOT DETECTED Final   Acinetobacter baumannii NOT DETECTED NOT DETECTED Final   Enterobacteriaceae species NOT DETECTED NOT DETECTED Final   Enterobacter cloacae complex NOT DETECTED NOT DETECTED Final   Escherichia coli NOT DETECTED NOT DETECTED Final   Klebsiella oxytoca NOT DETECTED NOT DETECTED Final   Klebsiella pneumoniae NOT DETECTED NOT DETECTED Final   Proteus species NOT DETECTED NOT DETECTED Final   Serratia marcescens NOT DETECTED NOT DETECTED Final   Haemophilus influenzae NOT DETECTED NOT DETECTED Final   Neisseria meningitidis NOT DETECTED NOT DETECTED Final   Pseudomonas aeruginosa NOT DETECTED NOT DETECTED  Final   Candida albicans NOT DETECTED NOT DETECTED Final   Candida glabrata NOT DETECTED NOT DETECTED Final   Candida krusei NOT DETECTED NOT DETECTED Final   Candida parapsilosis NOT DETECTED NOT DETECTED Final   Candida tropicalis NOT DETECTED NOT DETECTED Final    Comment: Performed at [facility], [place]. [address], [place], [place] [address]         Radiology Studies: Dg Chest 2 View  Result Date: 05/20/2019 CLINICAL  DATA:  Fever. EXAM: CHEST - 2 VIEW COMPARISON:  Radiographs of Feb 05, 2018. FINDINGS: The heart size and mediastinal contours are within normal limits. Atherosclerosis of thoracic aorta is noted. No pneumothorax is noted. Small pleural effusions and bibasilar atelectasis may be present. Old right rib fractures are noted. Status post lower thoracic kyphoplasty. IMPRESSION: Probable mild bibasilar subsegmental atelectasis is noted with small pleural effusions. Aortic Atherosclerosis (ICD10-I70.0). Electronically Signed   By: Marijo Conception M.D.   On: 05/20/2019 12:18        Scheduled Meds: . amLODipine  2.5 mg Oral Daily  . carbidopa-levodopa  1 tablet Oral 3 times per day  . cloNIDine  0.2 mg Oral 3 times per day  . feeding supplement (ENSURE ENLIVE)  237 mL Oral BID BM  . multivitamin with minerals  1 tablet Oral Daily  . QUEtiapine  50 mg Oral q1800  . rosuvastatin  40 mg Oral q1800  . thiamine  100 mg Oral Daily   Continuous Infusions: . sodium chloride 250 mL (05/21/19 2133)  . cefTRIAXone (ROCEPHIN)  IV 1 g (05/21/19 2134)     LOS: 1 day     Vernell Leep, MD, FACP, [facility]. [facility]  To contact the attending provider between 7A-7P or the covering provider during after hours 7P-7A, please log into the web site [email or web address] and access using universal [place] password for that web site. If you do not have the password, please call the hospital operator.  05/22/2019, 10:20 AM

## 2019-05-22 NOTE — Consult Note (Signed)
Consultation Note Date: 05/22/2019   Patient Name: Jose Baldwin  DOB: [birth year]  MRN: [id number]  Age / Sex: 85 y.o., male  PCP: Billie Ruddy, MD Referring Physician: Modena Jansky, MD  Reason for Consultation: Establishing goals of care  HPI/Patient Profile: 85 y.o. male  with past medical history of HTN, prostate CA, Parkinson's, multiple falls, CVA, and CKD admitted on 05/20/2019 with bradycardia and hematuria. Patient stopped cardizem and bradycardia has resolved. Overall, with adult failure to thrive. PMT consulted for [place].   Clinical Assessment and Goals of Care: I have reviewed medical records including EPIC notes, labs and imaging, and received report from  RN - patient much improved today compared to yesterday. Eating well. No concerns from RN.  I then spoke with patient's son  to discuss diagnosis prognosis, [place], EOL wishes, disposition and options.  I introduced Palliative Medicine as specialized medical care for people living with serious illness. It focuses on providing relief from the symptoms and stress of a serious illness. The goal is to improve quality of life for both the patient and the family.  We discussed a brief life review of the patient. [name] shares that patient worked as an Chief Financial Officer. He has 3 children; however, [name] serves as Advertising copywriter. [name] has been living with the patient since the start of pandemic - he tells me he cannot leave the patient alone d/t falls and safety concerns.  As far as functional and nutritional status, [name] tells me that patient has difficulty ambulating - usually unable to stand without assistance but once he is standing he can ambulate to the bathroom with a walker. Occasionally patient will get up unassisted but this is usually in the evenings when he is more confused/agitated. [name] tells me the patient still eats well - some coughing with swallowing. In the past  couple of weeks [name] has noticed a decrease in liquid intake.     We discussed his current illness and what it means in the larger context of his on-going co-morbidities.  Natural disease trajectory and expectations at EOL were discussed. We discussed the patient's Parkinson's and progression. Discussed what to look for as patient approaches end stages.   I attempted to elicit values and goals of care important to the patient.  [name] is very focused on not prolonging suffering for the patient. He is very interested in hospice services whenever the patient is eligible.  The difference between aggressive medical intervention and comfort care was considered in light of the patient's goals of care. [name] would like to avoid aggressive medical interventions.  We discussed ongoing hematuria - [name] is not interested in any further work-up - no urology follow up, no cystoscopy.   [name] asks about cessation of patient's Parkinson's meds - we discussed that this may cause great sacrifice in patient's quality of life - [name] agrees. We discussed that this may become more appropriate later on as disease progresses.   Discussed many different disposition options - patient is certainly not hospice facility appropriate at this point. He may be hospice eligible or at least nearing that time - however, this care would be given in the home and [name] feels that the patient needs facility placement - at least short-term - while [name] is able to figure out how to move forward with his care. We discussed that patient going to rehab and have palliative care outpatient participate in his care and help [name] assess when the patient becomes hospice appropriate and  the best place for him to receive this care. [name] was very excited about continued involvement of palliative care to assist him.   Questions and concerns were addressed.  Hard Choices booklet left for review. The family was encouraged to call with  questions or concerns.   Primary Decision Maker NEXT OF KIN - has 3 sons however son Evangeline Gula is main caregiver and most involved in care - other 2 defer to him  SUMMARY OF RECOMMENDATIONS   - Son is very invested in continued palliative involvement outpatient - I have placed CSW consult for outpatient palliative to follow at SNF (choice is [facility]) - please write for this in discharge summary - Son would like to proceed with SNF placement (at least short term) - if patient declines at SNF son may bring him home with hospice, if he stabilizes for a while they may move forward with long term care in facility - he will appreciate support of palliative care navigating this outpatient - Goals are to avoid aggressive medical interventions, focus on quality of life and not prolong suffering - will likely decline any further hospitalizations unless needed for symptom management - no further work-up for hematuria - no urology visit or cystoscopy  Code Status/Advance Care Planning:  DNR  Psycho-social/Spiritual:   Desire for further Chaplaincy support:no  Additional Recommendations: Education on Hospice  Prognosis:   Unable to determine   Discharge Planning: [place] for rehab with Palliative care service follow-up      Primary Diagnoses: Present on Admission: . UTI (urinary tract infection) . Hematuria . Bradycardia   I have reviewed the medical record, interviewed the patient and family, and examined the patient. The following aspects are pertinent.  Past Medical History:  Diagnosis Date  . Hypertension   . Prostate cancer ([place])   . Syncope    Social History   Socioeconomic History  . Marital status: Single    Spouse name: Not on file  . Number of children: 3  . Years of education: Not on file  . Highest education level: Not on file  Occupational History  . Occupation: retired  Scientific laboratory technician  . Financial resource strain: Not on file  . Food insecurity     Worry: Not on file    Inability: Not on file  . Transportation needs    Medical: Not on file    Non-medical: Not on file  Tobacco Use  . Smoking status: Former Smoker    Packs/day: 2.00    Years: 30.00    Pack years: 60.00    Types: Cigarettes    Quit date: 1990    Years since quitting: 30.7  . Smokeless tobacco: Never Used  Substance and Sexual Activity  . Alcohol use: Yes    Alcohol/week: 14.0 standard drinks    Types: 14 Cans of beer per week  . Drug use: Never  . Sexual activity: Not on file  Lifestyle  . Physical activity    Days per week: Not on file    Minutes per session: Not on file  . Stress: Not on file  Relationships  . Social Herbalist on phone: Not on file    Gets together: Not on file    Attends religious service: Not on file    Active member of club or organization: Not on file    Attends meetings of clubs or organizations: Not on file    Relationship status: Not on file  Other Topics Concern  .  Not on file  Social History Narrative  . Not on file   Family History  Problem Relation Age of Onset  . Heart attack Father 7   Scheduled Meds: . amLODipine  2.5 mg Oral Daily  . carbidopa-levodopa  1 tablet Oral 3 times per day  . cloNIDine  0.2 mg Oral 3 times per day  . feeding supplement (ENSURE ENLIVE)  237 mL Oral BID BM  . multivitamin with minerals  1 tablet Oral Daily  . QUEtiapine  50 mg Oral q1800  . rosuvastatin  40 mg Oral q1800  . thiamine  100 mg Oral Daily   Continuous Infusions: . sodium chloride 250 mL (05/21/19 2133)   PRN Meds:.sodium chloride, acetaminophen **OR** acetaminophen, hydrALAZINE, polyethylene glycol No Known Allergies  Vital Signs: BP 136/60 (BP Location: Right Arm)   Pulse 79   Temp 99.2 F (37.3 C) (Oral)   Resp 18   Ht 6\' 3"  (1.905 m)   Wt 57.2 kg   SpO2 100%   BMI 15.75 kg/m  Pain Scale: 0-10   Pain Score: 0-No pain   SpO2: SpO2: 100 % O2 Device:SpO2: 100 % O2 Flow Rate: .   IO:  Intake/output summary:   Intake/Output Summary (Last 24 hours) at 05/22/2019 1506 Last data filed at 05/22/2019 1457 Gross per 24 hour  Intake 1577.48 ml  Output 2200 ml  Net -622.52 ml    LBM: Last BM Date: 05/21/19 Baseline Weight: Weight: 60 kg Most recent weight: Weight: 57.2 kg     Palliative Assessment/Data: PPS 40%    The above conversation was completed via telephone due to the visitor restrictions during the COVID-19 pandemic. Thorough chart review and discussion with necessary members of the care team was completed as part of assessment. All issues were discussed and addressed but no physical exam was performed.    Time Total: 100 minutes (245-415) Greater than 50%  of this time was spent counseling and coordinating care related to the above assessment and plan.  Juel Burrow, DNP, AGNP-C Palliative Medicine Team [phone] Pager: [phone]

## 2019-05-22 NOTE — Progress Notes (Signed)
OT Cancellation Note  Patient Details Name: Jose Baldwin MRN: [id number] DOB: Oct 15, 1928   Cancelled Treatment:    Reason Eval/Treat Not Completed: Other (comment). Pt in middle of eating his lunch.  [place] Circle, OTR/L Acute Rehab Services Pager [phone] Office [phone]     Almon Register 05/22/2019, 2:13 PM

## 2019-05-23 LAB — URINE CULTURE
Culture: >=100000 — AB
Special Requests: NORMAL

## 2019-05-23 LAB — BASIC METABOLIC PANEL
Anion gap: 8 (ref 5–15)
BUN: 28 mg/dL — ABNORMAL HIGH (ref 8–23)
CO2: 25 mmol/L (ref 22–32)
Calcium: 8.8 mg/dL — ABNORMAL LOW (ref 8.9–10.3)
Chloride: 104 mmol/L (ref 98–111)
Creatinine, Ser: 1.51 mg/dL — ABNORMAL HIGH (ref 0.61–1.24)
GFR calc Af Amer: 47 mL/min — ABNORMAL LOW (ref >60)
GFR calc non Af Amer: 41 mL/min — ABNORMAL LOW (ref >60)
Glucose, Bld: 95 mg/dL (ref 70–99)
Potassium: 4 mmol/L (ref 3.5–5.1)
Sodium: 137 mmol/L (ref 135–145)

## 2019-05-23 LAB — CULTURE, BLOOD (ROUTINE X 2): Special Requests: ADEQUATE

## 2019-05-23 LAB — BPAM RBC
Blood Product Expiration Date: 202010152359
ISSUE DATE / TIME: 202009112258
Unit Type and Rh: 5100

## 2019-05-23 LAB — TYPE AND SCREEN
ABO/RH(D): O POS
Antibody Screen: NEGATIVE
Unit division: 0

## 2019-05-23 LAB — CBC
HCT: 26.6 % — ABNORMAL LOW (ref 39.0–52.0)
Hemoglobin: 8.5 g/dL — ABNORMAL LOW (ref 13.0–17.0)
MCH: 30.5 pg (ref 26.0–34.0)
MCHC: 32 g/dL (ref 30.0–36.0)
MCV: 95.3 fL (ref 80.0–100.0)
Platelets: 170 10*3/uL (ref 150–400)
RBC: 2.79 MIL/uL — ABNORMAL LOW (ref 4.22–5.81)
RDW: 14.2 % (ref 11.5–15.5)
WBC: 5.2 10*3/uL (ref 4.0–10.5)
nRBC: 0 % (ref 0.0–0.2)

## 2019-05-23 NOTE — Progress Notes (Signed)
PROGRESS NOTE   Jose Baldwin  [id number]    DOB: 1929/02/08    DOA: 05/20/2019  PCP: Billie Ruddy, MD   I have briefly reviewed patients previous medical records in [facility].  Chief Complaint  Patient presents with  . Hematuria  Bradycardia  Brief Narrative:  85 year old male, lives alone but his son has been staying with him for the last several months since the COVID-19 pandemic, ambulates with the help of a walker, PMH of HTN, prostate cancer s/p radiation seeds, Parkinson's disease, POTS, frequent falls, prior CVA, presented to the ED on 9/9 after his home health RN noticed bradycardia of 32/min which was verified by repeated testing, patient was advised to come to the ED same day but he refused and was brought in by his son the next day.  Also reported intermittent hematuria of 6 weeks duration.  Bradycardia suspected due to Cardizem and clonidine, Cardizem discontinued and bradycardia has resolved.  No gross hematuria or clinical UTI, discontinued ceftriaxone.  Adult failure to thrive, PMT consulted and SNF pending.  S/p 1 unit PRBC on 9/11 for Hb 7.1.  Medically stable for DC to SNF pending bed/insurance clearance.   Assessment & Plan:   Principal Problem:   Bradycardia Active Problems:   Parkinson disease ([place])   UTI (urinary tract infection)   Uncontrolled hypertension   Normocytic anemia   CKD (chronic kidney disease), stage III (HCC)   Physical deconditioning   Hematuria   Goals of care, counseling/discussion   Palliative care by specialist   Bradycardia  Heart rate reportedly went down to as low as 32 bpm on day prior to admission as verified by home health RN.  Likely secondary to Cardizem and clonidine.  Cardizem discontinued.  Avoid adding new chronotropic medications.  TSH and free T4 normal.  Resolved without recurrence.  Telemetry discontinued.  Intermittent hematuria  Reportedly ongoing for 6 weeks in the absence of dysuria, pain, fever  or chills.  Completed a 5 days course of oral ciprofloxacin for presumed UTI recently.  History of prostate cancer treated with radiation seeds several years ago by a physician in [place]  No clinical UTI.  Preliminary urine culture shows gram-positive cocci in clusters which is possibly colonization.  Completed 2 days of IV ceftriaxone, discontinued after 9/10 dose.  Discussed with patient's son in detail and recommended outpatient urology consultation and follow-up if aggressive management is desired.  He verbalized understanding.  Currently no gross hematuria.  It does not appear that urine cytology sample was ever sent.  As per son's discussion with PMT, not interested in any further work-up, urology follow-up or cystoscopy.  1 of 4 blood cultures positive for MRSA coagulase-negative Staphylococcus  Likely contamination.  No further work-up or management.  Essential hypertension  Uncontrolled on clonidine 0.2 mg 3 times daily.  Cardizem discontinued due to significant bradycardia on admission.  As per son, patient tolerated amlodipine in the past without worsening of orthostatic symptoms.  Started amlodipine 2.5 mg daily.  Unable to start ACEI or ARB due to renal insufficiency.  No beta-blockers due to issues with bradycardia.  Discontinued IV metoprolol.  Blood pressures better.  Continue current regimen.  Stage III chronic kidney disease  Creatinine has been widely fluctuating, 1.7-1.8 lately.  Creatinine has plateaued in the 1.4-1.5 range in the last 2 days.  Likely at baseline.  Renal ultrasound 8/10: Compatible with medical renal disease.  No concerns for mass or hydronephrosis.  Anemia of chronic disease, possible  iron deficiency and chronic kidney disease.  Anemia panel: Iron 42, TIBC 281, saturation ratio 15, ferritin 119.  Folate in July was 20.7 and B12 in July was 814.  Baseline hemoglobin fluctuates between mid 7 to mid 8 lately.  Hemoglobin dropped to  7.1 on 9/11 in the absence of overt bleeding.  This may be contributing also to weakness, orthostatic symptoms and near syncopal episodes.  After discussion with patient and son, s/p 1 unit PRBC on 9/11, hemoglobin up to 8.5, follow CBCs periodically.  Parkinson's disease with associated POTS  Continue Sinemet.  As per extensive discussion with son, does not really have complete syncopal episodes.  Has been having episodes frequently in the evenings lately where after sitting for prolonged period of time when he stands up to go to bed, he becomes tremulous, stares blankly in space, less responsive but no LOC and then resolves after laying down after 5 to 7 minutes and he has no recollection of the event or incontinence.  Son has been told that this is due to Parkinson's related orthostatic changes.  Was on Florinef in the past which was discontinued.  Frequent falls/physical deconditioning  Secondary to advanced age, frail physical health, Parkinson's disease and POTS  Son wants patient to go to SNF for short-term rehab with palliative care following followed by LTC.  Dementia  Without agitation.  Continue prior home dose of Seroquel.  Adult failure to thrive  Multifactorial related to advanced age, frail physical health, multiple significant comorbidities.  Son is agreeable for PMT consultation which can be either achieved while hospitalized if can be done in a timely manner or can be pursued at [facility].  Son agreeable.  PMT input 9/12 appreciated >SNF for STR with palliative care ([facility]) following, avoid aggressive medical interventions and focus on quality and not prolonging suffering, will likely decline any further hospitalizations unless needed for symptom management, no further work-up for hematuria i.e. no urology visit or cystoscopy.  History of prostate cancer  Noted.  No further follow-up.   DVT prophylaxis: SCDs Code Status: DNR Family Communication: Cussed in detail  with patient's son via phone, updated care and answered all questions. Disposition: DC to SNF pending SNF bed availability and insurance clearance.  Patient unsafe to discharge home.   Consultants:  PMT  Procedures:  None  Antimicrobials:  IV ceftriaxone 9/9-9/10    Subjective: Patient denies complaints.  "I am well".  As per RN, no acute issues noted.  Objective:  Vitals:   05/23/19 0132 05/23/19 0629 05/23/19 0700 05/23/19 0830  BP: (!) 144/129 (!) 178/85 (!) 160/72 (!) 157/77  Pulse: 85 73 74 74  Resp: 16 18 18    Temp: 98.1 F (36.7 C) 98.6 F (37 C)    TempSrc: Oral Oral    SpO2: 96% 95% 95% 93%  Weight:      Height:        Examination:  General exam: Pleasant elderly male, moderately built and frail lying comfortably propped up in bed without distress.  Oral mucosa moist. Respiratory system: Clear to auscultation.  No increased work of breathing. Cardiovascular system: S1 and S2 heard, RRR.  No JVD or murmurs.  Telemetry personally reviewed: Sinus rhythm with BBB morphology. Gastrointestinal system: Abdomen is nondistended, soft and nontender. No organomegaly or masses felt. Normal bowel sounds heard. Central nervous system: Alert and oriented x3. No focal neurological deficits. Extremities: Symmetric 5 x 5 power.  Bilateral upper extremities with couple of superficial skin tears with dressing clean  and dry.   Skin: No rashes, lesions or ulcers Psychiatry: Judgement and insight appear impaired.  Mood & affect flat.     Data Reviewed: I have personally reviewed following labs and imaging studies  CBC: Recent Labs  Lab 05/20/19 1140 05/21/19 0600 05/22/19 0110 05/23/19 0347  WBC 7.9 5.9 5.5 5.2  NEUTROABS 6.3  --   --   --   HGB 7.5* 8.5* 7.1* 8.5*  HCT 24.1* 26.4* 22.4* 26.6*  MCV 99.6 95.0 94.9 95.3  PLT 170 188 179 [id number]   Basic Metabolic Panel: Recent Labs  Lab 05/20/19 1140 05/20/19 1900 05/22/19 0110 05/23/19 0347  NA 138  --  137 137  K  4.1  --  3.7 4.0  CL 108  --  104 104  CO2 23  --  25 25  GLUCOSE 98  --  102* 95  BUN 43*  --  34* 28*  CREATININE 1.66*  --  1.47* 1.51*  CALCIUM 8.5*  --  8.6* 8.8*  MG  --  1.8  --   --    Liver Function Tests: Recent Labs  Lab 05/20/19 1140  AST 10*  ALT 5  ALKPHOS 56  BILITOT 0.3  PROT 5.6*  ALBUMIN 2.9*    Cardiac Enzymes: No results for input(s): CKTOTAL, CKMB, CKMBINDEX, TROPONINI in the last 168 hours.  CBG: No results for input(s): GLUCAP in the last 168 hours.  Recent Results (from the past 240 hour(s))  Culture, blood (Routine x 2)     Status: None (Preliminary result)   Collection Time: 05/20/19 11:43 AM   Specimen: BLOOD  Result Value Ref Range Status   Specimen Description BLOOD RIGHT ANTECUBITAL  Final   Special Requests   Final    BOTTLES DRAWN AEROBIC AND ANAEROBIC Blood Culture results may not be optimal due to an inadequate volume of blood received in culture bottles   Culture   Final    NO GROWTH 3 DAYS Performed at [facility], [place] [address], [place], [place] [address]    Report Status PENDING  Incomplete  Urine culture     Status: Abnormal (Preliminary result)   Collection Time: 05/20/19  6:03 PM   Specimen: Urine, Random  Result Value Ref Range Status   Specimen Description URINE, RANDOM  Final   Special Requests   Final    Normal Performed at [facility], [place] [address], [place], [place] [address]    Culture >=100,000 COLONIES/mL STAPHYLOCOCCUS SIMULANS (A)  Final   Report Status PENDING  Incomplete  SARS Coronavirus 2 [facility] order, Performed in [facility] hospital lab) Nasopharyngeal Nasopharyngeal Swab     Status: None   Collection Time: 05/20/19  8:57 PM   Specimen: Nasopharyngeal Swab  Result Value Ref Range Status   SARS Coronavirus 2 NEGATIVE NEGATIVE Final    Comment: (NOTE) If result is NEGATIVE SARS-CoV-2 target nucleic acids are NOT DETECTED. The SARS-CoV-2 RNA is generally detectable in upper and lower   respiratory specimens during the acute phase of infection. The lowest  concentration of SARS-CoV-2 viral copies this assay can detect is 250  copies / mL. A negative result does not preclude SARS-CoV-2 infection  and should not be used as the sole basis for treatment or other  patient management decisions.  A negative result may occur with  improper specimen collection / handling, submission of specimen other  than nasopharyngeal swab, presence of viral mutation(s) within the  areas targeted by this  assay, and inadequate number of viral copies  (<250 copies / mL). A negative result must be combined with clinical  observations, patient history, and epidemiological information. If result is POSITIVE SARS-CoV-2 target nucleic acids are DETECTED. The SARS-CoV-2 RNA is generally detectable in upper and lower  respiratory specimens dur ing the acute phase of infection.  Positive  results are indicative of active infection with SARS-CoV-2.  Clinical  correlation with patient history and other diagnostic information is  necessary to determine patient infection status.  Positive results do  not rule out bacterial infection or co-infection with other viruses. If result is PRESUMPTIVE POSTIVE SARS-CoV-2 nucleic acids MAY BE PRESENT.   A presumptive positive result was obtained on the submitted specimen  and confirmed on repeat testing.  While 2019 novel coronavirus  (SARS-CoV-2) nucleic acids may be present in the submitted sample  additional confirmatory testing may be necessary for epidemiological  and / or clinical management purposes  to differentiate between  SARS-CoV-2 and other Sarbecovirus currently known to infect humans.  If clinically indicated additional testing with an alternate test  methodology [phone]) is advised. The SARS-CoV-2 RNA is generally  detectable in upper and lower respiratory sp ecimens during the acute  phase of infection. The expected result is Negative. Fact  Sheet for Patients:  StrictlyIdeas.no Fact Sheet for Healthcare Providers: BankingDealers.co.za This test is not yet approved or cleared by the [place] FDA and has been authorized for detection and/or diagnosis of SARS-CoV-2 by FDA under an Emergency Use Authorization (EUA).  This EUA will remain in effect (meaning this test can be used) for the duration of the COVID-19 declaration under Section 564(b)(1) of the Act, 21 U.S.C. section 360bbb-3(b)(1), unless the authorization is terminated or revoked sooner. Performed at [facility], [place] [address], [place], [place] [address]   Culture, blood (Routine x 2)     Status: Abnormal   Collection Time: 05/20/19  8:58 PM   Specimen: BLOOD  Result Value Ref Range Status   Specimen Description BLOOD LEFT ARM  Final   Special Requests   Final    BOTTLES DRAWN AEROBIC AND ANAEROBIC Blood Culture adequate volume   Culture  Setup Time   Final    GRAM POSITIVE COCCI IN CLUSTERS IN BOTH AEROBIC AND ANAEROBIC BOTTLES CRITICAL RESULT CALLED TO, READ BACK BY AND VERIFIED WITH: E. MARTIN,PHARMD 2156 05/21/2019 T. TYSOR    Culture (A)  Final    STAPHYLOCOCCUS SPECIES (COAGULASE NEGATIVE) THE SIGNIFICANCE OF ISOLATING THIS ORGANISM FROM A SINGLE SET OF BLOOD CULTURES WHEN MULTIPLE SETS ARE DRAWN IS UNCERTAIN. PLEASE NOTIFY THE MICROBIOLOGY DEPARTMENT WITHIN ONE WEEK IF SPECIATION AND SENSITIVITIES ARE REQUIRED. Performed at [facility], [place] [address] [place],  [address]    Report Status 05/23/2019 FINAL  Final  Blood Culture ID Panel (Reflexed)     Status: Abnormal   Collection Time: 05/20/19  8:58 PM  Result Value Ref Range Status   Enterococcus species NOT DETECTED NOT DETECTED Final   Listeria monocytogenes NOT DETECTED NOT DETECTED Final   Staphylococcus species DETECTED (A) NOT DETECTED Final    Comment: Methicillin (oxacillin) resistant coagulase negative staphylococcus.  Possible blood culture contaminant (unless isolated from more than one blood culture draw or clinical case suggests pathogenicity). No antibiotic treatment is indicated for blood  culture contaminants. CRITICAL RESULT CALLED TO, READ BACK BY AND VERIFIED WITH: E. MARTIN,PHARMD 2156 05/21/2019 T. TYSOR    Staphylococcus aureus (BCID) NOT DETECTED NOT DETECTED Final  Methicillin resistance DETECTED (A) NOT DETECTED Final    Comment: CRITICAL RESULT CALLED TO, READ BACK BY AND VERIFIED WITH: E. MARTIN,PHARMD 2156 05/21/2019 T. TYSOR    Streptococcus species NOT DETECTED NOT DETECTED Final   Streptococcus agalactiae NOT DETECTED NOT DETECTED Final   Streptococcus pneumoniae NOT DETECTED NOT DETECTED Final   Streptococcus pyogenes NOT DETECTED NOT DETECTED Final   Acinetobacter baumannii NOT DETECTED NOT DETECTED Final   Enterobacteriaceae species NOT DETECTED NOT DETECTED Final   Enterobacter cloacae complex NOT DETECTED NOT DETECTED Final   Escherichia coli NOT DETECTED NOT DETECTED Final   Klebsiella oxytoca NOT DETECTED NOT DETECTED Final   Klebsiella pneumoniae NOT DETECTED NOT DETECTED Final   Proteus species NOT DETECTED NOT DETECTED Final   Serratia marcescens NOT DETECTED NOT DETECTED Final   Haemophilus influenzae NOT DETECTED NOT DETECTED Final   Neisseria meningitidis NOT DETECTED NOT DETECTED Final   Pseudomonas aeruginosa NOT DETECTED NOT DETECTED Final   Candida albicans NOT DETECTED NOT DETECTED Final   Candida glabrata NOT DETECTED NOT DETECTED Final   Candida krusei NOT DETECTED NOT DETECTED Final   Candida parapsilosis NOT DETECTED NOT DETECTED Final   Candida tropicalis NOT DETECTED NOT DETECTED Final    Comment: Performed at [facility], [place] [address] [place], [place] [address]         Radiology Studies: No results found.      Scheduled Meds: . sodium chloride   Intravenous Once  . amLODipine  2.5 mg Oral Daily  . carbidopa-levodopa  1 tablet  Oral 3 times per day  . cloNIDine  0.2 mg Oral 3 times per day  . feeding supplement (ENSURE ENLIVE)  237 mL Oral BID BM  . multivitamin with minerals  1 tablet Oral Daily  . QUEtiapine  50 mg Oral q1800  . rosuvastatin  40 mg Oral q1800  . thiamine  100 mg Oral Daily   Continuous Infusions: . sodium chloride 250 mL (05/21/19 2133)     LOS: 2 days     Vernell Leep, MD, FACP, [facility]. [facility]  To contact the attending provider between 7A-7P or the covering provider during after hours 7P-7A, please log into the web site [email or web address] and access using universal [place] password for that web site. If you do not have the password, please call the hospital operator.  05/23/2019, 10:43 AM

## 2019-05-23 NOTE — TOC Progression Note (Addendum)
Transition of Care [facility]) - Progression Note    Patient Details  Name: SADAT YARTER MRN: [id number] Date of Birth: 1928-10-22  Transition of Care [facility]) CM/SW [place], LCSW Phone Number:[phone] 05/23/2019, 11:40 AM  Clinical Narrative:    CSW spoke with Rober Minion to let them know of impending discharge. They stated that they would need to get an authorization from [facility] and it is unlikely that it will come back over the weekend. Most likely planning on a Monday discharge. Facility also request a COVID test within 48 hours of discharge. CSW alerted RN of need for another COVID test.  CSW will continue to monitor for discharge planning.   12:52- CSW addressed consult for palliative care to follow once at SNF. CSW spoke with Caryl Pina at [facility] and notified her of request. CSW confirmed the request for [facility] as well with facility.  Expected Discharge Plan: Skilled Nursing Facility Barriers to Discharge: No Barriers Identified  Expected Discharge Plan and Services Expected Discharge Plan: [place]   Discharge Planning Services: CM Consult Post Acute Care Choice: [place] Living arrangements for the past 2 months: Mobile Home                 DME Arranged: (NA)         HH Arranged: NA           Social Determinants of Health (SDOH) Interventions    Readmission Risk Interventions Readmission Risk Prevention Plan 05/23/2019  Transportation Screening Complete  PCP or Specialist Appt within 3-5 Days Complete  HRI or [place] Complete  Social Work Consult for [place] Planning/Counseling Complete  Palliative Care Screening Not Applicable  Medication Review Press photographer) Complete  Some recent data might be hidden

## 2019-05-23 NOTE — TOC Initial Note (Signed)
Transition of Care [facility]) - Initial/Assessment Note    Patient Details  Name: Jose Baldwin MRN: [id number] Date of Birth: 1929/07/12  Transition of Care [facility]) CM/SW Contact:    Zenon Mayo, RN Phone Number: 05/23/2019, 8:40 AM  Clinical Narrative:                 NCM spoke with patient and son, Jose Baldwin, he would like [facility] and he states Ashely at [facility] is expecting a call.  Patient is agreeable to go to SNF.  He lives alone in a mobile home.    Expected Discharge Plan: Skilled Nursing Facility Barriers to Discharge: No Barriers Identified   Patient Goals and CMS Choice Patient states their goals for this hospitalization and ongoing recovery are:: get better CMS Medicare.gov Compare Post Acute Care list provided to:: Patient Represenative (must comment)(Jose Baldwin,son) Choice offered to / list presented to : Adult Children  Expected Discharge Plan and Services Expected Discharge Plan: [place]   Discharge Planning Services: CM Consult Post Acute Care Choice: [place] Living arrangements for the past 2 months: Mobile Home                 DME Arranged: (NA)         HH Arranged: NA          Prior Living Arrangements/Services Living arrangements for the past 2 months: Mobile Home Lives with:: Self Patient language and need for interpreter reviewed:: Yes Do you feel safe going back to the place where you live?: Yes      Need for Family Participation in Patient Care: Yes (Comment) Care giver support system in place?: Yes (comment)   Criminal Activity/Legal Involvement Pertinent to Current Situation/Hospitalization: No - Comment as needed  Activities of Daily Living Home Assistive Devices/Equipment: Walker (specify type)(front wheel) ADL Screening (condition at time of admission) Patient's cognitive ability adequate to safely complete daily activities?: Yes Is the patient deaf or have difficulty hearing?: No Does the  patient have difficulty seeing, even when wearing glasses/contacts?: No Does the patient have difficulty concentrating, remembering, or making decisions?: No Patient able to express need for assistance with ADLs?: Yes Does the patient have difficulty dressing or bathing?: No Independently performs ADLs?: Yes (appropriate for developmental age) Does the patient have difficulty walking or climbing stairs?: Yes Weakness of Legs: None Weakness of Arms/Hands: None  Permission Sought/Granted Permission sought to share information with : Facility Sport and exercise psychologist, Tourist information centre manager, Family Supports Permission granted to share information with : Yes, Verbal Permission Granted  Share Information with NAME: Jose Baldwin  Permission granted to share info w AGENCY: SNF's  Permission granted to share info w Relationship: Jose Baldwin, son  Permission granted to share info w Contact Information: [id number]  Emotional Assessment Appearance:: Appears stated age Attitude/Demeanor/Rapport: Gracious Affect (typically observed): Appropriate Orientation: : Oriented to Self, Oriented to Place, Oriented to  Time, Oriented to Situation Alcohol / Substance Use: Not Applicable Psych Involvement: No (comment)  Admission diagnosis:  Bradycardia [R00.1] Parkinson disease ([place]) [G20] Anemia, unspecified type [D64.9] Hematuria of unknown cause [R31.9] Patient Active Problem List   Diagnosis Date Noted  . Bradycardia 05/22/2019  . Goals of care, counseling/discussion   . Palliative care by specialist   . Uncontrolled hypertension 05/21/2019  . Normocytic anemia 05/21/2019  . CKD (chronic kidney disease), stage III ([place]) 05/21/2019  . Physical deconditioning 05/21/2019  . Hematuria 05/21/2019  . UTI (urinary tract infection) 05/20/2019  . Hypokalemia  04/20/2019  . Hypomagnesemia 04/20/2019  . Parkinson disease ([place]) 04/20/2019  . Cardiac arrhythmia 04/20/2019  . Acute renal failure superimposed on stage 3  chronic kidney disease ([place]) 04/20/2019  . Protein-calorie malnutrition, severe 03/31/2019  . Cerebral embolism with cerebral infarction 03/30/2019  . POTS (postural orthostatic tachycardia syndrome) 03/28/2019  . HTN (hypertension) 02/04/2018  . Prostate cancer ([place]) 02/04/2018  . Orthostatic hypotension 02/04/2018  . Iron deficiency anemia 02/04/2018  . Syncope 02/02/2018   PCP:  Billie Ruddy, MD Pharmacy:   [facility] [place] [place] [facility] [place] [id number] [place] [address] Phone: [phone] Fax: [place], [place] - [place] [phone] [place] [address] Phone: [phone] Fax: [phone]     Social Determinants of Health (SDOH) Interventions    Readmission Risk Interventions Readmission Risk Prevention Plan 05/23/2019  Transportation Screening Complete  PCP or Specialist Appt within 3-5 Days Complete  HRI or Home Care Consult Complete  Social Work Consult for [place] Planning/Counseling Complete  Palliative Care Screening Not Applicable  Medication Review Press photographer) Complete  Some recent data might be hidden

## 2019-05-24 LAB — CBC
HCT: 27.1 % — ABNORMAL LOW (ref 39.0–52.0)
Hemoglobin: 8.8 g/dL — ABNORMAL LOW (ref 13.0–17.0)
MCH: 31 pg (ref 26.0–34.0)
MCHC: 32.5 g/dL (ref 30.0–36.0)
MCV: 95.4 fL (ref 80.0–100.0)
Platelets: 170 10*3/uL (ref 150–400)
RBC: 2.84 MIL/uL — ABNORMAL LOW (ref 4.22–5.81)
RDW: 14.6 % (ref 11.5–15.5)
WBC: 5.9 10*3/uL (ref 4.0–10.5)
nRBC: 0 % (ref 0.0–0.2)

## 2019-05-24 LAB — BASIC METABOLIC PANEL
Anion gap: 8 (ref 5–15)
BUN: 30 mg/dL — ABNORMAL HIGH (ref 8–23)
CO2: 25 mmol/L (ref 22–32)
Calcium: 8.6 mg/dL — ABNORMAL LOW (ref 8.9–10.3)
Chloride: 105 mmol/L (ref 98–111)
Creatinine, Ser: 1.51 mg/dL — ABNORMAL HIGH (ref 0.61–1.24)
GFR calc Af Amer: 47 mL/min — ABNORMAL LOW (ref >60)
GFR calc non Af Amer: 41 mL/min — ABNORMAL LOW (ref >60)
Glucose, Bld: 100 mg/dL — ABNORMAL HIGH (ref 70–99)
Potassium: 4 mmol/L (ref 3.5–5.1)
Sodium: 138 mmol/L (ref 135–145)

## 2019-05-24 NOTE — Plan of Care (Signed)

## 2019-05-24 NOTE — Progress Notes (Addendum)
PROGRESS NOTE   Jose Baldwin  [id number]    DOB: [birth year]    DOA: 05/20/2019  PCP: Billie Ruddy, MD   I have briefly reviewed patients previous medical records in [facility].  Chief Complaint  Patient presents with  . Hematuria  Bradycardia  Brief Narrative:  85 year old male, lives alone but his son has been staying with him for the last several months since the COVID-19 pandemic, ambulates with the help of a walker, PMH of HTN, prostate cancer s/p radiation seeds, Parkinson's disease, POTS, frequent falls, prior CVA, presented to the ED on 9/9 after his home health RN noticed bradycardia of 32/min which was verified by repeated testing, patient was advised to come to the ED same day but he refused and was brought in by his son the next day.  Also reported intermittent hematuria of 6 weeks duration.  Bradycardia suspected due to Cardizem and clonidine, Cardizem discontinued and bradycardia has resolved.  No gross hematuria or clinical UTI, discontinued ceftriaxone.  Adult failure to thrive, PMT consulted and SNF pending.  S/p 1 unit PRBC on 9/11 for Hb 7.1.  Medically stable for DC to SNF pending bed/insurance clearance and negative repeat COVID-19 testing.   Assessment & Plan:   Principal Problem:   Bradycardia Active Problems:   Parkinson disease ([place])   UTI (urinary tract infection)   Uncontrolled hypertension   Normocytic anemia   CKD (chronic kidney disease), stage III (HCC)   Physical deconditioning   Hematuria   Goals of care, counseling/discussion   Palliative care by specialist   Bradycardia  Heart rate reportedly went down to as low as 32 bpm on day prior to admission as verified by home health RN.  Likely secondary to Cardizem and clonidine.  Cardizem discontinued.  Avoid adding new chronotropic medications.  TSH and free T4 normal.  Resolved.  Intermittent hematuria  Reportedly ongoing for 6 weeks in the absence of dysuria, pain, fever or  chills.  Completed a 5 days course of oral ciprofloxacin for presumed UTI recently.  History of prostate cancer treated with radiation seeds several years ago by a physician in [place]  No clinical UTI.  Urine culture shows Staphylococcus simulans, likely colonizer.  Completed 2 days of IV ceftriaxone, discontinued after 9/10 dose.  Discussed with patient's son in detail and recommended outpatient urology consultation and follow-up if aggressive management is desired.  He verbalized understanding.  Currently no gross hematuria.  Urine and condom catheter with bag without gross hematuria.  As per son's discussion with PMT, not interested in any further work-up, urology follow-up or cystoscopy.  1 of 4 blood cultures positive for MRSA coagulase-negative Staphylococcus  Likely contamination.  No further work-up or management.  Essential hypertension  Uncontrolled on clonidine 0.2 mg 3 times daily.  Cardizem discontinued due to significant bradycardia on admission.  As per son, patient tolerated amlodipine in the past without worsening of orthostatic symptoms.  Started amlodipine 2.5 mg daily.  Unable to start ACEI or ARB due to renal insufficiency.  No beta-blockers due to issues with bradycardia.  Discontinued IV metoprolol.  Reasonable control.  Continue current regimen without changes.  Stage III chronic kidney disease  Creatinine has been widely fluctuating, 1.7-1.8 lately.  Creatinine has plateaued in the 1.4 >1 0.5 > 1.5.  Stable.  Renal ultrasound 8/10: Compatible with medical renal disease.  No concerns for mass or hydronephrosis.  Anemia of chronic disease, possible iron deficiency and chronic kidney disease.  Anemia panel: Iron  42, TIBC 281, saturation ratio 15, ferritin 119.  Folate in July was 20.7 and B12 in July was 814.  Baseline hemoglobin fluctuates between mid 7 to mid 8 lately.  Hemoglobin dropped to 7.1 on 9/11 in the absence of overt bleeding.  This may  be contributing also to weakness, orthostatic symptoms and near syncopal episodes.  After discussion with patient and son, s/p 1 unit PRBC on 9/11, hemoglobin up to 8.5, follow CBCs periodically.  Hemoglobin stable in the mid 8 range posttransfusion.  Parkinson's disease with associated POTS  Continue Sinemet.  As per extensive discussion with son, does not really have complete syncopal episodes.  Has been having episodes frequently in the evenings lately where after sitting for prolonged period of time when he stands up to go to bed, he becomes tremulous, stares blankly in space, less responsive but no LOC and then resolves after laying down after 5 to 7 minutes and he has no recollection of the event or incontinence.  Son has been told that this is due to Parkinson's related orthostatic changes.  Was on Florinef in the past which was discontinued.  Frequent falls/physical deconditioning  Secondary to advanced age, frail physical health, Parkinson's disease and POTS  Son wants patient to go to SNF for short-term rehab with palliative care following followed by LTC.  Dementia  Without agitation.  Continue prior home dose of Seroquel.  Adult failure to thrive  Multifactorial related to advanced age, frail physical health, multiple significant comorbidities.  Son is agreeable for PMT consultation which can be either achieved while hospitalized if can be done in a timely manner or can be pursued at [facility].  Son agreeable.  PMT input 9/12 appreciated >SNF for STR with palliative care ([facility]) following, avoid aggressive medical interventions and focus on quality and not prolonging suffering, will likely decline any further hospitalizations unless needed for symptom management, no further work-up for hematuria i.e. no urology visit or cystoscopy.  History of prostate cancer  Noted.  No further follow-up.   Dysphagia  Continue dysphagia 3 diet and thin liquids with strict aspiration  precautions.   DVT prophylaxis: SCDs Code Status: DNR Family Communication: Cussed in detail with patient's son via phone, updated care and answered all questions. Disposition: DC to SNF pending SNF bed availability, insurance clearance and negative repeat COVID testing required by SNF.  Consultants:  PMT  Procedures:  None  Antimicrobials:  IV ceftriaxone 9/9-9/10    Subjective: "When I am I being discharged".  Denies any complaints.  As per RN, no acute issues noted.  Feeds self.  Objective:  Vitals:   05/24/19 0555 05/24/19 0626 05/24/19 0906 05/24/19 0907  BP: (!) 161/72  (!) 154/77 (!) 154/77  Pulse: 74   75  Resp:    14  Temp:    98.8 F (37.1 C)  TempSrc:    Oral  SpO2:    94%  Weight:  58.2 kg    Height:        Examination:  General exam: Pleasant elderly male, moderately built and frail sitting up in bed eating breakfast by himself.  Does not appear in any distress. Respiratory system: Clear to auscultation.  No increased work of breathing. Cardiovascular system: S1 and S2 heard, RRR.  No JVD or murmurs. Gastrointestinal system: Abdomen is nondistended, soft and nontender. No organomegaly or masses felt. Normal bowel sounds heard. Central nervous system: Alert and oriented x3. No focal neurological deficits. Extremities: Symmetric 5 x 5 power.  Bilateral  upper extremities with couple of superficial skin tears with dressing clean and dry.   Skin: No rashes, lesions or ulcers Psychiatry: Judgement and insight appear impaired.  Mood & affect flat.     Data Reviewed: I have personally reviewed following labs and imaging studies  CBC: Recent Labs  Lab 05/20/19 1140 05/21/19 0600 05/22/19 0110 05/23/19 0347 05/24/19 0513  WBC 7.9 5.9 5.5 5.2 5.9  NEUTROABS 6.3  --   --   --   --   HGB 7.5* 8.5* 7.1* 8.5* 8.8*  HCT 24.1* 26.4* 22.4* 26.6* 27.1*  MCV 99.6 95.0 94.9 95.3 95.4  PLT 170 188 179 170 [id number]   Basic Metabolic Panel: Recent Labs  Lab  05/20/19 1140 05/20/19 1900 05/22/19 0110 05/23/19 0347 05/24/19 0513  NA 138  --  137 137 138  K 4.1  --  3.7 4.0 4.0  CL 108  --  104 104 105  CO2 23  --  25 25 25   GLUCOSE 98  --  102* 95 100*  BUN 43*  --  34* 28* 30*  CREATININE 1.66*  --  1.47* 1.51* 1.51*  CALCIUM 8.5*  --  8.6* 8.8* 8.6*  MG  --  1.8  --   --   --    Liver Function Tests: Recent Labs  Lab 05/20/19 1140  AST 10*  ALT 5  ALKPHOS 56  BILITOT 0.3  PROT 5.6*  ALBUMIN 2.9*    Cardiac Enzymes: No results for input(s): CKTOTAL, CKMB, CKMBINDEX, TROPONINI in the last 168 hours.  CBG: No results for input(s): GLUCAP in the last 168 hours.  Recent Results (from the past 240 hour(s))  Culture, blood (Routine x 2)     Status: None (Preliminary result)   Collection Time: 05/20/19 11:43 AM   Specimen: BLOOD  Result Value Ref Range Status   Specimen Description BLOOD RIGHT ANTECUBITAL  Final   Special Requests   Final    BOTTLES DRAWN AEROBIC AND ANAEROBIC Blood Culture results may not be optimal due to an inadequate volume of blood received in culture bottles   Culture   Final    NO GROWTH 4 DAYS Performed at [facility] [address], [place], [place] [address]    Report Status PENDING  Incomplete  Urine culture     Status: Abnormal   Collection Time: 05/20/19  6:03 PM   Specimen: Urine, Random  Result Value Ref Range Status   Specimen Description URINE, RANDOM  Final   Special Requests   Final    Normal Performed at [facility], [place] [address] [place], [place] [address]    Culture >=100,000 COLONIES/mL STAPHYLOCOCCUS SIMULANS (A)  Final   Report Status 05/23/2019 FINAL  Final   Organism ID, Bacteria STAPHYLOCOCCUS SIMULANS (A)  Final      Susceptibility   Staphylococcus simulans - MIC*    CIPROFLOXACIN >=8 RESISTANT Resistant     GENTAMICIN <=0.5 SENSITIVE Sensitive     NITROFURANTOIN <=16 SENSITIVE Sensitive     OXACILLIN >=4 RESISTANT Resistant     TETRACYCLINE <=1  SENSITIVE Sensitive     VANCOMYCIN <=0.5 SENSITIVE Sensitive     TRIMETH/SULFA <=10 SENSITIVE Sensitive     CLINDAMYCIN RESISTANT Resistant     RIFAMPIN <=0.5 SENSITIVE Sensitive     Inducible Clindamycin POSITIVE Resistant     * >=100,000 COLONIES/mL STAPHYLOCOCCUS SIMULANS  SARS Coronavirus 2 [facility] order, Performed in [facility] hospital lab) Nasopharyngeal Nasopharyngeal Swab  Status: None   Collection Time: 05/20/19  8:57 PM   Specimen: Nasopharyngeal Swab  Result Value Ref Range Status   SARS Coronavirus 2 NEGATIVE NEGATIVE Final    Comment: (NOTE) If result is NEGATIVE SARS-CoV-2 target nucleic acids are NOT DETECTED. The SARS-CoV-2 RNA is generally detectable in upper and lower  respiratory specimens during the acute phase of infection. The lowest  concentration of SARS-CoV-2 viral copies this assay can detect is 250  copies / mL. A negative result does not preclude SARS-CoV-2 infection  and should not be used as the sole basis for treatment or other  patient management decisions.  A negative result may occur with  improper specimen collection / handling, submission of specimen other  than nasopharyngeal swab, presence of viral mutation(s) within the  areas targeted by this assay, and inadequate number of viral copies  (<250 copies / mL). A negative result must be combined with clinical  observations, patient history, and epidemiological information. If result is POSITIVE SARS-CoV-2 target nucleic acids are DETECTED. The SARS-CoV-2 RNA is generally detectable in upper and lower  respiratory specimens dur ing the acute phase of infection.  Positive  results are indicative of active infection with SARS-CoV-2.  Clinical  correlation with patient history and other diagnostic information is  necessary to determine patient infection status.  Positive results do  not rule out bacterial infection or co-infection with other viruses. If result is PRESUMPTIVE  POSTIVE SARS-CoV-2 nucleic acids MAY BE PRESENT.   A presumptive positive result was obtained on the submitted specimen  and confirmed on repeat testing.  While 2019 novel coronavirus  (SARS-CoV-2) nucleic acids may be present in the submitted sample  additional confirmatory testing may be necessary for epidemiological  and / or clinical management purposes  to differentiate between  SARS-CoV-2 and other Sarbecovirus currently known to infect humans.  If clinically indicated additional testing with an alternate test  methodology [phone]) is advised. The SARS-CoV-2 RNA is generally  detectable in upper and lower respiratory sp ecimens during the acute  phase of infection. The expected result is Negative. Fact Sheet for Patients:  StrictlyIdeas.no Fact Sheet for Healthcare Providers: BankingDealers.co.za This test is not yet approved or cleared by the [place] FDA and has been authorized for detection and/or diagnosis of SARS-CoV-2 by FDA under an Emergency Use Authorization (EUA).  This EUA will remain in effect (meaning this test can be used) for the duration of the COVID-19 declaration under Section 564(b)(1) of the Act, 21 U.S.C. section 360bbb-3(b)(1), unless the authorization is terminated or revoked sooner. Performed at [facility], [place] [address], [place], [place] [address]   Culture, blood (Routine x 2)     Status: Abnormal   Collection Time: 05/20/19  8:58 PM   Specimen: BLOOD  Result Value Ref Range Status   Specimen Description BLOOD LEFT ARM  Final   Special Requests   Final    BOTTLES DRAWN AEROBIC AND ANAEROBIC Blood Culture adequate volume   Culture  Setup Time   Final    GRAM POSITIVE COCCI IN CLUSTERS IN BOTH AEROBIC AND ANAEROBIC BOTTLES CRITICAL RESULT CALLED TO, READ BACK BY AND VERIFIED WITH: E. MARTIN,PHARMD 2156 05/21/2019 T. TYSOR    Culture (A)  Final    STAPHYLOCOCCUS SPECIES (COAGULASE  NEGATIVE) THE SIGNIFICANCE OF ISOLATING THIS ORGANISM FROM A SINGLE SET OF BLOOD CULTURES WHEN MULTIPLE SETS ARE DRAWN IS UNCERTAIN. PLEASE NOTIFY THE MICROBIOLOGY DEPARTMENT WITHIN ONE WEEK IF SPECIATION AND SENSITIVITIES ARE REQUIRED.  Performed at [facility], [place] [address], [place], [place] [address]    Report Status 05/23/2019 FINAL  Final  Blood Culture ID Panel (Reflexed)     Status: Abnormal   Collection Time: 05/20/19  8:58 PM  Result Value Ref Range Status   Enterococcus species NOT DETECTED NOT DETECTED Final   Listeria monocytogenes NOT DETECTED NOT DETECTED Final   Staphylococcus species DETECTED (A) NOT DETECTED Final    Comment: Methicillin (oxacillin) resistant coagulase negative staphylococcus. Possible blood culture contaminant (unless isolated from more than one blood culture draw or clinical case suggests pathogenicity). No antibiotic treatment is indicated for blood  culture contaminants. CRITICAL RESULT CALLED TO, READ BACK BY AND VERIFIED WITH: E. MARTIN,PHARMD 2156 05/21/2019 T. TYSOR    Staphylococcus aureus (BCID) NOT DETECTED NOT DETECTED Final   Methicillin resistance DETECTED (A) NOT DETECTED Final    Comment: CRITICAL RESULT CALLED TO, READ BACK BY AND VERIFIED WITH: E. MARTIN,PHARMD 2156 05/21/2019 T. TYSOR    Streptococcus species NOT DETECTED NOT DETECTED Final   Streptococcus agalactiae NOT DETECTED NOT DETECTED Final   Streptococcus pneumoniae NOT DETECTED NOT DETECTED Final   Streptococcus pyogenes NOT DETECTED NOT DETECTED Final   Acinetobacter baumannii NOT DETECTED NOT DETECTED Final   Enterobacteriaceae species NOT DETECTED NOT DETECTED Final   Enterobacter cloacae complex NOT DETECTED NOT DETECTED Final   Escherichia coli NOT DETECTED NOT DETECTED Final   Klebsiella oxytoca NOT DETECTED NOT DETECTED Final   Klebsiella pneumoniae NOT DETECTED NOT DETECTED Final   Proteus species NOT DETECTED NOT DETECTED Final   Serratia marcescens NOT  DETECTED NOT DETECTED Final   Haemophilus influenzae NOT DETECTED NOT DETECTED Final   Neisseria meningitidis NOT DETECTED NOT DETECTED Final   Pseudomonas aeruginosa NOT DETECTED NOT DETECTED Final   Candida albicans NOT DETECTED NOT DETECTED Final   Candida glabrata NOT DETECTED NOT DETECTED Final   Candida krusei NOT DETECTED NOT DETECTED Final   Candida parapsilosis NOT DETECTED NOT DETECTED Final   Candida tropicalis NOT DETECTED NOT DETECTED Final    Comment: Performed at [facility], [place]. [address], [place], [place] [address]         Radiology Studies: No results found.      Scheduled Meds: . amLODipine  2.5 mg Oral Daily  . carbidopa-levodopa  1 tablet Oral 3 times per day  . cloNIDine  0.2 mg Oral 3 times per day  . feeding supplement (ENSURE ENLIVE)  237 mL Oral BID BM  . multivitamin with minerals  1 tablet Oral Daily  . QUEtiapine  50 mg Oral q1800  . rosuvastatin  40 mg Oral q1800  . thiamine  100 mg Oral Daily   Continuous Infusions: . sodium chloride 250 mL (05/21/19 2133)     LOS: 3 days     Vernell Leep, MD, FACP, [facility]. [facility]  To contact the attending provider between 7A-7P or the covering provider during after hours 7P-7A, please log into the web site [email or web address] and access using universal [place] password for that web site. If you do not have the password, please call the hospital operator.  05/24/2019, 9:40 AM

## 2019-05-24 NOTE — Plan of Care (Signed)
  Problem: Education: Goal: Knowledge of General Education information will improve Description: Including pain rating scale, medication(s)/side effects and non-pharmacologic comfort measures 05/24/2019 0355 by Jacolyn Reedy, RN Outcome: Progressing 05/24/2019 0354 by Jacolyn Reedy, RN Outcome: Progressing   Problem: Health Behavior/Discharge Planning: Goal: Ability to manage health-related needs will improve 05/24/2019 0355 by Jacolyn Reedy, RN Outcome: Progressing 05/24/2019 0354 by Jacolyn Reedy, RN Outcome: Progressing   Problem: Clinical Measurements: Goal: Ability to maintain clinical measurements within normal limits will improve 05/24/2019 0355 by Jacolyn Reedy, RN Outcome: Progressing 05/24/2019 0354 by Jacolyn Reedy, RN Outcome: Progressing Goal: Will remain free from infection 05/24/2019 0355 by Jacolyn Reedy, RN Outcome: Progressing 05/24/2019 0354 by Jacolyn Reedy, RN Outcome: Progressing Goal: Diagnostic test results will improve 05/24/2019 0355 by Jacolyn Reedy, RN Outcome: Progressing 05/24/2019 0354 by Jacolyn Reedy, RN Outcome: Progressing Goal: Respiratory complications will improve 05/24/2019 0355 by Jacolyn Reedy, RN Outcome: Progressing 05/24/2019 0354 by Jacolyn Reedy, RN Outcome: Progressing Goal: Cardiovascular complication will be avoided 05/24/2019 0355 by Jacolyn Reedy, RN Outcome: Progressing 05/24/2019 0354 by Jacolyn Reedy, RN Outcome: Progressing   Problem: Activity: Goal: Risk for activity intolerance will decrease 05/24/2019 0355 by Jacolyn Reedy, RN Outcome: Progressing 05/24/2019 0354 by Jacolyn Reedy, RN Outcome: Progressing   Problem: Nutrition: Goal: Adequate nutrition will be maintained 05/24/2019 0355 by Jacolyn Reedy, RN Outcome: Progressing 05/24/2019 0354  by Jacolyn Reedy, RN Outcome: Progressing   Problem: Coping: Goal: Level of anxiety will decrease 05/24/2019 0355 by Jacolyn Reedy, RN Outcome: Progressing 05/24/2019 0354 by Jacolyn Reedy, RN Outcome: Progressing   Problem: Elimination: Goal: Will not experience complications related to bowel motility 05/24/2019 0355 by Jacolyn Reedy, RN Outcome: Progressing 05/24/2019 0354 by Jacolyn Reedy, RN Outcome: Progressing Goal: Will not experience complications related to urinary retention 05/24/2019 0355 by Jacolyn Reedy, RN Outcome: Progressing 05/24/2019 0354 by Jacolyn Reedy, RN Outcome: Progressing   Problem: Pain Managment: Goal: General experience of comfort will improve 05/24/2019 0355 by Jacolyn Reedy, RN Outcome: Progressing 05/24/2019 0354 by Jacolyn Reedy, RN Outcome: Progressing   Problem: Safety: Goal: Ability to remain free from injury will improve 05/24/2019 0355 by Jacolyn Reedy, RN Outcome: Progressing 05/24/2019 0354 by Jacolyn Reedy, RN Outcome: Progressing   Problem: Skin Integrity: Goal: Risk for impaired skin integrity will decrease 05/24/2019 0355 by Jacolyn Reedy, RN Outcome: Progressing 05/24/2019 0354 by Jacolyn Reedy, RN Outcome: Progressing

## 2019-05-25 LAB — CULTURE, BLOOD (ROUTINE X 2): Culture: NO GROWTH

## 2019-05-25 LAB — NOVEL CORONAVIRUS, NAA (HOSP ORDER, SEND-OUT TO REF LAB; TAT 18-24 HRS): SARS-CoV-2, NAA: NOT DETECTED

## 2019-05-25 LAB — SARS CORONAVIRUS 2 BY RT PCR (HOSPITAL ORDER, PERFORMED IN ~~LOC~~ HOSPITAL LAB): SARS Coronavirus 2: NEGATIVE

## 2019-05-25 MED ORDER — ADULT MULTIVITAMIN W/MINERALS CH: 1.0000 | ORAL_TABLET | Freq: Every day | ORAL | Status: AC

## 2019-05-25 MED ORDER — AMLODIPINE BESYLATE 2.5 MG PO TABS: 2.5000 mg | ORAL_TABLET | Freq: Every day | ORAL | Status: AC

## 2019-05-25 MED ORDER — ENSURE ENLIVE PO LIQD: 237.0000 mL | Freq: Two times a day (BID) | ORAL | Status: AC

## 2019-05-25 NOTE — Progress Notes (Signed)
Completing rounds and introduced myself to Mr. Novo.  I let him know about chaplain services and if he ever needed a chaplain to just let the nurse know.

## 2019-05-25 NOTE — Plan of Care (Signed)

## 2019-05-25 NOTE — Progress Notes (Signed)
SNF has auth, just waiting on a negative covid test,once this is back he can be discharged. NCM informed his son Jose Baldwin of this information also.

## 2019-05-25 NOTE — Discharge Summary (Addendum)
Physician Discharge Summary  LEIDEN PURPLE [id number] DOB: 09/19/1928  PCP: Billie Ruddy, MD  Admitted from: Home Discharged to: SNF  Admit date: 05/20/2019 Discharge date: 05/25/2019  Recommendations for Outpatient Follow-up:   Follow-up Information    MD at SNF. Schedule an appointment as soon as possible for a visit.   Why: To be seen in 2 to 3 days with repeat labs (CBC & BMP).  Please arrange for Palliative Care Consultation [facility]) and follow-up at SNF.       Billie Ruddy, MD. Schedule an appointment as soon as possible for a visit.   Specialty: Family Medicine Why: Upon discharge from SNF. Contact information: [place] [address] [place]: N/A.  Patient being discharged to SNF Equipment/Devices: TBD at SNF  Discharge Condition: Improved and stable.  Overall poor long-term prognosis. CODE STATUS: DNR Diet recommendation: Dysphagia 3 diet and thin liquids, heart healthy.  Discharge Diagnoses:  Principal Problem:   Bradycardia Active Problems:   Parkinson disease ([place])   UTI (urinary tract infection)   Uncontrolled hypertension   Normocytic anemia   CKD (chronic kidney disease), stage III (HCC)   Physical deconditioning   Hematuria   Goals of care, counseling/discussion   Palliative care by specialist   Brief Summary: 85 year old male, lives alone but his son has been staying with him for the last several months since the COVID-19 pandemic, ambulates with the help of a walker, PMH of HTN, prostate cancer s/p radiation seeds, Parkinson's disease, POTS, frequent falls, prior CVA, presented to the ED on 9/9 after his home health RN noticed bradycardia of 32/min which was verified by repeated testing, patient was advised to come to the ED same day but he refused and was brought in by his son the next day.  Also reported intermittent hematuria of 6 weeks duration.  Bradycardia suspected due to  Cardizem and clonidine, Cardizem discontinued and bradycardia has resolved.  No gross hematuria or clinical UTI, discontinued ceftriaxone.  Adult failure to thrive, PMT consulted, discharging to SNF and eventually may be to a long-term care facility.  S/p 1 unit PRBC on 9/11 for Hb 7.1.   Assessment & Plan:   Bradycardia  Heart rate reportedly went down to as low as 32 bpm on day prior to admission as verified by home health RN.  Likely secondary to Cardizem and clonidine.  Cardizem discontinued.  Continued prior home dose of clonidine without significant bradycardia.  Avoid adding new chronotropic medications.  TSH and free T4 normal.  Resolved.  Intermittent hematuria  Reportedly ongoing for 6 weeks in the absence of dysuria, pain, fever or chills.  Completed a 5 days course of oral ciprofloxacin for presumed UTI recently.  History of prostate cancer treated with radiation seeds several years ago by a physician in [place]  No clinical UTI.  Urine culture shows Staphylococcus simulans, likely colonizer.  Completed 2 days of IV ceftriaxone, discontinued after 9/10 dose.  Discussed with patient's son in detail and recommended outpatient urology consultation and follow-up if aggressive management is desired.  He verbalized understanding.  Currently no gross hematuria.  Urine in condom catheter with bag without gross hematuria.  As per son's discussion with PMT, not interested in any further work-up including urology follow-up or cystoscopy.  1 of 4 blood cultures positive for MRSA coagulase-negative Staphylococcus  Likely contamination.  No further work-up or management.  Essential hypertension  Uncontrolled  on clonidine 0.2 mg 3 times daily.  Cardizem discontinued due to significant bradycardia on admission.  As per son, patient tolerated amlodipine in the past without worsening of orthostatic symptoms.  Started amlodipine 2.5 mg daily.  Unable to start ACEI or ARB  due to renal insufficiency.  No beta-blockers due to issues with bradycardia.  Discontinued IV metoprolol.  Reasonable control.  Continue current regimen without changes.  Stage III chronic kidney disease  Creatinine has been widely fluctuating, 1.7-1.8 lately.  Creatinine has plateaued in the 1.4 >1 0.5 > 1.5.  Stable.  Renal ultrasound 8/10: Compatible with medical renal disease.  No concerns for mass or hydronephrosis.  Anemia of chronic disease, possible iron deficiency and chronic kidney disease.  Anemia panel: Iron 42, TIBC 281, saturation ratio 15, ferritin 119.  Folate in July was 20.7 and B12 in July was 814.  Baseline hemoglobin fluctuates between mid 7 to mid 8 lately.  Hemoglobin dropped to 7.1 on 9/11 in the absence of overt bleeding.  This may be contributing also to weakness, orthostatic symptoms and near syncopal episodes.  After discussion with patient and son, s/p 1 unit PRBC on 9/11, hemoglobin up to 8.5, follow CBCs periodically.  Hemoglobin stable in the mid 8 range posttransfusion.  Parkinson's disease with associated POTS  Continue Sinemet.  As per extensive discussion with son, does not really have complete syncopal episodes.  Has been having episodes frequently in the evenings lately where after sitting for prolonged period of time when he stands up to go to bed, he becomes tremulous, stares blankly in space, less responsive but no LOC and then resolves after laying down after 5 to 7 minutes and he has no recollection of the event or incontinence.  Son has been told that this is due to Parkinson's related orthostatic changes.  Was on Florinef in the past which was discontinued.  Frequent falls/physical deconditioning  Secondary to advanced age, frail physical health, Parkinson's disease and POTS  Son wants patient to go to SNF for short-term rehab with palliative care following followed by LTC.  Dementia  Without agitation.  Continue prior home dose of  Seroquel.  Adult failure to thrive  Multifactorial related to advanced age, frail physical health, multiple significant comorbidities.  Son was agreeable for PMT consultation.  PMT input 9/12 appreciated >SNF for STR with palliative care ([facility]) following, avoid aggressive medical interventions and focus on quality and not prolonging suffering, will likely decline any further hospitalizations unless needed for symptom management, no further work-up for hematuria i.e. no urology visit or cystoscopy.  History of prostate cancer  Noted.  No further follow-up.   Dysphagia  Continue dysphagia 3 diet and thin liquids with strict aspiration precautions.    Consultants:  PMT  Procedures:  None  Discharge Instructions  Discharge Instructions    Call MD for:  difficulty breathing, headache or visual disturbances   Complete by: As directed    Call MD for:  extreme fatigue   Complete by: As directed    Call MD for:  persistant dizziness or light-headedness   Complete by: As directed    Call MD for:  persistant nausea and vomiting   Complete by: As directed    Call MD for:  severe uncontrolled pain   Complete by: As directed    Call MD for:  temperature >100.4   Complete by: As directed    Diet - low sodium heart healthy   Complete by: As directed    Dysphagia  3 diet and thin liquids.   Increase activity slowly   Complete by: As directed        Medication List    STOP taking these medications   ciprofloxacin 250 MG tablet Commonly known as: Cipro   diltiazem 300 MG 24 hr capsule Commonly known as: CARDIZEM CD   fludrocortisone 0.1 MG tablet Commonly known as: FLORINEF   ondansetron 4 MG tablet Commonly known as: ZOFRAN   potassium chloride SA 20 MEQ tablet Commonly known as: K-DUR     TAKE these medications   amLODipine 2.5 MG tablet Commonly known as: NORVASC Take 1 tablet (2.5 mg total) by mouth daily. Start taking on: May 26, 2019    aspirin 81 MG chewable tablet Chew 1 tablet (81 mg total) by mouth daily.   carbidopa-levodopa 25-250 MG tablet Commonly known as: SINEMET IR Take 1 tablet by mouth 3 (three) times daily. 9am, 1pm, 6pm What changed: Another medication with the same name was removed. Continue taking this medication, and follow the directions you see here.   cloNIDine 0.2 MG tablet Commonly known as: CATAPRES Take 0.2 mg by mouth See admin instructions. 9am, 1pm, 6pm   feeding supplement (ENSURE ENLIVE) Liqd Take 237 mLs by mouth 2 (two) times daily between meals.   ketoconazole 2 % cream Commonly known as: NIZORAL Apply 1 application topically daily as needed (facial irritation). What changed: additional instructions   multivitamin with minerals Tabs tablet Take 1 tablet by mouth daily. Start taking on: May 26, 2019   polyethylene glycol 17 g packet Commonly known as: MIRALAX / GLYCOLAX Take 17 g by mouth daily as needed for mild constipation.   QUEtiapine 50 MG tablet Commonly known as: SEROQUEL Take 50 mg by mouth daily at 6 PM.   ranibizumab 0.5 MG/0.05ML Soln Commonly known as: LUCENTIS 0.5 mg by Intravitreal route every 8 (eight) weeks.   rosuvastatin 40 MG tablet Commonly known as: CRESTOR Take 1 tablet (40 mg total) by mouth daily at 6 PM.   thiamine 100 MG tablet Commonly known as: VITAMIN B-1 Take 100 mg by mouth daily. 1pm      No Known Allergies    Procedures/Studies: Dg Chest 2 View  Result Date: 05/20/2019 CLINICAL DATA:  Fever. EXAM: CHEST - 2 VIEW COMPARISON:  Radiographs of Feb 05, 2018. FINDINGS: The heart size and mediastinal contours are within normal limits. Atherosclerosis of thoracic aorta is noted. No pneumothorax is noted. Small pleural effusions and bibasilar atelectasis may be present. Old right rib fractures are noted. Status post lower thoracic kyphoplasty. IMPRESSION: Probable mild bibasilar subsegmental atelectasis is noted with small pleural  effusions. Aortic Atherosclerosis (ICD10-I70.0). Electronically Signed   By: Marijo Conception M.D.   On: 05/20/2019 12:18      Subjective: Patient denies complaints.  No pain or dyspnea reported.  Discharge Exam:  Vitals:   05/24/19 1718 05/24/19 1948 05/25/19 0500 05/25/19 1237  BP: (!) 144/72 127/63 (!) 177/87 (!) 136/49  Pulse: 73 80 73 (!) 49  Resp:  17 16 18   Temp:  98.6 F (37 C) 98.7 F (37.1 C) 98.8 F (37.1 C)  TempSrc:  Oral Oral Oral  SpO2:  93% 93%   Weight:   59 kg   Height:        General exam: Pleasant elderly male, moderately built and frail lying comfortably propped up in bed without distress. Respiratory system: Clear to auscultation.  No increased work of breathing. Cardiovascular system: S1 and S2 heard,  RRR.  No JVD or murmurs. Gastrointestinal system: Abdomen is nondistended, soft and nontender. No organomegaly or masses felt. Normal bowel sounds heard. Central nervous system: Alert and oriented x3. No focal neurological deficits. Extremities: Symmetric 5 x 5 power.  Bilateral upper extremities with couple of superficial skin tears with dressing clean and dry.   Skin: No rashes, lesions or ulcers Psychiatry: Judgement and insight appear impaired.  Mood & affect flat.     The results of significant diagnostics from this hospitalization (including imaging, microbiology, ancillary and laboratory) are listed below for reference.     Microbiology: Recent Results (from the past 240 hour(s))  Culture, blood (Routine x 2)     Status: None   Collection Time: 05/20/19 11:43 AM   Specimen: BLOOD  Result Value Ref Range Status   Specimen Description BLOOD RIGHT ANTECUBITAL  Final   Special Requests   Final    BOTTLES DRAWN AEROBIC AND ANAEROBIC Blood Culture results may not be optimal due to an inadequate volume of blood received in culture bottles   Culture   Final    NO GROWTH 5 DAYS Performed at [facility], [place] [address], [place], [place]  [address]    Report Status 05/25/2019 FINAL  Final  Urine culture     Status: Abnormal   Collection Time: 05/20/19  6:03 PM   Specimen: Urine, Random  Result Value Ref Range Status   Specimen Description URINE, RANDOM  Final   Special Requests   Final    Normal Performed at [facility], [place] [address] [place], [place] [address]    Culture >=100,000 COLONIES/mL STAPHYLOCOCCUS SIMULANS (A)  Final   Report Status 05/23/2019 FINAL  Final   Organism ID, Bacteria STAPHYLOCOCCUS SIMULANS (A)  Final      Susceptibility   Staphylococcus simulans - MIC*    CIPROFLOXACIN >=8 RESISTANT Resistant     GENTAMICIN <=0.5 SENSITIVE Sensitive     NITROFURANTOIN <=16 SENSITIVE Sensitive     OXACILLIN >=4 RESISTANT Resistant     TETRACYCLINE <=1 SENSITIVE Sensitive     VANCOMYCIN <=0.5 SENSITIVE Sensitive     TRIMETH/SULFA <=10 SENSITIVE Sensitive     CLINDAMYCIN RESISTANT Resistant     RIFAMPIN <=0.5 SENSITIVE Sensitive     Inducible Clindamycin POSITIVE Resistant     * >=100,000 COLONIES/mL STAPHYLOCOCCUS SIMULANS  SARS Coronavirus 2 [facility] order, Performed in [place] hospital lab) Nasopharyngeal Nasopharyngeal Swab     Status: None   Collection Time: 05/20/19  8:57 PM   Specimen: Nasopharyngeal Swab  Result Value Ref Range Status   SARS Coronavirus 2 NEGATIVE NEGATIVE Final    Comment: (NOTE) If result is NEGATIVE SARS-CoV-2 target nucleic acids are NOT DETECTED. The SARS-CoV-2 RNA is generally detectable in upper and lower  respiratory specimens during the acute phase of infection. The lowest  concentration of SARS-CoV-2 viral copies this assay can detect is 250  copies / mL. A negative result does not preclude SARS-CoV-2 infection  and should not be used as the sole basis for treatment or other  patient management decisions.  A negative result may occur with  improper specimen collection / handling, submission of specimen other  than nasopharyngeal swab, presence of viral  mutation(s) within the  areas targeted by this assay, and inadequate number of viral copies  (<250 copies / mL). A negative result must be combined with clinical  observations, patient history, and epidemiological information. If result is POSITIVE SARS-CoV-2 target nucleic acids are  DETECTED. The SARS-CoV-2 RNA is generally detectable in upper and lower  respiratory specimens dur ing the acute phase of infection.  Positive  results are indicative of active infection with SARS-CoV-2.  Clinical  correlation with patient history and other diagnostic information is  necessary to determine patient infection status.  Positive results do  not rule out bacterial infection or co-infection with other viruses. If result is PRESUMPTIVE POSTIVE SARS-CoV-2 nucleic acids MAY BE PRESENT.   A presumptive positive result was obtained on the submitted specimen  and confirmed on repeat testing.  While 2019 novel coronavirus  (SARS-CoV-2) nucleic acids may be present in the submitted sample  additional confirmatory testing may be necessary for epidemiological  and / or clinical management purposes  to differentiate between  SARS-CoV-2 and other Sarbecovirus currently known to infect humans.  If clinically indicated additional testing with an alternate test  methodology [phone]) is advised. The SARS-CoV-2 RNA is generally  detectable in upper and lower respiratory sp ecimens during the acute  phase of infection. The expected result is Negative. Fact Sheet for Patients:  StrictlyIdeas.no Fact Sheet for Healthcare Providers: BankingDealers.co.za This test is not yet approved or cleared by the [place] FDA and has been authorized for detection and/or diagnosis of SARS-CoV-2 by FDA under an Emergency Use Authorization (EUA).  This EUA will remain in effect (meaning this test can be used) for the duration of the COVID-19 declaration under Section 564(b)(1)  of the Act, 21 U.S.C. section 360bbb-3(b)(1), unless the authorization is terminated or revoked sooner. Performed at [facility], [place] [address] [place], [place] [address]   Culture, blood (Routine x 2)     Status: Abnormal   Collection Time: 05/20/19  8:58 PM   Specimen: BLOOD  Result Value Ref Range Status   Specimen Description BLOOD LEFT ARM  Final   Special Requests   Final    BOTTLES DRAWN AEROBIC AND ANAEROBIC Blood Culture adequate volume   Culture  Setup Time   Final    GRAM POSITIVE COCCI IN CLUSTERS IN BOTH AEROBIC AND ANAEROBIC BOTTLES CRITICAL RESULT CALLED TO, READ BACK BY AND VERIFIED WITH: E. MARTIN,PHARMD 2156 05/21/2019 T. TYSOR    Culture (A)  Final    STAPHYLOCOCCUS SPECIES (COAGULASE NEGATIVE) THE SIGNIFICANCE OF ISOLATING THIS ORGANISM FROM A SINGLE SET OF BLOOD CULTURES WHEN MULTIPLE SETS ARE DRAWN IS UNCERTAIN. PLEASE NOTIFY THE MICROBIOLOGY DEPARTMENT WITHIN ONE WEEK IF SPECIATION AND SENSITIVITIES ARE REQUIRED. Performed at [facility], [place] [address], [place], [place] [address]    Report Status 05/23/2019 FINAL  Final  Blood Culture ID Panel (Reflexed)     Status: Abnormal   Collection Time: 05/20/19  8:58 PM  Result Value Ref Range Status   Enterococcus species NOT DETECTED NOT DETECTED Final   Listeria monocytogenes NOT DETECTED NOT DETECTED Final   Staphylococcus species DETECTED (A) NOT DETECTED Final    Comment: Methicillin (oxacillin) resistant coagulase negative staphylococcus. Possible blood culture contaminant (unless isolated from more than one blood culture draw or clinical case suggests pathogenicity). No antibiotic treatment is indicated for blood  culture contaminants. CRITICAL RESULT CALLED TO, READ BACK BY AND VERIFIED WITH: E. MARTIN,PHARMD 2156 05/21/2019 T. TYSOR    Staphylococcus aureus (BCID) NOT DETECTED NOT DETECTED Final   Methicillin resistance DETECTED (A) NOT DETECTED Final    Comment: CRITICAL RESULT CALLED TO,  READ BACK BY AND VERIFIED WITH: E. MARTIN,PHARMD 2156 05/21/2019 T. TYSOR    Streptococcus species NOT DETECTED  NOT DETECTED Final   Streptococcus agalactiae NOT DETECTED NOT DETECTED Final   Streptococcus pneumoniae NOT DETECTED NOT DETECTED Final   Streptococcus pyogenes NOT DETECTED NOT DETECTED Final   Acinetobacter baumannii NOT DETECTED NOT DETECTED Final   Enterobacteriaceae species NOT DETECTED NOT DETECTED Final   Enterobacter cloacae complex NOT DETECTED NOT DETECTED Final   Escherichia coli NOT DETECTED NOT DETECTED Final   Klebsiella oxytoca NOT DETECTED NOT DETECTED Final   Klebsiella pneumoniae NOT DETECTED NOT DETECTED Final   Proteus species NOT DETECTED NOT DETECTED Final   Serratia marcescens NOT DETECTED NOT DETECTED Final   Haemophilus influenzae NOT DETECTED NOT DETECTED Final   Neisseria meningitidis NOT DETECTED NOT DETECTED Final   Pseudomonas aeruginosa NOT DETECTED NOT DETECTED Final   Candida albicans NOT DETECTED NOT DETECTED Final   Candida glabrata NOT DETECTED NOT DETECTED Final   Candida krusei NOT DETECTED NOT DETECTED Final   Candida parapsilosis NOT DETECTED NOT DETECTED Final   Candida tropicalis NOT DETECTED NOT DETECTED Final    Comment: Performed at [facility], [place] [address] [place], [place] [address]  SARS Coronavirus 2 [facility] order, Performed in [facility] hospital lab) Nasopharyngeal Nasopharyngeal Swab     Status: None   Collection Time: 05/25/19  2:25 PM   Specimen: Nasopharyngeal Swab  Result Value Ref Range Status   SARS Coronavirus 2 NEGATIVE NEGATIVE Final    Comment: (NOTE) If result is NEGATIVE SARS-CoV-2 target nucleic acids are NOT DETECTED. The SARS-CoV-2 RNA is generally detectable in upper and lower  respiratory specimens during the acute phase of infection. The lowest  concentration of SARS-CoV-2 viral copies this assay can detect is 250  copies / mL. A negative result does not preclude SARS-CoV-2 infection  and  should not be used as the sole basis for treatment or other  patient management decisions.  A negative result may occur with  improper specimen collection / handling, submission of specimen other  than nasopharyngeal swab, presence of viral mutation(s) within the  areas targeted by this assay, and inadequate number of viral copies  (<250 copies / mL). A negative result must be combined with clinical  observations, patient history, and epidemiological information. If result is POSITIVE SARS-CoV-2 target nucleic acids are DETECTED. The SARS-CoV-2 RNA is generally detectable in upper and lower  respiratory specimens dur ing the acute phase of infection.  Positive  results are indicative of active infection with SARS-CoV-2.  Clinical  correlation with patient history and other diagnostic information is  necessary to determine patient infection status.  Positive results do  not rule out bacterial infection or co-infection with other viruses. If result is PRESUMPTIVE POSTIVE SARS-CoV-2 nucleic acids MAY BE PRESENT.   A presumptive positive result was obtained on the submitted specimen  and confirmed on repeat testing.  While 2019 novel coronavirus  (SARS-CoV-2) nucleic acids may be present in the submitted sample  additional confirmatory testing may be necessary for epidemiological  and / or clinical management purposes  to differentiate between  SARS-CoV-2 and other Sarbecovirus currently known to infect humans.  If clinically indicated additional testing with an alternate test  methodology [phone]) is advised. The SARS-CoV-2 RNA is generally  detectable in upper and lower respiratory sp ecimens during the acute  phase of infection. The expected result is Negative. Fact Sheet for Patients:  StrictlyIdeas.no Fact Sheet for Healthcare Providers: BankingDealers.co.za This test is not yet approved or cleared by the [place] FDA and has been  authorized  for detection and/or diagnosis of SARS-CoV-2 by FDA under an Emergency Use Authorization (EUA).  This EUA will remain in effect (meaning this test can be used) for the duration of the COVID-19 declaration under Section 564(b)(1) of the Act, 21 U.S.C. section 360bbb-3(b)(1), unless the authorization is terminated or revoked sooner. Performed at [facility], [place] [address], [place], [place] [address]      Labs: CBC: Recent Labs  Lab 05/20/19 1140 05/21/19 0600 05/22/19 0110 05/23/19 0347 05/24/19 0513  WBC 7.9 5.9 5.5 5.2 5.9  NEUTROABS 6.3  --   --   --   --   HGB 7.5* 8.5* 7.1* 8.5* 8.8*  HCT 24.1* 26.4* 22.4* 26.6* 27.1*  MCV 99.6 95.0 94.9 95.3 95.4  PLT 170 188 179 170 [id number]   Basic Metabolic Panel: Recent Labs  Lab 05/20/19 1140 05/20/19 1900 05/22/19 0110 05/23/19 0347 05/24/19 0513  NA 138  --  137 137 138  K 4.1  --  3.7 4.0 4.0  CL 108  --  104 104 105  CO2 23  --  25 25 25   GLUCOSE 98  --  102* 95 100*  BUN 43*  --  34* 28* 30*  CREATININE 1.66*  --  1.47* 1.51* 1.51*  CALCIUM 8.5*  --  8.6* 8.8* 8.6*  MG  --  1.8  --   --   --    Liver Function Tests: Recent Labs  Lab 05/20/19 1140  AST 10*  ALT 5  ALKPHOS 56  BILITOT 0.3  PROT 5.6*  ALBUMIN 2.9*   Urinalysis    Component Value Date/Time   COLORURINE YELLOW 05/20/2019 1750   APPEARANCEUR CLEAR 05/20/2019 1750   LABSPEC 1.017 05/20/2019 1750   PHURINE 5.0 05/20/2019 1750   GLUCOSEU NEGATIVE 05/20/2019 1750   HGBUR SMALL (A) 05/20/2019 1750   BILIRUBINUR NEGATIVE 05/20/2019 1750   KETONESUR NEGATIVE 05/20/2019 1750   PROTEINUR NEGATIVE 05/20/2019 1750   NITRITE NEGATIVE 05/20/2019 1750   LEUKOCYTESUR TRACE (A) 05/20/2019 1750    Discussed in detail with patient's son, updated care and answered questions.  Time coordinating discharge: 45 minutes  SIGNED:  Vernell Leep, MD, FACP, [facility]. [facility]  To contact the attending provider between 7A-7P or the  covering provider during after hours 7P-7A, please log into the web site [email or web address] and access using universal [place] password for that web site. If you do not have the password, please call the hospital operator.

## 2019-05-25 NOTE — Progress Notes (Signed)
Physical Therapy Treatment Patient Details Name: Jose Baldwin MRN: [id number] DOB: June 03, 1929 Today's Date: 05/25/2019    History of Present Illness Jose Baldwin is an 85 y.o. male with medical history significant for hypertension, prostate cancer presenting to the hospital for evaluation of bradycardia and hematuria.  Work up and treatment for uti.    PT Comments    Pt supine, slumped against rail with breakfast tray still present. Pt slow to respond with mostly yes/no answers oriented x 4 with decreased awareness of safety and decline in function. Pt incontinent of stool throughout each attempt with standing and mobility this session limiting gait and requiring total assist for pericare and linen change. Pt pleasant and wanting to move but limited by above. D/C plan remains appropriate.    Follow Up Recommendations  SNF;Supervision/Assistance - 24 hour     Equipment Recommendations  None recommended by PT    Recommendations for Other Services       Precautions / Restrictions Precautions Precautions: Fall Precaution Comments: incontinent bowel    Mobility  Bed Mobility Overal bed mobility: Needs Assistance Bed Mobility: Supine to Sit     Supine to sit: Min assist;HOB elevated     General bed mobility comments: physical assist to slide legs and fully elevate trunk with assist to scoot to EOB. Pt with fully flexed trunk and right lean sitting EOB  Transfers Overall transfer level: Needs assistance   Transfers: Sit to/from Stand Sit to Stand: Mod assist         General transfer comment: mod assist to stand from bed, chair and BSC with max cues for hand placement, physical assist to rise and stabilize pt maintaining flexed trunk with right lean  Ambulation/Gait Ambulation/Gait assistance: Min assist Gait Distance (Feet): 10 Feet Assistive device: Rolling walker (2 wheeled) Gait Pattern/deviations: Shuffle;Trunk flexed;Narrow base of support   Gait velocity  interpretation: <1.8 ft/sec, indicate of risk for recurrent falls General Gait Details: poor posture with flexed, right lean, shuffling gait, assist to control, direct and advance RW. Limited by incontinent stool with 3 attempts to stand and step   Stairs             Wheelchair Mobility    Modified Rankin (Stroke Patients Only)       Balance Overall balance assessment: Needs assistance   Sitting balance-Leahy Scale: Poor Sitting balance - Comments: flexed with right lean Postural control: Right lateral lean   Standing balance-Leahy Scale: Poor Standing balance comment: min-mod assist for standing balance with bil UE supported, flexed trunk and right lean                            Cognition Arousal/Alertness: Awake/alert Behavior During Therapy: Flat affect Overall Cognitive Status: Impaired/Different from baseline Area of Impairment: Safety/judgement;Awareness                           Awareness: Intellectual   General Comments: pt oriented, slumped against rail on arrival with incontinent stool and unaware. INcreased assist and poor balance compared to prior session with pt unaware      Exercises      General Comments        Pertinent Vitals/Pain Pain Assessment: No/denies pain    Home Living                      Prior Function  PT Goals (current goals can now be found in the care plan section) Progress towards PT goals: Not progressing toward goals - comment(decreased activity tolerance and strength this session)    Frequency    Min 2X/week      PT Plan Current plan remains appropriate;Frequency needs to be updated    Co-evaluation              AM-PAC PT "6 Clicks" Mobility   Outcome Measure  Help needed turning from your back to your side while in a flat bed without using bedrails?: A Little Help needed moving from lying on your back to sitting on the side of a flat bed without using  bedrails?: A Little Help needed moving to and from a bed to a chair (including a wheelchair)?: A Lot Help needed standing up from a chair using your arms (e.g., wheelchair or bedside chair)?: A Lot Help needed to walk in hospital room?: A Little Help needed climbing 3-5 steps with a railing? : Total 6 Click Score: 14    End of Session Equipment Utilized During Treatment: Gait belt Activity Tolerance: Patient tolerated treatment well Patient left: in chair;with call bell/phone within reach;with chair alarm set Nurse Communication: Mobility status PT Visit Diagnosis: Unsteadiness on feet (R26.81);Muscle weakness (generalized) (M62.81)     Time: [id number] PT Time Calculation (min) (ACUTE ONLY): 46 min  Charges:  $Gait Training: 8-22 mins $Therapeutic Activity: 23-37 mins                     Dyer, PT Acute Rehabilitation Services Pager: [phone] Office: [place] 05/25/2019, 12:19 PM

## 2019-05-25 NOTE — Discharge Instructions (Signed)

## 2019-05-25 NOTE — TOC Transition Note (Signed)
Transition of Care [facility]) - CM/SW Discharge Note   Patient Details  Name: Jose Baldwin MRN: [id number] Date of Birth: 1928-11-04  Transition of Care [facility]) CM/SW Contact:  Zenon Mayo, RN Phone Number: 05/25/2019, 3:42 PM   Clinical Narrative:    Patient for dc today to [facility], son notified, Lexine Baton at [employer] [employer] notified and MD.  Bernadette Hoit notified.  ptar called for transport, DNR form is on the chart.  Patient will be going to room 503, RN to call report to [phone].   Final next level of care: Skilled Nursing Facility Barriers to Discharge: No Barriers Identified   Patient Goals and CMS Choice Patient states their goals for this hospitalization and ongoing recovery are:: SNF CMS Medicare.gov Compare Post Acute Care list provided to:: Patient Represenative (must comment)(Son) Choice offered to / list presented to : Adult Children  Discharge Placement              Patient chooses bed at: [facility] Patient to be transferred to facility by: ptar Name of family member notified: Daren Patient and family notified of of transfer: 05/25/19  Discharge Plan and Services   Discharge Planning Services: CM Consult Post Acute Care Choice: [place]          DME Arranged: (NA)         HH Arranged: NA          Social Determinants of Health (SDOH) Interventions     Readmission Risk Interventions Readmission Risk Prevention Plan 05/23/2019  Transportation Screening Complete  PCP or Specialist Appt within 3-5 Days Complete  HRI or [place] Complete  Social Work Consult for [place] Planning/Counseling Complete  Palliative Care Screening Not Applicable  Medication Review Press photographer) Complete  Some recent data might be hidden

## 2019-05-26 ENCOUNTER — Encounter: Payer: Self-pay | Admitting: Internal Medicine

## 2019-05-26 NOTE — Progress Notes (Signed)
This encounter was created in error - please disregard.

## 2019-05-26 NOTE — Progress Notes (Deleted)
: Provider:  Hennie Duos., MD Location:  [facility] Room Number: [id number] Place of Service:  SNF [phone])  PCP: Billie Ruddy, MD Patient Care Team: Billie Ruddy, MD as PCP - General (Family Medicine)  Extended Emergency Contact Information Primary Emergency Contact: [facility] Address: [place], [place] [address] [name] of [place] Phone: [phone] Mobile Phone: [phone] Relation: Son    Allergies: Patient has no known allergies.  Chief Complaint  Patient presents with  . New Admit To SNF    New admission to [facility]    HPI: Patient is an 85 y.o. male who   Past Medical History:  Diagnosis Date  . Hypertension   . Prostate cancer ([place])   . Syncope     Past Surgical History:  Procedure Laterality Date  . BACK SURGERY    . IR VERTEBROPLASTY CERV/THOR BX INC UNI/BIL INC/INJECT/IMAGING  01/31/2018  . IR VERTEBROPLASTY CERV/THOR BX INC UNI/BIL INC/INJECT/IMAGING  03/24/2019    Allergies as of 05/26/2019   No Known Allergies     Medication List       Accurate as of May 26, 2019 10:14 AM. If you have any questions, ask your nurse or doctor.        amLODipine 2.5 MG tablet Commonly known as: NORVASC Take 1 tablet (2.5 mg total) by mouth daily.   aspirin 81 MG chewable tablet Chew 1 tablet (81 mg total) by mouth daily.   carbidopa-levodopa 25-250 MG tablet Commonly known as: SINEMET IR Take 1 tablet by mouth 3 (three) times daily. 9am, 1pm, 6pm   cloNIDine 0.2 MG tablet Commonly known as: CATAPRES Take 0.2 mg by mouth 3 (three) times daily. 9am, 1pm, 6pm   feeding supplement (ENSURE ENLIVE) Liqd Take 237 mLs by mouth 2 (two) times daily between meals.   ketoconazole 2 % cream Commonly known as: NIZORAL Apply 1 application topically daily as needed (facial irritation).   multivitamin with minerals Tabs tablet Take 1 tablet by mouth daily.   polyethylene  glycol 17 g packet Commonly known as: MIRALAX / GLYCOLAX Take 17 g by mouth daily as needed for mild constipation.   QUEtiapine 50 MG tablet Commonly known as: SEROQUEL Take 50 mg by mouth daily at 6 PM.   ranibizumab 0.5 MG/0.05ML Soln Commonly known as: LUCENTIS 0.5 mg by Intravitreal route every 8 (eight) weeks. Performed in physician office   rosuvastatin 40 MG tablet Commonly known as: CRESTOR Take 1 tablet (40 mg total) by mouth daily at 6 PM.   thiamine 100 MG tablet Commonly known as: VITAMIN B-1 Take 100 mg by mouth daily. 1pm       No orders of the defined types were placed in this encounter.   Immunization History  Administered Date(s) Administered  . Fluad Quad(high Dose 65+) 05/23/2019  . Influenza, High Dose Seasonal PF 06/26/2017, 06/17/2018  . Influenza-Unspecified 06/22/2016, 06/26/2017  . Pneumococcal Polysaccharide-23 01/07/2015  . Zoster Recombinat (Shingrix) 12/11/2017, 03/18/2018    Social History   Tobacco Use  . Smoking status: Former Smoker    Packs/day: 2.00    Years: 30.00    Pack years: 60.00    Types: Cigarettes    Quit date: 1990    Years since quitting: 30.7  . Smokeless tobacco: Never Used  Substance Use Topics  . Alcohol use: Yes    Alcohol/week: 14.0 standard drinks  Types: 14 Cans of beer per week    Family history is   Family History  Problem Relation Age of Onset  . Heart attack Father 80      Review of Systems  DATA OBTAINED: from patient, nurse, medical record, family member GENERAL:  no fevers, fatigue, appetite changes SKIN: No itching, or rash EYES: No eye pain, redness, discharge EARS: No earache, tinnitus, change in hearing NOSE: No congestion, drainage or bleeding  MOUTH/THROAT: No mouth or tooth pain, No sore throat RESPIRATORY: No cough, wheezing, SOB CARDIAC: No chest pain, palpitations, lower extremity edema  GI: No abdominal pain, No N/V/D or constipation, No heartburn or reflux  GU: No  dysuria, frequency or urgency, or incontinence  MUSCULOSKELETAL: No unrelieved bone/joint pain NEUROLOGIC: No headache, dizziness or focal weakness PSYCHIATRIC: No c/o anxiety or sadness   Vitals:   05/26/19 1004  BP: (!) 150/67  Pulse: 81  Resp: 18  Temp: (!) 97.5 F (36.4 C)    SpO2 Readings from Last 1 Encounters:  05/25/19 93%   Body mass index is 16.26 kg/m.     Physical Exam  GENERAL APPEARANCE: Alert, conversant,  No acute distress.  SKIN: No diaphoresis rash HEAD: Normocephalic, atraumatic  EYES: Conjunctiva/lids clear. Pupils round, reactive. EOMs intact.  EARS: External exam WNL, canals clear. Hearing grossly normal.  NOSE: No deformity or discharge.  MOUTH/THROAT: Lips w/o lesions  RESPIRATORY: Breathing is even, unlabored. Lung sounds are clear   CARDIOVASCULAR: Heart RRR no murmurs, rubs or gallops. No peripheral edema.   GASTROINTESTINAL: Abdomen is soft, non-tender, not distended w/ normal bowel sounds. GENITOURINARY: Bladder non tender, not distended  MUSCULOSKELETAL: No abnormal joints or musculature NEUROLOGIC:  Cranial nerves 2-12 grossly intact. Moves all extremities  PSYCHIATRIC: Mood and affect appropriate to situation, no behavioral issues  Patient Active Problem List   Diagnosis Date Noted  . Bradycardia 05/22/2019  . Goals of care, counseling/discussion   . Palliative care by specialist   . Uncontrolled hypertension 05/21/2019  . Normocytic anemia 05/21/2019  . CKD (chronic kidney disease), stage III ([place]) 05/21/2019  . Physical deconditioning 05/21/2019  . Hematuria 05/21/2019  . UTI (urinary tract infection) 05/20/2019  . Hypokalemia 04/20/2019  . Hypomagnesemia 04/20/2019  . Parkinson disease ([place]) 04/20/2019  . Cardiac arrhythmia 04/20/2019  . Acute renal failure superimposed on stage 3 chronic kidney disease ([place]) 04/20/2019  . Protein-calorie malnutrition, severe 03/31/2019  . Cerebral embolism with cerebral infarction 03/30/2019   . POTS (postural orthostatic tachycardia syndrome) 03/28/2019  . HTN (hypertension) 02/04/2018  . Prostate cancer ([place]) 02/04/2018  . Orthostatic hypotension 02/04/2018  . Iron deficiency anemia 02/04/2018  . Syncope 02/02/2018      Labs reviewed: Basic Metabolic Panel:    Component Value Date/Time   NA 138 05/24/2019 0513   K 4.0 05/24/2019 0513   CL 105 05/24/2019 0513   CO2 25 05/24/2019 0513   GLUCOSE 100 (H) 05/24/2019 0513   BUN 30 (H) 05/24/2019 0513   CREATININE 1.51 (H) 05/24/2019 0513   CALCIUM 8.6 (L) 05/24/2019 0513   PROT 5.6 (L) 05/20/2019 1140   ALBUMIN 2.9 (L) 05/20/2019 1140   AST 10 (L) 05/20/2019 1140   ALT 5 05/20/2019 1140   ALKPHOS 56 05/20/2019 1140   BILITOT 0.3 05/20/2019 1140   GFRNONAA 41 (L) 05/24/2019 0513   GFRAA 47 (L) 05/24/2019 0513    Recent Labs    04/22/19 0441 04/23/19 0428  05/20/19 1900 05/22/19 0110 05/23/19 0347 05/24/19 0513  NA  142 143   < >  --  137 137 138  K 3.0* 4.0   < >  --  3.7 4.0 4.0  CL 104 110   < >  --  104 104 105  CO2 29 26   < >  --  25 25 25   GLUCOSE 104* 102*   < >  --  102* 95 100*  BUN 28* 28*   < >  --  34* 28* 30*  CREATININE 1.84* 1.73*   < >  --  1.47* 1.51* 1.51*  CALCIUM 7.9* 7.9*   < >  --  8.6* 8.8* 8.6*  MG 1.7 1.7  --  1.8  --   --   --    < > = values in this interval not displayed.   Liver Function Tests: Recent Labs    03/30/19 0432 04/20/19 1701 05/20/19 1140  AST 16 35 10*  ALT 5 17 5   ALKPHOS 68 80 56  BILITOT 0.5 0.5 0.3  PROT 5.5* 6.3* 5.6*  ALBUMIN 3.2* 3.3* 2.9*   No results for input(s): LIPASE, AMYLASE in the last 8760 hours. No results for input(s): AMMONIA in the last 8760 hours. CBC: Recent Labs    04/20/19 1701  04/23/19 0428 05/20/19 1140  05/22/19 0110 05/23/19 0347 05/24/19 0513  WBC 8.5   < > 6.5 7.9   < > 5.5 5.2 5.9  NEUTROABS 6.0  --  4.4 6.3  --   --   --   --   HGB 9.6*   < > 7.6* 7.5*   < > 7.1* 8.5* 8.8*  HCT 29.9*   < > 23.3* 24.1*   < >  22.4* 26.6* 27.1*  MCV 92.6   < > 93.2 99.6   < > 94.9 95.3 95.4  PLT 191   < > 136* 170   < > 179 170 170   < > = values in this interval not displayed.   Lipid Recent Labs    03/30/19 0432  CHOL 207*  HDL 62  LDLCALC 131*  TRIG 70    Cardiac Enzymes: No results for input(s): CKTOTAL, CKMB, CKMBINDEX, TROPONINI in the last 8760 hours. BNP: No results for input(s): BNP in the last 8760 hours. No results found for: [facility] Lab Results  Component Value Date   HGBA1C 5.4 03/30/2019   Lab Results  Component Value Date   TSH 1.798 05/21/2019   Lab Results  Component Value Date   [id number] 03/28/2019   Lab Results  Component Value Date   FOLATE 20.7 03/28/2019   Lab Results  Component Value Date   IRON 42 (L) 05/21/2019   TIBC 281 05/21/2019   FERRITIN 119 05/21/2019    Imaging and Procedures obtained prior to SNF admission: Dg Chest 2 View  Result Date: 05/20/2019 CLINICAL DATA:  Fever. EXAM: CHEST - 2 VIEW COMPARISON:  Radiographs of Feb 05, 2018. FINDINGS: The heart size and mediastinal contours are within normal limits. Atherosclerosis of thoracic aorta is noted. No pneumothorax is noted. Small pleural effusions and bibasilar atelectasis may be present. Old right rib fractures are noted. Status post lower thoracic kyphoplasty. IMPRESSION: Probable mild bibasilar subsegmental atelectasis is noted with small pleural effusions. Aortic Atherosclerosis (ICD10-I70.0). Electronically Signed   By: Marijo Conception M.D.   On: 05/20/2019 12:18     Not all labs, radiology exams or other studies done during hospitalization come through on my EPIC note; however they are reviewed  by me.    Assessment and Plan  No problem-specific Assessment & Plan notes found for this encounter.   Hennie Duos, MD

## 2019-05-27 NOTE — Telephone Encounter (Signed)
LVM for pt to call office and schedule a Hosp F/U visit for UTI

## 2019-05-29 ENCOUNTER — Non-Acute Institutional Stay (SKILLED_NURSING_FACILITY): Payer: Medicare Other | Admitting: Internal Medicine

## 2019-05-29 ENCOUNTER — Encounter: Payer: Self-pay | Admitting: Internal Medicine

## 2019-05-29 DIAGNOSIS — R001 Bradycardia, unspecified: Secondary | ICD-10-CM

## 2019-05-29 DIAGNOSIS — I10 Essential (primary) hypertension: Secondary | ICD-10-CM

## 2019-05-29 DIAGNOSIS — F0281 Dementia in other diseases classified elsewhere with behavioral disturbance: Secondary | ICD-10-CM

## 2019-05-29 DIAGNOSIS — D649 Anemia, unspecified: Secondary | ICD-10-CM | POA: Diagnosis not present

## 2019-05-29 DIAGNOSIS — R000 Tachycardia, unspecified: Secondary | ICD-10-CM

## 2019-05-29 DIAGNOSIS — I951 Orthostatic hypotension: Secondary | ICD-10-CM

## 2019-05-29 DIAGNOSIS — E785 Hyperlipidemia, unspecified: Secondary | ICD-10-CM

## 2019-05-29 DIAGNOSIS — R3121 Asymptomatic microscopic hematuria: Secondary | ICD-10-CM | POA: Diagnosis not present

## 2019-05-29 DIAGNOSIS — G20 Parkinson's disease: Secondary | ICD-10-CM | POA: Diagnosis not present

## 2019-05-29 DIAGNOSIS — G90A Postural orthostatic tachycardia syndrome (POTS): Secondary | ICD-10-CM

## 2019-05-29 NOTE — Telephone Encounter (Signed)
Spoke with pt son who is on pt DPR states that pt is in a nursing home and will be out in 30 days, will call the office then and schedule pt a Hospital f/u appointment

## 2019-05-29 NOTE — Progress Notes (Signed)
: Provider:  Hennie Duos., MD Location:  [facility] Room Number: [id number] Place of Service:  SNF ([phone])  PCP: Billie Ruddy, MD Patient Care Team: Billie Ruddy, MD as PCP - General (Family Medicine)  Extended Emergency Contact Information Primary Emergency Contact: [facility] Address: [place], [place] [address] [name] of [place] Phone: [phone] Mobile Phone: [phone] Relation: Son     Allergies: Patient has no known allergies.  Chief Complaint  Patient presents with  . New Admit To SNF    New admission to [facility] SNF    HPI: Patient is an 85 y.o. male with hypertension, prostate cancer status post radiation seeds, Parkinson's disease, P OTS, frequent falls, prior CVA, who presented to the ED on 9/9 after his home health nurse noticed bradycardia of 32 bpm.  Patient refused to come to the ED that day but was brought to the ED by his son the next day.  Patient also reported intermittent hematuria about 6 weeks duration.  Patient was admitted to [facility] from 9/9-14 where she was worked up for bradycardia, hematuria and anemia of chronic disease.  Patient's bradycardia improved after Cardizem was discontinued, home dose of clonidine was continued without significant bradycardia.  Patient had no clinical UTI, there was no gross hematuria patient completed 2 days of treatment with IV Rocephin.  Patient is to follow-up with urology as outpatient.  Patient's anemia was worked up with no apparent contributing factor versus other than chronic disease.  Patient's hemoglobin dropped to 7.1 and was transfused 1 unit PRBC with a follow-up hemoglobin 8.5.  Patient is admitted to skilled nursing facility for OT/PT.  While at skilled nursing facility patient will be followed for dementia with behaviors treated with Seroquel, Parkinson's treated with Sinemet IR and hyperlipidemia treated with Crestor.   Past Medical History:  Diagnosis Date  . Hypertension   . Prostate cancer ([place])   . Syncope     Past Surgical History:  Procedure Laterality Date  . BACK SURGERY    . IR VERTEBROPLASTY CERV/THOR BX INC UNI/BIL INC/INJECT/IMAGING  01/31/2018  . IR VERTEBROPLASTY CERV/THOR BX INC UNI/BIL INC/INJECT/IMAGING  03/24/2019    Allergies as of 05/29/2019   No Known Allergies     Medication List       Accurate as of May 29, 2019 12:56 PM. If you have any questions, ask your nurse or doctor.        amLODipine 2.5 MG tablet Commonly known as: NORVASC Take 1 tablet (2.5 mg total) by mouth daily.   aspirin 81 MG chewable tablet Chew 1 tablet (81 mg total) by mouth daily.   carbidopa-levodopa 25-250 MG tablet Commonly known as: SINEMET IR Take 1 tablet by mouth 3 (three) times daily. 9am, 1pm, 6pm   cloNIDine 0.2 MG tablet Commonly known as: CATAPRES Take 0.2 mg by mouth 3 (three) times daily. 9am, 1pm, 6pm   feeding supplement (ENSURE ENLIVE) Liqd Take 237 mLs by mouth 2 (two) times daily between meals.   ketoconazole 2 % cream Commonly known as: NIZORAL Apply 1 application topically daily as needed (facial irritation).   multivitamin with minerals Tabs tablet Take 1 tablet by mouth daily.   polyethylene glycol 17 g packet Commonly known as: MIRALAX / GLYCOLAX Take 17 g by mouth daily as needed for mild constipation.   QUEtiapine 50 MG tablet Commonly known  as: SEROQUEL Take 50 mg by mouth daily at 6 PM.   ranibizumab 0.5 MG/0.05ML Soln Commonly known as: LUCENTIS 0.5 mg by Intravitreal route every 8 (eight) weeks. Performed in physician office   rosuvastatin 40 MG tablet Commonly known as: CRESTOR Take 1 tablet (40 mg total) by mouth daily at 6 PM.   thiamine 100 MG tablet Commonly known as: VITAMIN B-1 Take 100 mg by mouth daily. 1pm       No orders of the defined types were placed in this encounter.   Immunization History  Administered Date(s)  Administered  . Fluad Quad(high Dose 65+) 05/23/2019  . Influenza, High Dose Seasonal PF 06/26/2017, 06/17/2018  . Influenza-Unspecified 06/22/2016, 06/26/2017  . Pneumococcal Polysaccharide-23 01/07/2015  . Zoster Recombinat (Shingrix) 12/11/2017, 03/18/2018    Social History   Tobacco Use  . Smoking status: Former Smoker    Packs/day: 2.00    Years: 30.00    Pack years: 60.00    Types: Cigarettes    Quit date: 1990    Years since quitting: 30.7  . Smokeless tobacco: Never Used  Substance Use Topics  . Alcohol use: Yes    Alcohol/week: 14.0 standard drinks    Types: 14 Cans of beer per week    Family history is   Family History  Problem Relation Age of Onset  . Heart attack Father 63      Review of Systems   unable to obtain secondary to dementia; nursing-no acute concerns    Vitals:   05/29/19 1243  BP: 123/68  Pulse: 77  Resp: 17  Temp: 97.9 F (36.6 C)  SpO2: 94%    SpO2 Readings from Last 1 Encounters:  05/29/19 94%   Body mass index is 17.15 kg/m.     Physical Exam  GENERAL APPEARANCE: Alert, conversant,  No acute distress.  SKIN: No diaphoresis rash HEAD: Normocephalic, atraumatic  EYES: Conjunctiva/lids clear. Pupils round, reactive. EOMs intact.  EARS: External exam WNL, canals clear. Hearing grossly normal.  NOSE: No deformity or discharge.  MOUTH/THROAT: Lips w/o lesions  RESPIRATORY: Breathing is even, unlabored. Lung sounds are clear   CARDIOVASCULAR: Heart  no murmurs, rubs or gallops. No peripheral edema.   GASTROINTESTINAL: Abdomen is soft, non-tender, not distended w/ normal bowel sounds. GENITOURINARY: Bladder non tender, not distended  MUSCULOSKELETAL: No abnormal joints or musculature NEUROLOGIC:  Cranial nerves 2-12 grossly intact. Moves all extremities  PSYCHIATRIC: Dementia, no behavioral issues  Patient Active Problem List   Diagnosis Date Noted  . Bradycardia 05/22/2019  . Goals of care, counseling/discussion   .  Palliative care by specialist   . Uncontrolled hypertension 05/21/2019  . Normocytic anemia 05/21/2019  . CKD (chronic kidney disease), stage III ([place]) 05/21/2019  . Physical deconditioning 05/21/2019  . Hematuria 05/21/2019  . UTI (urinary tract infection) 05/20/2019  . Hypokalemia 04/20/2019  . Hypomagnesemia 04/20/2019  . Parkinson disease ([place]) 04/20/2019  . Cardiac arrhythmia 04/20/2019  . Acute renal failure superimposed on stage 3 chronic kidney disease ([place]) 04/20/2019  . Protein-calorie malnutrition, severe 03/31/2019  . Cerebral embolism with cerebral infarction 03/30/2019  . POTS (postural orthostatic tachycardia syndrome) 03/28/2019  . HTN (hypertension) 02/04/2018  . Prostate cancer ([place]) 02/04/2018  . Orthostatic hypotension 02/04/2018  . Iron deficiency anemia 02/04/2018  . Syncope 02/02/2018      Labs reviewed: Basic Metabolic Panel:    Component Value Date/Time   NA 138 05/24/2019 0513   K 4.0 05/24/2019 0513   CL 105 05/24/2019 0513  CO2 25 05/24/2019 0513   GLUCOSE 100 (H) 05/24/2019 0513   BUN 30 (H) 05/24/2019 0513   CREATININE 1.51 (H) 05/24/2019 0513   CALCIUM 8.6 (L) 05/24/2019 0513   PROT 5.6 (L) 05/20/2019 1140   ALBUMIN 2.9 (L) 05/20/2019 1140   AST 10 (L) 05/20/2019 1140   ALT 5 05/20/2019 1140   ALKPHOS 56 05/20/2019 1140   BILITOT 0.3 05/20/2019 1140   GFRNONAA 41 (L) 05/24/2019 0513   GFRAA 47 (L) 05/24/2019 0513    Recent Labs    04/22/19 0441 04/23/19 0428  05/20/19 1900 05/22/19 0110 05/23/19 0347 05/24/19 0513  NA 142 143   < >  --  137 137 138  K 3.0* 4.0   < >  --  3.7 4.0 4.0  CL 104 110   < >  --  104 104 105  CO2 29 26   < >  --  25 25 25   GLUCOSE 104* 102*   < >  --  102* 95 100*  BUN 28* 28*   < >  --  34* 28* 30*  CREATININE 1.84* 1.73*   < >  --  1.47* 1.51* 1.51*  CALCIUM 7.9* 7.9*   < >  --  8.6* 8.8* 8.6*  MG 1.7 1.7  --  1.8  --   --   --    < > = values in this interval not displayed.   Liver Function  Tests: Recent Labs    03/30/19 0432 04/20/19 1701 05/20/19 1140  AST 16 35 10*  ALT 5 17 5   ALKPHOS 68 80 56  BILITOT 0.5 0.5 0.3  PROT 5.5* 6.3* 5.6*  ALBUMIN 3.2* 3.3* 2.9*   No results for input(s): LIPASE, AMYLASE in the last 8760 hours. No results for input(s): AMMONIA in the last 8760 hours. CBC: Recent Labs    04/20/19 1701  04/23/19 0428 05/20/19 1140  05/22/19 0110 05/23/19 0347 05/24/19 0513  WBC 8.5   < > 6.5 7.9   < > 5.5 5.2 5.9  NEUTROABS 6.0  --  4.4 6.3  --   --   --   --   HGB 9.6*   < > 7.6* 7.5*   < > 7.1* 8.5* 8.8*  HCT 29.9*   < > 23.3* 24.1*   < > 22.4* 26.6* 27.1*  MCV 92.6   < > 93.2 99.6   < > 94.9 95.3 95.4  PLT 191   < > 136* 170   < > 179 170 170   < > = values in this interval not displayed.   Lipid Recent Labs    03/30/19 0432  CHOL 207*  HDL 62  LDLCALC 131*  TRIG 70    Cardiac Enzymes: No results for input(s): CKTOTAL, CKMB, CKMBINDEX, TROPONINI in the last 8760 hours. BNP: No results for input(s): BNP in the last 8760 hours. No results found for: [facility] Lab Results  Component Value Date   HGBA1C 5.4 03/30/2019   Lab Results  Component Value Date   TSH 1.798 05/21/2019   Lab Results  Component Value Date   [id number] 03/28/2019   Lab Results  Component Value Date   FOLATE 20.7 03/28/2019   Lab Results  Component Value Date   IRON 42 (L) 05/21/2019   TIBC 281 05/21/2019   FERRITIN 119 05/21/2019    Imaging and Procedures obtained prior to SNF admission: Dg Chest 2 View  Result Date: 05/20/2019 CLINICAL DATA:  Fever.  EXAM: CHEST - 2 VIEW COMPARISON:  Radiographs of Feb 05, 2018. FINDINGS: The heart size and mediastinal contours are within normal limits. Atherosclerosis of thoracic aorta is noted. No pneumothorax is noted. Small pleural effusions and bibasilar atelectasis may be present. Old right rib fractures are noted. Status post lower thoracic kyphoplasty. IMPRESSION: Probable mild bibasilar subsegmental  atelectasis is noted with small pleural effusions. Aortic Atherosclerosis (ICD10-I70.0). Electronically Signed   By: Marijo Conception M.D.   On: 05/20/2019 12:18     Not all labs, radiology exams or other studies done during hospitalization come through on my EPIC note; however they are reviewed by me.    Assessment and Plan  Bradycardia-reported as low as 32 bpm; likely secondary to Cardizem and clonidine, Cardizem was discontinued and clonidine was continued but there was no significant bradycardia; TSH and free T4 for normal SNF-admitted for OT/PT; continue to monitor vital signs  Intermittent hematuria-reported to have gone on for 6 weeks in the absence of dysuria pain fever or chills; he completed 5 days of oral Cipro for presumed UTI recently; history of prostate CA treated with radiation seen several years ago; no clinical UTI, 2 days of IV Rocephin and then was discontinued patient to follow-up with urology as outpatient as son was not interested in further work-up including urology follow-up or cyst diascopy SNF- continue supportive care  Anemia of chronic disease-possible iron deficiency anemia possible kidney disease anemia-anemia panel showed iron 42, TIBC 281, saturation ratios 15, ferritin 119 folate in July was 20.7 and B12 in July was 840; baseline hemoglobin fluctuates between mid 7 to mid 8 recently; hemoglobin dropped to 7.1 on 9/11 in the absence of overt bleeding, patient status post 1 unit PRBC and hemoglobin at 8.5 SNF-; follow-up CBC continue thiamine 100 mg daily  Parkinson's disease with associated p.o. TS- patient has intermittent orthostatic hypotension, Florinef was used in the past but was discontinued SNF-continue supportive care  Dementia SNF- no behaviors as long as patient continues Seroquel 50 mg at 6 PM nightly  Hyperlipidemia SNF- not stated as uncontrolled; continue Crestor 40 mg daily  Hypertension-was uncontrolled on meds for increased SNF- continue  clonidine 0.2 mg 3 times daily   Time spent greater than 45 minutes;> 50% of time with patient was spent reviewing records, labs, tests and studies, counseling and developing plan of care  Hennie Duos, MD

## 2019-05-30 ENCOUNTER — Encounter: Payer: Self-pay | Admitting: Internal Medicine

## 2019-05-30 DIAGNOSIS — E785 Hyperlipidemia, unspecified: Secondary | ICD-10-CM | POA: Insufficient documentation

## 2019-05-30 DIAGNOSIS — F0391 Unspecified dementia with behavioral disturbance: Secondary | ICD-10-CM | POA: Insufficient documentation

## 2019-05-30 DIAGNOSIS — F03918 Unspecified dementia, unspecified severity, with other behavioral disturbance: Secondary | ICD-10-CM | POA: Insufficient documentation

## 2019-06-01 ENCOUNTER — Emergency Department (HOSPITAL_COMMUNITY): Payer: Medicare Other

## 2019-06-01 ENCOUNTER — Emergency Department (HOSPITAL_COMMUNITY)
Admission: EM | Admit: 2019-06-01 | Discharge: 2019-06-02 | Disposition: A | Discharge status: Home / Self Care | Payer: Medicare Other | Attending: Emergency Medicine | Admitting: Emergency Medicine

## 2019-06-01 ENCOUNTER — Other Ambulatory Visit: Payer: Self-pay

## 2019-06-01 ENCOUNTER — Encounter (HOSPITAL_COMMUNITY): Payer: Self-pay

## 2019-06-01 DIAGNOSIS — Z7982 Long term (current) use of aspirin: Secondary | ICD-10-CM | POA: Diagnosis not present

## 2019-06-01 DIAGNOSIS — R4182 Altered mental status, unspecified: Secondary | ICD-10-CM | POA: Diagnosis not present

## 2019-06-01 DIAGNOSIS — Z79899 Other long term (current) drug therapy: Secondary | ICD-10-CM | POA: Insufficient documentation

## 2019-06-01 DIAGNOSIS — F0390 Unspecified dementia without behavioral disturbance: Secondary | ICD-10-CM | POA: Diagnosis not present

## 2019-06-01 DIAGNOSIS — Z87891 Personal history of nicotine dependence: Secondary | ICD-10-CM | POA: Insufficient documentation

## 2019-06-01 DIAGNOSIS — N183 Chronic kidney disease, stage 3 (moderate): Secondary | ICD-10-CM | POA: Insufficient documentation

## 2019-06-01 DIAGNOSIS — I129 Hypertensive chronic kidney disease with stage 1 through stage 4 chronic kidney disease, or unspecified chronic kidney disease: Secondary | ICD-10-CM | POA: Insufficient documentation

## 2019-06-01 LAB — URINALYSIS, ROUTINE W REFLEX MICROSCOPIC
Bilirubin Urine: NEGATIVE
Glucose, UA: NEGATIVE mg/dL
Hgb urine dipstick: NEGATIVE
Ketones, ur: 5 mg/dL — AB
Leukocytes,Ua: NEGATIVE
Nitrite: NEGATIVE
Protein, ur: NEGATIVE mg/dL
Specific Gravity, Urine: 1.016 (ref 1.005–1.030)
pH: 6 (ref 5.0–8.0)

## 2019-06-01 LAB — BASIC METABOLIC PANEL
Anion gap: 10 (ref 5–15)
BUN: 37 mg/dL — ABNORMAL HIGH (ref 8–23)
CO2: 24 mmol/L (ref 22–32)
Calcium: 8.7 mg/dL — ABNORMAL LOW (ref 8.9–10.3)
Chloride: 104 mmol/L (ref 98–111)
Creatinine, Ser: 1.61 mg/dL — ABNORMAL HIGH (ref 0.61–1.24)
GFR calc Af Amer: 44 mL/min — ABNORMAL LOW (ref >60)
GFR calc non Af Amer: 38 mL/min — ABNORMAL LOW (ref >60)
Glucose, Bld: 107 mg/dL — ABNORMAL HIGH (ref 70–99)
Potassium: 4.2 mmol/L (ref 3.5–5.1)
Sodium: 138 mmol/L (ref 135–145)

## 2019-06-01 LAB — CBC WITH DIFFERENTIAL/PLATELET
Abs Immature Granulocytes: 0.03 10*3/uL (ref 0.00–0.07)
Basophils Absolute: 0 10*3/uL (ref 0.0–0.1)
Basophils Relative: 0 %
Eosinophils Absolute: 0.4 10*3/uL (ref 0.0–0.5)
Eosinophils Relative: 5 %
HCT: 29.8 % — ABNORMAL LOW (ref 39.0–52.0)
Hemoglobin: 9.6 g/dL — ABNORMAL LOW (ref 13.0–17.0)
Immature Granulocytes: 0 %
Lymphocytes Relative: 23 %
Lymphs Abs: 1.6 10*3/uL (ref 0.7–4.0)
MCH: 31.5 pg (ref 26.0–34.0)
MCHC: 32.2 g/dL (ref 30.0–36.0)
MCV: 97.7 fL (ref 80.0–100.0)
Monocytes Absolute: 0.5 10*3/uL (ref 0.1–1.0)
Monocytes Relative: 8 %
Neutro Abs: 4.4 10*3/uL (ref 1.7–7.7)
Neutrophils Relative %: 64 %
Platelets: 175 10*3/uL (ref 150–400)
RBC: 3.05 MIL/uL — ABNORMAL LOW (ref 4.22–5.81)
RDW: 13.6 % (ref 11.5–15.5)
WBC: 6.9 10*3/uL (ref 4.0–10.5)
nRBC: 0 % (ref 0.0–0.2)

## 2019-06-01 MED ORDER — SODIUM CHLORIDE 0.9 % IV BOLUS
500.0000 mL | Freq: Once | INTRAVENOUS | Status: AC
  Administered 2019-06-01: 500 mL via INTRAVENOUS

## 2019-06-01 NOTE — ED Triage Notes (Signed)
Pt from facililty, normally walkie talkie. After dinner nurse at facility found pt unresponsive. When EMS arrived pt hold self up but weak, hypotensive. Pressure improved pt main complaint now is sleepiness   LKN 1800 Last med seroquel at 1730  Hx- parkinson's, HTN, recent new dx of Cancer  BO 190/84 HR 70  RR 16   97% RA cbg 139

## 2019-06-01 NOTE — ED Provider Notes (Signed)
[facility] EMERGENCY DEPARTMENT Provider Note   CSN: [id number] Arrival date & time: 06/01/19  1938     History   Chief Complaint Chief Complaint  Patient presents with  . Altered Mental Status    HPI Jose Baldwin is a 85 y.o. male.     Patient is an 85 year old male with past medical history of hypertension, prostate cancer, chronic renal insufficiency, dementia.  He is brought today for evaluation of altered mental status.  Patient is a resident of a skilled nursing facility where he was found by staff to be difficult to arouse.  He had just finished eating when the aide stated he was sitting up and not responding.  This lasted for several minutes and seemed to resolve by the time EMS arrived.  Patient tells me he is not having any symptoms and feels fine now.  He was recently admitted for a urinary tract infection and weakness/failure to thrive.  The history is provided by the patient.  Altered Mental Status Presenting symptoms: partial responsiveness   Severity:  Moderate Most recent episode:  Today Episode history:  Single Timing:  Constant Progression:  Improving Associated symptoms: no fever     Past Medical History:  Diagnosis Date  . Hypertension   . Prostate cancer ([place])   . Syncope     Patient Active Problem List   Diagnosis Date Noted  . Dementia with behavioral disturbance ([place]) 05/30/2019  . Hyperlipidemia 05/30/2019  . Bradycardia 05/22/2019  . Goals of care, counseling/discussion   . Palliative care by specialist   . Uncontrolled hypertension 05/21/2019  . Normocytic anemia 05/21/2019  . CKD (chronic kidney disease), stage III ([place]) 05/21/2019  . Physical deconditioning 05/21/2019  . Hematuria 05/21/2019  . UTI (urinary tract infection) 05/20/2019  . Hypokalemia 04/20/2019  . Hypomagnesemia 04/20/2019  . Parkinson disease ([place]) 04/20/2019  . Cardiac arrhythmia 04/20/2019  . Acute renal failure superimposed on stage 3 chronic  kidney disease ([place]) 04/20/2019  . Protein-calorie malnutrition, severe 03/31/2019  . Cerebral embolism with cerebral infarction 03/30/2019  . POTS (postural orthostatic tachycardia syndrome) 03/28/2019  . HTN (hypertension) 02/04/2018  . Prostate cancer ([employer]) 02/04/2018  . Orthostatic hypotension 02/04/2018  . Iron deficiency anemia 02/04/2018  . Syncope 02/02/2018    Past Surgical History:  Procedure Laterality Date  . BACK SURGERY    . IR VERTEBROPLASTY CERV/THOR BX INC UNI/BIL INC/INJECT/IMAGING  01/31/2018  . IR VERTEBROPLASTY CERV/THOR BX INC UNI/BIL INC/INJECT/IMAGING  03/24/2019        Home Medications    Prior to Admission medications   Medication Sig Start Date End Date Taking? Authorizing Provider  amLODipine (NORVASC) 2.5 MG tablet Take 1 tablet (2.5 mg total) by mouth daily. 05/26/19   Hongalgi, Lenis Dickinson, MD  aspirin 81 MG chewable tablet Chew 1 tablet (81 mg total) by mouth daily. 04/01/19   Nita Sells, MD  carbidopa-levodopa (SINEMET IR) 25-250 MG tablet Take 1 tablet by mouth 3 (three) times daily. 9am, 1pm, 6pm     [provider]  cloNIDine (CATAPRES) 0.2 MG tablet Take 0.2 mg by mouth 3 (three) times daily. 9am, 1pm, 6pm    [provider]  feeding supplement, ENSURE ENLIVE, (ENSURE ENLIVE) LIQD Take 237 mLs by mouth 2 (two) times daily between meals. 05/25/19   Hongalgi, Lenis Dickinson, MD  ketoconazole (NIZORAL) 2 % cream Apply 1 application topically daily as needed (facial irritation). 04/23/19   Aline August, MD  Multiple Vitamin (MULTIVITAMIN WITH MINERALS) TABS  tablet Take 1 tablet by mouth daily. 05/26/19   Hongalgi, Lenis Dickinson, MD  polyethylene glycol (MIRALAX / GLYCOLAX) 17 g packet Take 17 g by mouth daily as needed for mild constipation. 04/23/19   Aline August, MD  QUEtiapine (SEROQUEL) 50 MG tablet Take 50 mg by mouth daily at 6 PM.    [provider]  ranibizumab (LUCENTIS) 0.5 MG/0.05ML SOLN 0.5 mg by Intravitreal route every  8 (eight) weeks. Performed in physician office    [provider]  rosuvastatin (CRESTOR) 40 MG tablet Take 1 tablet (40 mg total) by mouth daily at 6 PM. 04/23/19   Aline August, MD  thiamine (VITAMIN B-1) 100 MG tablet Take 100 mg by mouth daily. 1pm    [provider]    Family History Family History  Problem Relation Age of Onset  . Heart attack Father 77    Social History Social History   Tobacco Use  . Smoking status: Former Smoker    Packs/day: 2.00    Years: 30.00    Pack years: 60.00    Types: Cigarettes    Quit date: 1990    Years since quitting: 30.7  . Smokeless tobacco: Never Used  Substance Use Topics  . Alcohol use: Yes    Alcohol/week: 14.0 standard drinks    Types: 14 Cans of beer per week  . Drug use: Never     Allergies   Patient has no known allergies.   Review of Systems Review of Systems  Constitutional: Negative for fever.  All other systems reviewed and are negative.    Physical Exam Updated Vital Signs BP (!) 176/74   Pulse 73   Temp 97.7 F (36.5 C) (Oral)   Resp 15   Ht 6\' 1"  (1.854 m)   Wt 59 kg   SpO2 98%   BMI 17.15 kg/m   Physical Exam Vitals signs and nursing note reviewed.  Constitutional:      General: He is not in acute distress.    Appearance: He is well-developed. He is not diaphoretic.  HENT:     Head: Normocephalic and atraumatic.  Eyes:     Extraocular Movements: Extraocular movements intact.     Pupils: Pupils are equal, round, and reactive to light.  Neck:     Musculoskeletal: Normal range of motion and neck supple.  Cardiovascular:     Rate and Rhythm: Normal rate and regular rhythm.     Heart sounds: No murmur. No friction rub.  Pulmonary:     Effort: Pulmonary effort is normal. No respiratory distress.     Breath sounds: Normal breath sounds. No wheezing or rales.  Abdominal:     General: Bowel sounds are normal. There is no distension.     Palpations: Abdomen is soft.      Tenderness: There is no abdominal tenderness.  Musculoskeletal: Normal range of motion.  Skin:    General: Skin is warm and dry.  Neurological:     General: No focal deficit present.     Mental Status: He is alert.     Cranial Nerves: No cranial nerve deficit.     Sensory: No sensory deficit.     Motor: No weakness.     Coordination: Coordination normal.     Comments: Patient is awake and alert.  He follows commands and answers questions appropriately.  He does seem somewhat disoriented, however this seems to be his baseline.      ED Treatments / Results  Labs (all  labs ordered are listed, but only abnormal results are displayed) Labs Reviewed  BASIC METABOLIC PANEL  CBC WITH DIFFERENTIAL/PLATELET  URINALYSIS, ROUTINE W REFLEX MICROSCOPIC    EKG EKG Interpretation  Date/Time:  Monday June 01 2019 19:43:59 EDT Ventricular Rate:  81 PR Interval:    QRS Duration: 146 QT Interval:  432 QTC Calculation: 502 R Axis:   -76 Text Interpretation:  Sinus rhythm Supraventricular bigeminy Left bundle branch block Confirmed by Veryl Speak [id number]) on 06/01/2019 8:07:52 PM   Radiology No results found.  Procedures Procedures (including critical care time)  Medications Ordered in ED Medications  sodium chloride 0.9 % bolus 500 mL (has no administration in time range)     Initial Impression / Assessment and Plan / ED Course  I have reviewed the triage vital signs and the nursing notes.  Pertinent labs & imaging results that were available during my care of the patient were reviewed by me and considered in my medical decision making (see chart for details).  Patient presenting with complaints of an unresponsive episode that lasted several minutes, then resolved prior to arriving here.  Patient is awake and alert and following commands.  He is moving all extremities and I see no obvious focal deficits.  Patient's work-up shows a negative head CT, clear urinalysis, clear  chest x-ray, and laboratory studies that are essentially unremarkable.  Patient has remained awake and alert throughout his ED stay.  I spoke with the patient's son Daren to update him on his father's condition. Daren has informed me that his father has these episodes frequently and believes that it is related to the Parkinson's and orthostasis with his blood pressures.  He feels comfortable with the disposition of his father returning to his extended care facility.  Final Clinical Impressions(s) / ED Diagnoses   Final diagnoses:  None    ED Discharge Orders    None       Veryl Speak, MD 06/01/19 2226

## 2019-06-01 NOTE — Discharge Instructions (Addendum)
Continue medications as previously prescribed.  Return to the emergency department for high fever, increased confusion, or other new and concerning symptoms.

## 2019-06-11 DIAGNOSIS — R001 Bradycardia, unspecified: Secondary | ICD-10-CM | POA: Diagnosis not present

## 2019-06-11 DIAGNOSIS — M6281 Muscle weakness (generalized): Secondary | ICD-10-CM | POA: Diagnosis not present

## 2019-06-11 DIAGNOSIS — E119 Type 2 diabetes mellitus without complications: Secondary | ICD-10-CM | POA: Diagnosis not present

## 2019-06-11 DIAGNOSIS — H353231 Exudative age-related macular degeneration, bilateral, with active choroidal neovascularization: Secondary | ICD-10-CM | POA: Diagnosis not present

## 2019-06-11 DIAGNOSIS — D631 Anemia in chronic kidney disease: Secondary | ICD-10-CM | POA: Diagnosis not present

## 2019-06-11 DIAGNOSIS — I951 Orthostatic hypotension: Secondary | ICD-10-CM | POA: Diagnosis not present

## 2019-06-11 DIAGNOSIS — D649 Anemia, unspecified: Secondary | ICD-10-CM | POA: Diagnosis not present

## 2019-06-11 DIAGNOSIS — J069 Acute upper respiratory infection, unspecified: Secondary | ICD-10-CM | POA: Diagnosis not present

## 2019-06-11 DIAGNOSIS — I129 Hypertensive chronic kidney disease with stage 1 through stage 4 chronic kidney disease, or unspecified chronic kidney disease: Secondary | ICD-10-CM | POA: Diagnosis not present

## 2019-06-11 DIAGNOSIS — E43 Unspecified severe protein-calorie malnutrition: Secondary | ICD-10-CM | POA: Diagnosis not present

## 2019-06-11 DIAGNOSIS — R1314 Dysphagia, pharyngoesophageal phase: Secondary | ICD-10-CM | POA: Diagnosis not present

## 2019-06-11 DIAGNOSIS — I10 Essential (primary) hypertension: Secondary | ICD-10-CM | POA: Diagnosis not present

## 2019-06-11 DIAGNOSIS — R2681 Unsteadiness on feet: Secondary | ICD-10-CM | POA: Diagnosis not present

## 2019-06-11 DIAGNOSIS — N183 Chronic kidney disease, stage 3 unspecified: Secondary | ICD-10-CM | POA: Diagnosis not present

## 2019-06-11 DIAGNOSIS — G20 Parkinson's disease: Secondary | ICD-10-CM | POA: Diagnosis not present

## 2019-06-11 DIAGNOSIS — R278 Other lack of coordination: Secondary | ICD-10-CM | POA: Diagnosis not present

## 2019-06-11 DIAGNOSIS — R1312 Dysphagia, oropharyngeal phase: Secondary | ICD-10-CM | POA: Diagnosis not present

## 2019-06-11 DIAGNOSIS — I69328 Other speech and language deficits following cerebral infarction: Secondary | ICD-10-CM | POA: Diagnosis not present

## 2019-06-11 DIAGNOSIS — I499 Cardiac arrhythmia, unspecified: Secondary | ICD-10-CM | POA: Diagnosis not present

## 2019-06-11 DIAGNOSIS — E876 Hypokalemia: Secondary | ICD-10-CM | POA: Diagnosis not present

## 2019-06-11 DIAGNOSIS — D509 Iron deficiency anemia, unspecified: Secondary | ICD-10-CM | POA: Diagnosis not present

## 2019-06-23 ENCOUNTER — Encounter: Payer: Self-pay | Admitting: Internal Medicine

## 2019-06-23 DIAGNOSIS — H353231 Exudative age-related macular degeneration, bilateral, with active choroidal neovascularization: Secondary | ICD-10-CM | POA: Diagnosis not present

## 2019-06-23 NOTE — Progress Notes (Deleted)
Location:  [place] Room Number: 106-P Place of Service:  SNF [id number]) Provider:  Granville Lewis, PA-C  Billie Ruddy, MD  Patient Care Team: Billie Ruddy, MD as PCP - General [facility] Medicine)  Extended Emergency Contact Information Primary Emergency Contact: Orpah Melter Address: [place], [place] [address] [name] of [place] Phone: [phone] Mobile Phone: [phone] Relation: Son  Code Status:  DNR Goals of care: Advanced Directive information Advanced Directives 05/29/2019  Does Patient Have a Medical Advance Directive? Yes  Type of Advance Directive Out of facility DNR (pink MOST or yellow form);Healthcare Power of Attorney  Does patient want to make changes to medical advance directive? No - Patient declined  Copy of [place] in Chart? Yes - validated most recent copy scanned in chart (See row information)  Would patient like information on creating a medical advance directive? No - Patient declined  Pre-existing out of facility DNR order (yellow form or pink MOST form) Yellow form placed in chart (order not valid for inpatient use)     Chief Complaint  Patient presents with  . Medical Management of Chronic Issues    Routine [facility] visit  . Immunizations    Prevnar    HPI:  Pt is an 85 y.o. male seen today for medical management of chronic diseases.      Past Medical History:  Diagnosis Date  . Hypertension   . Prostate cancer ([place])   . Syncope    Past Surgical History:  Procedure Laterality Date  . BACK SURGERY    . IR VERTEBROPLASTY CERV/THOR BX INC UNI/BIL INC/INJECT/IMAGING  01/31/2018  . IR VERTEBROPLASTY CERV/THOR BX INC UNI/BIL INC/INJECT/IMAGING  03/24/2019    No Known Allergies  Outpatient Encounter Medications as of 06/23/2019  Medication Sig  . amLODipine (NORVASC) 2.5 MG tablet Take 1 tablet (2.5 mg total) by mouth daily.  Marland Kitchen aspirin 81 MG  chewable tablet Chew 1 tablet (81 mg total) by mouth daily.  . carbidopa-levodopa (SINEMET IR) 25-250 MG tablet Take 1 tablet by mouth 3 (three) times daily. 9am, 1pm, 7pm  . cloNIDine (CATAPRES) 0.2 MG tablet Take 0.2 mg by mouth 3 (three) times daily. 9am, 1pm, 7pm  . feeding supplement, ENSURE ENLIVE, (ENSURE ENLIVE) LIQD Take 237 mLs by mouth 2 (two) times daily between meals.  . Multiple Vitamin (MULTIVITAMIN WITH MINERALS) TABS tablet Take 1 tablet by mouth daily.  . polyethylene glycol (MIRALAX / GLYCOLAX) 17 g packet Take 17 g by mouth daily as needed for mild constipation.  . QUEtiapine (SEROQUEL) 50 MG tablet Take 50 mg by mouth daily at 6 PM.  . ranibizumab (LUCENTIS) 0.5 MG/0.05ML SOLN 0.5 mg by Intravitreal route every 8 (eight) weeks. Performed in physician office  . rosuvastatin (CRESTOR) 40 MG tablet Take 1 tablet (40 mg total) by mouth daily at 6 PM.  . thiamine (VITAMIN B-1) 100 MG tablet Take 100 mg by mouth See admin instructions. Take one tablet (100 mg) by mouth daily at 1pm  . [DISCONTINUED] ketoconazole (NIZORAL) 2 % cream Apply 1 application topically daily as needed (facial irritation).   No facility-administered encounter medications on file as of 06/23/2019.     Review of Systems  Immunization History  Administered Date(s) Administered  . Fluad Quad(high Dose 65+) 05/23/2019  . Influenza, High Dose Seasonal PF 06/26/2017, 06/17/2018  . Influenza-Unspecified 06/22/2016, 06/26/2017  .  Pneumococcal Polysaccharide-23 01/07/2015  . Zoster Recombinat (Shingrix) 12/11/2017, 03/18/2018   Pertinent  Health Maintenance Due  Topic Date Due  . PNA vac Low Risk Adult (2 of 2 - PCV13) 01/07/2016  . INFLUENZA VACCINE  Completed   No flowsheet data found. Functional Status Survey:    Vitals:   06/23/19 1126  BP: 125/69  Pulse: 74  Resp: 18  Temp: 98.3 F (36.8 C)  TempSrc: Oral  SpO2: 100%  Weight: 126 lb 9.6 oz (57.4 kg)  Height: 6\' 1"  (1.854 m)   Body mass  index is 16.7 kg/m. Physical Exam  Labs reviewed: Recent Labs    04/22/19 0441 04/23/19 0428  05/20/19 1900  05/23/19 0347 05/24/19 0513 06/01/19 1957  NA 142 143   < >  --    < > 137 138 138  K 3.0* 4.0   < >  --    < > 4.0 4.0 4.2  CL 104 110   < >  --    < > 104 105 104  CO2 29 26   < >  --    < > 25 25 24   GLUCOSE 104* 102*   < >  --    < > 95 100* 107*  BUN 28* 28*   < >  --    < > 28* 30* 37*  CREATININE 1.84* 1.73*   < >  --    < > 1.51* 1.51* 1.61*  CALCIUM 7.9* 7.9*   < >  --    < > 8.8* 8.6* 8.7*  MG 1.7 1.7  --  1.8  --   --   --   --    < > = values in this interval not displayed.   Recent Labs    03/30/19 0432 04/20/19 1701 05/20/19 1140  AST 16 35 10*  ALT 5 17 5   ALKPHOS 68 80 56  BILITOT 0.5 0.5 0.3  PROT 5.5* 6.3* 5.6*  ALBUMIN 3.2* 3.3* 2.9*   Recent Labs    04/23/19 0428 05/20/19 1140  05/23/19 0347 05/24/19 0513 06/01/19 1957  WBC 6.5 7.9   < > 5.2 5.9 6.9  NEUTROABS 4.4 6.3  --   --   --  4.4  HGB 7.6* 7.5*   < > 8.5* 8.8* 9.6*  HCT 23.3* 24.1*   < > 26.6* 27.1* 29.8*  MCV 93.2 99.6   < > 95.3 95.4 97.7  PLT 136* 170   < > 170 170 175   < > = values in this interval not displayed.   Lab Results  Component Value Date   TSH 1.798 05/21/2019   Lab Results  Component Value Date   HGBA1C 5.4 03/30/2019   Lab Results  Component Value Date   CHOL 207 (H) 03/30/2019   HDL 62 03/30/2019   LDLCALC 131 (H) 03/30/2019   TRIG 70 03/30/2019   CHOLHDL 3.3 03/30/2019    Significant Diagnostic Results in last 30 days:  Dg Chest 1 View  Result Date: 06/01/2019 CLINICAL DATA:  Altered mental status EXAM: CHEST  1 VIEW COMPARISON:  May 20, 2019 FINDINGS: There is atelectatic change in the right base. The lungs elsewhere are clear. Heart is upper normal in size with pulmonary vascularity normal. No adenopathy. There is aortic atherosclerosis. There old healed rib fractures on the right. Patient has had previous kyphoplasty procedure in the  lower thoracic region. IMPRESSION: Right base atelectasis. No edema or consolidation. Stable cardiac silhouette. Aortic Atherosclerosis (  ICD10-I70.0). Electronically Signed   By: Lowella Grip III M.D.   On: 06/01/2019 21:13   Ct Head Wo Contrast  Result Date: 06/01/2019 CLINICAL DATA:  85 year old with altered level of consciousness. Patient reports syncope today. EXAM: CT HEAD WITHOUT CONTRAST TECHNIQUE: Contiguous axial images were obtained from the base of the skull through the vertex without intravenous contrast. COMPARISON:  Head CT and brain MRI 03/29/2019. FINDINGS: Brain: No acute intracranial hemorrhage. Age related atrophy. Unchanged chronic small vessel ischemia. Bilateral basal gangliar infarcts, expected evolution of the recent right basal ganglia infarct since prior exams. No evidence of acute ischemia. No hydrocephalus, mass effect, or midline shift. No extra-axial collection. Vascular: Atherosclerosis of skullbase vasculature without hyperdense vessel or abnormal calcification. Skull: No fracture or focal lesion. Sinuses/Orbits: No acute finding. Bilateral lens extraction. Remote right maxillary sinus fracture. Other: None. IMPRESSION: 1. No acute intracranial abnormality. 2. Unchanged atrophy and chronic small vessel ischemia. 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 Electronically Signed   By: Keith Rake M.D.   On: 06/01/2019 21:10    Assessment/Plan There are no diagnoses linked to this encounter.   Family/ staff Communication: ***  Labs/tests ordered:  ***

## 2019-06-24 ENCOUNTER — Encounter: Payer: Self-pay | Admitting: Internal Medicine

## 2019-06-24 NOTE — Progress Notes (Deleted)
Location:  [place] Room Number: 106-P Place of Service:  SNF [phone]) Provider:  Granville Lewis, PA-C  Billie Ruddy, MD  Patient Care Team: Billie Ruddy, MD as PCP - General [facility] Medicine)  Extended Emergency Contact Information Primary Emergency Contact: Orpah Melter Address: [place], [place] [address] [name] of [place] Phone: [phone] Mobile Phone: [phone] Relation: Son  Code Status:  Full Code Goals of care: Advanced Directive information Advanced Directives 06/23/2019  Does Patient Have a Medical Advance Directive? -  Type of Advance Directive [place]  Does patient want to make changes to medical advance directive? No - Patient declined  Copy of [place] in Chart? Yes - validated most recent copy scanned in chart (See row information)  Would patient like information on creating a medical advance directive? No - Patient declined  Pre-existing out of facility DNR order (yellow form or pink MOST form) Yellow form placed in chart (order not valid for inpatient use)     Chief Complaint  Patient presents with  . Medical Management of Chronic Issues    Routine [facility] visit    HPI:  Pt is an 85 y.o. male seen today for medical management of chronic diseases.      Past Medical History:  Diagnosis Date  . Hypertension   . Prostate cancer ([place])   . Syncope    Past Surgical History:  Procedure Laterality Date  . BACK SURGERY    . IR VERTEBROPLASTY CERV/THOR BX INC UNI/BIL INC/INJECT/IMAGING  01/31/2018  . IR VERTEBROPLASTY CERV/THOR BX INC UNI/BIL INC/INJECT/IMAGING  03/24/2019    No Known Allergies  Outpatient Encounter Medications as of 06/24/2019  Medication Sig  . amLODipine (NORVASC) 2.5 MG tablet Take 1 tablet (2.5 mg total) by mouth daily.  Marland Kitchen aspirin 81 MG chewable tablet Chew 1 tablet (81 mg total) by mouth daily.  .  carbidopa-levodopa (SINEMET IR) 25-250 MG tablet Take 1 tablet by mouth 3 (three) times daily. 9am, 1pm, 7pm  . cloNIDine (CATAPRES) 0.2 MG tablet Take 0.2 mg by mouth 3 (three) times daily. 9am, 1pm, 7pm  . feeding supplement, ENSURE ENLIVE, (ENSURE ENLIVE) LIQD Take 237 mLs by mouth 2 (two) times daily between meals.  . Multiple Vitamin (MULTIVITAMIN WITH MINERALS) TABS tablet Take 1 tablet by mouth daily.  . polyethylene glycol (MIRALAX / GLYCOLAX) 17 g packet Take 17 g by mouth daily as needed for mild constipation.  . QUEtiapine (SEROQUEL) 50 MG tablet Take 50 mg by mouth daily at 6 PM.  . ranibizumab (LUCENTIS) 0.5 MG/0.05ML SOLN 0.5 mg by Intravitreal route every 8 (eight) weeks. Performed in physician office  . rosuvastatin (CRESTOR) 40 MG tablet Take 1 tablet (40 mg total) by mouth daily at 6 PM.  . thiamine (VITAMIN B-1) 100 MG tablet Take 100 mg by mouth See admin instructions. Take one tablet (100 mg) by mouth daily at 1pm   No facility-administered encounter medications on file as of 06/24/2019.     Review of Systems  Immunization History  Administered Date(s) Administered  . Fluad Quad(high Dose 65+) 05/23/2019  . Influenza, High Dose Seasonal PF 06/26/2017, 06/17/2018  . Influenza-Unspecified 06/22/2016, 06/26/2017  . Pneumococcal Polysaccharide-23 01/07/2015  . Zoster Recombinat (Shingrix) 12/11/2017, 03/18/2018   Pertinent  Health Maintenance Due  Topic Date Due  . PNA vac Low Risk Adult (2 of  2 - PCV13) 01/07/2016  . INFLUENZA VACCINE  Completed   No flowsheet data found. Functional Status Survey:    Vitals:   06/24/19 1507  BP: 125/69  Pulse: 74  Resp: 18  Temp: 98.3 F (36.8 C)  TempSrc: Oral  SpO2: 100%  Weight: 126 lb 9.6 oz (57.4 kg)  Height: 6\' 1"  (1.854 m)   Body mass index is 16.7 kg/m. Physical Exam  Labs reviewed: Recent Labs    04/22/19 0441 04/23/19 0428  05/20/19 1900  05/23/19 0347 05/24/19 0513 06/01/19 1957  NA 142 143   < >   --    < > 137 138 138  K 3.0* 4.0   < >  --    < > 4.0 4.0 4.2  CL 104 110   < >  --    < > 104 105 104  CO2 29 26   < >  --    < > 25 25 24   GLUCOSE 104* 102*   < >  --    < > 95 100* 107*  BUN 28* 28*   < >  --    < > 28* 30* 37*  CREATININE 1.84* 1.73*   < >  --    < > 1.51* 1.51* 1.61*  CALCIUM 7.9* 7.9*   < >  --    < > 8.8* 8.6* 8.7*  MG 1.7 1.7  --  1.8  --   --   --   --    < > = values in this interval not displayed.   Recent Labs    03/30/19 0432 04/20/19 1701 05/20/19 1140  AST 16 35 10*  ALT 5 17 5   ALKPHOS 68 80 56  BILITOT 0.5 0.5 0.3  PROT 5.5* 6.3* 5.6*  ALBUMIN 3.2* 3.3* 2.9*   Recent Labs    04/23/19 0428 05/20/19 1140  05/23/19 0347 05/24/19 0513 06/01/19 1957  WBC 6.5 7.9   < > 5.2 5.9 6.9  NEUTROABS 4.4 6.3  --   --   --  4.4  HGB 7.6* 7.5*   < > 8.5* 8.8* 9.6*  HCT 23.3* 24.1*   < > 26.6* 27.1* 29.8*  MCV 93.2 99.6   < > 95.3 95.4 97.7  PLT 136* 170   < > 170 170 175   < > = values in this interval not displayed.   Lab Results  Component Value Date   TSH 1.798 05/21/2019   Lab Results  Component Value Date   HGBA1C 5.4 03/30/2019   Lab Results  Component Value Date   CHOL 207 (H) 03/30/2019   HDL 62 03/30/2019   LDLCALC 131 (H) 03/30/2019   TRIG 70 03/30/2019   CHOLHDL 3.3 03/30/2019    Significant Diagnostic Results in last 30 days:  Dg Chest 1 View  Result Date: 06/01/2019 CLINICAL DATA:  Altered mental status EXAM: CHEST  1 VIEW COMPARISON:  May 20, 2019 FINDINGS: There is atelectatic change in the right base. The lungs elsewhere are clear. Heart is upper normal in size with pulmonary vascularity normal. No adenopathy. There is aortic atherosclerosis. There old healed rib fractures on the right. Patient has had previous kyphoplasty procedure in the lower thoracic region. IMPRESSION: Right base atelectasis. No edema or consolidation. Stable cardiac silhouette. Aortic Atherosclerosis (ICD10-I70.0). Electronically Signed   By:  Lowella Grip III M.D.   On: 06/01/2019 21:13   Ct Head Wo Contrast  Result Date: 06/01/2019 CLINICAL DATA:  85 year old  with altered level of consciousness. Patient reports syncope today. EXAM: CT HEAD WITHOUT CONTRAST TECHNIQUE: Contiguous axial images were obtained from the base of the skull through the vertex without intravenous contrast. COMPARISON:  Head CT and brain MRI 03/29/2019. FINDINGS: Brain: No acute intracranial hemorrhage. Age related atrophy. Unchanged chronic small vessel ischemia. Bilateral basal gangliar infarcts, expected evolution of the recent right basal ganglia infarct since prior exams. No evidence of acute ischemia. No hydrocephalus, mass effect, or midline shift. No extra-axial collection. Vascular: Atherosclerosis of skullbase vasculature without hyperdense vessel or abnormal calcification. Skull: No fracture or focal lesion. Sinuses/Orbits: No acute finding. Bilateral lens extraction. Remote right maxillary sinus fracture. Other: None. IMPRESSION: 1. No acute intracranial abnormality. 2. Unchanged atrophy and chronic small vessel ischemia. 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 Electronically Signed   By: Keith Rake M.D.   On: 06/01/2019 21:10    Assessment/Plan There are no diagnoses linked to this encounter.   Family/ staff Communication: ***  Labs/tests ordered:  ***

## 2019-06-24 NOTE — Progress Notes (Signed)
This encounter was created in error - please disregard.  This encounter was created in error - please disregard.

## 2019-06-25 ENCOUNTER — Non-Acute Institutional Stay (SKILLED_NURSING_FACILITY): Payer: Medicare Other | Admitting: Internal Medicine

## 2019-06-25 DIAGNOSIS — N183 Chronic kidney disease, stage 3 unspecified: Secondary | ICD-10-CM | POA: Diagnosis not present

## 2019-06-25 DIAGNOSIS — I10 Essential (primary) hypertension: Secondary | ICD-10-CM

## 2019-06-25 DIAGNOSIS — D649 Anemia, unspecified: Secondary | ICD-10-CM | POA: Diagnosis not present

## 2019-06-25 DIAGNOSIS — G20 Parkinson's disease: Secondary | ICD-10-CM

## 2019-06-25 DIAGNOSIS — R001 Bradycardia, unspecified: Secondary | ICD-10-CM | POA: Diagnosis not present

## 2019-06-25 DIAGNOSIS — F0281 Dementia in other diseases classified elsewhere with behavioral disturbance: Secondary | ICD-10-CM

## 2019-06-25 NOTE — Progress Notes (Signed)
Location:  [name] Room Number: 106-P Place of Service:  SNF [phone]) Provider:  Granville Lewis, PA-C  Billie Ruddy, MD  Patient Care Team: Billie Ruddy, MD as PCP - General [name])  Extended Emergency Contact Information Primary Emergency Contact: Jose Baldwin Address: [place], [place] [address] [name] of [place] Phone: [phone] Mobile Phone: [phone] Relation: Son  Code Status:  Full Code Goals of care: Advanced Directive information Advanced Directives 06/24/2019  Does Patient Have a Medical Advance Directive? Yes  Type of Advance Directive [place]  Does patient want to make changes to medical advance directive? No - Patient declined  Copy of [place] in Chart? Yes - validated most recent copy scanned in chart (See row information)  Would patient like information on creating a medical advance directive? No - Patient declined  Pre-existing out of facility DNR order (yellow form or pink MOST form) -     Chief Complaint  Patient presents with  . Medical Management of Chronic Issues    Routine [facility] SN[facility] visit  . 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 male seen today for medical management of chronic diseases.  As noted above.  Nursing does not report any recent acute issues.  I do note he did go to the ER for altered mental status on September 21.  No acute etiology was found-it was thought this is most likely secondary to his Parkinson's diagnosis with dementia.  His CT scan of the head as well as urinalysis chest x-ray and lab studies were fairly unremarkable  At times he will appear to be somewhat unresponsive apparently and then returned to his baseline.  He  came here for rehab after hospitalization earlier in September with bradycardia and intermittent hematuria.  Bradycardia improved after Cardizem was discontinued his clonidine was continued.  It was thought he had no clinical evidence of UTI he did receive a short course of Rocephin.  Suggestion for urology follow-up as an outpatient.  Patient's hemoglobin did drop to 7.1 and he did get transfused 1 unit of packed red blood cells with hemoglobin rising to 8.5-- it was 9.6 on lab done on September 21.  Nursing does not report any recent issues today he is resting in bed comfortably he is alert but does not really open his eyes much per nursing staff this is not unusual.  Vital signs appear to be stable   Past Medical History:  Diagnosis Date  . Hypertension   . Prostate cancer ([place])   . Syncope    Past Surgical History:  Procedure Laterality Date  . BACK SURGERY    . IR VERTEBROPLASTY CERV/THOR BX INC UNI/BIL INC/INJECT/IMAGING  01/31/2018  . IR VERTEBROPLASTY CERV/THOR BX INC UNI/BIL INC/INJECT/IMAGING  03/24/2019    No Known Allergies  Outpatient Encounter Medications as of 06/25/2019  Medication Sig  . amLODipine (NORVASC) 2.5 MG tablet Take 1 tablet (2.5 mg total) by mouth daily.  Marland Kitchen aspirin 81 MG chewable tablet Chew 1 tablet (81 mg total) by mouth daily.  . carbidopa-levodopa (SINEMET IR) 25-250 MG tablet Take 1 tablet by mouth 3 (three) times daily. 9am, 1pm, 7pm  . cloNIDine (CATAPRES) 0.2 MG tablet Take 0.2 mg by mouth 3 (three) times daily. 9am, 1pm,  7pm  . feeding supplement, ENSURE ENLIVE, (ENSURE ENLIVE) LIQD Take 237 mLs by mouth 2 (two) times daily between meals.  . Multiple Vitamin (MULTIVITAMIN WITH MINERALS) TABS tablet Take 1 tablet by mouth daily.  . polyethylene glycol (MIRALAX / GLYCOLAX) 17 g packet Take 17 g by mouth daily as needed for mild constipation.  . QUEtiapine (SEROQUEL) 50 MG tablet Take 50 mg by mouth daily at 6 PM.  . ranibizumab (LUCENTIS) 0.5  MG/0.05ML SOLN 0.5 mg by Intravitreal route every 8 (eight) weeks. Performed in physician office  . rosuvastatin (CRESTOR) 40 MG tablet Take 1 tablet (40 mg total) by mouth daily at 6 PM.  . thiamine (VITAMIN B-1) 100 MG tablet Take 100 mg by mouth See admin instructions. Take one tablet (100 mg) by mouth daily at 1pm   No facility-administered encounter medications on file as of 06/25/2019.     Review of Systems   This is limited secondary to patient being a poor historian secondary to dementia-Parkinson's disease-nursing does not report any acute issues  Immunization History  Administered Date(s) Administered  . Fluad Quad(high Dose 65+) 05/23/2019  . Influenza, High Dose Seasonal PF 06/26/2017, 06/17/2018  . Influenza-Unspecified 06/22/2016, 06/26/2017  . Pneumococcal Polysaccharide-23 01/07/2015  . Zoster Recombinat (Shingrix) 12/11/2017, 03/18/2018   Pertinent  Health Maintenance Due  Topic Date Due  . PNA vac Low Risk Adult (2 of 2 - PCV13) 01/07/2016  . INFLUENZA VACCINE  Completed   No flowsheet data found. Functional Status Survey:    Vitals:   06/26/19 1508  BP: 125/69  Pulse: 74  Resp: 18  Temp: 98.3 F (36.8 C)  TempSrc: Oral  SpO2: 100%  Weight: 126 lb 9.6 oz (57.4 kg)  Height: 6\' 1"  (1.854 m)   Body mass index is 16.7 kg/m. Physical Exam  In general this is a somewhat frail-appearing elderly male in no distress lying in bed he is responsive but is not really opening his eyes much.  His skin is warm and dry he does have some solar induced changes with scaling.  Eyes did not really open his eyes much it appears the sclera and conjunctive are clear.  Oropharynx again did not open his mouth very wide but from what I could see the mucous membranes appear fairly moist.  Chest was clear to auscultation with somewhat shallow air entry there is no labored breathing.  Heart distant heart sounds -- was regular rate and rhythm he did not have significant lower  extremity edema.  Abdomen is soft nontender with positive bowel sounds.  Musculoskeletal is able to move all extremities x4 with stiffness especially of lower extremities and significant weakness.  Neurologic as noted above I could not appreciate any tremor could not really appreciate lateralizing findings he is alert he will follow simple verbal commands.  Psych findings consistent with significant dementia he is oriented to self he will follow simple verbal commands.     Labs reviewed: Recent Labs    04/22/19 0441 04/23/19 0428  05/20/19 1900  05/23/19 0347 05/24/19 0513 06/01/19 1957  NA 142 143   < >  --    < > 137 138 138  K 3.0* 4.0   < >  --    < > 4.0 4.0 4.2  CL 104 110   < >  --    < > 104 105 104  CO2 29 26   < >  --    < > 25 25 24   GLUCOSE 104*  102*   < >  --    < > 95 100* 107*  BUN 28* 28*   < >  --    < > 28* 30* 37*  CREATININE 1.84* 1.73*   < >  --    < > 1.51* 1.51* 1.61*  CALCIUM 7.9* 7.9*   < >  --    < > 8.8* 8.6* 8.7*  MG 1.7 1.7  --  1.8  --   --   --   --    < > = values in this interval not displayed.   Recent Labs    03/30/19 0432 04/20/19 1701 05/20/19 1140  AST 16 35 10*  ALT 5 17 5   ALKPHOS 68 80 56  BILITOT 0.5 0.5 0.3  PROT 5.5* 6.3* 5.6*  ALBUMIN 3.2* 3.3* 2.9*   Recent Labs    04/23/19 0428 05/20/19 1140  05/23/19 0347 05/24/19 0513 06/01/19 1957  WBC 6.5 7.9   < > 5.2 5.9 6.9  NEUTROABS 4.4 6.3  --   --   --  4.4  HGB 7.6* 7.5*   < > 8.5* 8.8* 9.6*  HCT 23.3* 24.1*   < > 26.6* 27.1* 29.8*  MCV 93.2 99.6   < > 95.3 95.4 97.7  PLT 136* 170   < > 170 170 175   < > = values in this interval not displayed.   Lab Results  Component Value Date   TSH 1.798 05/21/2019   Lab Results  Component Value Date   HGBA1C 5.4 03/30/2019   Lab Results  Component Value Date   CHOL 207 (H) 03/30/2019   HDL 62 03/30/2019   LDLCALC 131 (H) 03/30/2019   TRIG 70 03/30/2019   CHOLHDL 3.3 03/30/2019    Significant Diagnostic Results  in last 30 days:  Dg Chest 1 View  Result Date: 06/01/2019 CLINICAL DATA:  Altered mental status EXAM: CHEST  1 VIEW COMPARISON:  May 20, 2019 FINDINGS: There is atelectatic change in the right base. The lungs elsewhere are clear. Heart is upper normal in size with pulmonary vascularity normal. No adenopathy. There is aortic atherosclerosis. There old healed rib fractures on the right. Patient has had previous kyphoplasty procedure in the lower thoracic region. IMPRESSION: Right base atelectasis. No edema or consolidation. Stable cardiac silhouette. Aortic Atherosclerosis (ICD10-I70.0). Electronically Signed   By: Lowella Grip III M.D.   On: 06/01/2019 21:13   Ct Head Wo Contrast  Result Date: 06/01/2019 CLINICAL DATA:  85 year old with altered level of consciousness. Patient reports syncope today. EXAM: CT HEAD WITHOUT CONTRAST TECHNIQUE: Contiguous axial images were obtained from the base of the skull through the vertex without intravenous contrast. COMPARISON:  Head CT and brain MRI 03/29/2019. FINDINGS: Brain: No acute intracranial hemorrhage. Age related atrophy. Unchanged chronic small vessel ischemia. Bilateral basal gangliar infarcts, expected evolution of the recent right basal ganglia infarct since prior exams. No evidence of acute ischemia. No hydrocephalus, mass effect, or midline shift. No extra-axial collection. Vascular: Atherosclerosis of skullbase vasculature without hyperdense vessel or abnormal calcification. Skull: No fracture or focal lesion. Sinuses/Orbits: No acute finding. Bilateral lens extraction. Remote right maxillary sinus fracture. Other: None. IMPRESSION: 1. No acute intracranial abnormality. 2. Unchanged atrophy and chronic small vessel ischemia. 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 Electronically Signed   By: Keith Rake M.D.   On: 06/01/2019 21:10    Assessment/Plan   #1 history of Parkinson's disease at this point continue supportive care his  son has expressed desires for conservative care here-he appears to be stable he does continue on Sinemet 25-2 53 times a day apparently does have some history intermittent orthostatic hypotension no longer on Florinef-at this point will monitor.  2.  History of dementia with history of Parkinson's at this point continue supportive care as noted above he is on Seroquel every evening 50 mg.  3.   #hypertension this appears stable he is now on clonidine 0.2 mg twice daily as well as Norvasc 2.5 mg a day recent blood pressures 130/72-132/72-130/80-170/70.  4.  History of bradycardia-this was thought secondary to Cardizem and clonidine his clonidine was continued Cardizem has been discontinued-his thyroid studies apparently were normal in the hospital-at this point appears to be stable he appears to be tolerating the clonidine.  5.  History of intermittent hematuria-again he was treated for UTI empirically in the hospital with a short course of Rocephin.  He does have a history of prostate cancer treated with radiation-there was possible follow-up with urology as an outpatient but his son does not really have interest in this or a cystoscopy-wishes it appears more conservative follow-up.  6.  History of anemia of chronic disease-with possible iron deficiency anemia-possible chronic kidney disease anemia-Baseline hemoglobin is 7-mid 8.  He did receive transfusion of 1 unit packed red blood cells for hemoglobin of 7.1 in the hospital-hemoglobin actually appears to be rising was 9.6 on late September lab-at this point will update a CBC-continue thiamine as well 100 mg daily.  .  7.-History of chronic kidney disease creatinine most recently was 1.6--this appears relatively baseline with recent labs we will have this updated as well.  8.  History of hyperlipidemia he continues on Crestor 40 mg a day.  9.  History of failure to thrive this is thought to be multifactorial-at this point continue supportive  care.  He appears to be comfortable  [phone]

## 2019-06-26 ENCOUNTER — Encounter: Payer: Self-pay | Admitting: Internal Medicine

## 2019-06-26 LAB — CBC AND DIFFERENTIAL
HCT: 30 — AB (ref 41–53)
Hemoglobin: 10.1 — AB (ref 13.5–17.5)
Neutrophils Absolute: 5
Platelets: 136 — AB (ref 150–399)
WBC: 7.1

## 2019-06-26 LAB — BASIC METABOLIC PANEL
BUN: 32 — AB (ref 4–21)
CO2: 25 — AB (ref 13–22)
Chloride: 104 (ref 99–108)
Creatinine: 1.4 — AB (ref 0.6–1.3)
Glucose: 88
Potassium: 4.4 (ref 3.4–5.3)
Sodium: 143 (ref 137–147)

## 2019-06-26 LAB — COMPREHENSIVE METABOLIC PANEL
Calcium: 8.9 (ref 8.7–10.7)
GFR calc Af Amer: 50.54
GFR calc non Af Amer: 43.61

## 2019-06-26 LAB — HEMOGLOBIN A1C: Hemoglobin A1C: 4.9

## 2019-06-26 LAB — CBC: RBC: 3.31 — AB (ref 3.87–5.11)

## 2019-06-26 NOTE — Progress Notes (Signed)
This encounter was created in error - please disregard.

## 2019-07-21 DIAGNOSIS — H353211 Exudative age-related macular degeneration, right eye, with active choroidal neovascularization: Secondary | ICD-10-CM | POA: Diagnosis not present

## 2019-07-21 DIAGNOSIS — H353231 Exudative age-related macular degeneration, bilateral, with active choroidal neovascularization: Secondary | ICD-10-CM | POA: Diagnosis not present

## 2019-07-23 ENCOUNTER — Non-Acute Institutional Stay (SKILLED_NURSING_FACILITY): Payer: Medicare Other | Admitting: Internal Medicine

## 2019-07-23 DIAGNOSIS — W19XXXA Unspecified fall, initial encounter: Secondary | ICD-10-CM

## 2019-07-23 DIAGNOSIS — Y92129 Unspecified place in nursing home as the place of occurrence of the external cause: Secondary | ICD-10-CM | POA: Diagnosis not present

## 2019-07-23 DIAGNOSIS — I10 Essential (primary) hypertension: Secondary | ICD-10-CM | POA: Diagnosis not present

## 2019-07-23 DIAGNOSIS — R269 Unspecified abnormalities of gait and mobility: Secondary | ICD-10-CM | POA: Diagnosis not present

## 2019-07-23 DIAGNOSIS — M25551 Pain in right hip: Secondary | ICD-10-CM | POA: Diagnosis not present

## 2019-07-23 DIAGNOSIS — M25552 Pain in left hip: Secondary | ICD-10-CM | POA: Diagnosis not present

## 2019-07-23 NOTE — Progress Notes (Signed)
This is an acute visit.  Level of care skilled.  [facility]ility [facility]  Chief complaint acute visit secondary to fall.  History of present illness.  Patient is 85 year old male with a history of Parkinson's disease as well as hypertension chronic kidney disease dementia anemia bradycardia and hyperlipidemia.  Apparently this afternoon he was found on the floor on his right side apparently had rolled out of bed-apparently told nursing staff he was trying to go to the restroom.  Initially apparently complained of some right hip discomfort although when I assessed him he appeared to not really complain of this anymore.  It appears he is also sustained a small skin tear to his right shoulder area where a previous bruise had been-there is also a small abrasion on his right forehead.  Patient has a significant history of Parkinson's disease and does not speak much but he is speaking at baseline today.  Past Medical History:  Diagnosis Date  . Hypertension   . Prostate cancer ([place])   . Syncope         Past Surgical History:  Procedure Laterality Date  . BACK SURGERY    . IR VERTEBROPLASTY CERV/THOR BX INC UNI/BIL INC/INJECT/IMAGING  01/31/2018  . IR VERTEBROPLASTY CERV/THOR BX INC UNI/BIL INC/INJECT/IMAGING  03/24/2019    No Known Allergies         Medication Sig  . amLODipine (NORVASC) 2.5 MG tablet Take 1 tablet (2.5 mg total) by mouth daily.  Marland Kitchen aspirin 81 MG chewable tablet Chew 1 tablet (81 mg total) by mouth daily.  . carbidopa-levodopa (SINEMET IR) 25-250 MG tablet Take 1 tablet by mouth 3 (three) times daily. 9am, 1pm, 7pm  . cloNIDine (CATAPRES) 0.2 MG tablet Take 0.2 mg by mouth 3 (three) times daily. 9am, 1pm, 7pm  . feeding supplement, ENSURE ENLIVE, (ENSURE ENLIVE) LIQD Take 237 mLs by mouth 2 (two) times daily between meals.  . Multiple Vitamin (MULTIVITAMIN WITH MINERALS) TABS tablet Take 1 tablet by mouth daily.  . polyethylene glycol (MIRALAX /  GLYCOLAX) 17 g packet Take 17 g by mouth daily as needed for mild constipation.  . QUEtiapine (SEROQUEL) 50 MG tablet Take 50 mg by mouth daily at 6 PM.  . ranibizumab (LUCENTIS) 0.5 MG/0.05ML SOLN 0.5 mg by Intravitreal route every 8 (eight) weeks. Performed in physician office  . rosuvastatin (CRESTOR) 40 MG tablet Take 1 tablet (40 mg total) by mouth daily at 6 PM.  . thiamine (VITAMIN B-1) 100 MG tablet Take 100 mg by mouth See admin instructions. Take one tablet (100 mg) by mouth daily at 1pm   No facility-administered encounter medications on file as of 06/25/2019.     Review of Systems    This is limited secondary to dementia-but when asked he apparently initially complain of some right hip pain and when I initially saw him said he had a little head pain but later denied any pain at all  Nursing has not noted any complaints of shortness of breath or chest pain or any other kind of discomfort      Immunization History  Administered Date(s) Administered  . Fluad Quad(high Dose 65+) 05/23/2019  . Influenza, High Dose Seasonal PF 06/26/2017, 06/17/2018  . Influenza-Unspecified 06/22/2016, 06/26/2017  . Pneumococcal Polysaccharide-23 01/07/2015  . Zoster Recombinat (Shingrix) 12/11/2017, 03/18/2018       Pertinent  Health Maintenance Due  Topic Date Due  . PNA vac Low Risk Adult (2 of 2 - PCV13) 01/07/2016  . INFLUENZA VACCINE  Completed   No flowsheet data found. Functional Status Survey:  Physical exam.\   afebrile-pulses 80-respirations of 18-blood pressure notable for systolic of [id number]   In general this is a somewhat frail-appearing elderly male at baseline he is actually talking a bit more than when I saw him during my last visit he does not appear to be in any distress and appears to be at his baseline.  His skin is warm and dry he does have a small skin tear right shoulder area with an erythematous wound bed this appears to have been an old bruise that opened  up secondary to the fall-she also has a small abrasion with some minimal amount of dried blood right forehead.  Eyes visual acuity appears to be intact sclera and conjunctive are clear pupils appear to be equal round reactive to light.  Oropharynx is clear mucous membranes moist tongue is midline I do not see any evidence of bleeding.  Chest is clear to auscultation with somewhat poor respiratory effort there is no labored breathing.  Heart is somewhat distant appear to be largely regular with an occasional irregular beat he does not have significant edema.  Abdomen is soft nontender with positive bowel sounds.  Musculoskeletal could not appreciate any acute deformities he does have general frailty is able to move all 4 extremities-- when there was gentle flexion and extension at the hip he denied pain-could not appreciate any deformities.  Or leg shortening  Neurologic appears grossly intact his speech appears to be at baseline cranial nerves appear to be intact does not exhibit lateralizing consistent with dementia with history of Parkinson's-he actually is speaking a bit more than when I saw him last time.  Labs.  June 26, 2019.  WBC 7.1 hemoglobin 10.1 platelets 136.  Sodium 143 potassium 4.4 BUN 32.4 creatinine 1.42.  Assessment and plan.  Fall with gait abnormality secondary to Parkinson's disease -he does not appear to have any acute injuries but apparently he complained initially of some right hip pain will have x-rays of the area.  Also will need monitoring per facility fall protocol with neurochecks.  Clinically he appears to be at baseline he is not really complaining of pain when I later checked on him but this will have to be watched.  2.  Hypertension apparently this is been an issue in the past but appears to be stabilized on clonidine 0.2 mg 3 times daily as well as Norvasc 2.5 mg a day-blood pressure after the fall was in the [id number] systolically but I suspect this  was due to the excitement of the fall-other blood pressures systolically appear to run 118-140 range although I do see 1 ofaround170 but this does not appear to be consistent-again this will be monitored fall protocol  #3 right shoulder skin tear again nursing staff will administer topical treatment and monitoring I do not see any sign of infection-he is not complaining of any shoulder discomfort  [phone]

## 2019-07-24 ENCOUNTER — Encounter: Payer: Self-pay | Admitting: Internal Medicine

## 2019-07-25 ENCOUNTER — Emergency Department (HOSPITAL_COMMUNITY): Payer: Medicare Other

## 2019-07-25 ENCOUNTER — Other Ambulatory Visit: Payer: Self-pay

## 2019-07-25 ENCOUNTER — Emergency Department (HOSPITAL_COMMUNITY)
Admission: EM | Admit: 2019-07-25 | Discharge: 2019-07-25 | Disposition: A | Discharge status: Home / Self Care | Payer: Medicare Other | Attending: Emergency Medicine | Admitting: Emergency Medicine

## 2019-07-25 ENCOUNTER — Encounter (HOSPITAL_COMMUNITY): Payer: Self-pay | Admitting: Emergency Medicine

## 2019-07-25 DIAGNOSIS — Z7982 Long term (current) use of aspirin: Secondary | ICD-10-CM | POA: Diagnosis not present

## 2019-07-25 DIAGNOSIS — Z743 Need for continuous supervision: Secondary | ICD-10-CM | POA: Diagnosis not present

## 2019-07-25 DIAGNOSIS — R05 Cough: Secondary | ICD-10-CM | POA: Diagnosis not present

## 2019-07-25 DIAGNOSIS — N183 Chronic kidney disease, stage 3 unspecified: Secondary | ICD-10-CM | POA: Insufficient documentation

## 2019-07-25 DIAGNOSIS — I4891 Unspecified atrial fibrillation: Secondary | ICD-10-CM | POA: Diagnosis not present

## 2019-07-25 DIAGNOSIS — Z79899 Other long term (current) drug therapy: Secondary | ICD-10-CM | POA: Diagnosis not present

## 2019-07-25 DIAGNOSIS — R52 Pain, unspecified: Secondary | ICD-10-CM | POA: Diagnosis not present

## 2019-07-25 DIAGNOSIS — R0902 Hypoxemia: Secondary | ICD-10-CM | POA: Diagnosis not present

## 2019-07-25 DIAGNOSIS — R000 Tachycardia, unspecified: Secondary | ICD-10-CM | POA: Diagnosis not present

## 2019-07-25 DIAGNOSIS — J690 Pneumonitis due to inhalation of food and vomit: Secondary | ICD-10-CM | POA: Diagnosis not present

## 2019-07-25 DIAGNOSIS — R0602 Shortness of breath: Secondary | ICD-10-CM | POA: Diagnosis not present

## 2019-07-25 DIAGNOSIS — R279 Unspecified lack of coordination: Secondary | ICD-10-CM | POA: Diagnosis not present

## 2019-07-25 DIAGNOSIS — I129 Hypertensive chronic kidney disease with stage 1 through stage 4 chronic kidney disease, or unspecified chronic kidney disease: Secondary | ICD-10-CM | POA: Diagnosis not present

## 2019-07-25 DIAGNOSIS — I447 Left bundle-branch block, unspecified: Secondary | ICD-10-CM | POA: Diagnosis not present

## 2019-07-25 DIAGNOSIS — Z87891 Personal history of nicotine dependence: Secondary | ICD-10-CM | POA: Insufficient documentation

## 2019-07-25 DIAGNOSIS — F0390 Unspecified dementia without behavioral disturbance: Secondary | ICD-10-CM | POA: Insufficient documentation

## 2019-07-25 DIAGNOSIS — R230 Cyanosis: Secondary | ICD-10-CM | POA: Diagnosis not present

## 2019-07-25 MED ORDER — AMOXICILLIN-POT CLAVULANATE 875-125 MG PO TABS
1.0000 | ORAL_TABLET | Freq: Once | ORAL | Status: AC
  Administered 2019-07-25: 1 via ORAL
  Filled 2019-07-25: qty 1

## 2019-07-25 MED ORDER — AMOXICILLIN-POT CLAVULANATE 875-125 MG PO TABS: 1.0000 | ORAL_TABLET | Freq: Two times a day (BID) | ORAL | 0 refills | Status: DC

## 2019-07-25 NOTE — ED Triage Notes (Signed)
Per EMS pt from [facility]. Staff reported emesis on bed and L side of body today. Reported pt cyanotic RA 70%. Applied NRB and was only to get him up to 92%. EMS reported 99% on 8 L/min. Pt c/c of dyspnea initially, no longer complains of anything. A/O x4. Hx of Parkinson's and Altzheimer's.

## 2019-07-25 NOTE — Discharge Instructions (Signed)
Your x-ray shows that you have likely aspirated stomach contents into the left side of your lung, this may cause a pneumonia, it is unclear whether your lung is inflamed or whether there is infection so we will start an antibiotic which you need to take twice a day for the next 7 days Augmentin, twice daily for 7 days Please come back to the hospital for increasing difficulty breathing, chest pain or persistent vomiting or fever

## 2019-07-25 NOTE — ED Notes (Signed)
[facility] called by sharon and  They are aware pt  Pick up time is still pending

## 2019-07-25 NOTE — ED Notes (Signed)
[facility] called for transport.  

## 2019-07-25 NOTE — ED Provider Notes (Signed)
[facility] EMERGENCY DEPARTMENT Provider Note   CSN: [id number] Arrival date & time: 07/25/19  1244     History   Chief Complaint Chief Complaint  Patient presents with  . Emesis  . Shortness of Breath    HPI Jose Baldwin is a 85 y.o. male.     HPI  85 year old male, history of hypertension, dementia, dysphagia on a dysphagia 3 diet because of risk of aspiration.  He has chronic kidney disease.  He stays at [facility] and today was witnessed to have a possible aspiration event, had vomited at the side of the bed and had some hypoxia when staff checked him.  He required a nonrebreather but that was weaned off and by the time he arrived he was 96% on room air stating that he felt better, was not having any shortness of breath, minimal coughing, no fevers, no diarrhea, no swelling of the legs.  The patient does have dementia, level 5 caveat applies, he is not a very good historian.  Past Medical History:  Diagnosis Date  . Hypertension   . Prostate cancer ([place])   . Syncope     Patient Active Problem List   Diagnosis Date Noted  . Dementia with behavioral disturbance ([place]) 05/30/2019  . Hyperlipidemia 05/30/2019  . Bradycardia 05/22/2019  . Goals of care, counseling/discussion   . Palliative care by specialist   . Uncontrolled hypertension 05/21/2019  . Normocytic anemia 05/21/2019  . CKD (chronic kidney disease), stage III 05/21/2019  . Physical deconditioning 05/21/2019  . Hematuria 05/21/2019  . UTI (urinary tract infection) 05/20/2019  . Hypokalemia 04/20/2019  . Hypomagnesemia 04/20/2019  . Parkinson disease ([place]) 04/20/2019  . Cardiac arrhythmia 04/20/2019  . Acute renal failure superimposed on stage 3 chronic kidney disease ([place]) 04/20/2019  . Protein-calorie malnutrition, severe 03/31/2019  . Cerebral embolism with cerebral infarction 03/30/2019  . POTS (postural orthostatic tachycardia syndrome) 03/28/2019  . HTN (hypertension) 02/04/2018   . Prostate cancer ([place]) 02/04/2018  . Orthostatic hypotension 02/04/2018  . Iron deficiency anemia 02/04/2018  . Syncope 02/02/2018    Past Surgical History:  Procedure Laterality Date  . BACK SURGERY    . IR VERTEBROPLASTY CERV/THOR BX INC UNI/BIL INC/INJECT/IMAGING  01/31/2018  . IR VERTEBROPLASTY CERV/THOR BX INC UNI/BIL INC/INJECT/IMAGING  03/24/2019        Home Medications    Prior to Admission medications   Medication Sig Start Date End Date Taking? Authorizing Provider  amLODipine (NORVASC) 2.5 MG tablet Take 1 tablet (2.5 mg total) by mouth daily. 05/26/19  Yes Hongalgi, Lenis Dickinson, MD  aspirin 81 MG chewable tablet Chew 1 tablet (81 mg total) by mouth daily. 04/01/19  Yes Nita Sells, MD  carbidopa-levodopa (SINEMET IR) 25-250 MG tablet Take 1 tablet by mouth 3 (three) times daily. 9am, 1pm, 7pm   Yes [provider]  cloNIDine (CATAPRES) 0.2 MG tablet Take 0.2 mg by mouth 3 (three) times daily. 9am, 1pm, 7pm   Yes [provider]  feeding supplement, ENSURE ENLIVE, (ENSURE ENLIVE) LIQD Take 237 mLs by mouth 2 (two) times daily between meals. 05/25/19  Yes Hongalgi, Lenis Dickinson, MD  Multiple Vitamin (MULTIVITAMIN WITH MINERALS) TABS tablet Take 1 tablet by mouth daily. 05/26/19  Yes Hongalgi, Lenis Dickinson, MD  polyethylene glycol (MIRALAX / GLYCOLAX) 17 g packet Take 17 g by mouth daily as needed for mild constipation. 04/23/19  Yes Aline August, MD  QUEtiapine (SEROQUEL) 50 MG tablet Take 50 mg by mouth daily at  6 PM.   Yes [provider]  rosuvastatin (CRESTOR) 40 MG tablet Take 1 tablet (40 mg total) by mouth daily at 6 PM. 04/23/19  Yes Starla Link, Kshitiz, MD  thiamine (VITAMIN B-1) 100 MG tablet Take 100 mg by mouth See admin instructions. Take one tablet (100 mg) by mouth daily at 1pm   Yes [provider]  amoxicillin-clavulanate (AUGMENTIN) 875-125 MG tablet Take 1 tablet by mouth every 12 (twelve) hours. 07/25/19   Noemi Chapel, MD   ranibizumab (LUCENTIS) 0.5 MG/0.05ML SOLN 0.5 mg by Intravitreal route every 8 (eight) weeks. Performed in physician office    [provider]    Family History Family History  Problem Relation Age of Onset  . Heart attack Father 18    Social History Social History   Tobacco Use  . Smoking status: Former Smoker    Packs/day: 2.00    Years: 30.00    Pack years: 60.00    Types: Cigarettes    Quit date: 1990    Years since quitting: 30.8  . Smokeless tobacco: Never Used  Substance Use Topics  . Alcohol use: Yes    Alcohol/week: 14.0 standard drinks    Types: 14 Cans of beer per week  . Drug use: Never     Allergies   Patient has no known allergies.   Review of Systems Review of Systems  Unable to perform ROS: Dementia     Physical Exam Updated Vital Signs BP (!) 154/68 (BP Location: Left Arm)   Pulse (!) 57   Temp 97.9 F (36.6 C) (Oral)   Resp 14   SpO2 95%   Physical Exam Vitals signs and nursing note reviewed.  Constitutional:      General: He is not in acute distress.    Appearance: He is well-developed.  HENT:     Head: Normocephalic and atraumatic.     Mouth/Throat:     Pharynx: No oropharyngeal exudate.  Eyes:     General: No scleral icterus.       Right eye: No discharge.        Left eye: No discharge.     Conjunctiva/sclera: Conjunctivae normal.     Pupils: Pupils are equal, round, and reactive to light.  Neck:     Musculoskeletal: Normal range of motion and neck supple.     Thyroid: No thyromegaly.     Vascular: No JVD.  Cardiovascular:     Rate and Rhythm: Normal rate and regular rhythm.     Heart sounds: Normal heart sounds. No murmur. No friction rub. No gallop.   Pulmonary:     Effort: Pulmonary effort is normal. No respiratory distress.     Breath sounds: Examination of the right-lower field reveals rhonchi. Examination of the left-lower field reveals rhonchi. Rhonchi present. No wheezing or rales.  Abdominal:      General: Bowel sounds are normal. There is no distension.     Palpations: Abdomen is soft. There is no mass.     Tenderness: There is no abdominal tenderness.  Musculoskeletal: Normal range of motion.        General: No tenderness.  Lymphadenopathy:     Cervical: No cervical adenopathy.  Skin:    General: Skin is warm and dry.     Findings: No erythema or rash.  Neurological:     Mental Status: He is alert.     Coordination: Coordination normal.  Psychiatric:        Behavior: Behavior normal.  ED Treatments / Results  Labs (all labs ordered are listed, but only abnormal results are displayed) Labs Reviewed - No data to display  EKG EKG Interpretation  Date/Time:  Saturday July 25 2019 12:53:38 EST Ventricular Rate:  78 PR Interval:    QRS Duration: 141 QT Interval:  417 QTC Calculation: 475 R Axis:   -60 Text Interpretation: Atrial fibrillation Short PR interval Left bundle branch block since last tracing no significant change Confirmed by Noemi Chapel [phone]) on 07/25/2019 1:02:34 PM   Radiology Dg Chest Port 1 View  Result Date: 07/25/2019 CLINICAL DATA:  Cough. Vomiting. Clinical concern for aspiration. EXAM: PORTABLE CHEST 1 VIEW COMPARISON:  06/01/2019 FINDINGS: The cardiac silhouette remains borderline enlarged. The aorta remains tortuous and calcified. Interval minimal patchy opacity in the left lower lobe. Otherwise, clear lungs. The lungs remain hyperexpanded. Old, healed right rib fractures. Diffuse osteopenia. Lower thoracic vertebral compression deformity and kyphoplasty material, unchanged. IMPRESSION: 1. Interval minimal patchy atelectasis, pneumonia or aspiration pneumonitis in the left lower lobe. 2. Stable changes of COPD. 3. Aortic atherosclerosis. Electronically Signed   By: Claudie Revering M.D.   On: 07/25/2019 13:53    Procedures Procedures (including critical care time)  Medications Ordered in ED Medications  amoxicillin-clavulanate  (AUGMENTIN) 875-125 MG per tablet 1 tablet (has no administration in time range)     Initial Impression / Assessment and Plan / ED Course  I have reviewed the triage vital signs and the nursing notes.  Pertinent labs & imaging results that were available during my care of the patient were reviewed by me and considered in my medical decision making (see chart for details).  Clinical Course as of Jul 24 1399  Sat Jul 25, 2019  1358 The patient has evidence of infiltrate in the left lower lobe consistent with possible aspiration event.  The patient will be placed on to Augmentin   [BM]  1358 He is oxygenating at 95% on room air, no indication for admission to the hospital, otherwise no tachycardia, no fever, no hypotension and no tachypnea   [BM]    Clinical Course User Index [BM] Noemi Chapel, MD       EKG without acute findings - Chronic LBBB Chest with some ronchi - likely aspiration pneumonitis VS normal - sat's 96% on my exam. No edema, no other findigns Pt is not tachypneic Pt has hx of nasuea - has zofran and phenergan Rx at his facility.  Final Clinical Impressions(s) / ED Diagnoses   Final diagnoses:  Aspiration pneumonia of left lower lobe due to gastric secretions [facility])    ED Discharge Orders         Ordered    amoxicillin-clavulanate (AUGMENTIN) 875-125 MG tablet  Every 12 hours     07/25/19 1359           Noemi Chapel, MD 07/25/19 1400

## 2019-07-27 ENCOUNTER — Telehealth: Payer: Self-pay | Admitting: Internal Medicine

## 2019-07-27 DIAGNOSIS — R1312 Dysphagia, oropharyngeal phase: Secondary | ICD-10-CM | POA: Diagnosis not present

## 2019-07-27 DIAGNOSIS — R2681 Unsteadiness on feet: Secondary | ICD-10-CM | POA: Diagnosis not present

## 2019-07-27 NOTE — Telephone Encounter (Signed)
Received call on call on Saturday from [place].  Resident had an episode of aspiration 20-30 mins before.  He is normally alert and can answer questions, but he was quite lethargic afterward.  He had vomit on him and sats were down in the 60s.  He was suctioned, coughed some.  Then was nodding and giving appropriate answers, however, even then remained quite congested.  Sats still were running 81-85% with 5L O2 via mask in place.  BP 120s/80s and stable HR in 60s.  He was clammy.  When asked, nursing reported he had a MOST form that indicated he could be sent to the hospital in such scenarios.  They were calling family. Order given.    Marleena Shubert L. Maronda Caison, D.O. [facility] [address], [place] [address] Cell Phone (Mon-Fri 8am-5pm):  [phone] On Call:  [phone] & follow prompts after 5pm & weekends Office Phone:  [phone] Office Fax:  [phone]

## 2019-07-28 DIAGNOSIS — R1312 Dysphagia, oropharyngeal phase: Secondary | ICD-10-CM | POA: Diagnosis not present

## 2019-07-28 DIAGNOSIS — R2681 Unsteadiness on feet: Secondary | ICD-10-CM | POA: Diagnosis not present

## 2019-07-28 DIAGNOSIS — J069 Acute upper respiratory infection, unspecified: Secondary | ICD-10-CM | POA: Diagnosis not present

## 2019-07-29 DIAGNOSIS — R1312 Dysphagia, oropharyngeal phase: Secondary | ICD-10-CM | POA: Diagnosis not present

## 2019-07-29 DIAGNOSIS — R2681 Unsteadiness on feet: Secondary | ICD-10-CM | POA: Diagnosis not present

## 2019-07-30 DIAGNOSIS — R1312 Dysphagia, oropharyngeal phase: Secondary | ICD-10-CM | POA: Diagnosis not present

## 2019-07-30 DIAGNOSIS — R2681 Unsteadiness on feet: Secondary | ICD-10-CM | POA: Diagnosis not present

## 2019-07-31 DIAGNOSIS — R1312 Dysphagia, oropharyngeal phase: Secondary | ICD-10-CM | POA: Diagnosis not present

## 2019-07-31 DIAGNOSIS — R2681 Unsteadiness on feet: Secondary | ICD-10-CM | POA: Diagnosis not present

## 2019-08-03 DIAGNOSIS — R2681 Unsteadiness on feet: Secondary | ICD-10-CM | POA: Diagnosis not present

## 2019-08-03 DIAGNOSIS — J069 Acute upper respiratory infection, unspecified: Secondary | ICD-10-CM | POA: Diagnosis not present

## 2019-08-03 DIAGNOSIS — R1312 Dysphagia, oropharyngeal phase: Secondary | ICD-10-CM | POA: Diagnosis not present

## 2019-08-04 ENCOUNTER — Non-Acute Institutional Stay (SKILLED_NURSING_FACILITY): Payer: Medicare Other | Admitting: Internal Medicine

## 2019-08-04 ENCOUNTER — Encounter: Payer: Self-pay | Admitting: Internal Medicine

## 2019-08-04 DIAGNOSIS — I10 Essential (primary) hypertension: Secondary | ICD-10-CM | POA: Diagnosis not present

## 2019-08-04 DIAGNOSIS — Z789 Other specified health status: Secondary | ICD-10-CM | POA: Diagnosis not present

## 2019-08-04 DIAGNOSIS — Z298 Encounter for other specified prophylactic measures: Secondary | ICD-10-CM | POA: Diagnosis not present

## 2019-08-04 DIAGNOSIS — R2681 Unsteadiness on feet: Secondary | ICD-10-CM | POA: Diagnosis not present

## 2019-08-04 DIAGNOSIS — R1312 Dysphagia, oropharyngeal phase: Secondary | ICD-10-CM | POA: Diagnosis not present

## 2019-08-04 DIAGNOSIS — N183 Chronic kidney disease, stage 3 unspecified: Secondary | ICD-10-CM | POA: Diagnosis not present

## 2019-08-04 NOTE — Progress Notes (Signed)
Location:  [facility] Room Number: 215-D Place of Service:  SNF (31)  Volanda Napoleon, Langley Adie, MD  Patient Care Team: Billie Ruddy, MD as PCP - General [facility] Medicine)  Extended Emergency Contact Information Primary Emergency Contact: [facility] Address: [place], [place] [address] [name] of [place] Phone: [phone] Mobile Phone: [phone] Relation: Son    Allergies: Patient has no known allergies.  Chief Complaint  Patient presents with  . Acute Visit    Patient is seen for COVID prophylaxis    HPI: Patient is an 85 y.o. male who is being seen today for viral prophylaxis.  Everyone in the facility is being put on vitamin C, zinc, and vitamin D.  Past Medical History:  Diagnosis Date  . Hypertension   . Prostate cancer ([place])   . Syncope     Past Surgical History:  Procedure Laterality Date  . BACK SURGERY    . IR VERTEBROPLASTY CERV/THOR BX INC UNI/BIL INC/INJECT/IMAGING  01/31/2018  . IR VERTEBROPLASTY CERV/THOR BX INC UNI/BIL INC/INJECT/IMAGING  03/24/2019    Allergies as of 08/04/2019   No Known Allergies     Medication List       Accurate as of August 04, 2019  1:32 PM. If you have any questions, ask your nurse or doctor.        amLODipine 2.5 MG tablet Commonly known as: NORVASC Take 1 tablet (2.5 mg total) by mouth daily.   amoxicillin-clavulanate 875-125 MG tablet Commonly known as: AUGMENTIN Take 1 tablet by mouth every 12 (twelve) hours.   aspirin 81 MG chewable tablet Chew 1 tablet (81 mg total) by mouth daily.   carbidopa-levodopa 25-250 MG tablet Commonly known as: SINEMET IR Take 1 tablet by mouth 3 (three) times daily. 9am, 1pm, 7pm   cloNIDine 0.2 MG tablet Commonly known as: CATAPRES Take 0.2 mg by mouth 3 (three) times daily. 9am, 1pm, 7pm   feeding supplement (ENSURE ENLIVE) Liqd Take 237 mLs by mouth 2 (two) times daily between meals.    multivitamin with minerals Tabs tablet Take 1 tablet by mouth daily.   polyethylene glycol 17 g packet Commonly known as: MIRALAX / GLYCOLAX Take 17 g by mouth daily as needed for mild constipation.   QUEtiapine 50 MG tablet Commonly known as: SEROQUEL Take 50 mg by mouth daily at 6 PM.   ranibizumab 0.5 MG/0.05ML Soln Commonly known as: LUCENTIS 0.5 mg by Intravitreal route every 8 (eight) weeks. Performed in physician office   rosuvastatin 40 MG tablet Commonly known as: CRESTOR Take 1 tablet (40 mg total) by mouth daily at 6 PM.   thiamine 100 MG tablet Commonly known as: VITAMIN B-1 Take 100 mg by mouth See admin instructions. Take one tablet (100 mg) by mouth daily at 1pm       No orders of the defined types were placed in this encounter.   Immunization History  Administered Date(s) Administered  . Fluad Quad(high Dose 65+) 05/23/2019  . Influenza, High Dose Seasonal PF 06/26/2017, 06/17/2018  . Influenza-Unspecified 06/22/2016, 06/26/2017  . Pneumococcal Polysaccharide-23 01/07/2015  . Zoster Recombinat (Shingrix) 12/11/2017, 03/18/2018    Social History   Tobacco Use  . Smoking status: Former Smoker    Packs/day: 2.00    Years: 30.00    Pack years: 60.00    Types: Cigarettes    Quit date: 1990    Years since  quitting: 30.9  . Smokeless tobacco: Never Used  Substance Use Topics  . Alcohol use: Yes    Alcohol/week: 14.0 standard drinks    Types: 14 Cans of beer per week    Review of Systems    unable to obtain secondary to dementia; nursing-no acute concerns     Vitals:   08/04/19 1330  BP: 118/63  Pulse: 84  Resp: 20  Temp: (!) 96.9 F (36.1 C)  SpO2: 98%   Body mass index is 17.71 kg/m. Physical Exam  GENERAL APPEARANCE: Alert, conversant, No acute distress  SKIN: No diaphoresis rash HEENT: Unremarkable RESPIRATORY: Breathing is even, unlabored. Lung sounds are clear   CARDIOVASCULAR: Heart RRR no murmurs, rubs or gallops. No  peripheral edema  GASTROINTESTINAL: Abdomen is soft, non-tender, not distended w/ normal bowel sounds.  GENITOURINARY: Bladder non tender, not distended  MUSCULOSKELETAL: No abnormal joints or musculature NEUROLOGIC: Cranial nerves 2-12 grossly intact. Moves all extremities PSYCHIATRIC: Dementia, no behavioral issues  Patient Active Problem List   Diagnosis Date Noted  . Dementia with behavioral disturbance ([place]) 05/30/2019  . Hyperlipidemia 05/30/2019  . Bradycardia 05/22/2019  . Goals of care, counseling/discussion   . Palliative care by specialist   . Uncontrolled hypertension 05/21/2019  . Normocytic anemia 05/21/2019  . CKD (chronic kidney disease), stage III 05/21/2019  . Physical deconditioning 05/21/2019  . Hematuria 05/21/2019  . UTI (urinary tract infection) 05/20/2019  . Hypokalemia 04/20/2019  . Hypomagnesemia 04/20/2019  . Parkinson disease ([place]) 04/20/2019  . Cardiac arrhythmia 04/20/2019  . Acute renal failure superimposed on stage 3 chronic kidney disease ([place]) 04/20/2019  . Protein-calorie malnutrition, severe 03/31/2019  . Cerebral embolism with cerebral infarction 03/30/2019  . POTS (postural orthostatic tachycardia syndrome) 03/28/2019  . HTN (hypertension) 02/04/2018  . Prostate cancer ([place]) 02/04/2018  . Orthostatic hypotension 02/04/2018  . Iron deficiency anemia 02/04/2018  . Syncope 02/02/2018    CMP     Component Value Date/Time   NA 138 06/01/2019 1957   K 4.2 06/01/2019 1957   CL 104 06/01/2019 1957   CO2 24 06/01/2019 1957   GLUCOSE 107 (H) 06/01/2019 1957   BUN 37 (H) 06/01/2019 1957   CREATININE 1.61 (H) 06/01/2019 1957   CALCIUM 8.7 (L) 06/01/2019 1957   PROT 5.6 (L) 05/20/2019 1140   ALBUMIN 2.9 (L) 05/20/2019 1140   AST 10 (L) 05/20/2019 1140   ALT 5 05/20/2019 1140   ALKPHOS 56 05/20/2019 1140   BILITOT 0.3 05/20/2019 1140   GFRNONAA 38 (L) 06/01/2019 1957   GFRAA 44 (L) 06/01/2019 1957   Recent Labs    04/22/19 0441 04/23/19  0428  05/20/19 1900  05/23/19 0347 05/24/19 0513 06/01/19 1957  NA 142 143   < >  --    < > 137 138 138  K 3.0* 4.0   < >  --    < > 4.0 4.0 4.2  CL 104 110   < >  --    < > 104 105 104  CO2 29 26   < >  --    < > 25 25 24   GLUCOSE 104* 102*   < >  --    < > 95 100* 107*  BUN 28* 28*   < >  --    < > 28* 30* 37*  CREATININE 1.84* 1.73*   < >  --    < > 1.51* 1.51* 1.61*  CALCIUM 7.9* 7.9*   < >  --    < >  8.8* 8.6* 8.7*  MG 1.7 1.7  --  1.8  --   --   --   --    < > = values in this interval not displayed.   Recent Labs    03/30/19 0432 04/20/19 1701 05/20/19 1140  AST 16 35 10*  ALT 5 17 5   ALKPHOS 68 80 56  BILITOT 0.5 0.5 0.3  PROT 5.5* 6.3* 5.6*  ALBUMIN 3.2* 3.3* 2.9*   Recent Labs    04/23/19 0428 05/20/19 1140  05/23/19 0347 05/24/19 0513 06/01/19 1957  WBC 6.5 7.9   < > 5.2 5.9 6.9  NEUTROABS 4.4 6.3  --   --   --  4.4  HGB 7.6* 7.5*   < > 8.5* 8.8* 9.6*  HCT 23.3* 24.1*   < > 26.6* 27.1* 29.8*  MCV 93.2 99.6   < > 95.3 95.4 97.7  PLT 136* 170   < > 170 170 175   < > = values in this interval not displayed.   Recent Labs    03/30/19 0432  CHOL 207*  LDLCALC 131*  TRIG 70   No results found for: [facility] Lab Results  Component Value Date   TSH 1.798 05/21/2019   Lab Results  Component Value Date   HGBA1C 5.4 03/30/2019   Lab Results  Component Value Date   CHOL 207 (H) 03/30/2019   HDL 62 03/30/2019   LDLCALC 131 (H) 03/30/2019   TRIG 70 03/30/2019   CHOLHDL 3.3 03/30/2019    Significant Diagnostic Results in last 30 days:  Dg Chest Port 1 View  Result Date: 07/25/2019 CLINICAL DATA:  Cough. Vomiting. Clinical concern for aspiration. EXAM: PORTABLE CHEST 1 VIEW COMPARISON:  06/01/2019 FINDINGS: The cardiac silhouette remains borderline enlarged. The aorta remains tortuous and calcified. Interval minimal patchy opacity in the left lower lobe. Otherwise, clear lungs. The lungs remain hyperexpanded. Old, healed right rib fractures.  Diffuse osteopenia. Lower thoracic vertebral compression deformity and kyphoplasty material, unchanged. IMPRESSION: 1. Interval minimal patchy atelectasis, pneumonia or aspiration pneumonitis in the left lower lobe. 2. Stable changes of COPD. 3. Aortic atherosclerosis. Electronically Signed   By: Claudie Revering M.D.   On: 07/25/2019 13:53    Assessment and Plan  Hypertension/CKD 3/need for immunotherapy prophylaxis/no immunity to COVID-19-pharmacy was consulted.  Patient has been written for vitamin C5 100 mg daily, zinc sulfate 220 mg daily, and vitamin D 50,000 units monthly.  Vitamin D levels are being drawn today and will be drawn again in 90 days.    Hennie Duos, MD

## 2019-08-05 DIAGNOSIS — R1312 Dysphagia, oropharyngeal phase: Secondary | ICD-10-CM | POA: Diagnosis not present

## 2019-08-05 DIAGNOSIS — R2681 Unsteadiness on feet: Secondary | ICD-10-CM | POA: Diagnosis not present

## 2019-08-05 DIAGNOSIS — E559 Vitamin D deficiency, unspecified: Secondary | ICD-10-CM | POA: Diagnosis not present

## 2019-08-06 DIAGNOSIS — R2681 Unsteadiness on feet: Secondary | ICD-10-CM | POA: Diagnosis not present

## 2019-08-06 DIAGNOSIS — R1312 Dysphagia, oropharyngeal phase: Secondary | ICD-10-CM | POA: Diagnosis not present

## 2019-08-07 DIAGNOSIS — R1312 Dysphagia, oropharyngeal phase: Secondary | ICD-10-CM | POA: Diagnosis not present

## 2019-08-07 DIAGNOSIS — R2681 Unsteadiness on feet: Secondary | ICD-10-CM | POA: Diagnosis not present

## 2019-08-08 ENCOUNTER — Encounter: Payer: Self-pay | Admitting: Internal Medicine

## 2019-08-08 DIAGNOSIS — R2681 Unsteadiness on feet: Secondary | ICD-10-CM | POA: Diagnosis not present

## 2019-08-08 DIAGNOSIS — R1312 Dysphagia, oropharyngeal phase: Secondary | ICD-10-CM | POA: Diagnosis not present

## 2019-08-09 DIAGNOSIS — R1312 Dysphagia, oropharyngeal phase: Secondary | ICD-10-CM | POA: Diagnosis not present

## 2019-08-09 DIAGNOSIS — R2681 Unsteadiness on feet: Secondary | ICD-10-CM | POA: Diagnosis not present

## 2019-08-10 ENCOUNTER — Non-Acute Institutional Stay (SKILLED_NURSING_FACILITY): Payer: Medicare Other | Admitting: Internal Medicine

## 2019-08-10 DIAGNOSIS — R1312 Dysphagia, oropharyngeal phase: Secondary | ICD-10-CM

## 2019-08-10 DIAGNOSIS — J398 Other specified diseases of upper respiratory tract: Secondary | ICD-10-CM

## 2019-08-10 DIAGNOSIS — R2681 Unsteadiness on feet: Secondary | ICD-10-CM | POA: Diagnosis not present

## 2019-08-11 DIAGNOSIS — R2681 Unsteadiness on feet: Secondary | ICD-10-CM | POA: Diagnosis not present

## 2019-08-11 DIAGNOSIS — R1312 Dysphagia, oropharyngeal phase: Secondary | ICD-10-CM | POA: Diagnosis not present

## 2019-08-11 DIAGNOSIS — D509 Iron deficiency anemia, unspecified: Secondary | ICD-10-CM | POA: Diagnosis not present

## 2019-08-11 DIAGNOSIS — I69328 Other speech and language deficits following cerebral infarction: Secondary | ICD-10-CM | POA: Diagnosis not present

## 2019-08-11 NOTE — Progress Notes (Signed)
Location:   [facility] of Service:   SNF Provider:Annr D Joseph Johns MD  Billie Ruddy, MD  Patient Care Team: Billie Ruddy, MD as PCP - General [facility])  Extended Emergency Contact Information Primary Emergency Contact: [facility] Address: [place], [place] [address] [name] of [place] Phone: [phone] Mobile Phone: [phone] Relation: Son    Allergies: Patient has no known allergies.  Chief Complaint  Patient presents with  . Acute Visit    HPI: Patient is 85 y.o. male who is aspirated b his eing seen at the behest of the speech therapist.  Judson Roch says that the patient has a thick clear mucus that is making his aspiration worse and that the food slides down into his trachea with the mucus.  She has seen this in the past.  Past Medical History:  Diagnosis Date  . Hypertension   . Prostate cancer ([place])   . Syncope     Past Surgical History:  Procedure Laterality Date  . BACK SURGERY    . IR VERTEBROPLASTY CERV/THOR BX INC UNI/BIL INC/INJECT/IMAGING  01/31/2018  . IR VERTEBROPLASTY CERV/THOR BX INC UNI/BIL INC/INJECT/IMAGING  03/24/2019    Allergies as of 08/10/2019   No Known Allergies     Medication List       Accurate as of August 10, 2019 11:59 PM. If you have any questions, ask your nurse or doctor.        amLODipine 2.5 MG tablet Commonly known as: NORVASC Take 1 tablet (2.5 mg total) by mouth daily.   amoxicillin-clavulanate 875-125 MG tablet Commonly known as: AUGMENTIN Take 1 tablet by mouth every 12 (twelve) hours.   aspirin 81 MG chewable tablet Chew 1 tablet (81 mg total) by mouth daily.   carbidopa-levodopa 25-250 MG tablet Commonly known as: SINEMET IR Take 1 tablet by mouth 3 (three) times daily. 9am, 1pm, 7pm   cloNIDine 0.2 MG tablet Commonly known as: CATAPRES Take 0.2 mg by mouth 3 (three) times daily. 9am, 1pm, 7pm   feeding supplement (ENSURE ENLIVE) Liqd  Take 237 mLs by mouth 2 (two) times daily between meals.   multivitamin with minerals Tabs tablet Take 1 tablet by mouth daily.   polyethylene glycol 17 g packet Commonly known as: MIRALAX / GLYCOLAX Take 17 g by mouth daily as needed for mild constipation.   QUEtiapine 50 MG tablet Commonly known as: SEROQUEL Take 50 mg by mouth daily at 6 PM.   ranibizumab 0.5 MG/0.05ML Soln Commonly known as: LUCENTIS 0.5 mg by Intravitreal route every 8 (eight) weeks. Performed in physician office   rosuvastatin 40 MG tablet Commonly known as: CRESTOR Take 1 tablet (40 mg total) by mouth daily at 6 PM.   thiamine 100 MG tablet Commonly known as: VITAMIN B-1 Take 100 mg by mouth See admin instructions. Take one tablet (100 mg) by mouth daily at 1pm       No orders of the defined types were placed in this encounter.   Immunization History  Administered Date(s) Administered  . Fluad Quad(high Dose 65+) 05/23/2019  . Influenza, High Dose Seasonal PF 06/26/2017, 06/17/2018  . Influenza-Unspecified 06/22/2016, 06/26/2017  . Pneumococcal Polysaccharide-23 01/07/2015  . Zoster Recombinat (Shingrix) 12/11/2017, 03/18/2018    Social History   Tobacco Use  . Smoking status: Former Smoker    Packs/day: 2.00    Years: 30.00    Pack years: 60.00  Types: Cigarettes    Quit date: 1990    Years since quitting: 30.9  . Smokeless tobacco: Never Used  Substance Use Topics  . Alcohol use: Yes    Alcohol/week: 14.0 standard drinks    Types: 14 Cans of beer per week    Review of Systems  GENERAL:  no fevers, fatigue, appetite changes SKIN: No itching, rash HEENT: No complaint RESPIRATORY: No cough, wheezing, SOB, mucus as per history of present illness CARDIAC: No chest pain, palpitations, lower extremity edema  GI: No abdominal pain, No N/V/D or constipation, No heartburn or reflux  GU: No dysuria, frequency or urgency, or incontinence  MUSCULOSKELETAL: No unrelieved bone/joint pain  NEUROLOGIC: No headache, dizziness  PSYCHIATRIC: No overt anxiety or sadness  Vitals:   08/11/19 1551  BP: 122/71  Pulse: 78  Resp: 17  Temp: (!) 97.2 F (36.2 C)   Body mass index is 18.2 kg/m. Physical Exam  GENERAL APPEARANCE: Alert, conversant, No acute distress  SKIN: No diaphoresis rash HEENT: Unremarkable RESPIRATORY: Breathing is even, unlabored. Lung sounds are clear   CARDIOVASCULAR: Heart RRR no murmurs, rubs or gallops. No peripheral edema  GASTROINTESTINAL: Abdomen is soft, non-tender, not distended w/ normal bowel sounds.  GENITOURINARY: Bladder non tender, not distended  MUSCULOSKELETAL: No abnormal joints or musculature NEUROLOGIC: Cranial nerves 2-12 grossly intact. Moves all extremities PSYCHIATRIC: Mood and affect appropriate to situation, with some dementia, no behavioral issues  Patient Active Problem List   Diagnosis Date Noted  . Dementia with behavioral disturbance ([place]) 05/30/2019  . Hyperlipidemia 05/30/2019  . Bradycardia 05/22/2019  . Goals of care, counseling/discussion   . Palliative care by specialist   . Uncontrolled hypertension 05/21/2019  . Normocytic anemia 05/21/2019  . CKD (chronic kidney disease), stage III 05/21/2019  . Physical deconditioning 05/21/2019  . Hematuria 05/21/2019  . UTI (urinary tract infection) 05/20/2019  . Hypokalemia 04/20/2019  . Hypomagnesemia 04/20/2019  . Parkinson disease ([place]) 04/20/2019  . Cardiac arrhythmia 04/20/2019  . Acute renal failure superimposed on stage 3 chronic kidney disease ([place]) 04/20/2019  . Protein-calorie malnutrition, severe 03/31/2019  . Cerebral embolism with cerebral infarction 03/30/2019  . POTS (postural orthostatic tachycardia syndrome) 03/28/2019  . HTN (hypertension) 02/04/2018  . Prostate cancer ([place]) 02/04/2018  . Orthostatic hypotension 02/04/2018  . Iron deficiency anemia 02/04/2018  . Syncope 02/02/2018    CMP     Component Value Date/Time   NA 138 06/01/2019  1957   K 4.2 06/01/2019 1957   CL 104 06/01/2019 1957   CO2 24 06/01/2019 1957   GLUCOSE 107 (H) 06/01/2019 1957   BUN 37 (H) 06/01/2019 1957   CREATININE 1.61 (H) 06/01/2019 1957   CALCIUM 8.7 (L) 06/01/2019 1957   PROT 5.6 (L) 05/20/2019 1140   ALBUMIN 2.9 (L) 05/20/2019 1140   AST 10 (L) 05/20/2019 1140   ALT 5 05/20/2019 1140   ALKPHOS 56 05/20/2019 1140   BILITOT 0.3 05/20/2019 1140   GFRNONAA 38 (L) 06/01/2019 1957   GFRAA 44 (L) 06/01/2019 1957   Recent Labs    04/22/19 0441 04/23/19 0428  05/20/19 1900  05/23/19 0347 05/24/19 0513 06/01/19 1957  NA 142 143   < >  --    < > 137 138 138  K 3.0* 4.0   < >  --    < > 4.0 4.0 4.2  CL 104 110   < >  --    < > 104 105 104  CO2 29 26   < >  --    < >  25 25 24   GLUCOSE 104* 102*   < >  --    < > 95 100* 107*  BUN 28* 28*   < >  --    < > 28* 30* 37*  CREATININE 1.84* 1.73*   < >  --    < > 1.51* 1.51* 1.61*  CALCIUM 7.9* 7.9*   < >  --    < > 8.8* 8.6* 8.7*  MG 1.7 1.7  --  1.8  --   --   --   --    < > = values in this interval not displayed.   Recent Labs    03/30/19 0432 04/20/19 1701 05/20/19 1140  AST 16 35 10*  ALT 5 17 5   ALKPHOS 68 80 56  BILITOT 0.5 0.5 0.3  PROT 5.5* 6.3* 5.6*  ALBUMIN 3.2* 3.3* 2.9*   Recent Labs    04/23/19 0428 05/20/19 1140  05/23/19 0347 05/24/19 0513 06/01/19 1957  WBC 6.5 7.9   < > 5.2 5.9 6.9  NEUTROABS 4.4 6.3  --   --   --  4.4  HGB 7.6* 7.5*   < > 8.5* 8.8* 9.6*  HCT 23.3* 24.1*   < > 26.6* 27.1* 29.8*  MCV 93.2 99.6   < > 95.3 95.4 97.7  PLT 136* 170   < > 170 170 175   < > = values in this interval not displayed.   Recent Labs    03/30/19 0432  CHOL 207*  LDLCALC 131*  TRIG 70   No results found for: [facility] Lab Results  Component Value Date   TSH 1.798 05/21/2019   Lab Results  Component Value Date   HGBA1C 5.4 03/30/2019   Lab Results  Component Value Date   CHOL 207 (H) 03/30/2019   HDL 62 03/30/2019   LDLCALC 131 (H) 03/30/2019   TRIG 70  03/30/2019   CHOLHDL 3.3 03/30/2019    Significant Diagnostic Results in last 30 days:  Dg Chest Port 1 View  Result Date: 07/25/2019 CLINICAL DATA:  Cough. Vomiting. Clinical concern for aspiration. EXAM: PORTABLE CHEST 1 VIEW COMPARISON:  06/01/2019 FINDINGS: The cardiac silhouette remains borderline enlarged. The aorta remains tortuous and calcified. Interval minimal patchy opacity in the left lower lobe. Otherwise, clear lungs. The lungs remain hyperexpanded. Old, healed right rib fractures. Diffuse osteopenia. Lower thoracic vertebral compression deformity and kyphoplasty material, unchanged. IMPRESSION: 1. Interval minimal patchy atelectasis, pneumonia or aspiration pneumonitis in the left lower lobe. 2. Stable changes of COPD. 3. Aortic atherosclerosis. Electronically Signed   By: Claudie Revering M.D.   On: 07/25/2019 13:53    Assessment and Plan  Dysphagia/excess Arnoldo Lenis says she seen this in the past when there is some sort of esophageal reaction; of course is impossible to get patient to GI at this stage the pandemic but will try; in the meantime were going to try some guaifenesin 600 mg twice daily to see if we can thin out the secretions; will monitor response  No problem-specific Assessment & Plan notes found for this encounter.   Labs/tests ordered:    Hennie Duos, MD

## 2019-08-12 DIAGNOSIS — R1312 Dysphagia, oropharyngeal phase: Secondary | ICD-10-CM | POA: Diagnosis not present

## 2019-08-12 DIAGNOSIS — I69328 Other speech and language deficits following cerebral infarction: Secondary | ICD-10-CM | POA: Diagnosis not present

## 2019-08-12 DIAGNOSIS — D509 Iron deficiency anemia, unspecified: Secondary | ICD-10-CM | POA: Diagnosis not present

## 2019-08-12 DIAGNOSIS — R2681 Unsteadiness on feet: Secondary | ICD-10-CM | POA: Diagnosis not present

## 2019-08-13 DIAGNOSIS — R1312 Dysphagia, oropharyngeal phase: Secondary | ICD-10-CM | POA: Diagnosis not present

## 2019-08-13 DIAGNOSIS — D509 Iron deficiency anemia, unspecified: Secondary | ICD-10-CM | POA: Diagnosis not present

## 2019-08-13 DIAGNOSIS — R2681 Unsteadiness on feet: Secondary | ICD-10-CM | POA: Diagnosis not present

## 2019-08-13 DIAGNOSIS — I69328 Other speech and language deficits following cerebral infarction: Secondary | ICD-10-CM | POA: Diagnosis not present

## 2019-08-16 ENCOUNTER — Encounter: Payer: Self-pay | Admitting: Internal Medicine

## 2019-08-17 DIAGNOSIS — I69328 Other speech and language deficits following cerebral infarction: Secondary | ICD-10-CM | POA: Diagnosis not present

## 2019-08-17 DIAGNOSIS — R2681 Unsteadiness on feet: Secondary | ICD-10-CM | POA: Diagnosis not present

## 2019-08-17 DIAGNOSIS — R1312 Dysphagia, oropharyngeal phase: Secondary | ICD-10-CM | POA: Diagnosis not present

## 2019-08-17 DIAGNOSIS — D509 Iron deficiency anemia, unspecified: Secondary | ICD-10-CM | POA: Diagnosis not present

## 2019-08-18 DIAGNOSIS — H43393 Other vitreous opacities, bilateral: Secondary | ICD-10-CM | POA: Diagnosis not present

## 2019-08-18 DIAGNOSIS — H43813 Vitreous degeneration, bilateral: Secondary | ICD-10-CM | POA: Diagnosis not present

## 2019-08-18 DIAGNOSIS — R1312 Dysphagia, oropharyngeal phase: Secondary | ICD-10-CM | POA: Diagnosis not present

## 2019-08-18 DIAGNOSIS — R2681 Unsteadiness on feet: Secondary | ICD-10-CM | POA: Diagnosis not present

## 2019-08-18 DIAGNOSIS — I69328 Other speech and language deficits following cerebral infarction: Secondary | ICD-10-CM | POA: Diagnosis not present

## 2019-08-18 DIAGNOSIS — D509 Iron deficiency anemia, unspecified: Secondary | ICD-10-CM | POA: Diagnosis not present

## 2019-08-18 DIAGNOSIS — H353231 Exudative age-related macular degeneration, bilateral, with active choroidal neovascularization: Secondary | ICD-10-CM | POA: Diagnosis not present

## 2019-08-19 DIAGNOSIS — R2681 Unsteadiness on feet: Secondary | ICD-10-CM | POA: Diagnosis not present

## 2019-08-19 DIAGNOSIS — R1312 Dysphagia, oropharyngeal phase: Secondary | ICD-10-CM | POA: Diagnosis not present

## 2019-08-19 DIAGNOSIS — I69328 Other speech and language deficits following cerebral infarction: Secondary | ICD-10-CM | POA: Diagnosis not present

## 2019-08-19 DIAGNOSIS — D509 Iron deficiency anemia, unspecified: Secondary | ICD-10-CM | POA: Diagnosis not present

## 2019-08-20 DIAGNOSIS — R1312 Dysphagia, oropharyngeal phase: Secondary | ICD-10-CM | POA: Diagnosis not present

## 2019-08-20 DIAGNOSIS — D509 Iron deficiency anemia, unspecified: Secondary | ICD-10-CM | POA: Diagnosis not present

## 2019-08-20 DIAGNOSIS — I69328 Other speech and language deficits following cerebral infarction: Secondary | ICD-10-CM | POA: Diagnosis not present

## 2019-08-20 DIAGNOSIS — R2681 Unsteadiness on feet: Secondary | ICD-10-CM | POA: Diagnosis not present

## 2019-08-21 DIAGNOSIS — I69328 Other speech and language deficits following cerebral infarction: Secondary | ICD-10-CM | POA: Diagnosis not present

## 2019-08-21 DIAGNOSIS — D509 Iron deficiency anemia, unspecified: Secondary | ICD-10-CM | POA: Diagnosis not present

## 2019-08-21 DIAGNOSIS — R2681 Unsteadiness on feet: Secondary | ICD-10-CM | POA: Diagnosis not present

## 2019-08-21 DIAGNOSIS — K219 Gastro-esophageal reflux disease without esophagitis: Secondary | ICD-10-CM | POA: Diagnosis not present

## 2019-08-21 DIAGNOSIS — R1314 Dysphagia, pharyngoesophageal phase: Secondary | ICD-10-CM | POA: Diagnosis not present

## 2019-08-21 DIAGNOSIS — R1312 Dysphagia, oropharyngeal phase: Secondary | ICD-10-CM | POA: Diagnosis not present

## 2019-08-21 DIAGNOSIS — G20 Parkinson's disease: Secondary | ICD-10-CM | POA: Diagnosis not present

## 2019-08-24 DIAGNOSIS — R1312 Dysphagia, oropharyngeal phase: Secondary | ICD-10-CM | POA: Diagnosis not present

## 2019-08-24 DIAGNOSIS — I69328 Other speech and language deficits following cerebral infarction: Secondary | ICD-10-CM | POA: Diagnosis not present

## 2019-08-24 DIAGNOSIS — R2681 Unsteadiness on feet: Secondary | ICD-10-CM | POA: Diagnosis not present

## 2019-08-24 DIAGNOSIS — D509 Iron deficiency anemia, unspecified: Secondary | ICD-10-CM | POA: Diagnosis not present

## 2019-08-25 DIAGNOSIS — D509 Iron deficiency anemia, unspecified: Secondary | ICD-10-CM | POA: Diagnosis not present

## 2019-08-25 DIAGNOSIS — R2681 Unsteadiness on feet: Secondary | ICD-10-CM | POA: Diagnosis not present

## 2019-08-25 DIAGNOSIS — I69328 Other speech and language deficits following cerebral infarction: Secondary | ICD-10-CM | POA: Diagnosis not present

## 2019-08-25 DIAGNOSIS — R1312 Dysphagia, oropharyngeal phase: Secondary | ICD-10-CM | POA: Diagnosis not present

## 2019-08-26 DIAGNOSIS — I69328 Other speech and language deficits following cerebral infarction: Secondary | ICD-10-CM | POA: Diagnosis not present

## 2019-08-26 DIAGNOSIS — R2681 Unsteadiness on feet: Secondary | ICD-10-CM | POA: Diagnosis not present

## 2019-08-26 DIAGNOSIS — D509 Iron deficiency anemia, unspecified: Secondary | ICD-10-CM | POA: Diagnosis not present

## 2019-08-26 DIAGNOSIS — R1312 Dysphagia, oropharyngeal phase: Secondary | ICD-10-CM | POA: Diagnosis not present

## 2019-08-27 DIAGNOSIS — R1312 Dysphagia, oropharyngeal phase: Secondary | ICD-10-CM | POA: Diagnosis not present

## 2019-08-27 DIAGNOSIS — R2681 Unsteadiness on feet: Secondary | ICD-10-CM | POA: Diagnosis not present

## 2019-08-27 DIAGNOSIS — D509 Iron deficiency anemia, unspecified: Secondary | ICD-10-CM | POA: Diagnosis not present

## 2019-08-27 DIAGNOSIS — I69328 Other speech and language deficits following cerebral infarction: Secondary | ICD-10-CM | POA: Diagnosis not present

## 2019-08-28 DIAGNOSIS — R2681 Unsteadiness on feet: Secondary | ICD-10-CM | POA: Diagnosis not present

## 2019-08-28 DIAGNOSIS — R1312 Dysphagia, oropharyngeal phase: Secondary | ICD-10-CM | POA: Diagnosis not present

## 2019-08-28 DIAGNOSIS — I69328 Other speech and language deficits following cerebral infarction: Secondary | ICD-10-CM | POA: Diagnosis not present

## 2019-08-28 DIAGNOSIS — D509 Iron deficiency anemia, unspecified: Secondary | ICD-10-CM | POA: Diagnosis not present

## 2019-08-31 ENCOUNTER — Non-Acute Institutional Stay (SKILLED_NURSING_FACILITY): Payer: Medicare Other | Admitting: Internal Medicine

## 2019-08-31 ENCOUNTER — Encounter: Payer: Self-pay | Admitting: Internal Medicine

## 2019-08-31 DIAGNOSIS — F02818 Dementia in other diseases classified elsewhere, unspecified severity, with other behavioral disturbance: Secondary | ICD-10-CM

## 2019-08-31 DIAGNOSIS — G20 Parkinson's disease: Secondary | ICD-10-CM

## 2019-08-31 DIAGNOSIS — N183 Chronic kidney disease, stage 3 unspecified: Secondary | ICD-10-CM | POA: Diagnosis not present

## 2019-08-31 DIAGNOSIS — I10 Essential (primary) hypertension: Secondary | ICD-10-CM | POA: Diagnosis not present

## 2019-08-31 DIAGNOSIS — C61 Malignant neoplasm of prostate: Secondary | ICD-10-CM

## 2019-08-31 DIAGNOSIS — D649 Anemia, unspecified: Secondary | ICD-10-CM | POA: Diagnosis not present

## 2019-08-31 DIAGNOSIS — F0281 Dementia in other diseases classified elsewhere with behavioral disturbance: Secondary | ICD-10-CM

## 2019-08-31 NOTE — Progress Notes (Signed)
Location:  [facility] Room Number: 215-D Place of Service:  SNF (31) Provider:  [place]anville Lewis, PA-C  Patient Care Team: Billie Ruddy, MD as PCP - General (Family Medicine)  Extended Emergency Contact Information Primary Emergency Contact: Orpah Melter Address: [place], [place] [address] [name] Phone: [phone] Mobile Phone: [phone] Relation: Son  Code Status:  DNR Goals of care: Advanced Directive information Advanced Directives 08/31/2019  Does Patient Have a Medical Advance Directive? Yes  Type of Advance Directive Out of facility DNR (pink MOST or yellow form);Healthcare Power of Attorney  Does patient want to make changes to medical advance directive? No - Patient declined  Copy of [place] in Chart? Yes - validated most recent copy scanned in chart (See row information)  Would patient like information on creating a medical advance directive? -  Pre-existing out of facility DNR order (yellow form or pink MOST form) Yellow form placed in chart (order not valid for inpatient use)     Chief Complaint  Patient presents with  . Medical Management of Chronic Issues    Routine [facility] visit  Medical management of chronic medical conditions including Parkinson's disease-hypertension-chronic kidney disease as well as dementia anemia and history of prostate cancer  HPI:  Pt is an 85 y.o. male seen today for medical management of chronic diseases.  As noted above.  Nursing does not report any current acute issues.  He was seen recently and started on Mucinex secondary to some thick secretions which patient apparently was swallowing-this was in hopes of sending those.  At some point is thought it would be best to see GI but with pandemic at this point this is quite challenging  Patient is sitting on the side of his bed he appears to be comfortable and does not have  any complaints-he is somewhat of a poor historian however.  Vital signs appear to be stable     He does have significant Parkinson's disease with dementia and continues on Sinemet.  He also has a history of hypertension is on clonidine 0.2 mg twice daily and Norvasc 2.5 mg a day blood pressures appear to be satisfactory with systolics ranging from the 120s to 140s recently.  He does have a significant history of anemia at one point hemoglobin was 7.1 he did get a transfusion of 1 unit of packed red blood cells most recent hemoglobin showed significant improvement at 10.1 but this will need to be rechecked.  He also has a history of aspiration pneumonia and went to the emergency department and did receive Augmentin which is completed he continues to be an aspiration risk.  In regards to prostate cancer with radiation at this point family does not desire any aggressive including follow-up with urology.     Past Medical History:  Diagnosis Date  . Hypertension   . Prostate cancer ([place])   . Syncope    Past Surgical History:  Procedure Laterality Date  . BACK SURGERY    . IR VERTEBROPLASTY CERV/THOR BX INC UNI/BIL INC/INJECT/IMAGING  01/31/2018  . IR VERTEBROPLASTY CERV/THOR BX INC UNI/BIL INC/INJECT/IMAGING  03/24/2019    No Known Allergies  Outpatient Encounter Medications as of 08/31/2019  Medication Sig  . amLODipine (NORVASC) 2.5 MG tablet Take 1 tablet (2.5 mg total) by mouth daily.  Marland Kitchen ascorbic acid (VITAMIN C) 500 MG tablet Take 500  mg by mouth daily.  Marland Kitchen aspirin 81 MG chewable tablet Chew 1 tablet (81 mg total) by mouth daily.  . carbidopa-levodopa (SINEMET IR) 25-250 MG tablet Take 1 tablet by mouth 3 (three) times daily. 9am, 1pm, 7pm  . Cholecalciferol (VITAMIN D3) 1.25 MG (50000 UT) CAPS Take 1 capsule by mouth every 30 (thirty) days.  . cloNIDine (CATAPRES) 0.2 MG tablet Take 0.2 mg by mouth 3 (three) times daily. 9am, 1pm, 7pm  . feeding supplement, ENSURE ENLIVE,  (ENSURE ENLIVE) LIQD Take 237 mLs by mouth 2 (two) times daily between meals.  Marland Kitchen guaiFENesin (MUCINEX) 600 MG 12 hr tablet Take 600 mg by mouth 2 (two) times daily.  . Multiple Vitamin (MULTIVITAMIN WITH MINERALS) TABS tablet Take 1 tablet by mouth daily.  Marland Kitchen omeprazole (PRILOSEC) 40 MG capsule Take 40 mg by mouth daily.  . polyethylene glycol (MIRALAX / GLYCOLAX) 17 g packet Take 17 g by mouth daily as needed for mild constipation.  . promethazine (PHENERGAN) 25 MG tablet Take 25 mg by mouth every 6 (six) hours as needed for nausea or vomiting.  Marland Kitchen QUEtiapine (SEROQUEL) 50 MG tablet Take 50 mg by mouth daily at 6 PM.  . ranibizumab (LUCENTIS) 0.5 MG/0.05ML SOLN 0.5 mg by Intravitreal route every 8 (eight) weeks. Performed in physician office  . rosuvastatin (CRESTOR) 40 MG tablet Take 1 tablet (40 mg total) by mouth daily at 6 PM.  . thiamine (VITAMIN B-1) 100 MG tablet Take 100 mg by mouth daily. Take at 1 PM  . zinc sulfate 220 (50 Zn) MG capsule Take 220 mg by mouth every other day.  . [DISCONTINUED] amoxicillin-clavulanate (AUGMENTIN) 875-125 MG tablet Take 1 tablet by mouth every 12 (twelve) hours.   No facility-administered encounter medications on file as of 08/31/2019.    Review of Systems   This is limited secondary to dementia but patient does not have any complaints of pain or shortness of breath today-nursing is not reported any recent issues    Immunization History  Administered Date(s) Administered  . Fluad Quad(high Dose 65+) 05/23/2019  . Influenza, High Dose Seasonal PF 06/26/2017, 06/17/2018  . Influenza-Unspecified 06/22/2016, 06/26/2017  . Pneumococcal Polysaccharide-23 01/07/2015  . Zoster Recombinat (Shingrix) 12/11/2017, 03/18/2018   Pertinent  Health Maintenance Due  Topic Date Due  . PNA vac Low Risk Adult (2 of 2 - PCV13) 01/07/2016  . INFLUENZA VACCINE  Completed   No flowsheet data found. Functional Status Survey:    Vitals:   08/31/19 0851  BP: (!)  141/66  Pulse: 73  Resp: 20  Temp: (!) 97 F (36.1 C)  TempSrc: Oral  SpO2: 98%  Weight: 133 lb 9.6 oz (60.6 kg)  Height: 6\' 1"  (1.854 m)   Body mass index is 17.63 kg/m. Physical Exam   In general this is a pleasant somewhat frail elderly male in no distress.  His skin is warm and dry he does have numerous scaling and solar induced changes at baseline.  Eyes visual acuity appears to be grossly intact sclera and conjunctive are clear.  Oropharynx appears to be clear he did not open his mouth very wide however he does have numerous extractions.  Chest is clear to auscultation with shallow air entry there is no labored breathing.  Heart is regular rate and rhythm has quite distant heart sounds.  Abdomen is soft nontender with positive bowel sounds.  Musculoskeletal does have general frailty but appears able to move his extremities at baseline with weakness diffuse.  Neurologic  appears grossly intact I could not really appreciate a tremor today cranial nerves appear to be intact  Psych he is pleasant and appropriate is somewhat more talkative and responsive than I have seen in the past.    Labs reviewed:  August 05, 2019 vitamin D level was 25.  June 26, 2019.  WBC 7.1 hemoglobin 10.1 platelets 136.  Sodium 143 potassium 4.4 BUN 32.4 creatinine 1.42.  Hemoglobin A1c was 4.9.   Recent Labs    04/22/19 0441 04/23/19 0428 05/20/19 1900 05/23/19 0347 05/24/19 0513 06/01/19 1957  NA 142 143  --  137 138 138  K 3.0* 4.0  --  4.0 4.0 4.2  CL 104 110  --  104 105 104  CO2 29 26  --  25 25 24   GLUCOSE 104* 102*  --  95 100* 107*  BUN 28* 28*  --  28* 30* 37*  CREATININE 1.84* 1.73*  --  1.51* 1.51* 1.61*  CALCIUM 7.9* 7.9*  --  8.8* 8.6* 8.7*  MG 1.7 1.7 1.8  --   --   --    Recent Labs    03/30/19 0432 04/20/19 1701 05/20/19 1140  AST 16 35 10*  ALT 5 17 5   ALKPHOS 68 80 56  BILITOT 0.5 0.5 0.3  PROT 5.5* 6.3* 5.6*  ALBUMIN 3.2* 3.3* 2.9*    Recent Labs    04/23/19 0428 05/20/19 1140 05/23/19 0347 05/24/19 0513 06/01/19 1957  WBC 6.5 7.9 5.2 5.9 6.9  NEUTROABS 4.4 6.3  --   --  4.4  HGB 7.6* 7.5* 8.5* 8.8* 9.6*  HCT 23.3* 24.1* 26.6* 27.1* 29.8*  MCV 93.2 99.6 95.3 95.4 97.7  PLT 136* 170 170 170 175   Lab Results  Component Value Date   TSH 1.798 05/21/2019   Lab Results  Component Value Date   HGBA1C 5.4 03/30/2019   Lab Results  Component Value Date   CHOL 207 (H) 03/30/2019   HDL 62 03/30/2019   LDLCALC 131 (H) 03/30/2019   TRIG 70 03/30/2019   CHOLHDL 3.3 03/30/2019    Significant Diagnostic Results in last 30 days:  No results found.  Assessment/Plan  #1 history of Parkinson's disease with dementia he continues on Sinemet-conservative care is desired-at this point continue supportive care.  For associated dementia again emphasis is on comfort he is currently not on any medication except for Seroquel at at bedtime.  2.  Hypertension at this point appears to be stable systolics recently have ranged mainly from the 120s to 140s-the lowest documented I see is 101/59-he is on clonidine 0.2 mg twice daily and low-dose Norvasc 2.5 mg a day at this point will monitor.  3.  History of anemia this has shown improvement however this will need to be rechecked he does have a history of transfusion in the past.  4.  History of aspiration pneumonia this appears to have stabilized although he remains an aspiration risk he has been started on Mucinex with hope of thinning his secretions-at some point would most likely benefit from a GI consult which is been difficult during the pandemic to arrange.  5.  History of chronic kidney disease this appears to be stable most recent creatinine was 1.42 which is relatively baseline will update this.  6.  History prostate cancer with radiation again family desires no aggressive work-up or urology follow-up at this point will monitor.  [id number]

## 2019-09-01 ENCOUNTER — Other Ambulatory Visit: Payer: Self-pay

## 2019-09-01 ENCOUNTER — Observation Stay (HOSPITAL_COMMUNITY)
Admission: EM | Admit: 2019-09-01 | Discharge: 2019-09-03 | Disposition: A | Discharge status: Rehabilitation Facility | Payer: Medicare Other | Attending: Internal Medicine | Admitting: Internal Medicine

## 2019-09-01 ENCOUNTER — Encounter (HOSPITAL_COMMUNITY): Payer: Self-pay | Admitting: Emergency Medicine

## 2019-09-01 DIAGNOSIS — Z20828 Contact with and (suspected) exposure to other viral communicable diseases: Secondary | ICD-10-CM | POA: Insufficient documentation

## 2019-09-01 DIAGNOSIS — G309 Alzheimer's disease, unspecified: Secondary | ICD-10-CM | POA: Diagnosis not present

## 2019-09-01 DIAGNOSIS — I129 Hypertensive chronic kidney disease with stage 1 through stage 4 chronic kidney disease, or unspecified chronic kidney disease: Secondary | ICD-10-CM | POA: Insufficient documentation

## 2019-09-01 DIAGNOSIS — Z8546 Personal history of malignant neoplasm of prostate: Secondary | ICD-10-CM | POA: Diagnosis not present

## 2019-09-01 DIAGNOSIS — Z743 Need for continuous supervision: Secondary | ICD-10-CM | POA: Diagnosis not present

## 2019-09-01 DIAGNOSIS — Z87891 Personal history of nicotine dependence: Secondary | ICD-10-CM | POA: Diagnosis not present

## 2019-09-01 DIAGNOSIS — D649 Anemia, unspecified: Secondary | ICD-10-CM | POA: Diagnosis not present

## 2019-09-01 DIAGNOSIS — F0280 Dementia in other diseases classified elsewhere without behavioral disturbance: Secondary | ICD-10-CM | POA: Diagnosis not present

## 2019-09-01 DIAGNOSIS — I10 Essential (primary) hypertension: Secondary | ICD-10-CM | POA: Diagnosis not present

## 2019-09-01 DIAGNOSIS — D62 Acute posthemorrhagic anemia: Principal | ICD-10-CM | POA: Insufficient documentation

## 2019-09-01 DIAGNOSIS — Z79899 Other long term (current) drug therapy: Secondary | ICD-10-CM | POA: Diagnosis not present

## 2019-09-01 DIAGNOSIS — Z03818 Encounter for observation for suspected exposure to other biological agents ruled out: Secondary | ICD-10-CM | POA: Diagnosis not present

## 2019-09-01 DIAGNOSIS — Z7982 Long term (current) use of aspirin: Secondary | ICD-10-CM | POA: Insufficient documentation

## 2019-09-01 DIAGNOSIS — E559 Vitamin D deficiency, unspecified: Secondary | ICD-10-CM | POA: Diagnosis not present

## 2019-09-01 DIAGNOSIS — N183 Chronic kidney disease, stage 3 unspecified: Secondary | ICD-10-CM | POA: Diagnosis not present

## 2019-09-01 DIAGNOSIS — G20 Parkinson's disease: Secondary | ICD-10-CM | POA: Diagnosis not present

## 2019-09-01 DIAGNOSIS — R7889 Finding of other specified substances, not normally found in blood: Secondary | ICD-10-CM | POA: Diagnosis not present

## 2019-09-01 DIAGNOSIS — Z66 Do not resuscitate: Secondary | ICD-10-CM | POA: Diagnosis not present

## 2019-09-01 DIAGNOSIS — R404 Transient alteration of awareness: Secondary | ICD-10-CM | POA: Diagnosis not present

## 2019-09-01 HISTORY — DX: Parkinson's disease without dyskinesia, without mention of fluctuations: G20.A1

## 2019-09-01 HISTORY — DX: Parkinson's disease: G20

## 2019-09-01 HISTORY — DX: Dementia in other diseases classified elsewhere, unspecified severity, without behavioral disturbance, psychotic disturbance, mood disturbance, and anxiety: F02.80

## 2019-09-01 HISTORY — DX: Disorder of kidney and ureter, unspecified: N28.9

## 2019-09-01 LAB — CBC WITH DIFFERENTIAL/PLATELET
Abs Immature Granulocytes: 0.02 10*3/uL (ref 0.00–0.07)
Basophils Absolute: 0 10*3/uL (ref 0.0–0.1)
Basophils Relative: 1 %
Eosinophils Absolute: 0.2 10*3/uL (ref 0.0–0.5)
Eosinophils Relative: 4 %
HCT: 23 % — ABNORMAL LOW (ref 39.0–52.0)
Hemoglobin: 6.8 g/dL — CL (ref 13.0–17.0)
Immature Granulocytes: 0 %
Lymphocytes Relative: 20 %
Lymphs Abs: 1.2 10*3/uL (ref 0.7–4.0)
MCH: 26.6 pg (ref 26.0–34.0)
MCHC: 29.6 g/dL — ABNORMAL LOW (ref 30.0–36.0)
MCV: 89.8 fL (ref 80.0–100.0)
Monocytes Absolute: 0.6 10*3/uL (ref 0.1–1.0)
Monocytes Relative: 10 %
Neutro Abs: 4 10*3/uL (ref 1.7–7.7)
Neutrophils Relative %: 65 %
Platelets: 247 10*3/uL (ref 150–400)
RBC: 2.56 MIL/uL — ABNORMAL LOW (ref 4.22–5.81)
RDW: 15 % (ref 11.5–15.5)
WBC: 6.1 10*3/uL (ref 4.0–10.5)
nRBC: 0 % (ref 0.0–0.2)

## 2019-09-01 LAB — BASIC METABOLIC PANEL
Anion gap: 9 (ref 5–15)
BUN: 39 mg/dL — ABNORMAL HIGH (ref 8–23)
CO2: 25 mmol/L (ref 22–32)
Calcium: 9 mg/dL (ref 8.9–10.3)
Chloride: 103 mmol/L (ref 98–111)
Creatinine, Ser: 1.61 mg/dL — ABNORMAL HIGH (ref 0.61–1.24)
GFR calc Af Amer: 44 mL/min — ABNORMAL LOW (ref >60)
GFR calc non Af Amer: 38 mL/min — ABNORMAL LOW (ref >60)
Glucose, Bld: 108 mg/dL — ABNORMAL HIGH (ref 70–99)
Potassium: 4.2 mmol/L (ref 3.5–5.1)
Sodium: 137 mmol/L (ref 135–145)

## 2019-09-01 LAB — IRON AND TIBC
Iron: 66 ug/dL (ref 45–182)
Saturation Ratios: 15 % — ABNORMAL LOW (ref 17.9–39.5)
TIBC: 431 ug/dL (ref 250–450)
UIBC: 365 ug/dL

## 2019-09-01 LAB — PROTIME-INR
INR: 1 (ref 0.8–1.2)
Prothrombin Time: 12.8 seconds (ref 11.4–15.2)

## 2019-09-01 LAB — PREPARE RBC (CROSSMATCH)

## 2019-09-01 LAB — POC OCCULT BLOOD, ED: Fecal Occult Bld: NEGATIVE

## 2019-09-01 LAB — FERRITIN: Ferritin: 18 ng/mL — ABNORMAL LOW (ref 24–336)

## 2019-09-01 MED ORDER — ONDANSETRON HCL 4 MG PO TABS: 4.0000 mg | ORAL_TABLET | Freq: Four times a day (QID) | ORAL | Status: DC | PRN

## 2019-09-01 MED ORDER — ACETAMINOPHEN 325 MG PO TABS: 650.0000 mg | ORAL_TABLET | Freq: Four times a day (QID) | ORAL | Status: DC | PRN

## 2019-09-01 MED ORDER — GUAIFENESIN ER 600 MG PO TB12
600.0000 mg | ORAL_TABLET | Freq: Two times a day (BID) | ORAL | Status: DC
  Administered 2019-09-02 – 2019-09-03 (×4): 600 mg via ORAL
  Filled 2019-09-01 (×5): qty 1

## 2019-09-01 MED ORDER — ONDANSETRON HCL 4 MG/2ML IJ SOLN: 4.0000 mg | Freq: Four times a day (QID) | INTRAMUSCULAR | Status: DC | PRN

## 2019-09-01 MED ORDER — CARBIDOPA-LEVODOPA 25-250 MG PO TABS
1.0000 | ORAL_TABLET | ORAL | Status: DC
  Administered 2019-09-02 – 2019-09-03 (×6): 1 via ORAL
  Filled 2019-09-01 (×8): qty 1

## 2019-09-01 MED ORDER — ENSURE ENLIVE PO LIQD
237.0000 mL | Freq: Two times a day (BID) | ORAL | Status: DC
  Administered 2019-09-02 – 2019-09-03 (×2): 237 mL via ORAL
  Filled 2019-09-01 (×2): qty 237

## 2019-09-01 MED ORDER — SODIUM CHLORIDE 0.9 % IV SOLN: 10.0000 mL/h | Freq: Once | INTRAVENOUS | Status: DC

## 2019-09-01 MED ORDER — CLONIDINE HCL 0.2 MG PO TABS
0.2000 mg | ORAL_TABLET | Freq: Three times a day (TID) | ORAL | Status: DC
  Administered 2019-09-02 – 2019-09-03 (×6): 0.2 mg via ORAL
  Filled 2019-09-01: qty 1
  Filled 2019-09-01: qty 2
  Filled 2019-09-01 (×4): qty 1
  Filled 2019-09-01: qty 2

## 2019-09-01 MED ORDER — ROSUVASTATIN CALCIUM 20 MG PO TABS
40.0000 mg | ORAL_TABLET | Freq: Every day | ORAL | Status: DC
  Administered 2019-09-02 – 2019-09-03 (×2): 40 mg via ORAL
  Filled 2019-09-01: qty 1
  Filled 2019-09-01 (×2): qty 2

## 2019-09-01 MED ORDER — THIAMINE HCL 100 MG PO TABS
100.0000 mg | ORAL_TABLET | Freq: Every day | ORAL | Status: DC
  Administered 2019-09-02 – 2019-09-03 (×2): 100 mg via ORAL
  Filled 2019-09-01 (×2): qty 1

## 2019-09-01 MED ORDER — ACETAMINOPHEN 650 MG RE SUPP: 650.0000 mg | Freq: Four times a day (QID) | RECTAL | Status: DC | PRN

## 2019-09-01 MED ORDER — ZINC SULFATE 220 (50 ZN) MG PO CAPS
220.0000 mg | ORAL_CAPSULE | ORAL | Status: DC
  Administered 2019-09-02: 220 mg via ORAL
  Filled 2019-09-01: qty 1

## 2019-09-01 MED ORDER — QUETIAPINE FUMARATE 25 MG PO TABS
50.0000 mg | ORAL_TABLET | Freq: Every day | ORAL | Status: DC
  Administered 2019-09-02 – 2019-09-03 (×3): 50 mg via ORAL
  Filled 2019-09-01: qty 2
  Filled 2019-09-01: qty 1
  Filled 2019-09-01: qty 2

## 2019-09-01 MED ORDER — PANTOPRAZOLE SODIUM 40 MG PO TBEC
40.0000 mg | DELAYED_RELEASE_TABLET | Freq: Every day | ORAL | Status: DC
  Administered 2019-09-02 – 2019-09-03 (×2): 40 mg via ORAL
  Filled 2019-09-01 (×2): qty 1

## 2019-09-01 MED ORDER — AMLODIPINE BESYLATE 5 MG PO TABS
2.5000 mg | ORAL_TABLET | Freq: Every day | ORAL | Status: DC
  Administered 2019-09-02 – 2019-09-03 (×2): 2.5 mg via ORAL
  Filled 2019-09-01 (×2): qty 1

## 2019-09-01 MED ORDER — POLYETHYLENE GLYCOL 3350 17 G PO PACK: 17.0000 g | PACK | Freq: Every day | ORAL | Status: DC | PRN

## 2019-09-01 NOTE — ED Triage Notes (Signed)
Patient presenst from [facility]. Today he was found to have a hemoglobin level of 6.8 at 1400 today.    EMS vitals: 164/62 BP 100 HR 98% O2 sat on room air 144 CBG 99 Temp

## 2019-09-01 NOTE — Progress Notes (Signed)
CRITICAL VALUE ALERT  Critical Value:  Hemoglobin 6.8  Date & Time Notied: 1705    09/01/19  Provider Notified: Melina Copa MD  Orders Received/Actions taken: none at this time

## 2019-09-01 NOTE — H&P (Signed)
History and Physical:    Jose Baldwin   [id number] DOB: [birth year] DOA: 09/01/2019  Referring MD/provider: Dr. Aletta Edouard PCP: Billie Ruddy, MD   Patient coming from: Rehab facility  Chief Complaint: Low hemoglobin  History of Present Illness:   Jose Baldwin is an 85 y.o. male with medical history significant for dementia, Parkinson's disease, prostate cancer, CKD stage III, history of syncope, hypertension.  He was brought to the hospital from the rehab facility because of hemoglobin of 6.8.was found on routine labs.  Patient denies any symptoms at this time.  However, he said that he "passed out" about 2 times in the past 2 weeks.  He said he last passed out about a week ago.  No shortness of breath, chest pain, palpitations, dizziness, vomiting, diarrhea, bloody stools or vomiting blood.  I spoke to his son, Mr. Victor Seagrave, who said that patient has been having recurrent severe anemia requiring blood transfusion for the past 3 years.  However, he said that the etiology of his anemia has not been established.  He also said that "he never has blood in his stool".  ED Course:  The patient found to have a hemoglobin of 6.8.  Heart rate is normal but blood pressure was elevated at 179/81.  Oxygen saturation is 100% on room air.  Stool for occult blood test was negative.  2 units of packed red blood cells have been ordered.  ROS:   ROS status reviewed were negative.  Past Medical History:   Past Medical History:  Diagnosis Date  . Alzheimer's dementia ([place])   . Hypertension   . Parkinson disease ([place])   . Prostate cancer ([place])   . Renal disorder   . Syncope     Past Surgical History:   Past Surgical History:  Procedure Laterality Date  . BACK SURGERY    . IR VERTEBROPLASTY CERV/THOR BX INC UNI/BIL INC/INJECT/IMAGING  01/31/2018  . IR VERTEBROPLASTY CERV/THOR BX INC UNI/BIL INC/INJECT/IMAGING  03/24/2019    Social History:   Social History   Socioeconomic  History  . Marital status: Single    Spouse name: Not on file  . Number of children: 3  . Years of education: Not on file  . Highest education level: Not on file  Occupational History  . Occupation: retired  Tobacco Use  . Smoking status: Former Smoker    Packs/day: 2.00    Years: 30.00    Pack years: 60.00    Types: Cigarettes    Quit date: 1990    Years since quitting: 30.9  . Smokeless tobacco: Never Used  Substance and Sexual Activity  . Alcohol use: Yes    Alcohol/week: 14.0 standard drinks    Types: 14 Cans of beer per week  . Drug use: Never  . Sexual activity: Not on file  Other Topics Concern  . Not on file  Social History Narrative   Reports that he quit smoking about 30 years ago. His smoking use included cigarettes. He has a 60.00 pack-year smoking history. He has never used smokeless tobacco. He reports current alcohol use of about 14.0 standard drinks of alcohol per week. He reports that he does not use drugs.     Social Determinants of Health   Financial Resource Strain:   . Difficulty of Paying Living Expenses: Not on file  Food Insecurity:   . Worried About Charity fundraiser in the Last Year: Not on file  . Ran Out of Food  in the Last Year: Not on file  Transportation Needs:   . Lack of Transportation (Medical): Not on file  . Lack of Transportation (Non-Medical): Not on file  Physical Activity:   . Days of Exercise per Week: Not on file  . Minutes of Exercise per Session: Not on file  Stress:   . Feeling of Stress : Not on file  Social Connections:   . Frequency of Communication with Friends and Family: Not on file  . Frequency of Social Gatherings with Friends and Family: Not on file  . Attends Religious Services: Not on file  . Active Member of Clubs or Organizations: Not on file  . Attends Archivist Meetings: Not on file  . Marital Status: Not on file  Intimate Partner Violence:   . Fear of Current or Ex-Partner: Not on file  .  Emotionally Abused: Not on file  . Physically Abused: Not on file  . Sexually Abused: Not on file    Allergies   Patient has no known allergies.  Family history:   Family History  Problem Relation Age of Onset  . Heart attack Father 82    Current Medications:   Prior to Admission medications   Medication Sig Start Date End Date Taking? Authorizing Provider  amLODipine (NORVASC) 2.5 MG tablet Take 1 tablet (2.5 mg total) by mouth daily. 05/26/19  Yes Hongalgi, Lenis Dickinson, MD  ascorbic acid (VITAMIN C) 500 MG tablet Take 500 mg by mouth daily. 08/04/19 11/01/19 Yes [provider]  aspirin 81 MG chewable tablet Chew 1 tablet (81 mg total) by mouth daily. 04/01/19  Yes Nita Sells, MD  carbidopa-levodopa (SINEMET IR) 25-250 MG tablet Take 1 tablet by mouth 3 (three) times daily. 9am, 1pm, 7pm   Yes [provider]  Cholecalciferol (VITAMIN D3) 1.25 MG (50000 UT) CAPS Take 1 capsule by mouth every 30 (thirty) days. 08/08/19 11/08/19 Yes [provider]  cloNIDine (CATAPRES) 0.2 MG tablet Take 0.2 mg by mouth 3 (three) times daily. 9am, 1pm, 7pm   Yes [provider]  feeding supplement, ENSURE ENLIVE, (ENSURE ENLIVE) LIQD Take 237 mLs by mouth 2 (two) times daily between meals. 05/25/19  Yes Hongalgi, Lenis Dickinson, MD  guaiFENesin (MUCINEX) 600 MG 12 hr tablet Take 600 mg by mouth 2 (two) times daily.   Yes [provider]  Multiple Vitamin (MULTIVITAMIN WITH MINERALS) TABS tablet Take 1 tablet by mouth daily. 05/26/19  Yes Hongalgi, Lenis Dickinson, MD  omeprazole (PRILOSEC) 40 MG capsule Take 40 mg by mouth daily.   Yes [provider]  polyethylene glycol (MIRALAX / GLYCOLAX) 17 g packet Take 17 g by mouth daily as needed for mild constipation. 04/23/19  Yes Aline August, MD  QUEtiapine (SEROQUEL) 50 MG tablet Take 50 mg by mouth daily at 6 PM.   Yes [provider]  rosuvastatin (CRESTOR) 40 MG tablet Take 1 tablet (40 mg total) by  mouth daily at 6 PM. 04/23/19  Yes Starla Link, Kshitiz, MD  thiamine (VITAMIN B-1) 100 MG tablet Take 100 mg by mouth daily. Take at 1 PM   Yes [provider]  zinc sulfate 220 (50 Zn) MG capsule Take 220 mg by mouth every other day. 08/04/19 11/01/19 Yes [provider]    Physical Exam:   Vitals:   09/01/19 1700 09/01/19 1721 09/01/19 1741 09/01/19 1745  BP: (!) 179/81   (!) 177/61  Pulse:  78    Resp: 16 15  14   Temp:  TempSrc:      SpO2:  100%    Weight:   60.6 kg   Height:   6\' 1"  (1.854 m)      Physical Exam: Blood pressure (!) 177/61, pulse 78, temperature 98 F (36.7 C), temperature source Oral, resp. rate 14, height 6\' 1"  (1.854 m), weight 60.6 kg, SpO2 100 %. Gen: No acute distress. Head: Normocephalic, atraumatic. Eyes: Pupils equal, round and reactive to light. Extraocular movements intact.  Sclerae nonicteric.  Pale but anicteric Mouth: Moist mucous membranes Neck: Supple, no thyromegaly, no lymphadenopathy, no jugular venous distention. Chest: Lungs are clear to auscultation with good air movement. No rales, rhonchi or wheezes.  CV: Heart sounds are regular with an S1, S2. No murmurs, rubs or gallops.  Abdomen: Soft, nontender, nondistended with normal active bowel sounds. No hepatosplenomegaly or palpable masses. Extremities: Extremities are without clubbing, or cyanosis. No edema. Pedal pulses 2+.  Skin: Warm and dry. No rashes, lesions or wounds. Neuro: Alert and oriented times 3; grossly nonfocal.  Psych: Insight is good and judgment is appropriate. Mood and affect normal.   Data Review:    Labs: Basic Metabolic Panel: Recent Labs  Lab 09/01/19 1645  NA 137  K 4.2  CL 103  CO2 25  GLUCOSE 108*  BUN 39*  CREATININE 1.61*  CALCIUM 9.0   Liver Function Tests: No results for input(s): AST, ALT, ALKPHOS, BILITOT, PROT, ALBUMIN in the last 168 hours. No results for input(s): LIPASE, AMYLASE in the last 168 hours. No results for  input(s): AMMONIA in the last 168 hours. CBC: Recent Labs  Lab 09/01/19 1645  WBC 6.1  NEUTROABS 4.0  HGB 6.8*  HCT 23.0*  MCV 89.8  PLT 247   Cardiac Enzymes: No results for input(s): CKTOTAL, CKMB, CKMBINDEX, TROPONINI in the last 168 hours.  BNP (last 3 results) No results for input(s): PROBNP in the last 8760 hours. CBG: No results for input(s): GLUCAP in the last 168 hours.  Urinalysis    Component Value Date/Time   COLORURINE YELLOW 06/01/2019 2137   APPEARANCEUR CLEAR 06/01/2019 2137   LABSPEC 1.016 06/01/2019 2137   PHURINE 6.0 06/01/2019 2137   GLUCOSEU NEGATIVE 06/01/2019 2137   HGBUR NEGATIVE 06/01/2019 2137   BILIRUBINUR NEGATIVE 06/01/2019 2137   KETONESUR 5 (A) 06/01/2019 2137   PROTEINUR NEGATIVE 06/01/2019 2137   NITRITE NEGATIVE 06/01/2019 2137   LEUKOCYTESUR NEGATIVE 06/01/2019 2137      Radiographic Studies: No results found.  EKG: Independently reviewed.  ?Atrial fibrillation, left bundle branch block   Assessment/Plan:   Active Problems:   Severe anemia   Body mass index is 17.63 kg/m.   Severe anemia: Admit to MedSurg and monitor on telemetry.  Transfuse 2 units of packed red blood cells as ordered by ED physician.  Check iron studies.  Monitor H&H.  Etiology of anemia is unclear at this time.  Parkinson's disease: Continue carbidopa-levodopa.  Hypertension: Continue antihypertensives  CKD stage III: Creatinine is stable  Cardiac arrhythmia/? Atrial fibrillation: Hold aspirin for now because of severe anemia.  Other information:   DVT prophylaxis: SCD Code Status: DNR. Family Communication: Plan was discussed with his son, Mr. Aniruddha Rothmeyer. Disposition Plan: Discharge to rehab facility in 1 to 2 days Consults called: None Admission status:     The medical decision making is of moderate complexity, therefore this is a level 2 visit.  Time spent 55 minutes  [facility]   How to contact the [facility]  Attending  or Consulting provider [place] or covering provider during after hours [place], for this patient?   1. Check the care team in [facility] and look for a) attending/consulting [facility] provider listed and b) the [facility] team listed 2. Log into [email or web address] and use [place]'s universal password to access. If you do not have the password, please contact the hospital operator. 3. Locate the [facility] provider you are looking for under [facility] and page to a number that you can be directly reached. 4. If you still have difficulty reaching the provider, please page the [facility] (Director on Call) for the Hospitalists listed on amion for assistance.  09/01/2019, 6:03 PM

## 2019-09-01 NOTE — ED Provider Notes (Signed)
[facility] DEPT Provider Note   CSN: [id number] Arrival date & time: 09/01/19  1544     History Chief Complaint  Patient presents with  . Abnormal Lab    Jose Baldwin is a 85 y.o. male.  85 year old male history of dementia, level 5 caveat.  He was sent in from his facility at [place] after routine blood draw showed that he was anemic.  He denies any complaints.  He has not seen any obvious bleeding.  No chest pain or shortness of breath no abdominal pain.  No lightheadedness dizziness.  He says he falls frequently and that is why he is at [place].  The history is provided by the patient and the EMS personnel.  Abnormal Lab Time since result:  Today Patient referred by:  PCP Resulting agency:  External Result type: hematology   Hematology:    Hematocrit:  Low   Hemoglobin:  Low      Past Medical History:  Diagnosis Date  . Alzheimer's dementia ([place])   . Hypertension   . Parkinson disease ([place])   . Prostate cancer ([place])   . Renal disorder   . Syncope     Patient Active Problem List   Diagnosis Date Noted  . Dementia with behavioral disturbance ([place]) 05/30/2019  . Hyperlipidemia 05/30/2019  . Bradycardia 05/22/2019  . Goals of care, counseling/discussion   . Palliative care by specialist   . Uncontrolled hypertension 05/21/2019  . Normocytic anemia 05/21/2019  . CKD (chronic kidney disease), stage III 05/21/2019  . Physical deconditioning 05/21/2019  . Hematuria 05/21/2019  . UTI (urinary tract infection) 05/20/2019  . Hypokalemia 04/20/2019  . Hypomagnesemia 04/20/2019  . Parkinson disease ([place]) 04/20/2019  . Cardiac arrhythmia 04/20/2019  . Acute renal failure superimposed on stage 3 chronic kidney disease ([place]) 04/20/2019  . Protein-calorie malnutrition, severe 03/31/2019  . Cerebral embolism with cerebral infarction 03/30/2019  . POTS (postural orthostatic tachycardia syndrome) 03/28/2019  . HTN (hypertension)  02/04/2018  . Prostate cancer ([place]) 02/04/2018  . Orthostatic hypotension 02/04/2018  . Iron deficiency anemia 02/04/2018  . Syncope 02/02/2018    Past Surgical History:  Procedure Laterality Date  . BACK SURGERY    . IR VERTEBROPLASTY CERV/THOR BX INC UNI/BIL INC/INJECT/IMAGING  01/31/2018  . IR VERTEBROPLASTY CERV/THOR BX INC UNI/BIL INC/INJECT/IMAGING  03/24/2019       Family History  Problem Relation Age of Onset  . Heart attack Father 51    Social History   Tobacco Use  . Smoking status: Former Smoker    Packs/day: 2.00    Years: 30.00    Pack years: 60.00    Types: Cigarettes    Quit date: 1990    Years since quitting: 30.9  . Smokeless tobacco: Never Used  Substance Use Topics  . Alcohol use: Yes    Alcohol/week: 14.0 standard drinks    Types: 14 Cans of beer per week  . Drug use: Never    Home Medications Prior to Admission medications   Medication Sig Start Date End Date Taking? Authorizing Provider  amLODipine (NORVASC) 2.5 MG tablet Take 1 tablet (2.5 mg total) by mouth daily. 05/26/19   Hongalgi, Lenis Dickinson, MD  ascorbic acid (VITAMIN C) 500 MG tablet Take 500 mg by mouth daily. 08/04/19 11/01/19  [provider]  aspirin 81 MG chewable tablet Chew 1 tablet (81 mg total) by mouth daily. 04/01/19   Nita Sells, MD  carbidopa-levodopa (SINEMET IR) 25-250 MG tablet Take 1 tablet  by mouth 3 (three) times daily. 9am, 1pm, 7pm    [provider]  Cholecalciferol (VITAMIN D3) 1.25 MG (50000 UT) CAPS Take 1 capsule by mouth every 30 (thirty) days. 08/08/19 11/08/19  [provider]  cloNIDine (CATAPRES) 0.2 MG tablet Take 0.2 mg by mouth 3 (three) times daily. 9am, 1pm, 7pm    [provider]  feeding supplement, ENSURE ENLIVE, (ENSURE ENLIVE) LIQD Take 237 mLs by mouth 2 (two) times daily between meals. 05/25/19   Hongalgi, Lenis Dickinson, MD  guaiFENesin (MUCINEX) 600 MG 12 hr tablet Take 600 mg by mouth 2 (two) times daily.     [provider]  Multiple Vitamin (MULTIVITAMIN WITH MINERALS) TABS tablet Take 1 tablet by mouth daily. 05/26/19   Hongalgi, Lenis Dickinson, MD  omeprazole (PRILOSEC) 40 MG capsule Take 40 mg by mouth daily.    [provider]  polyethylene glycol (MIRALAX / GLYCOLAX) 17 g packet Take 17 g by mouth daily as needed for mild constipation. 04/23/19   Aline August, MD  promethazine (PHENERGAN) 25 MG tablet Take 25 mg by mouth every 6 (six) hours as needed for nausea or vomiting.    [provider]  QUEtiapine (SEROQUEL) 50 MG tablet Take 50 mg by mouth daily at 6 PM.    [provider]  ranibizumab (LUCENTIS) 0.5 MG/0.05ML SOLN 0.5 mg by Intravitreal route every 8 (eight) weeks. Performed in physician office    [provider]  rosuvastatin (CRESTOR) 40 MG tablet Take 1 tablet (40 mg total) by mouth daily at 6 PM. 04/23/19   Aline August, MD  thiamine (VITAMIN B-1) 100 MG tablet Take 100 mg by mouth daily. Take at 1 PM    [provider]  zinc sulfate 220 (50 Zn) MG capsule Take 220 mg by mouth every other day. 08/04/19 11/01/19  [provider]    Allergies    Patient has no known allergies.  Review of Systems   Review of Systems  Constitutional: Negative for fever.  HENT: Negative for sore throat.   Eyes: Negative for visual disturbance.  Respiratory: Negative for shortness of breath.   Cardiovascular: Negative for chest pain.  Gastrointestinal: Negative for abdominal pain, blood in stool, nausea and vomiting.  Genitourinary: Negative for dysuria.  Musculoskeletal: Negative for neck pain.  Skin: Negative for rash.  Neurological: Negative for light-headedness and headaches.    Physical Exam Updated Vital Signs BP (!) 181/90 (BP Location: Right Arm)   Pulse 77   Temp 97.9 F (36.6 C) (Oral)   Resp 14   Ht 6\' 1"  (1.854 m)   Wt 60.6 kg   SpO2 97%   BMI 17.63 kg/m   Physical Exam Vitals and nursing note reviewed.   Constitutional:      Appearance: He is well-developed.  HENT:     Head: Normocephalic and atraumatic.  Eyes:     Conjunctiva/sclera: Conjunctivae normal.  Cardiovascular:     Rate and Rhythm: Normal rate and regular rhythm.     Pulses: Normal pulses.     Heart sounds: No murmur.  Pulmonary:     Effort: Pulmonary effort is normal. No respiratory distress.     Breath sounds: Normal breath sounds.  Abdominal:     Palpations: Abdomen is soft.     Tenderness: There is no abdominal tenderness.  Musculoskeletal:        General: No deformity. Normal range of motion.     Cervical back: Neck supple.  Right lower leg: No edema.     Left lower leg: No edema.  Skin:    General: Skin is warm and dry.     Capillary Refill: Capillary refill takes less than 2 seconds.  Neurological:     General: No focal deficit present.     Mental Status: He is alert. Mental status is at baseline.     ED Results / Procedures / Treatments   Labs (all labs ordered are listed, but only abnormal results are displayed) Labs Reviewed  BASIC METABOLIC PANEL - Abnormal; Notable for the following components:      Result Value   Glucose, Bld 108 (*)    BUN 39 (*)    Creatinine, Ser 1.61 (*)    GFR calc non Af Amer 38 (*)    GFR calc Af Amer 44 (*)    All other components within normal limits  CBC WITH DIFFERENTIAL/PLATELET - Abnormal; Notable for the following components:   RBC 2.56 (*)    Hemoglobin 6.8 (*)    HCT 23.0 (*)    MCHC 29.6 (*)    All other components within normal limits  FERRITIN - Abnormal; Notable for the following components:   Ferritin 18 (*)    All other components within normal limits  IRON AND TIBC - Abnormal; Notable for the following components:   Saturation Ratios 15 (*)    All other components within normal limits  BASIC METABOLIC PANEL - Abnormal; Notable for the following components:   CO2 21 (*)    Glucose, Bld 101 (*)    BUN 36 (*)    Creatinine, Ser 1.54 (*)    GFR  calc non Af Amer 40 (*)    GFR calc Af Amer 46 (*)    All other components within normal limits  CBC - Abnormal; Notable for the following components:   RBC 2.92 (*)    Hemoglobin 8.1 (*)    HCT 27.1 (*)    MCHC 29.9 (*)    All other components within normal limits  SARS CORONAVIRUS 2 (TAT 6-24 HRS)  MRSA PCR SCREENING  PROTIME-INR  POC OCCULT BLOOD, ED  TYPE AND SCREEN  PREPARE RBC (CROSSMATCH)  ABO/RH    EKG EKG Interpretation  Date/Time:  Tuesday September 01 2019 16:04:49 EST Ventricular Rate:  79 PR Interval:    QRS Duration: 141 QT Interval:  415 QTC Calculation: 476 R Axis:   -60 Text Interpretation: Atrial flutter with varied AV block, Left bundle branch block similar to prior 11/20 Confirmed by Aletta Edouard [phone]) on 09/01/2019 4:09:36 PM   Radiology No results found.  Procedures .Critical Care Performed by: Hayden Rasmussen, MD Authorized by: Hayden Rasmussen, MD   Critical care provider statement:    Critical care time (minutes):  30   Critical care time was exclusive of:  Separately billable procedures and treating other patients   Critical care was necessary to treat or prevent imminent or life-threatening deterioration of the following conditions:  Circulatory failure   Critical care was time spent personally by me on the following activities:  Evaluation of patient's response to treatment, examination of patient, ordering and performing treatments and interventions, ordering and review of laboratory studies, pulse oximetry, re-evaluation of patient's condition, obtaining history from patient or surrogate, review of old charts and development of treatment plan with patient or surrogate   I assumed direction of critical care for this patient from another provider in my specialty: no     (  including critical care time)  Medications Ordered in ED Medications  0.9 %  sodium chloride infusion (has no administration in time range)  amLODipine (NORVASC)  tablet 2.5 mg (2.5 mg Oral Given 09/02/19 0811)  cloNIDine (CATAPRES) tablet 0.2 mg (0.2 mg Oral Given 09/02/19 0811)  rosuvastatin (CRESTOR) tablet 40 mg (40 mg Oral Not Given 09/02/19 0516)  QUEtiapine (SEROQUEL) tablet 50 mg (50 mg Oral Given 09/02/19 0022)  pantoprazole (PROTONIX) EC tablet 40 mg (40 mg Oral Given 09/02/19 0811)  polyethylene glycol (MIRALAX / GLYCOLAX) packet 17 g (has no administration in time range)  carbidopa-levodopa (SINEMET IR) 25-250 MG per tablet immediate release 1 tablet (1 tablet Oral Given 09/02/19 0811)  feeding supplement (ENSURE ENLIVE) (ENSURE ENLIVE) liquid 237 mL (has no administration in time range)  thiamine tablet 100 mg (100 mg Oral Given 09/02/19 [id number])  zinc sulfate capsule 220 mg (220 mg Oral Given 09/02/19 0810)  guaiFENesin (MUCINEX) 12 hr tablet 600 mg (600 mg Oral Given 09/02/19 0022)  acetaminophen (TYLENOL) tablet 650 mg (has no administration in time range)    Or  acetaminophen (TYLENOL) suppository 650 mg (has no administration in time range)  ondansetron (ZOFRAN) tablet 4 mg (has no administration in time range)    Or  ondansetron (ZOFRAN) injection 4 mg (has no administration in time range)    ED Course  I have reviewed the triage vital signs and the nursing notes.  Pertinent labs & imaging results that were available during my care of the patient were reviewed by me and considered in my medical decision making (see chart for details).  Clinical Course as of Sep 01 1201  Tue Dec 22, 817  7133 85 year old male history of dementia and Parkinson's here after having found to be anemic on a routine blood draw.  Patient is denying any complaints.  Differential includes lab error, anemia, GI bleed, renal failure.  Checking some screening labs as I am unable to see lab work that he had done yesterday.   [MB]  1612 Rectal exam done with tech as chaperone.  Normal sphincter tone.  Stool in vault brown.  Sent to lab for guaiac.   [MB]  60  Discussed with [facility] hospitalist who accepts the patient for admission.  I reviewed with the patient his lab results and the need for transfusion and he understands and accepts transfusion and admission.   [MB]    Clinical Course User Index [MB] Hayden Rasmussen, MD   MDM Rules/Calculators/A&P                      Final Clinical Impression(s) / ED Diagnoses Final diagnoses:  Symptomatic anemia    Rx / DC Orders ED Discharge Orders    None       Hayden Rasmussen, MD 09/02/19 1204

## 2019-09-02 DIAGNOSIS — D649 Anemia, unspecified: Secondary | ICD-10-CM | POA: Diagnosis not present

## 2019-09-02 LAB — CBC
HCT: 27.1 % — ABNORMAL LOW (ref 39.0–52.0)
Hemoglobin: 8.1 g/dL — ABNORMAL LOW (ref 13.0–17.0)
MCH: 27.7 pg (ref 26.0–34.0)
MCHC: 29.9 g/dL — ABNORMAL LOW (ref 30.0–36.0)
MCV: 92.8 fL (ref 80.0–100.0)
Platelets: 209 10*3/uL (ref 150–400)
RBC: 2.92 MIL/uL — ABNORMAL LOW (ref 4.22–5.81)
RDW: 15.1 % (ref 11.5–15.5)
WBC: 7.9 10*3/uL (ref 4.0–10.5)
nRBC: 0 % (ref 0.0–0.2)

## 2019-09-02 LAB — BASIC METABOLIC PANEL
Anion gap: 10 (ref 5–15)
BUN: 36 mg/dL — ABNORMAL HIGH (ref 8–23)
CO2: 21 mmol/L — ABNORMAL LOW (ref 22–32)
Calcium: 8.9 mg/dL (ref 8.9–10.3)
Chloride: 107 mmol/L (ref 98–111)
Creatinine, Ser: 1.54 mg/dL — ABNORMAL HIGH (ref 0.61–1.24)
GFR calc Af Amer: 46 mL/min — ABNORMAL LOW (ref >60)
GFR calc non Af Amer: 40 mL/min — ABNORMAL LOW (ref >60)
Glucose, Bld: 101 mg/dL — ABNORMAL HIGH (ref 70–99)
Potassium: 4.6 mmol/L (ref 3.5–5.1)
Sodium: 138 mmol/L (ref 135–145)

## 2019-09-02 LAB — ABO/RH: ABO/RH(D): O POS

## 2019-09-02 LAB — MRSA PCR SCREENING: MRSA by PCR: NEGATIVE

## 2019-09-02 LAB — SARS CORONAVIRUS 2 (TAT 6-24 HRS): SARS Coronavirus 2: NEGATIVE

## 2019-09-02 MED ORDER — LIP MEDEX EX OINT
TOPICAL_OINTMENT | CUTANEOUS | Status: AC
  Filled 2019-09-02: qty 7

## 2019-09-02 NOTE — Progress Notes (Addendum)
Patient transferred to room 1410. Telemetry showing NSR with 1st degree AVB. VSS, pt in no distress. Pt very drowsy, but can sit up with help and drink water/take medications. Pt's son, Inetta Fermo, notified of transfer- phone number of 4E secretary unit provided also.

## 2019-09-02 NOTE — ED Notes (Signed)
ED TO INPATIENT HANDOFF REPORT  ED Nurse Name and Phone #: [phone] Sherolyn Buba Name/Age/Gender Jose Baldwin 85 y.o. male Room/Bed: WA04/WA04  Code Status   Code Status: DNR  Home/SNF/Other Home Patient oriented to: self, place and situation Is this baseline? Yes   Triage Complete: Triage complete  Chief Complaint Severe anemia [D64.9]  Triage Note Patient presenst from [facility]. Today he was found to have a hemoglobin level of 6.8 at 1400 today.    EMS vitals: 164/62 BP 100 HR 98% O2 sat on room air 144 CBG 99 Temp    Allergies No Known Allergies  Level of Care/Admitting Diagnosis ED Disposition    ED Disposition Condition Comment   Admit  Hospital Area: [facility] [[id number]]  Level of Care: Med-Surg [16]  Covid Evaluation: Asymptomatic Screening Protocol (No Symptoms)  Diagnosis: Severe anemia [id number]  Admitting Physician: Rayburn Go  Attending Physician: Rayburn Go  PT Class (Do Not Modify): Observation [104]  PT Acc Code (Do Not Modify): Observation [10022]       B Medical/Surgery History Past Medical History:  Diagnosis Date  . Alzheimer's dementia ([place])   . Hypertension   . Parkinson disease ([place])   . Prostate cancer ([place])   . Renal disorder   . Syncope    Past Surgical History:  Procedure Laterality Date  . BACK SURGERY    . IR VERTEBROPLASTY CERV/THOR BX INC UNI/BIL INC/INJECT/IMAGING  01/31/2018  . IR VERTEBROPLASTY CERV/THOR BX INC UNI/BIL INC/INJECT/IMAGING  03/24/2019     A IV Location/Drains/Wounds Patient Lines/Drains/Airways Status   Active Line/Drains/Airways    Name:   Placement date:   Placement time:   Site:   Days:   Peripheral IV 09/01/19 Left Forearm   09/01/19    1739    Forearm   1   Peripheral IV 09/01/19 Left Forearm   09/01/19    2123    Forearm   1   Incision (Closed) 01/31/18 Back Medial;Mid   01/31/18    1124     579   Incision (Closed) 03/24/19  Back Mid   03/24/19    1248     162          Intake/Output Last 24 hours  Intake/Output Summary (Last 24 hours) at 09/02/2019 0314 Last data filed at 09/02/2019 0145 Gross per 24 hour  Intake 630 ml  Output --  Net 630 ml    Labs/Imaging Results for orders placed or performed during the hospital encounter of 09/01/19 (from the past 48 hour(s))  POC occult blood, ED     Status: None   Collection Time: 09/01/19  4:22 PM  Result Value Ref Range   Fecal Occult Bld NEGATIVE NEGATIVE  Basic metabolic panel     Status: Abnormal   Collection Time: 09/01/19  4:45 PM  Result Value Ref Range   Sodium 137 135 - 145 mmol/L   Potassium 4.2 3.5 - 5.1 mmol/L   Chloride 103 98 - 111 mmol/L   CO2 25 22 - 32 mmol/L   Glucose, Bld 108 (H) 70 - 99 mg/dL   BUN 39 (H) 8 - 23 mg/dL   Creatinine, Ser 1.61 (H) 0.61 - 1.24 mg/dL   Calcium 9.0 8.9 - 10.3 mg/dL   GFR calc non Af Amer 38 (L) >60 mL/min   GFR calc Af Amer 44 (L) >60 mL/min   Anion gap 9 5 - 15    Comment: Performed  at [facility], [place] [address], [place], [place] [address]  CBC with Differential     Status: Abnormal   Collection Time: 09/01/19  4:45 PM  Result Value Ref Range   WBC 6.1 4.0 - 10.5 K/uL   RBC 2.56 (L) 4.22 - 5.81 MIL/uL   Hemoglobin 6.8 (LL) 13.0 - 17.0 g/dL    Comment: REPEATED TO VERIFY THIS CRITICAL RESULT HAS VERIFIED AND BEEN CALLED TO STACY WEST RN BY MARVIN SCOTTON ON 12 22 2020 AT [id number], AND HAS BEEN READ BACK.     HCT 23.0 (L) 39.0 - 52.0 %   MCV 89.8 80.0 - 100.0 fL   MCH 26.6 26.0 - 34.0 pg   MCHC 29.6 (L) 30.0 - 36.0 g/dL   RDW 15.0 11.5 - 15.5 %   Platelets 247 150 - 400 K/uL   nRBC 0.0 0.0 - 0.2 %   Neutrophils Relative % 65 %   Neutro Abs 4.0 1.7 - 7.7 K/uL   Lymphocytes Relative 20 %   Lymphs Abs 1.2 0.7 - 4.0 K/uL   Monocytes Relative 10 %   Monocytes Absolute 0.6 0.1 - 1.0 K/uL   Eosinophils Relative 4 %   Eosinophils Absolute 0.2 0.0 - 0.5 K/uL   Basophils Relative 1  %   Basophils Absolute 0.0 0.0 - 0.1 K/uL   Immature Granulocytes 0 %   Abs Immature Granulocytes 0.02 0.00 - 0.07 K/uL    Comment: Performed at [facility], [place] [address] [place], [place] [address]  Protime-INR     Status: None   Collection Time: 09/01/19  4:45 PM  Result Value Ref Range   Prothrombin Time 12.8 11.4 - 15.2 seconds   INR 1.0 0.8 - 1.2    Comment: (NOTE) INR goal varies based on device and disease states. Performed at [facility], [place] [address], [place], [place] [address]   Type and screen [place]     Status: None (Preliminary result)   Collection Time: 09/01/19  4:45 PM  Result Value Ref Range   ABO/RH(D) O POS    Antibody Screen NEG    Sample Expiration 09/04/2019,2359    Unit Number [id number]    Blood Component Type RED CELLS,LR    Unit division 00    Status of Unit ISSUED    Transfusion Status OK TO TRANSFUSE    Crossmatch Result      Compatible Performed at [facility], [place] [address], [place], [place] [address]    Unit Number [id number]    Blood Component Type RBC LR PHER1    Unit division 00    Status of Unit ALLOCATED    Transfusion Status OK TO TRANSFUSE    Crossmatch Result Compatible   ABO/Rh     Status: None (Preliminary result)   Collection Time: 09/01/19  4:45 PM  Result Value Ref Range   ABO/RH(D)      Jenetta Downer POS Performed at [facility], [place] [address] [place], [place] [address]   Prepare RBC     Status: None   Collection Time: 09/01/19  5:20 PM  Result Value Ref Range   Order Confirmation      ORDER PROCESSED BY BLOOD BANK Performed at [place] [address] [place], [place] [address]   SARS CORONAVIRUS 2 (TAT 6-24 HRS) Nasopharyngeal Nasopharyngeal Swab     Status: None   Collection Time: 09/01/19  5:45 PM  Specimen: Nasopharyngeal Swab  Result Value Ref Range   SARS Coronavirus 2 NEGATIVE  NEGATIVE    Comment: (NOTE) SARS-CoV-2 target nucleic acids are NOT DETECTED. The SARS-CoV-2 RNA is generally detectable in upper and lower respiratory specimens during the acute phase of infection. Negative results do not preclude SARS-CoV-2 infection, do not rule out co-infections with other pathogens, and should not be used as the sole basis for treatment or other patient management decisions. Negative results must be combined with clinical observations, patient history, and epidemiological information. The expected result is Negative. Fact Sheet for Patients: SugarRoll.be Fact Sheet for Healthcare Providers: https://www.woods-mathews.com/ This test is not yet approved or cleared by the [place] FDA and  has been authorized for detection and/or diagnosis of SARS-CoV-2 by FDA under an Emergency Use Authorization (EUA). This EUA will remain  in effect (meaning this test can be used) for the duration of the COVID-19 declaration under Section 56 4(b)(1) of the Act, 21 U.S.C. section 360bbb-3(b)(1), unless the authorization is terminated or revoked sooner. Performed at [facility], [place] [address] [place], [place] [address]   Ferritin     Status: Abnormal   Collection Time: 09/01/19  7:00 PM  Result Value Ref Range   Ferritin 18 (L) 24 - 336 ng/mL    Comment: Performed at [facility], [place] [address] [place], [place] [address]  Iron and TIBC     Status: Abnormal   Collection Time: 09/01/19  7:00 PM  Result Value Ref Range   Iron 66 45 - 182 ug/dL   TIBC 431 250 - 450 ug/dL   Saturation Ratios 15 (L) 17.9 - 39.5 %   UIBC 365 ug/dL    Comment: Performed at [facility], [place] [address] [place], [place] [address]   No results found.  Pending Labs Unresulted Labs (From admission, onward)    Start     Ordered   09/02/19 [id number]  Basic metabolic panel  Tomorrow morning,   R     09/01/19 2329    09/02/19 0500  CBC  Tomorrow morning,   R     09/01/19 2329          Vitals/Pain Today's Vitals   09/02/19 0043 09/02/19 0100 09/02/19 0130 09/02/19 0300  BP: (!) 166/88 140/71 131/69 (!) 99/57  Pulse: 88 (!) 59 (!) 46 70  Resp: 14 15 19 18   Temp:      TempSrc:      SpO2: 99% 97% (!) 86% 97%  Weight:      Height:      PainSc: 0-No pain       Isolation Precautions No active isolations  Medications Medications  0.9 %  sodium chloride infusion (has no administration in time range)  amLODipine (NORVASC) tablet 2.5 mg (has no administration in time range)  cloNIDine (CATAPRES) tablet 0.2 mg (0.2 mg Oral Given 09/02/19 0022)  rosuvastatin (CRESTOR) tablet 40 mg (has no administration in time range)  QUEtiapine (SEROQUEL) tablet 50 mg (50 mg Oral Given 09/02/19 0022)  pantoprazole (PROTONIX) EC tablet 40 mg (has no administration in time range)  polyethylene glycol (MIRALAX / GLYCOLAX) packet 17 g (has no administration in time range)  carbidopa-levodopa (SINEMET IR) 25-250 MG per tablet immediate release 1 tablet (1 tablet Oral Given 09/02/19 0037)  feeding supplement (ENSURE ENLIVE) (ENSURE ENLIVE) liquid 237 mL (has no administration in time range)  thiamine tablet 100 mg (has no administration in time range)  zinc sulfate  capsule 220 mg (has no administration in time range)  guaiFENesin (MUCINEX) 12 hr tablet 600 mg (600 mg Oral Given 09/02/19 0022)  acetaminophen (TYLENOL) tablet 650 mg (has no administration in time range)    Or  acetaminophen (TYLENOL) suppository 650 mg (has no administration in time range)  ondansetron (ZOFRAN) tablet 4 mg (has no administration in time range)    Or  ondansetron (ZOFRAN) injection 4 mg (has no administration in time range)    Mobility walks with person assist High fall risk   Focused Assessments    R Recommendations: See Admitting Provider Note  Report given to:   Additional Notes:

## 2019-09-02 NOTE — Plan of Care (Signed)
  Problem: Education: °Goal: Knowledge of General Education information will improve °Description: Including pain rating scale, medication(s)/side effects and non-pharmacologic comfort measures °Outcome: Progressing °  °Problem: Clinical Measurements: °Goal: Respiratory complications will improve °Outcome: Progressing °Goal: Cardiovascular complication will be avoided °Outcome: Progressing °  °Problem: Coping: °Goal: Level of anxiety will decrease °Outcome: Progressing °  °Problem: Elimination: °Goal: Will not experience complications related to urinary retention °Outcome: Progressing °  °Problem: Safety: °Goal: Ability to remain free from injury will improve °Outcome: Progressing °  °Problem: Skin Integrity: °Goal: Risk for impaired skin integrity will decrease °Outcome: Progressing °  °

## 2019-09-02 NOTE — Progress Notes (Signed)
   09/02/19 0344  MEWS Score  Resp (!) 21  Pulse Rate 69  BP (!) 177/69 (RN notified )  Temp (!) 97.4 F (36.3 C)  SpO2 100 %  O2 Device Room Air  O2 Flow Rate (L/min) 0 L/min   RN notified Sharlet Salina, NP.

## 2019-09-02 NOTE — Progress Notes (Signed)
PROGRESS NOTE  Jose Baldwin [id number] DOB: [birth year] DOA: 09/01/2019 PCP: Billie Ruddy, MD  HPI/Recap of past 24 hours: HPI from Dr Rivka Spring is an 85 y.o. male with medical history significant for dementia, Parkinson's disease, prostate cancer, CKD stage III, history of syncope, hypertension. Pt was brought to the hospital from the rehab facility because of hemoglobin of 6.8.was found on routine labs.  Patient denies any symptoms at this time.  However, he said that he "passed out" about 2 times in the past 2 weeks. Son was contacted, who said that patient has been having recurrent severe anemia requiring blood transfusion for the past 3 years.  However, he said that the etiology of his anemia has not been established.  He also said that "he never has blood in his stool". In the ED, patient found to have a hemoglobin of 6.8. Stool for occult blood test was negative. Pt admitted for further management.    Today, pt was noted to be very sleepy, unable to have a conversation with him, as he continued to sleep off. Didn't appear to be in distress.  Early this a.m., noted to be bradycardic.  Telemetry also showing first-degree AV block.  Assessment/Plan: Active Problems:   Severe anemia   Acute on chronic blood loss anemia Unknown etiology FOBT negative, no signs of bleeding Anemia panel showed iron 66, TIBC 431, sats 15, ferritin 18 Status post 2U PRBC, hemoglobin currently stable Of note, patient has an appointment with  GI on 09/10/2019 at 9:30 AM Repeat CBC in a.m.  CKD stage III Creatinine at baseline  HTN Stable Continue Norvasc, clonidine  HLD Continue Crestor  GERD Continue PPI  Parkinsons disease Continue carbidopa-levodopa        Malnutrition Type:      Malnutrition Characteristics:      Nutrition Interventions:       Estimated body mass index is 17.63 kg/m as calculated from the following:   Height as of this  encounter: 6\' 1"  (1.854 m).   Weight as of this encounter: 60.6 kg.     Code Status: DNR  Family Communication: None at bedside  Disposition Plan: To be determined, likely back to SNF   Consultants:  None  Procedures:  None  Antimicrobials:  None  DVT prophylaxis: SCD   Objective: Vitals:   09/02/19 0513 09/02/19 0526 09/02/19 0811 09/02/19 1238  BP: 140/63 140/63 (!) 181/90 (!) 170/59  Pulse: 73 67 77 70  Resp: 13 13 14 14   Temp: 97.9 F (36.6 C) 97.9 F (36.6 C)  97.6 F (36.4 C)  TempSrc: Axillary Oral  Oral  SpO2: 99% 97% 97% 96%  Weight:      Height:        Intake/Output Summary (Last 24 hours) at 09/02/2019 1736 Last data filed at 09/02/2019 0800 Gross per 24 hour  Intake 994 ml  Output --  Net 994 ml   Filed Weights   09/01/19 1741  Weight: 60.6 kg    Exam:  General: NAD, drowsy, unable to have a meaningful conversation today  Cardiovascular: S1, S2 present  Respiratory: CTAB  Abdomen: Soft, nontender, nondistended, bowel sounds present  Musculoskeletal: No bilateral pedal edema noted  Skin: Normal  Psychiatry:  Unable to assess    Data Reviewed: CBC: Recent Labs  Lab 09/01/19 1645 09/02/19 0422  WBC 6.1 7.9  NEUTROABS 4.0  --   HGB 6.8* 8.1*  HCT 23.0* 27.1*  MCV 89.8 92.8  PLT  247 [id number]   Basic Metabolic Panel: Recent Labs  Lab 09/01/19 1645 09/02/19 0422  NA 137 138  K 4.2 4.6  CL 103 107  CO2 25 21*  GLUCOSE 108* 101*  BUN 39* 36*  CREATININE 1.61* 1.54*  CALCIUM 9.0 8.9   GFR: Estimated Creatinine Clearance: 28.4 mL/min (A) (by C-G formula based on SCr of 1.54 mg/dL (H)). Liver Function Tests: No results for input(s): AST, ALT, ALKPHOS, BILITOT, PROT, ALBUMIN in the last 168 hours. No results for input(s): LIPASE, AMYLASE in the last 168 hours. No results for input(s): AMMONIA in the last 168 hours. Coagulation Profile: Recent Labs  Lab 09/01/19 1645  INR 1.0   Cardiac Enzymes: No results for  input(s): CKTOTAL, CKMB, CKMBINDEX, TROPONINI in the last 168 hours. BNP (last 3 results) No results for input(s): PROBNP in the last 8760 hours. HbA1C: No results for input(s): HGBA1C in the last 72 hours. CBG: No results for input(s): GLUCAP in the last 168 hours. Lipid Profile: No results for input(s): CHOL, HDL, LDLCALC, TRIG, CHOLHDL, LDLDIRECT in the last 72 hours. Thyroid Function Tests: No results for input(s): TSH, T4TOTAL, FREET4, T3FREE, THYROIDAB in the last 72 hours. Anemia Panel: Recent Labs    09/01/19 1900  FERRITIN 18*  TIBC 431  IRON 66   Urine analysis:    Component Value Date/Time   COLORURINE YELLOW 06/01/2019 2137   APPEARANCEUR CLEAR 06/01/2019 2137   LABSPEC 1.016 06/01/2019 2137   PHURINE 6.0 06/01/2019 2137   GLUCOSEU NEGATIVE 06/01/2019 2137   HGBUR NEGATIVE 06/01/2019 2137   BILIRUBINUR NEGATIVE 06/01/2019 2137   KETONESUR 5 (A) 06/01/2019 2137   PROTEINUR NEGATIVE 06/01/2019 2137   NITRITE NEGATIVE 06/01/2019 2137   LEUKOCYTESUR NEGATIVE 06/01/2019 2137   Sepsis Labs: @LABRCNTIP (procalcitonin:4,lacticidven:4)  ) Recent Results (from the past 240 hour(s))  SARS CORONAVIRUS 2 (TAT 6-24 HRS) Nasopharyngeal Nasopharyngeal Swab     Status: None   Collection Time: 09/01/19  5:45 PM   Specimen: Nasopharyngeal Swab  Result Value Ref Range Status   SARS Coronavirus 2 NEGATIVE NEGATIVE Final    Comment: (NOTE) SARS-CoV-2 target nucleic acids are NOT DETECTED. The SARS-CoV-2 RNA is generally detectable in upper and lower respiratory specimens during the acute phase of infection. Negative results do not preclude SARS-CoV-2 infection, do not rule out co-infections with other pathogens, and should not be used as the sole basis for treatment or other patient management decisions. Negative results must be combined with clinical observations, patient history, and epidemiological information. The expected result is Negative. Fact Sheet for  Patients: SugarRoll.be Fact Sheet for Healthcare Providers: https://www.woods-mathews.com/ This test is not yet approved or cleared by the [place] FDA and  has been authorized for detection and/or diagnosis of SARS-CoV-2 by FDA under an Emergency Use Authorization (EUA). This EUA will remain  in effect (meaning this test can be used) for the duration of the COVID-19 declaration under Section 56 4(b)(1) of the Act, 21 U.S.C. section 360bbb-3(b)(1), unless the authorization is terminated or revoked sooner. Performed at [facility], [place] [address] [place], [place] [address]       Studies: No results found.  Scheduled Meds: . amLODipine  2.5 mg Oral Daily  . carbidopa-levodopa  1 tablet Oral 3 times per day  . cloNIDine  0.2 mg Oral TID  . feeding supplement (ENSURE ENLIVE)  237 mL Oral BID BM  . guaiFENesin  600 mg Oral BID  . pantoprazole  40 mg Oral Daily  .  QUEtiapine  50 mg Oral q1800  . rosuvastatin  40 mg Oral q1800  . thiamine  100 mg Oral Daily  . zinc sulfate  220 mg Oral QODAY    Continuous Infusions: . sodium chloride       LOS: 1 day     Alma Friendly, MD [facility]  If 7PM-7AM, please contact night-coverage [email or web address] 09/02/2019, 5:36 PM

## 2019-09-02 NOTE — Progress Notes (Signed)
RN notified Sharlet Salina, NP that the patient's HR on the dinamap drops from the 70s to 26 then back up to the 70s in a matter of seconds. Manual Right Radial Pulse was 46 and then 72, irregular. RN told NP that the patient had an order for routine cardiac monitoring that was discontinued prior to arriving on the unit. Patient's EKG in the ED showed Aflutter with a varied AV block, L Bundle branch block. No tele beds available per Mellody Dance.   Orders received: RN to continue to monitor the patient and provide the above information to the day shift RN for further review by the Physician.

## 2019-09-02 NOTE — Progress Notes (Signed)
Pt awakens with verbal stimuli, denies pain, foam on coccyx, condom cath patent, HOB elevated, occasional loose non productive cough. q 2 turn.

## 2019-09-03 DIAGNOSIS — R531 Weakness: Secondary | ICD-10-CM | POA: Diagnosis not present

## 2019-09-03 DIAGNOSIS — D649 Anemia, unspecified: Secondary | ICD-10-CM | POA: Diagnosis not present

## 2019-09-03 DIAGNOSIS — Z743 Need for continuous supervision: Secondary | ICD-10-CM | POA: Diagnosis not present

## 2019-09-03 DIAGNOSIS — Z7401 Bed confinement status: Secondary | ICD-10-CM | POA: Diagnosis not present

## 2019-09-03 DIAGNOSIS — M2550 Pain in unspecified joint: Secondary | ICD-10-CM | POA: Diagnosis not present

## 2019-09-03 LAB — CBC WITH DIFFERENTIAL/PLATELET
Abs Immature Granulocytes: 0.02 10*3/uL (ref 0.00–0.07)
Basophils Absolute: 0 10*3/uL (ref 0.0–0.1)
Basophils Relative: 1 %
Eosinophils Absolute: 0.4 10*3/uL (ref 0.0–0.5)
Eosinophils Relative: 5 %
HCT: 31 % — ABNORMAL LOW (ref 39.0–52.0)
Hemoglobin: 9.6 g/dL — ABNORMAL LOW (ref 13.0–17.0)
Immature Granulocytes: 0 %
Lymphocytes Relative: 19 %
Lymphs Abs: 1.5 10*3/uL (ref 0.7–4.0)
MCH: 28.3 pg (ref 26.0–34.0)
MCHC: 31 g/dL (ref 30.0–36.0)
MCV: 91.4 fL (ref 80.0–100.0)
Monocytes Absolute: 0.8 10*3/uL (ref 0.1–1.0)
Monocytes Relative: 11 %
Neutro Abs: 4.8 10*3/uL (ref 1.7–7.7)
Neutrophils Relative %: 64 %
Platelets: 211 10*3/uL (ref 150–400)
RBC: 3.39 MIL/uL — ABNORMAL LOW (ref 4.22–5.81)
RDW: 15.5 % (ref 11.5–15.5)
WBC: 7.5 10*3/uL (ref 4.0–10.5)
nRBC: 0 % (ref 0.0–0.2)

## 2019-09-03 LAB — BASIC METABOLIC PANEL
Anion gap: 9 (ref 5–15)
BUN: 33 mg/dL — ABNORMAL HIGH (ref 8–23)
CO2: 24 mmol/L (ref 22–32)
Calcium: 9.3 mg/dL (ref 8.9–10.3)
Chloride: 104 mmol/L (ref 98–111)
Creatinine, Ser: 1.23 mg/dL (ref 0.61–1.24)
GFR calc Af Amer: >60 mL/min (ref >60)
GFR calc non Af Amer: 52 mL/min — ABNORMAL LOW (ref >60)
Glucose, Bld: 92 mg/dL (ref 70–99)
Potassium: 4.4 mmol/L (ref 3.5–5.1)
Sodium: 137 mmol/L (ref 135–145)

## 2019-09-03 LAB — BPAM RBC
Blood Product Expiration Date: 202101242359
Blood Product Expiration Date: 202101242359
ISSUE DATE / TIME: 202012222241
ISSUE DATE / TIME: 202012230454
Unit Type and Rh: 5100
Unit Type and Rh: 5100

## 2019-09-03 LAB — TYPE AND SCREEN
ABO/RH(D): O POS
Antibody Screen: NEGATIVE
Unit division: 0
Unit division: 0

## 2019-09-03 NOTE — Progress Notes (Signed)
OT Cancellation Note  Patient Details Name: KENTREZ CIARAMELLA MRN: [id number] DOB: 09-25-1928   Cancelled Treatment:    Reason Eval/Treat Not Completed: Other (comment).  Pt is returning to [facility].  Spoke to CM; will defer OT intervention to that venue.  Danesha Kirchoff 09/03/2019, 2:44 PM  Karsten Ro, OTR/L Acute Rehabilitation Services 09/03/2019

## 2019-09-03 NOTE — Discharge Summary (Signed)
Discharge Summary  Jose Baldwin [id number] DOB: Jan 28, 1929  PCP: Jose Ruddy, MD  Admit date: 09/01/2019 Discharge date: 09/03/2019  Time spent: 30  mins  Recommendations for Outpatient Follow-up:  1. Follow-up with PCP in 1 week with repeat labs 2. Follow-up with GI as scheduled  Discharge Diagnoses:  Active Hospital Problems   Diagnosis Date Noted  . Severe anemia 09/01/2019    Resolved Hospital Problems  No resolved problems to display.    Discharge Condition: Stable  Diet recommendation: As tolerated  Vitals:   09/02/19 2100 09/03/19 0439  BP: (!) 168/62 122/69  Pulse: 70 71  Resp: 15 20  Temp: 98 F (36.7 C) 98.4 F (36.9 C)  SpO2: 96% 96%    History of present illness:  Jose Duca Coxis an 85 y.o.malewith medical history significant for dementia, Parkinson's disease, prostate cancer, CKD stage III, history of syncope, hypertension. Pt was brought to the hospital from the rehab facility because of hemoglobin of 6.8.was found on routine labs. Patient denies any symptoms at this time. However, he said that he "passed out" about 2 times in the past 2 weeks. Son was contacted, who said that patient has been having recurrent severe anemia requiring blood transfusion for the past 3 years. However, he said that the etiology of his anemia has not been established. He also said that "he never has blood in his stool". In the ED, patient found to have a hemoglobin of 6.8. Stool for occult blood test was negative. Pt admitted for further management.    Today, patient reports feeling better, denies any new complaints.  Spoke to son over the phone, reported her is back to baseline.  Denies any chest pain, shortness of breath, abdominal pain, fever/chills, nausea/vomiting.  Patient stable to be discharged to [facility] Course:  Active Problems:   Severe anemia  Acute on chronic blood loss anemia/anemia of chronic disease Unknown etiology FOBT negative,  no signs of bleeding Anemia panel showed iron 66, TIBC 431, sats 15, ferritin 18 Status post 2U PRBC, hemoglobin currently stable Of note, patient has an appointment with [facility] on 09/10/2019 at 9:30 AM, encouraged to follow-up PCP in 1 week with repeat labs  History of dysphagia Follow-up with GI as above  CKD stage III Creatinine at baseline  HTN Stable Continue Norvasc, clonidine  HLD Continue Crestor  GERD Continue PPI  Parkinsons disease Continue carbidopa-levodopa           Malnutrition Type:      Malnutrition Characteristics:      Nutrition Interventions:      Estimated body mass index is 17.63 kg/m as calculated from the following:   Height as of this encounter: 6\' 1"  (1.854 m).   Weight as of this encounter: 60.6 kg.    Procedures:  None  Consultations:  None  Discharge Exam: BP 122/69 (BP Location: Right Arm)   Pulse 71   Temp 98.4 F (36.9 C)   Resp 20   Ht 6\' 1"  (1.854 m)   Wt 60.6 kg   SpO2 96%   BMI 17.63 kg/m   General: NAD Cardiovascular: S1, S2 present Respiratory: CTA B  Discharge Instructions You were cared for by a hospitalist during your hospital stay. If you have any questions about your discharge medications or the care you received while you were in the hospital after you are discharged, you can call the unit and asked to speak with the hospitalist on call if the hospitalist  that took care of you is not available. Once you are discharged, your primary care physician will handle any further medical issues. Please note that NO REFILLS for any discharge medications will be authorized once you are discharged, as it is imperative that you return to your primary care physician (or establish a relationship with a primary care physician if you do not have one) for your aftercare needs so that they can reassess your need for medications and monitor your lab values.  Discharge Instructions    Diet - low sodium  heart healthy   Complete by: As directed    Increase activity slowly   Complete by: As directed      Allergies as of 09/03/2019   No Known Allergies     Medication List    TAKE these medications   amLODipine 2.5 MG tablet Commonly known as: NORVASC Take 1 tablet (2.5 mg total) by mouth daily.   ascorbic acid 500 MG tablet Commonly known as: VITAMIN C Take 500 mg by mouth daily.   aspirin 81 MG chewable tablet Chew 1 tablet (81 mg total) by mouth daily.   carbidopa-levodopa 25-250 MG tablet Commonly known as: SINEMET IR Take 1 tablet by mouth 3 (three) times daily. 9am, 1pm, 7pm   cloNIDine 0.2 MG tablet Commonly known as: CATAPRES Take 0.2 mg by mouth 3 (three) times daily. 9am, 1pm, 7pm   feeding supplement (ENSURE ENLIVE) Liqd Take 237 mLs by mouth 2 (two) times daily between meals.   guaiFENesin 600 MG 12 hr tablet Commonly known as: MUCINEX Take 600 mg by mouth 2 (two) times daily.   multivitamin with minerals Tabs tablet Take 1 tablet by mouth daily.   omeprazole 40 MG capsule Commonly known as: PRILOSEC Take 40 mg by mouth daily.   polyethylene glycol 17 g packet Commonly known as: MIRALAX / GLYCOLAX Take 17 g by mouth daily as needed for mild constipation.   QUEtiapine 50 MG tablet Commonly known as: SEROQUEL Take 50 mg by mouth daily at 6 PM.   rosuvastatin 40 MG tablet Commonly known as: CRESTOR Take 1 tablet (40 mg total) by mouth daily at 6 PM.   thiamine 100 MG tablet Commonly known as: VITAMIN B-1 Take 100 mg by mouth daily. Take at 1 PM   Vitamin D3 1.25 MG (50000 UT) Caps Take 1 capsule by mouth every 30 (thirty) days.   zinc sulfate 220 (50 Zn) MG capsule Take 220 mg by mouth every other day.      No Known Allergies Follow-up Information    Jose Ruddy, MD. Schedule an appointment as soon as possible for a visit in 1 week(s).   Specialty: Family Medicine Contact information: [place]  [address] [phone]            The results of significant diagnostics from this hospitalization (including imaging, microbiology, ancillary and laboratory) are listed below for reference.    Significant Diagnostic Studies: No results found.  Microbiology: Recent Results (from the past 240 hour(s))  SARS CORONAVIRUS 2 (TAT 6-24 HRS) Nasopharyngeal Nasopharyngeal Swab     Status: None   Collection Time: 09/01/19  5:45 PM   Specimen: Nasopharyngeal Swab  Result Value Ref Range Status   SARS Coronavirus 2 NEGATIVE NEGATIVE Final    Comment: (NOTE) SARS-CoV-2 target nucleic acids are NOT DETECTED. The SARS-CoV-2 RNA is generally detectable in upper and lower respiratory specimens during the acute phase of infection. Negative results do not preclude SARS-CoV-2  infection, do not rule out co-infections with other pathogens, and should not be used as the sole basis for treatment or other patient management decisions. Negative results must be combined with clinical observations, patient history, and epidemiological information. The expected result is Negative. Fact Sheet for Patients: SugarRoll.be Fact Sheet for Healthcare Providers: https://www.woods-mathews.com/ This test is not yet approved or cleared by the [place] FDA and  has been authorized for detection and/or diagnosis of SARS-CoV-2 by FDA under an Emergency Use Authorization (EUA). This EUA will remain  in effect (meaning this test can be used) for the duration of the COVID-19 declaration under Section 56 4(b)(1) of the Act, 21 U.S.C. section 360bbb-3(b)(1), unless the authorization is terminated or revoked sooner. Performed at [facility], [place] [address] [place], [place] [address]   MRSA PCR Screening     Status: None   Collection Time: 09/02/19  6:37 PM   Specimen: Nasal Mucosa; Nasopharyngeal  Result Value Ref Range Status   MRSA by PCR NEGATIVE NEGATIVE Final     Comment:        The GeneXpert MRSA Assay (FDA approved for NASAL specimens only), is one component of a comprehensive MRSA colonization surveillance program. It is not intended to diagnose MRSA infection nor to guide or monitor treatment for MRSA infections. Performed at [facility], [place] [address] [place], [place] [address]      Labs: Basic Metabolic Panel: Recent Labs  Lab 09/01/19 1645 09/02/19 0422 09/03/19 0523  NA 137 138 137  K 4.2 4.6 4.4  CL 103 107 104  CO2 25 21* 24  GLUCOSE 108* 101* 92  BUN 39* 36* 33*  CREATININE 1.61* 1.54* 1.23  CALCIUM 9.0 8.9 9.3   Liver Function Tests: No results for input(s): AST, ALT, ALKPHOS, BILITOT, PROT, ALBUMIN in the last 168 hours. No results for input(s): LIPASE, AMYLASE in the last 168 hours. No results for input(s): AMMONIA in the last 168 hours. CBC: Recent Labs  Lab 09/01/19 1645 09/02/19 0422 09/03/19 0523  WBC 6.1 7.9 7.5  NEUTROABS 4.0  --  4.8  HGB 6.8* 8.1* 9.6*  HCT 23.0* 27.1* 31.0*  MCV 89.8 92.8 91.4  PLT 247 209 211   Cardiac Enzymes: No results for input(s): CKTOTAL, CKMB, CKMBINDEX, TROPONINI in the last 168 hours. BNP: BNP (last 3 results) No results for input(s): BNP in the last 8760 hours.  ProBNP (last 3 results) No results for input(s): PROBNP in the last 8760 hours.  CBG: No results for input(s): GLUCAP in the last 168 hours.     Signed:  Alma Friendly, MD [facility] 09/03/2019, 12:42 PM

## 2019-09-03 NOTE — Care Management Obs Status (Signed)
[facility] NOTIFICATION   Patient Details  Name: Jose Baldwin MRN: [id number] Date of Birth: [birth year]   Medicare Observation Status Notification Given:  Yes    Purcell Mouton, RN 09/03/2019, 12:38 PM

## 2019-09-03 NOTE — Care Management CC44 (Signed)
Condition Code 44 Documentation Completed  Patient Details  Name: NAOD RUANE MRN: [id number] Date of Birth: 02-Dec-1928   Condition Code 44 given:  Yes Patient signature on Condition Code 44 notice:  Yes Documentation of 2 MD's agreement:  Yes Code 44 added to claim:  Yes    Purcell Mouton, RN 09/03/2019, 12:38 PM

## 2019-09-03 NOTE — Progress Notes (Signed)
PT Cancellation Note  Patient Details Name: Jose Baldwin MRN: [id number] DOB: 12/18/1928   Cancelled Treatment:    Reason Eval/Treat Not Completed: Other (comment)  Noted discharge orders and summary in chart.  Spoke with RN and reports has been called to rehab and transport called.  Pt was OOB earlier today with nursing.  Will hold PT at this time and follow up if pt still in hospital tomorrow as able.   Maggie Font, PT Acute Rehab Services Pager [phone] [facility] 09/03/2019, 2:44 PM

## 2019-09-03 NOTE — TOC Progression Note (Signed)
Transition of Care [facility]) - Progression Note    Patient Details  Name: Jose Baldwin MRN: [id number] Date of Birth: Aug 15, 1929  Transition of [facility]) CM/SW Contact  Purcell Mouton, RN Phone Number: 09/03/2019, 1:11 PM  Clinical Narrative:    Pt returning to [facility] Please call report to [phone].         Expected Discharge Plan and Services           Expected Discharge Date: 09/03/19                                     Social Determinants of Health (SDOH) Interventions    Readmission Risk Interventions Readmission Risk Prevention Plan 05/23/2019  Transportation Screening Complete  PCP or Specialist Appt within 3-5 Days Complete  HRI or [place] Complete  Social Work Consult for [place] Planning/Counseling Complete  Palliative Care Screening Not Applicable  Medication Review [employer]) Complete  Some recent data might be hidden

## 2019-09-03 NOTE — Progress Notes (Signed)
Report called to [place].  

## 2019-09-04 DIAGNOSIS — J069 Acute upper respiratory infection, unspecified: Secondary | ICD-10-CM | POA: Diagnosis not present

## 2019-09-05 ENCOUNTER — Encounter: Payer: Self-pay | Admitting: Internal Medicine

## 2019-09-07 ENCOUNTER — Encounter: Payer: Self-pay | Admitting: Internal Medicine

## 2019-09-07 ENCOUNTER — Non-Acute Institutional Stay (SKILLED_NURSING_FACILITY): Payer: Medicare Other | Admitting: Internal Medicine

## 2019-09-07 DIAGNOSIS — D649 Anemia, unspecified: Secondary | ICD-10-CM | POA: Diagnosis not present

## 2019-09-07 DIAGNOSIS — D508 Other iron deficiency anemias: Secondary | ICD-10-CM

## 2019-09-07 DIAGNOSIS — E785 Hyperlipidemia, unspecified: Secondary | ICD-10-CM

## 2019-09-07 DIAGNOSIS — G20 Parkinson's disease: Secondary | ICD-10-CM | POA: Diagnosis not present

## 2019-09-07 DIAGNOSIS — I129 Hypertensive chronic kidney disease with stage 1 through stage 4 chronic kidney disease, or unspecified chronic kidney disease: Secondary | ICD-10-CM | POA: Diagnosis not present

## 2019-09-07 DIAGNOSIS — I10 Essential (primary) hypertension: Secondary | ICD-10-CM | POA: Diagnosis not present

## 2019-09-07 NOTE — Progress Notes (Signed)
: Provider:  Hennie Duos., MD Location:  [facility] Room Number: 110-P Place of Service:  SNF ([phone])  PCP: Billie Ruddy, MD Patient Care Team: Billie Ruddy, MD as PCP - General (Family Medicine)  Extended Emergency Contact Information Primary Emergency Contact: Orpah Melter Address: [place], [place] [address] [name] of [place] Phone: [phone] Mobile Phone: [phone] Relation: Son     Allergies: Patient has no known allergies.  Chief Complaint  Patient presents with  . Readmit To SNF    Readmission to [facility] SNF    HPI: Patient is an 85 y.o. male with dementia, Parkinson's disease, prostate cancer, CKD stage III, history of syncope, hypertension, who was brought to the hospital from SNF because of hemoglobin of 6.8.  Patient denies any symptoms except for he said he did pass out 2 times in the past 2 weeks.  Son was contacted who said that patient has been having recurrent severe anemia requiring blood transfusion for the past 3 years, but the etiology of the anemia is unknown.  Son also said he never had blood in his stool.  In the ED patient was found to have a hemoglobin of 6.8 and stool Hemoccult was negative.  Patient is admitted to [facility] from 12/22-24Where he was transfused with 2 units PRBC.  Patient is admitted back to skilled nursing facility for continued OT/PT.  While at skilled nursing facility patient will be followed for hypertension treated with Norvasc and clonidine, hyperlipidemia treated with Crestor and Parkinson's disease treated with Sinemet.  Past Medical History:  Diagnosis Date  . Alzheimer's dementia ([place])   . Hypertension   . Parkinson disease ([place])   . Prostate cancer ([place])   . Renal disorder   . Syncope     Past Surgical History:  Procedure Laterality Date  . BACK SURGERY    . IR VERTEBROPLASTY CERV/THOR BX INC UNI/BIL INC/INJECT/IMAGING   01/31/2018  . IR VERTEBROPLASTY CERV/THOR BX INC UNI/BIL INC/INJECT/IMAGING  03/24/2019    Allergies as of 09/07/2019   No Known Allergies     Medication List       Accurate as of September 07, 2019 10:02 AM. If you have any questions, ask your nurse or doctor.        STOP taking these medications   zinc sulfate 220 (50 Zn) MG capsule Stopped by: Inocencio Homes, MD     TAKE these medications   amLODipine 2.5 MG tablet Commonly known as: NORVASC Take 1 tablet (2.5 mg total) by mouth daily.   ascorbic acid 500 MG tablet Commonly known as: VITAMIN C Take 500 mg by mouth daily.   aspirin 81 MG chewable tablet Chew 1 tablet (81 mg total) by mouth daily.   carbidopa-levodopa 25-250 MG tablet Commonly known as: SINEMET IR Take 1 tablet by mouth 3 (three) times daily. 9am, 1pm, 7pm   cloNIDine 0.2 MG tablet Commonly known as: CATAPRES Take 0.2 mg by mouth 3 (three) times daily. 9am, 1pm, 7pm   feeding supplement (ENSURE ENLIVE) Liqd Take 237 mLs by mouth 2 (two) times daily between meals.   guaiFENesin 600 MG 12 hr tablet Commonly known as: MUCINEX Take 600 mg by mouth 2 (two) times daily.   multivitamin with minerals Tabs tablet Take 1 tablet by mouth daily.   omeprazole 40 MG capsule Commonly known as: PRILOSEC Take 40 mg  by mouth daily.   polyethylene glycol 17 g packet Commonly known as: MIRALAX / GLYCOLAX Take 17 g by mouth daily as needed for mild constipation.   QUEtiapine 50 MG tablet Commonly known as: SEROQUEL Take 50 mg by mouth daily at 6 PM.   rosuvastatin 40 MG tablet Commonly known as: CRESTOR Take 1 tablet (40 mg total) by mouth daily at 6 PM.   thiamine 100 MG tablet Commonly known as: VITAMIN B-1 Take 100 mg by mouth daily. Take at 1 PM   Vitamin D3 1.25 MG (50000 UT) Caps Take 1 capsule by mouth every 30 (thirty) days.       No orders of the defined types were placed in this encounter.   Immunization History  Administered Date(s)  Administered  . Fluad Quad(high Dose 65+) 05/23/2019  . Influenza, High Dose Seasonal PF 06/26/2017, 06/17/2018  . Influenza-Unspecified 06/22/2016, 06/26/2017  . Pneumococcal Polysaccharide-23 01/07/2015, 05/11/2017  . Zoster Recombinat (Shingrix) 12/11/2017, 03/18/2018    Social History   Tobacco Use  . Smoking status: Former Smoker    Packs/day: 2.00    Years: 30.00    Pack years: 60.00    Types: Cigarettes    Quit date: 1990    Years since quitting: 31.0  . Smokeless tobacco: Never Used  Substance Use Topics  . Alcohol use: Yes    Alcohol/week: 14.0 standard drinks    Types: 14 Cans of beer per week    Family history is   Family History  Problem Relation Age of Onset  . Heart attack Father 13      Review of Systems   GENERAL:  no fevers, fatigue, appetite changes SKIN: No itching, or rash EYES: No eye pain, redness, discharge EARS: No earache, tinnitus, change in hearing NOSE: No congestion, drainage or bleeding  MOUTH/THROAT: No mouth or tooth pain, No sore throat RESPIRATORY: No cough, wheezing, SOB CARDIAC: No chest pain, palpitations, lower extremity edema  GI: No abdominal pain, No N/V/D or constipation, No heartburn or reflux  GU: No dysuria, frequency or urgency, or incontinence  MUSCULOSKELETAL: No unrelieved bone/joint pain NEUROLOGIC: No headache, dizziness or focal weakness PSYCHIATRIC: No c/o anxiety or sadness   Vitals:   09/07/19 0840  BP: 106/63  Pulse: 84  Resp: 18  Temp: 97.6 F (36.4 C)  SpO2: 97%    SpO2 Readings from Last 1 Encounters:  09/07/19 97%   Body mass index is 17.63 kg/m.     Physical Exam  GENERAL APPEARANCE: Alert, conversant,  No acute distress.  SKIN: No diaphoresis rash HEAD: Normocephalic, atraumatic  EYES: Conjunctiva/lids clear. Pupils round, reactive. EOMs intact.  EARS: External exam WNL, canals clear. Hearing grossly normal.  NOSE: No deformity or discharge.  MOUTH/THROAT: Lips w/o lesions    RESPIRATORY: Breathing is even, unlabored. Lung sounds are clear   CARDIOVASCULAR: Heart RRR no murmurs, rubs or gallops. No peripheral edema.   GASTROINTESTINAL: Abdomen is soft, non-tender, not distended w/ normal bowel sounds. GENITOURINARY: Bladder non tender, not distended  MUSCULOSKELETAL: No abnormal joints or musculature NEUROLOGIC:  Cranial nerves 2-12 grossly intact. Moves all extremities  PSYCHIATRIC: Mood and affect appropriate with dementia, no behavioral issues  Patient Active Problem List   Diagnosis Date Noted  . Severe anemia 09/01/2019  . Dementia with behavioral disturbance ([place]) 05/30/2019  . Hyperlipidemia 05/30/2019  . Bradycardia 05/22/2019  . Goals of care, counseling/discussion   . Palliative care by specialist   . Uncontrolled hypertension 05/21/2019  . Normocytic anemia 05/21/2019  .  CKD (chronic kidney disease), stage III 05/21/2019  . Physical deconditioning 05/21/2019  . Hematuria 05/21/2019  . UTI (urinary tract infection) 05/20/2019  . Hypokalemia 04/20/2019  . Hypomagnesemia 04/20/2019  . Parkinson disease ([place]) 04/20/2019  . Cardiac arrhythmia 04/20/2019  . Acute renal failure superimposed on stage 3 chronic kidney disease ([place]) 04/20/2019  . Protein-calorie malnutrition, severe 03/31/2019  . Cerebral embolism with cerebral infarction 03/30/2019  . POTS (postural orthostatic tachycardia syndrome) 03/28/2019  . HTN (hypertension) 02/04/2018  . Prostate cancer ([place]) 02/04/2018  . Orthostatic hypotension 02/04/2018  . Iron deficiency anemia 02/04/2018  . Syncope 02/02/2018      Labs reviewed: Basic Metabolic Panel:    Component Value Date/Time   NA 137 09/03/2019 0523   NA 143 06/26/2019 0000   K 4.4 09/03/2019 0523   CL 104 09/03/2019 0523   CO2 24 09/03/2019 0523   GLUCOSE 92 09/03/2019 0523   BUN 33 (H) 09/03/2019 0523   BUN 32 (A) 06/26/2019 0000   CREATININE 1.23 09/03/2019 0523   CALCIUM 9.3 09/03/2019 0523   PROT 5.6 (L)  05/20/2019 1140   ALBUMIN 2.9 (L) 05/20/2019 1140   AST 10 (L) 05/20/2019 1140   ALT 5 05/20/2019 1140   ALKPHOS 56 05/20/2019 1140   BILITOT 0.3 05/20/2019 1140   GFRNONAA 52 (L) 09/03/2019 0523   GFRAA >60 09/03/2019 0523    Recent Labs    04/22/19 0441 04/23/19 0428 05/20/19 1900 05/22/19 0110 09/01/19 1645 09/02/19 0422 09/03/19 0523  NA 142 143  --    < > 137 138 137  K 3.0* 4.0  --   --  4.2 4.6 4.4  CL 104 110  --   --  103 107 104  CO2 29 26  --   --  25 21* 24  GLUCOSE 104* 102*  --   --  108* 101* 92  BUN 28* 28*  --    < > 39* 36* 33*  CREATININE 1.84* 1.73*  --    < > 1.61* 1.54* 1.23  CALCIUM 7.9* 7.9*  --   --  9.0 8.9 9.3  MG 1.7 1.7 1.8  --   --   --   --    < > = values in this interval not displayed.   Liver Function Tests: Recent Labs    03/30/19 0432 04/20/19 1701 05/20/19 1140  AST 16 35 10*  ALT 5 17 5   ALKPHOS 68 80 56  BILITOT 0.5 0.5 0.3  PROT 5.5* 6.3* 5.6*  ALBUMIN 3.2* 3.3* 2.9*   No results for input(s): LIPASE, AMYLASE in the last 8760 hours. No results for input(s): AMMONIA in the last 8760 hours. CBC: Recent Labs    06/01/19 1957 06/26/19 0000 09/01/19 1645 09/02/19 0422 09/03/19 0523  WBC  --  7.1 6.1 7.9 7.5  NEUTROABS  --  5 4.0  --  4.8  HGB   < > 10.1* 6.8* 8.1* 9.6*  HCT   < > 30* 23.0* 27.1* 31.0*  MCV  --   --  89.8 92.8 91.4  PLT   < > 136* 247 209 211   < > = values in this interval not displayed.   Lipid Recent Labs    03/30/19 0432  CHOL 207*  HDL 62  LDLCALC 131*  TRIG 70    Cardiac Enzymes: No results for input(s): CKTOTAL, CKMB, CKMBINDEX, TROPONINI in the last 8760 hours. BNP: No results for input(s): BNP in the last 8760 hours.  No results found for: [facility] Lab Results  Component Value Date   HGBA1C 4.9 06/26/2019   Lab Results  Component Value Date   TSH 1.798 05/21/2019   Lab Results  Component Value Date   [id number] 03/28/2019   Lab Results  Component Value Date    FOLATE 20.7 03/28/2019   Lab Results  Component Value Date   IRON 66 09/01/2019   TIBC 431 09/01/2019   FERRITIN 18 (L) 09/01/2019    Imaging and Procedures obtained prior to SNF admission: No results found.   Not all labs, radiology exams or other studies done during hospitalization come through on my EPIC note; however they are reviewed by me.    Assessment and Plan  Acute on chronic blood loss anemia/anemia of chronic disease-unknown etiology; FOBT negative; anemia panel-iron 66, TIBC 431, sat 15, ferritin 18; status post 2 units PRBC; patient with appointment with [facility] on 12/31 SNF-admit for OT/PT; DC hemoglobin 9.6, follow-up CBC  Hypertension SNF-controlled; continue Norvasc 2.5 mg daily and clonidine 0.2 mg 3 times daily  Hyperlipidemia SNF-not stated as uncontrolled; continue Crestor 40 mg daily  Parkinson's SNF-.  Stable; continue Sinemet IR 25-251 p.o. 3 times daily    Hennie Duos, MD

## 2019-09-08 DIAGNOSIS — R1312 Dysphagia, oropharyngeal phase: Secondary | ICD-10-CM | POA: Diagnosis not present

## 2019-09-08 DIAGNOSIS — D509 Iron deficiency anemia, unspecified: Secondary | ICD-10-CM | POA: Diagnosis not present

## 2019-09-08 DIAGNOSIS — I69328 Other speech and language deficits following cerebral infarction: Secondary | ICD-10-CM | POA: Diagnosis not present

## 2019-09-08 DIAGNOSIS — R2681 Unsteadiness on feet: Secondary | ICD-10-CM | POA: Diagnosis not present

## 2019-09-10 ENCOUNTER — Ambulatory Visit: Payer: Medicare Other | Admitting: Physician Assistant

## 2019-09-12 ENCOUNTER — Encounter: Payer: Self-pay | Admitting: Internal Medicine

## 2019-09-15 ENCOUNTER — Other Ambulatory Visit (HOSPITAL_COMMUNITY): Payer: Self-pay | Admitting: *Deleted

## 2019-09-15 DIAGNOSIS — H353231 Exudative age-related macular degeneration, bilateral, with active choroidal neovascularization: Secondary | ICD-10-CM | POA: Diagnosis not present

## 2019-09-15 DIAGNOSIS — R1310 Dysphagia, unspecified: Secondary | ICD-10-CM

## 2019-09-15 DIAGNOSIS — H353211 Exudative age-related macular degeneration, right eye, with active choroidal neovascularization: Secondary | ICD-10-CM | POA: Diagnosis not present

## 2019-09-22 ENCOUNTER — Other Ambulatory Visit: Payer: Self-pay

## 2019-09-22 ENCOUNTER — Ambulatory Visit (HOSPITAL_COMMUNITY)
Admission: RE | Admit: 2019-09-22 | Discharge: 2019-09-22 | Disposition: A | Discharge status: Home / Self Care | Payer: Medicare Other | Source: Ambulatory Visit | Attending: Physician Assistant | Admitting: Physician Assistant

## 2019-09-22 DIAGNOSIS — R1310 Dysphagia, unspecified: Secondary | ICD-10-CM | POA: Diagnosis not present

## 2019-09-22 DIAGNOSIS — K219 Gastro-esophageal reflux disease without esophagitis: Secondary | ICD-10-CM | POA: Diagnosis not present

## 2019-09-25 ENCOUNTER — Encounter: Payer: Self-pay | Admitting: Internal Medicine

## 2019-09-25 NOTE — Progress Notes (Signed)
Location:    [facility] Room Number: 215/D Place of Service:  SNF [phone]) Provider:  Darrin Nipper, MD  Patient Care Team: Billie Ruddy, MD as PCP - General (Family Medicine)  Extended Emergency Contact Information Primary Emergency Contact: Orpah Melter Address: [place],  [address] [name] Phone: [phone] Mobile Phone: [phone] Relation: Son  Code Status:  DNR Goals of care: Advanced Directive information Advanced Directives 09/25/2019  Does Patient Have a Medical Advance Directive? Yes  Type of Paramedic of [place];Out of facility DNR (pink MOST or yellow form)  Does patient want to make changes to medical advance directive? No - Patient declined  Copy of [place] in Chart? Yes - validated most recent copy scanned in chart (See row information)  Would patient like information on creating a medical advance directive? -  Pre-existing out of facility DNR order (yellow form or pink MOST form) Yellow form placed in chart (order not valid for inpatient use)     Chief Complaint  Patient presents with   Follow-up    Follow Up Visit    HPI:  Pt is a 86 y.o. male seen today for an acute visit for    Past Medical History:  Diagnosis Date   Alzheimer's dementia ([place])    Hypertension    Parkinson disease ([place])    Prostate cancer ([place])    Renal disorder    Syncope    Past Surgical History:  Procedure Laterality Date   BACK SURGERY     IR VERTEBROPLASTY CERV/THOR BX INC UNI/BIL INC/INJECT/IMAGING  01/31/2018   IR VERTEBROPLASTY CERV/THOR BX INC UNI/BIL INC/INJECT/IMAGING  03/24/2019    No Known Allergies  Outpatient Encounter Medications as of 09/25/2019  Medication Sig   amLODipine (NORVASC) 2.5 MG tablet Take 1 tablet (2.5 mg total) by mouth daily.   ascorbic acid (VITAMIN C) 500 MG tablet Take 500 mg by  mouth daily.   aspirin 81 MG chewable tablet Chew 1 tablet (81 mg total) by mouth daily.   carbidopa-levodopa (SINEMET IR) 25-250 MG tablet Take 1 tablet by mouth 3 (three) times daily. 9am, 1pm, 7pm   Cholecalciferol (VITAMIN D3) 1.25 MG (50000 UT) CAPS Take 1 capsule by mouth every 30 (thirty) days.   cloNIDine (CATAPRES) 0.2 MG tablet Take 0.2 mg by mouth 3 (three) times daily. 9am, 1pm, 7pm   feeding supplement, ENSURE ENLIVE, (ENSURE ENLIVE) LIQD Take 237 mLs by mouth 2 (two) times daily between meals.   guaiFENesin (MUCINEX) 600 MG 12 hr tablet Take 600 mg by mouth 2 (two) times daily.   Infant Care Products (BABY SHAMPOO EX) Apply topically. Baby shampo ( less than a dime size ) with warm water can be use for eye hygiene   Multiple Vitamin (MULTIVITAMIN WITH MINERALS) TABS tablet Take 1 tablet by mouth daily.   NON FORMULARY Diet Order: Pureed Diet, no therapeutic restrictions   omeprazole (PRILOSEC) 40 MG capsule Take 40 mg by mouth daily.   polyethylene glycol (MIRALAX / GLYCOLAX) 17 g packet Take 17 g by mouth daily as needed for mild constipation.   QUEtiapine (SEROQUEL) 50 MG tablet Take 50 mg by mouth daily at 6 PM.   rosuvastatin (CRESTOR) 40 MG tablet Take 1 tablet (40 mg total) by mouth daily at 6 PM.   thiamine (VITAMIN B-1) 100 MG  tablet Take 100 mg by mouth daily. Take at 1 PM   zinc sulfate 220 (50 Zn) MG capsule Take 220 mg by mouth every other day.   No facility-administered encounter medications on file as of 09/25/2019.    Review of Systems  Immunization History  Administered Date(s) Administered   Fluad Quad(high Dose 65+) 05/23/2019   Influenza, High Dose Seasonal PF 06/26/2017, 06/17/2018   Influenza-Unspecified 06/22/2016, 06/26/2017   Pneumococcal Polysaccharide-23 01/07/2015, 05/11/2017   Zoster Recombinat (Shingrix) 12/11/2017, 03/18/2018   Pertinent  Health Maintenance Due  Topic Date Due   PNA vac Low Risk Adult (2 of 2 - PCV13)  05/11/2018   INFLUENZA VACCINE  Completed   No flowsheet data found. Functional Status Survey:    Vitals:   09/25/19 0942  BP: 140/68  Pulse: 74  Resp: 18  Temp: 97.8 F (36.6 C)  TempSrc: Oral  SpO2: 97%  Weight: 130 lb 3.2 oz (59.1 kg)  Height: 6\' 1"  (1.854 m)   Body mass index is 17.18 kg/m. Physical Exam  Labs reviewed: Recent Labs    04/22/19 0441 04/22/19 0441 04/23/19 0428 05/20/19 1140 05/20/19 1900 05/22/19 0110 09/01/19 1645 09/02/19 0422 09/03/19 0523  NA 142   < > 143   < >  --    < > 137 138 137  K 3.0*   < > 4.0   < >  --    < > 4.2 4.6 4.4  CL 104   < > 110   < >  --    < > 103 107 104  CO2 29   < > 26   < >  --    < > 25 21* 24  GLUCOSE 104*   < > 102*   < >  --    < > 108* 101* 92  BUN 28*   < > 28*   < >  --    < > 39* 36* 33*  CREATININE 1.84*   < > 1.73*   < >  --    < > 1.61* 1.54* 1.23  CALCIUM 7.9*   < > 7.9*   < >  --    < > 9.0 8.9 9.3  MG 1.7  --  1.7  --  1.8  --   --   --   --    < > = values in this interval not displayed.   Recent Labs    03/30/19 0432 04/20/19 1701 05/20/19 1140  AST 16 35 10*  ALT 5 17 5   ALKPHOS 68 80 56  BILITOT 0.5 0.5 0.3  PROT 5.5* 6.3* 5.6*  ALBUMIN 3.2* 3.3* 2.9*   Recent Labs    06/01/19 1957 06/26/19 0000 09/01/19 1645 09/02/19 0422 09/03/19 0523  WBC   < > 7.1 6.1 7.9 7.5  NEUTROABS  --  5 4.0  --  4.8  HGB   < > 10.1* 6.8* 8.1* 9.6*  HCT   < > 30* 23.0* 27.1* 31.0*  MCV   < >  --  89.8 92.8 91.4  PLT   < > 136* 247 209 211   < > = values in this interval not displayed.   Lab Results  Component Value Date   TSH 1.798 05/21/2019   Lab Results  Component Value Date   HGBA1C 4.9 06/26/2019   Lab Results  Component Value Date   CHOL 207 (H) 03/30/2019   HDL 62 03/30/2019   LDLCALC 131 (H) 03/30/2019  TRIG 70 03/30/2019   CHOLHDL 3.3 03/30/2019    Significant Diagnostic Results in last 30 days:  Leanne Chang OP MEDICARE SPEECH PATH  Result Date: 09/22/2019 Objective  Swallowing Evaluation: Type of Study: MBS-Modified Barium Swallow Study  Patient Details Name: Jose Baldwin MRN: [id number] Date of Birth: October 31, 1928 Today's Date: 09/22/2019 Time: SLP Start Time (ACUTE ONLY): 1341 -SLP Stop Time (ACUTE ONLY): 1419 SLP Time Calculation (min) (ACUTE ONLY): 38 min Past Medical History: Past Medical History: Diagnosis Date  Alzheimer's dementia ([place])   Hypertension   Parkinson disease ([place])   Prostate cancer ([place])   Renal disorder   Syncope  Past Surgical History: Past Surgical History: Procedure Laterality Date  BACK SURGERY    IR VERTEBROPLASTY CERV/THOR BX INC UNI/BIL INC/INJECT/IMAGING  01/31/2018  IR VERTEBROPLASTY CERV/THOR BX INC UNI/BIL INC/INJECT/IMAGING  03/24/2019 HPI: Mr Cusato is an 86 yo male referred for MBS.  Pt PMH + for prostate cancer, Parkinson's - [facility], syncope, GERD, CVA, CKD 3, dementia, and HTN.  Pt has been having dysphagia per [facility] notes concerning for reflux and pharyngoesophageal dysphagia most notably over the last 3 weeks.  He had been diagnosed with silent aspiration after speech eval - but no report available.  Diet is changed from dys3 to puree and pt admits to odynophagia.  Pna noted at end of October 2020 via ER visit.  MBS ordere. Pt  was started on omeprazole 40 MG daily o 12/17 for GERD.  07/25/2019 hospital admit with vomiting/nausea concern for aspiration pneumonitis or pna.  Pt denies improvement with swallowing/coughing since being on a PPI since 08/2019. He reports coughing with food and drink but states her wants to eat regardless.  At times, he will expectorate food and also coughs up phlegm.  Reports he enjoys eating and his goal is to "get rid of this mess" - his swallowing issues.  He denies requiring heimlich.  No data recorded Assessment / Plan / Recommendation CHL IP CLINICAL IMPRESSIONS 09/22/2019 Clinical Impression Suboptimal positioning as pt was kyphotic and leaning right with upper body to left.  View at times was  difficult.  Pt presents with moderate oral and mild pharyngeal dysphagia.  Decreased oral/lingual coordination results in decreased propulsion, delayed oral transiting and lingual pumping. Pt needed verbal cues to masticate cracker well as he was essentially trying to swallow it whole.  Piecemealing with large boluses observed which is functional for pt.  Delay in oral transiting was much more pronounced with solids than liquids.  NO aspiration observed but mild laryngeal penetration with thin liquid x1 of 4 boluses.  Cued cough did not fully clear penetrates.  Pharyngeal clearance overall was better with puree/solid than liquids.   Thus suspect pressure issue from esophagus causing secondary liquid residuals in pharynx. Upon esophageal sweep, pt appears with significant stasis throughout without ANY awareness.  SLP suspects pt's kyphosis negatively impacts esophageal clearance and the esophagus may be his primary area of deficits/source of deficits.  Note per GI, pt not a candidate for endoscopy thus suspect mitigation indicated. Advised pt to consume several small meals/daily and stay sitting upright after eating.  Also advised he drink water with his meals.  Thanks for this referral of this kind gentleman. Of note, pt did not cough during the MBS.     SLP Visit Diagnosis Dysphagia, oropharyngeal phase (R13.12) Attention and concentration deficit following -- Frontal lobe and executive function deficit following -- Impact on safety and function Mild aspiration risk  CHL IP TREATMENT RECOMMENDATION 03/31/2019 Treatment Recommendations Defer treatment plan to f/u with SLP   Prognosis 03/31/2019 Prognosis for Safe Diet Advancement Good Barriers to Reach Goals Cognitive deficits Barriers/Prognosis Comment -- CHL IP DIET RECOMMENDATION 09/22/2019 SLP Diet Recommendations Defer to referring MD/facility, softer foods/thin likely tolerated best Liquid Administration via Cup;Straw Medication Administration Whole meds with  puree - start and follow with water Compensations Slow rate;Small sips/bites;Follow solids with liquid;Clear throat intermittently; Small frequent meals Postural Changes Remain semi-upright after after feeds/meals (Comment);Seated upright at 90 degrees   No flowsheet data found.  CHL IP FOLLOW UP RECOMMENDATIONS 03/31/2019 Follow up Recommendations Home health SLP;24 hour supervision/assistance   No flowsheet data found.     CHL IP ORAL PHASE 09/22/2019 Oral Phase Impaired Oral - Pudding Teaspoon -- Oral - Pudding Cup -- Oral - Honey Teaspoon -- Oral - Honey Cup -- Oral - Nectar Teaspoon Weak lingual manipulation;Delayed oral transit Oral - Nectar Cup -- Oral - Nectar Straw Weak lingual manipulation;Decreased bolus cohesion Oral - Thin Teaspoon Weak lingual manipulation;Reduced posterior propulsion;Delayed oral transit Oral - Thin Cup -- Oral - Thin Straw Weak lingual manipulation;Reduced posterior propulsion;Piecemeal swallowing;Delayed oral transit;Premature spillage Oral - Puree Weak lingual manipulation;Lingual pumping;Reduced posterior propulsion;Piecemeal swallowing;Delayed oral transit;Decreased bolus cohesion;Premature spillage Oral - Mech Soft Impaired mastication;Weak lingual manipulation;Lingual pumping;Reduced posterior propulsion;Delayed oral transit;Decreased bolus cohesion Oral - Regular -- Oral - Multi-Consistency -- Oral - Pill -- Oral Phase - Comment --  CHL IP PHARYNGEAL PHASE 09/22/2019 Pharyngeal Phase Impaired Pharyngeal- Pudding Teaspoon -- Pharyngeal -- Pharyngeal- Pudding Cup -- Pharyngeal -- Pharyngeal- Honey Teaspoon -- Pharyngeal -- Pharyngeal- Honey Cup -- Pharyngeal -- Pharyngeal- Nectar Teaspoon WFL;Pharyngeal residue - valleculae Pharyngeal Material does not enter airway Pharyngeal- Nectar Cup -- Pharyngeal -- Pharyngeal- Nectar Straw WFL;Pharyngeal residue - valleculae Pharyngeal Material does not enter airway Pharyngeal- Thin Teaspoon Pharyngeal residue - valleculae Pharyngeal Material  does not enter airway Pharyngeal- Thin Cup -- Pharyngeal -- Pharyngeal- Thin Straw Reduced airway/laryngeal closure;Penetration/Aspiration during swallow;Pharyngeal residue - valleculae;Pharyngeal residue - pyriform Pharyngeal -- Pharyngeal- Puree WFL Pharyngeal Material does not enter airway Pharyngeal- Mechanical Soft WFL;Pharyngeal residue - valleculae Pharyngeal Material does not enter airway Pharyngeal- Regular -- Pharyngeal -- Pharyngeal- Multi-consistency -- Pharyngeal -- Pharyngeal- Pill -- Pharyngeal -- Pharyngeal Comment --  CHL IP CERVICAL ESOPHAGEAL PHASE 09/22/2019 Cervical Esophageal Phase Impaired Pudding Teaspoon -- Pudding Cup -- Honey Teaspoon -- Honey Cup -- Nectar Teaspoon -- Nectar Cup -- Nectar Straw -- Thin Teaspoon -- Thin Cup -- Thin Straw -- Puree -- Mechanical Soft -- Regular -- Multi-consistency -- Pill -- Cervical Esophageal Comment See notes Macario Golds 09/22/2019, 4:07 PM  Kathleen Lime, MS [facility] SLP Acute Rehab Services Office [phone]           CLINICAL DATA:  Dysphagia. Cough/GE reflux disease/other secondary diagnosis EXAM: MODIFIED BARIUM SWALLOW TECHNIQUE: Different consistencies of barium were administered orally to the patient by the Speech Pathologist. Imaging of the pharynx was performed in the lateral projection. The radiologist was present in the fluoroscopy room for this study, providing personal supervision. FLUOROSCOPY TIME:  Fluoroscopy Time: 1 minutes 48 seconds Radiation Exposure Index (if provided by the fluoroscopic device): 10.6 mGy Number of Acquired Spot Images: 0 COMPARISON:  None. FINDINGS: No aspiration .  Minimal penetration with thin IMPRESSION: No aspiration .  Minimal penetration within thin. Please refer to the Speech Pathologists report for complete details and recommendations. Electronically Signed   By: Suzy Bouchard M.D.   On: 09/22/2019 14:51  Assessment/Plan There are no diagnoses linked to this encounter.

## 2019-09-27 NOTE — Progress Notes (Signed)
This encounter was created in error - please disregard.

## 2019-09-28 ENCOUNTER — Non-Acute Institutional Stay (SKILLED_NURSING_FACILITY): Payer: Medicare Other | Admitting: Internal Medicine

## 2019-09-28 ENCOUNTER — Encounter: Payer: Self-pay | Admitting: Internal Medicine

## 2019-09-28 DIAGNOSIS — C61 Malignant neoplasm of prostate: Secondary | ICD-10-CM

## 2019-09-28 DIAGNOSIS — N183 Chronic kidney disease, stage 3 unspecified: Secondary | ICD-10-CM | POA: Diagnosis not present

## 2019-09-28 DIAGNOSIS — F0281 Dementia in other diseases classified elsewhere with behavioral disturbance: Secondary | ICD-10-CM

## 2019-09-28 DIAGNOSIS — D649 Anemia, unspecified: Secondary | ICD-10-CM

## 2019-09-28 DIAGNOSIS — G20 Parkinson's disease: Secondary | ICD-10-CM

## 2019-09-28 NOTE — Progress Notes (Signed)
Location:    [facility] Room Number: 215/D Place of Service:  SNF [phone]) Provider:  Darrin Nipper, MD  Patient Care Team: Billie Ruddy, MD as PCP - General (Family Medicine)  Extended Emergency Contact Information Primary Emergency Contact: Orpah Melter Address: [place], [place] [address] [name] of [place] Phone: [phone] Mobile Phone: [phone] Relation: Son  Code Status:  DNR Goals of care: Advanced Directive information Advanced Directives 09/28/2019  Does Patient Have a Medical Advance Directive? Yes  Type of Paramedic of [place];Out of facility DNR (pink MOST or yellow form)  Does patient want to make changes to medical advance directive? No - Patient declined  Copy of [place] in Chart? Yes - validated most recent copy scanned in chart (See row information)  Would patient like information on creating a medical advance directive? -  Pre-existing out of facility DNR order (yellow form or pink MOST form) Yellow form placed in chart (order not valid for inpatient use)     Chief Complaint  Patient presents with  . Medical Management of Chronic Issues    Routine visit of medical management  Medical management of chronic medical conditions include Parkinson's disease with dementia-chronic kidney disease-history of prostate cancer-hypertension-anemia-hyperlipidemia-  HPI:  Pt is a 86 y.o. male seen today for an acute visit for follow-up of chronic medical conditions as noted above.  Nursing does not report any recent issues  He recently had a short hospitalization for transfusion secondary to hemoglobin of 6.8.  Patient apparently has been having recurrent severe anemia requiring blood transfusions for the past several years etiology is unknown.  He also has a history of significant Parkinson's disease with dementia- continues on Sinemet  25-2 50 3 times daily- also is on Seroquel 50 mg nightly.  .  Nursing does not report any behaviors-.  He does have a history of prostate cancer but no aggressive intervention or follow-up is desired by family in this regards.  Regards to hypertension he is on clonidine 0.2 mg 3 times a day and Norvasc 2.5 mg a day recent blood pressure appears stable at 114/51.  In regards chronic kidney disease last creatinine was 1.34 on lab done in late December this is relatively baseline with previous exams.  Currently he is sitting on the side of his bed comfortably--he continues to be pleasant somewhat confused but fully cooperative--he does not express any complaints l       Past Medical History:  Diagnosis Date  . Alzheimer's dementia ([place])   . Hypertension   . Parkinson disease ([place])   . Prostate cancer ([place])   . Renal disorder   . Syncope    Past Surgical History:  Procedure Laterality Date  . BACK SURGERY    . IR VERTEBROPLASTY CERV/THOR BX INC UNI/BIL INC/INJECT/IMAGING  01/31/2018  . IR VERTEBROPLASTY CERV/THOR BX INC UNI/BIL INC/INJECT/IMAGING  03/24/2019    No Known Allergies  Outpatient Encounter Medications as of 09/28/2019  Medication Sig  . amLODipine (NORVASC) 2.5 MG tablet Take 1 tablet (2.5 mg total) by mouth daily.  Marland Kitchen ascorbic acid (VITAMIN C) 500 MG tablet Take 500 mg by mouth daily.  Marland Kitchen aspirin 81 MG chewable tablet Chew 1 tablet (81 mg total) by mouth daily.  . carbidopa-levodopa (SINEMET IR) 25-250 MG tablet Take 1 tablet by mouth 3 (three) times daily.  9am, 1pm, 7pm  . Cholecalciferol (VITAMIN D3) 1.25 MG (50000 UT) CAPS Take 1 capsule by mouth every 30 (thirty) days.  . cloNIDine (CATAPRES) 0.2 MG tablet Take 0.2 mg by mouth 3 (three) times daily. 9am, 1pm, 7pm  . feeding supplement, ENSURE ENLIVE, (ENSURE ENLIVE) LIQD Take 237 mLs by mouth 2 (two) times daily between meals.  Marland Kitchen guaiFENesin (MUCINEX) 600 MG 12 hr tablet Take 600 mg by mouth 2 (two) times daily.   . Infant Care Products (BABY SHAMPOO EX) Apply topically. Baby shampo ( less than a dime size ) with warm water can be use for eye hygiene  . Multiple Vitamin (MULTIVITAMIN WITH MINERALS) TABS tablet Take 1 tablet by mouth daily.  . NON FORMULARY Diet Order: Pureed Diet, no therapeutic restrictions  . omeprazole (PRILOSEC) 40 MG capsule Take 40 mg by mouth daily.  . polyethylene glycol (MIRALAX / GLYCOLAX) 17 g packet Take 17 g by mouth daily as needed for mild constipation.  . QUEtiapine (SEROQUEL) 50 MG tablet Take 50 mg by mouth daily at 6 PM.  . rosuvastatin (CRESTOR) 40 MG tablet Take 1 tablet (40 mg total) by mouth daily at 6 PM.  . thiamine (VITAMIN B-1) 100 MG tablet Take 100 mg by mouth daily. Take at 1 PM  . zinc sulfate 220 (50 Zn) MG capsule Take 220 mg by mouth every other day.   No facility-administered encounter medications on file as of 09/28/2019.    Review of Systems   This is limited secondary to dementia nursing does not report any recent issues.  General no complaints of fever or chills.  Skin no itching complaints or complaints of rashes.  Head ears eyes nose mouth and throat no complaints of visual changes sore throat-.  Respiratory does not complain of being short of breath or having cough he has had Mucinex recently added for secretions.  Cardiac does not complain of chest pain or increased edema.  GI no complaints of abdominal discomfort nausea vomiting diarrhea constipation.  GU no complaints of dysuria.  Musculoskeletal does have significant weakness but does not complain of pain.  Neurologic is positive for weakness and history of Parkinson's is not really complaining of dizziness does have some history of syncope it appears.  Psych nursing does not report any recent behaviors-at times he has had some falls which nursing is working trying to prevent and has been a challenge at times.    Immunization History  Administered Date(s) Administered  .  Fluad Quad(high Dose 65+) 05/23/2019  . Influenza, High Dose Seasonal PF 06/26/2017, 06/17/2018  . Influenza-Unspecified 06/22/2016, 06/26/2017  . Pneumococcal Polysaccharide-23 01/07/2015, 05/11/2017  . Zoster Recombinat (Shingrix) 12/11/2017, 03/18/2018   Pertinent  Health Maintenance Due  Topic Date Due  . PNA vac Low Risk Adult (2 of 2 - PCV13) 05/11/2018  . INFLUENZA VACCINE  Completed   No flowsheet data found. Functional Status Survey:    Vitals:   09/28/19 1624  BP: (!) 114/51  Pulse: (!) 55  Resp: 17  Temp: 97.8 F (36.6 C)  TempSrc: Oral  SpO2: 97%  Weight: 128 lb 12.8 oz (58.4 kg)  Height: 6\' 1"  (1.854 m)   Body mass index is 16.99 kg/m. Physical Exam   In general this is a pleasant elderly male in no distress.  His skin is warm and dry he does have scattered solar induced scaling changes.  Eyes visual acuity appears to be intact sclera and conjunctive are clear.  Oropharynx is clear mucous  membranes appear fairly moist.  Chest is clear to auscultation with shallow air entry there is no labored breathing.  Heart is regular rate and rhythm with occasional irregular beats he does not have significant edema.  Abdomen is soft nontender with positive bowel sounds.  Musculoskeletal has general frailty but moves all extremities x4 it appears at baseline.  Neurologic appears grossly intact without lateralizing findings I could not really appreciate a tremor today he does have generalized stiffness.  Psych findings consistent with dementia he is pleasant cooperative does speak some  Labs reviewed:  September 06, 2020.  WBC 6.7 hemoglobin 9.8 platelets 168.  Sodium 138 potassium 4.7 creatinine 1.34.   Recent Labs    04/22/19 0441 04/22/19 0441 04/23/19 0428 05/20/19 1140 05/20/19 1900 05/22/19 0110 09/01/19 1645 09/02/19 0422 09/03/19 0523  NA 142   < > 143   < >  --    < > 137 138 137  K 3.0*   < > 4.0   < >  --    < > 4.2 4.6 4.4  CL 104   <  > 110   < >  --    < > 103 107 104  CO2 29   < > 26   < >  --    < > 25 21* 24  GLUCOSE 104*   < > 102*   < >  --    < > 108* 101* 92  BUN 28*   < > 28*   < >  --    < > 39* 36* 33*  CREATININE 1.84*   < > 1.73*   < >  --    < > 1.61* 1.54* 1.23  CALCIUM 7.9*   < > 7.9*   < >  --    < > 9.0 8.9 9.3  MG 1.7  --  1.7  --  1.8  --   --   --   --    < > = values in this interval not displayed.   Recent Labs    03/30/19 0432 04/20/19 1701 05/20/19 1140  AST 16 35 10*  ALT 5 17 5   ALKPHOS 68 80 56  BILITOT 0.5 0.5 0.3  PROT 5.5* 6.3* 5.6*  ALBUMIN 3.2* 3.3* 2.9*   Recent Labs    06/01/19 1957 06/26/19 0000 09/01/19 1645 09/02/19 0422 09/03/19 0523  WBC   < > 7.1 6.1 7.9 7.5  NEUTROABS  --  5 4.0  --  4.8  HGB   < > 10.1* 6.8* 8.1* 9.6*  HCT   < > 30* 23.0* 27.1* 31.0*  MCV   < >  --  89.8 92.8 91.4  PLT   < > 136* 247 209 211   < > = values in this interval not displayed.   Lab Results  Component Value Date   TSH 1.798 05/21/2019   Lab Results  Component Value Date   HGBA1C 4.9 06/26/2019   Lab Results  Component Value Date   CHOL 207 (H) 03/30/2019   HDL 62 03/30/2019   LDLCALC 131 (H) 03/30/2019   TRIG 70 03/30/2019   CHOLHDL 3.3 03/30/2019    Significant Diagnostic Results in last 30 days:  Leanne Chang OP MEDICARE SPEECH PATH  Result Date: 09/22/2019 Objective Swallowing Evaluation: Type of Study: MBS-Modified Barium Swallow Study  Patient Details Name: KAIMANI BEESLEY MRN: [id number] Date of Birth: 04/10/1929 Today's Date: 09/22/2019 Time: SLP Start Time (ACUTE ONLY): [id number] -  SLP Stop Time (ACUTE ONLY): 1419 SLP Time Calculation (min) (ACUTE ONLY): 38 min Past Medical History: Past Medical History: Diagnosis Date . Alzheimer's dementia ([place])  . Hypertension  . Parkinson disease ([place])  . Prostate cancer ([place])  . Renal disorder  . Syncope  Past Surgical History: Past Surgical History: Procedure Laterality Date . BACK SURGERY   . IR VERTEBROPLASTY CERV/THOR BX INC  UNI/BIL INC/INJECT/IMAGING  01/31/2018 . IR VERTEBROPLASTY CERV/THOR BX INC UNI/BIL INC/INJECT/IMAGING  03/24/2019 HPI: Mr Griesel is an 86 yo male referred for MBS.  Pt PMH + for prostate cancer, Parkinson's - [facility], syncope, GERD, CVA, CKD 3, dementia, and HTN.  Pt has been having dysphagia per [facility] notes concerning for reflux and pharyngoesophageal dysphagia most notably over the last 3 weeks.  He had been diagnosed with silent aspiration after speech eval - but no report available.  Diet is changed from dys3 to puree and pt admits to odynophagia.  Pna noted at end of October 2020 via ER visit.  MBS ordere. Pt  was started on omeprazole 40 MG daily o 12/17 for GERD.  07/25/2019 hospital admit with vomiting/nausea concern for aspiration pneumonitis or pna.  Pt denies improvement with swallowing/coughing since being on a PPI since 08/2019. He reports coughing with food and drink but states her wants to eat regardless.  At times, he will expectorate food and also coughs up phlegm.  Reports he enjoys eating and his goal is to "get rid of this mess" - his swallowing issues.  He denies requiring heimlich.  No data recorded Assessment / Plan / Recommendation CHL IP CLINICAL IMPRESSIONS 09/22/2019 Clinical Impression Suboptimal positioning as pt was kyphotic and leaning right with upper body to left.  View at times was difficult.  Pt presents with moderate oral and mild pharyngeal dysphagia.  Decreased oral/lingual coordination results in decreased propulsion, delayed oral transiting and lingual pumping. Pt needed verbal cues to masticate cracker well as he was essentially trying to swallow it whole.  Piecemealing with large boluses observed which is functional for pt.  Delay in oral transiting was much more pronounced with solids than liquids.  NO aspiration observed but mild laryngeal penetration with thin liquid x1 of 4 boluses.  Cued cough did not fully clear penetrates.  Pharyngeal clearance overall was better with  puree/solid than liquids.   Thus suspect pressure issue from esophagus causing secondary liquid residuals in pharynx. Upon esophageal sweep, pt appears with significant stasis throughout without ANY awareness.  SLP suspects pt's kyphosis negatively impacts esophageal clearance and the esophagus may be his primary area of deficits/source of deficits.  Note per GI, pt not a candidate for endoscopy thus suspect mitigation indicated. Advised pt to consume several small meals/daily and stay sitting upright after eating.  Also advised he drink water with his meals.  Thanks for this referral of this kind gentleman. Of note, pt did not cough during the MBS.     SLP Visit Diagnosis Dysphagia, oropharyngeal phase (R13.12) Attention and concentration deficit following -- Frontal lobe and executive function deficit following -- Impact on safety and function Mild aspiration risk   CHL IP TREATMENT RECOMMENDATION 03/31/2019 Treatment Recommendations Defer treatment plan to f/u with SLP   Prognosis 03/31/2019 Prognosis for Safe Diet Advancement Good Barriers to Reach Goals Cognitive deficits Barriers/Prognosis Comment -- CHL IP DIET RECOMMENDATION 09/22/2019 SLP Diet Recommendations Defer to referring MD/facility, softer foods/thin likely tolerated best Liquid Administration via Cup;Straw Medication Administration Whole meds with puree - start and  follow with water Compensations Slow rate;Small sips/bites;Follow solids with liquid;Clear throat intermittently; Small frequent meals Postural Changes Remain semi-upright after after feeds/meals (Comment);Seated upright at 90 degrees   No flowsheet data found.  CHL IP FOLLOW UP RECOMMENDATIONS 03/31/2019 Follow up Recommendations Home health SLP;24 hour supervision/assistance   No flowsheet data found.     CHL IP ORAL PHASE 09/22/2019 Oral Phase Impaired Oral - Pudding Teaspoon -- Oral - Pudding Cup -- Oral - Honey Teaspoon -- Oral - Honey Cup -- Oral - Nectar Teaspoon Weak lingual  manipulation;Delayed oral transit Oral - Nectar Cup -- Oral - Nectar Straw Weak lingual manipulation;Decreased bolus cohesion Oral - Thin Teaspoon Weak lingual manipulation;Reduced posterior propulsion;Delayed oral transit Oral - Thin Cup -- Oral - Thin Straw Weak lingual manipulation;Reduced posterior propulsion;Piecemeal swallowing;Delayed oral transit;Premature spillage Oral - Puree Weak lingual manipulation;Lingual pumping;Reduced posterior propulsion;Piecemeal swallowing;Delayed oral transit;Decreased bolus cohesion;Premature spillage Oral - Mech Soft Impaired mastication;Weak lingual manipulation;Lingual pumping;Reduced posterior propulsion;Delayed oral transit;Decreased bolus cohesion Oral - Regular -- Oral - Multi-Consistency -- Oral - Pill -- Oral Phase - Comment --  CHL IP PHARYNGEAL PHASE 09/22/2019 Pharyngeal Phase Impaired Pharyngeal- Pudding Teaspoon -- Pharyngeal -- Pharyngeal- Pudding Cup -- Pharyngeal -- Pharyngeal- Honey Teaspoon -- Pharyngeal -- Pharyngeal- Honey Cup -- Pharyngeal -- Pharyngeal- Nectar Teaspoon WFL;Pharyngeal residue - valleculae Pharyngeal Material does not enter airway Pharyngeal- Nectar Cup -- Pharyngeal -- Pharyngeal- Nectar Straw WFL;Pharyngeal residue - valleculae Pharyngeal Material does not enter airway Pharyngeal- Thin Teaspoon Pharyngeal residue - valleculae Pharyngeal Material does not enter airway Pharyngeal- Thin Cup -- Pharyngeal -- Pharyngeal- Thin Straw Reduced airway/laryngeal closure;Penetration/Aspiration during swallow;Pharyngeal residue - valleculae;Pharyngeal residue - pyriform Pharyngeal -- Pharyngeal- Puree WFL Pharyngeal Material does not enter airway Pharyngeal- Mechanical Soft WFL;Pharyngeal residue - valleculae Pharyngeal Material does not enter airway Pharyngeal- Regular -- Pharyngeal -- Pharyngeal- Multi-consistency -- Pharyngeal -- Pharyngeal- Pill -- Pharyngeal -- Pharyngeal Comment --  CHL IP CERVICAL ESOPHAGEAL PHASE 09/22/2019 Cervical Esophageal  Phase Impaired Pudding Teaspoon -- Pudding Cup -- Honey Teaspoon -- Honey Cup -- Nectar Teaspoon -- Nectar Cup -- Nectar Straw -- Thin Teaspoon -- Thin Cup -- Thin Straw -- Puree -- Mechanical Soft -- Regular -- Multi-consistency -- Pill -- Cervical Esophageal Comment See notes Macario Golds 09/22/2019, 4:07 PM  Kathleen Lime, MS [facility] SLP Acute Rehab Services Office [phone]           CLINICAL DATA:  Dysphagia. Cough/GE reflux disease/other secondary diagnosis EXAM: MODIFIED BARIUM SWALLOW TECHNIQUE: Different consistencies of barium were administered orally to the patient by the Speech Pathologist. Imaging of the pharynx was performed in the lateral projection. The radiologist was present in the fluoroscopy room for this study, providing personal supervision. FLUOROSCOPY TIME:  Fluoroscopy Time: 1 minutes 48 seconds Radiation Exposure Index (if provided by the fluoroscopic device): 10.6 mGy Number of Acquired Spot Images: 0 COMPARISON:  None. FINDINGS: No aspiration .  Minimal penetration with thin IMPRESSION: No aspiration .  Minimal penetration within thin. Please refer to the Speech Pathologists report for complete details and recommendations. Electronically Signed   By: Suzy Bouchard M.D.   On: 09/22/2019 14:51    Assessment/Plan  #1 history of anemia-etiology unknown apparently this is recurrent he is status post transfusion in the hospital last month-will update a hemoglobin last hemoglobin was 9.8 in late December.  Clinically appears to be at baseline.  2.  History of Parkinson's disease with dementia at this point continue supportive care he is on Sinemet 25-250 mg 3 times  daily-he also continues on Seroquel at night for apparent behaviors.  3.  History of chronic kidney disease appears stable with a creatinine of 1.34 on most recent lab   #4-history of hypertension at this point will monitor is on Norvasc 2.5 mg a day and clonidine 0.2 mg 3 times daily.  5.  History of prostate cancer  as noted above family does not wish aggressive work-up or treatment.  6.  History of hyperlipidemia he continues on Crestor 40 mg a day.  [id number].

## 2019-09-29 ENCOUNTER — Encounter: Payer: Self-pay | Admitting: Internal Medicine

## 2019-09-29 DIAGNOSIS — M6281 Muscle weakness (generalized): Secondary | ICD-10-CM | POA: Diagnosis not present

## 2019-09-29 DIAGNOSIS — D649 Anemia, unspecified: Secondary | ICD-10-CM | POA: Diagnosis not present

## 2019-09-29 DIAGNOSIS — D509 Iron deficiency anemia, unspecified: Secondary | ICD-10-CM | POA: Diagnosis not present

## 2019-09-29 DIAGNOSIS — I129 Hypertensive chronic kidney disease with stage 1 through stage 4 chronic kidney disease, or unspecified chronic kidney disease: Secondary | ICD-10-CM | POA: Diagnosis not present

## 2019-09-29 DIAGNOSIS — R1312 Dysphagia, oropharyngeal phase: Secondary | ICD-10-CM | POA: Diagnosis not present

## 2019-09-29 DIAGNOSIS — E559 Vitamin D deficiency, unspecified: Secondary | ICD-10-CM | POA: Diagnosis not present

## 2019-09-29 DIAGNOSIS — G20 Parkinson's disease: Secondary | ICD-10-CM | POA: Diagnosis not present

## 2019-09-29 DIAGNOSIS — R2681 Unsteadiness on feet: Secondary | ICD-10-CM | POA: Diagnosis not present

## 2019-09-29 LAB — CBC AND DIFFERENTIAL
HCT: 29 — AB (ref 41–53)
Hemoglobin: 9.6 — AB (ref 13.5–17.5)
Platelets: 192 (ref 150–399)
WBC: 7.4

## 2019-09-29 LAB — COMPREHENSIVE METABOLIC PANEL
Calcium: 10 (ref 8.7–10.7)
GFR calc Af Amer: 46.46
GFR calc non Af Amer: 40.09

## 2019-09-29 LAB — BASIC METABOLIC PANEL
BUN: 39 — AB (ref 4–21)
CO2: 24 — AB (ref 13–22)
Chloride: 104 (ref 99–108)
Creatinine: 1.5 — AB (ref 0.6–1.3)
Glucose: 102
Potassium: 3.8 (ref 3.4–5.3)
Sodium: 142 (ref 137–147)

## 2019-09-29 LAB — CBC: RBC: 3.45 — AB (ref 3.87–5.11)

## 2019-09-30 DIAGNOSIS — R2681 Unsteadiness on feet: Secondary | ICD-10-CM | POA: Diagnosis not present

## 2019-09-30 DIAGNOSIS — M6281 Muscle weakness (generalized): Secondary | ICD-10-CM | POA: Diagnosis not present

## 2019-09-30 DIAGNOSIS — D509 Iron deficiency anemia, unspecified: Secondary | ICD-10-CM | POA: Diagnosis not present

## 2019-09-30 DIAGNOSIS — G20 Parkinson's disease: Secondary | ICD-10-CM | POA: Diagnosis not present

## 2019-09-30 DIAGNOSIS — R1312 Dysphagia, oropharyngeal phase: Secondary | ICD-10-CM | POA: Diagnosis not present

## 2019-10-01 DIAGNOSIS — D509 Iron deficiency anemia, unspecified: Secondary | ICD-10-CM | POA: Diagnosis not present

## 2019-10-01 DIAGNOSIS — R2681 Unsteadiness on feet: Secondary | ICD-10-CM | POA: Diagnosis not present

## 2019-10-01 DIAGNOSIS — G20 Parkinson's disease: Secondary | ICD-10-CM | POA: Diagnosis not present

## 2019-10-01 DIAGNOSIS — R1312 Dysphagia, oropharyngeal phase: Secondary | ICD-10-CM | POA: Diagnosis not present

## 2019-10-01 DIAGNOSIS — M6281 Muscle weakness (generalized): Secondary | ICD-10-CM | POA: Diagnosis not present

## 2019-10-02 DIAGNOSIS — M6281 Muscle weakness (generalized): Secondary | ICD-10-CM | POA: Diagnosis not present

## 2019-10-02 DIAGNOSIS — G20 Parkinson's disease: Secondary | ICD-10-CM | POA: Diagnosis not present

## 2019-10-02 DIAGNOSIS — D509 Iron deficiency anemia, unspecified: Secondary | ICD-10-CM | POA: Diagnosis not present

## 2019-10-02 DIAGNOSIS — R1312 Dysphagia, oropharyngeal phase: Secondary | ICD-10-CM | POA: Diagnosis not present

## 2019-10-02 DIAGNOSIS — R2681 Unsteadiness on feet: Secondary | ICD-10-CM | POA: Diagnosis not present

## 2019-10-05 DIAGNOSIS — R1312 Dysphagia, oropharyngeal phase: Secondary | ICD-10-CM | POA: Diagnosis not present

## 2019-10-05 DIAGNOSIS — M6281 Muscle weakness (generalized): Secondary | ICD-10-CM | POA: Diagnosis not present

## 2019-10-05 DIAGNOSIS — J069 Acute upper respiratory infection, unspecified: Secondary | ICD-10-CM | POA: Diagnosis not present

## 2019-10-05 DIAGNOSIS — D509 Iron deficiency anemia, unspecified: Secondary | ICD-10-CM | POA: Diagnosis not present

## 2019-10-05 DIAGNOSIS — R2681 Unsteadiness on feet: Secondary | ICD-10-CM | POA: Diagnosis not present

## 2019-10-05 DIAGNOSIS — G20 Parkinson's disease: Secondary | ICD-10-CM | POA: Diagnosis not present

## 2019-10-06 DIAGNOSIS — M6281 Muscle weakness (generalized): Secondary | ICD-10-CM | POA: Diagnosis not present

## 2019-10-06 DIAGNOSIS — R1312 Dysphagia, oropharyngeal phase: Secondary | ICD-10-CM | POA: Diagnosis not present

## 2019-10-06 DIAGNOSIS — D509 Iron deficiency anemia, unspecified: Secondary | ICD-10-CM | POA: Diagnosis not present

## 2019-10-06 DIAGNOSIS — R2681 Unsteadiness on feet: Secondary | ICD-10-CM | POA: Diagnosis not present

## 2019-10-06 DIAGNOSIS — G20 Parkinson's disease: Secondary | ICD-10-CM | POA: Diagnosis not present

## 2019-10-07 DIAGNOSIS — R1312 Dysphagia, oropharyngeal phase: Secondary | ICD-10-CM | POA: Diagnosis not present

## 2019-10-07 DIAGNOSIS — G20 Parkinson's disease: Secondary | ICD-10-CM | POA: Diagnosis not present

## 2019-10-07 DIAGNOSIS — M6281 Muscle weakness (generalized): Secondary | ICD-10-CM | POA: Diagnosis not present

## 2019-10-07 DIAGNOSIS — D509 Iron deficiency anemia, unspecified: Secondary | ICD-10-CM | POA: Diagnosis not present

## 2019-10-07 DIAGNOSIS — R2681 Unsteadiness on feet: Secondary | ICD-10-CM | POA: Diagnosis not present

## 2019-10-08 DIAGNOSIS — M6281 Muscle weakness (generalized): Secondary | ICD-10-CM | POA: Diagnosis not present

## 2019-10-08 DIAGNOSIS — G20 Parkinson's disease: Secondary | ICD-10-CM | POA: Diagnosis not present

## 2019-10-08 DIAGNOSIS — D509 Iron deficiency anemia, unspecified: Secondary | ICD-10-CM | POA: Diagnosis not present

## 2019-10-08 DIAGNOSIS — R2681 Unsteadiness on feet: Secondary | ICD-10-CM | POA: Diagnosis not present

## 2019-10-08 DIAGNOSIS — R1312 Dysphagia, oropharyngeal phase: Secondary | ICD-10-CM | POA: Diagnosis not present

## 2019-10-09 DIAGNOSIS — D509 Iron deficiency anemia, unspecified: Secondary | ICD-10-CM | POA: Diagnosis not present

## 2019-10-09 DIAGNOSIS — R2681 Unsteadiness on feet: Secondary | ICD-10-CM | POA: Diagnosis not present

## 2019-10-09 DIAGNOSIS — M6281 Muscle weakness (generalized): Secondary | ICD-10-CM | POA: Diagnosis not present

## 2019-10-09 DIAGNOSIS — G20 Parkinson's disease: Secondary | ICD-10-CM | POA: Diagnosis not present

## 2019-10-09 DIAGNOSIS — R1312 Dysphagia, oropharyngeal phase: Secondary | ICD-10-CM | POA: Diagnosis not present

## 2019-10-10 DIAGNOSIS — D509 Iron deficiency anemia, unspecified: Secondary | ICD-10-CM | POA: Diagnosis not present

## 2019-10-10 DIAGNOSIS — R1312 Dysphagia, oropharyngeal phase: Secondary | ICD-10-CM | POA: Diagnosis not present

## 2019-10-10 DIAGNOSIS — R2681 Unsteadiness on feet: Secondary | ICD-10-CM | POA: Diagnosis not present

## 2019-10-10 DIAGNOSIS — G20 Parkinson's disease: Secondary | ICD-10-CM | POA: Diagnosis not present

## 2019-10-10 DIAGNOSIS — M6281 Muscle weakness (generalized): Secondary | ICD-10-CM | POA: Diagnosis not present

## 2019-10-12 DIAGNOSIS — D509 Iron deficiency anemia, unspecified: Secondary | ICD-10-CM | POA: Diagnosis not present

## 2019-10-12 DIAGNOSIS — M6281 Muscle weakness (generalized): Secondary | ICD-10-CM | POA: Diagnosis not present

## 2019-10-12 DIAGNOSIS — R2681 Unsteadiness on feet: Secondary | ICD-10-CM | POA: Diagnosis not present

## 2019-10-12 DIAGNOSIS — G20 Parkinson's disease: Secondary | ICD-10-CM | POA: Diagnosis not present

## 2019-10-13 DIAGNOSIS — M6281 Muscle weakness (generalized): Secondary | ICD-10-CM | POA: Diagnosis not present

## 2019-10-13 DIAGNOSIS — R2681 Unsteadiness on feet: Secondary | ICD-10-CM | POA: Diagnosis not present

## 2019-10-13 DIAGNOSIS — D509 Iron deficiency anemia, unspecified: Secondary | ICD-10-CM | POA: Diagnosis not present

## 2019-10-13 DIAGNOSIS — G20 Parkinson's disease: Secondary | ICD-10-CM | POA: Diagnosis not present

## 2019-10-14 DIAGNOSIS — M6281 Muscle weakness (generalized): Secondary | ICD-10-CM | POA: Diagnosis not present

## 2019-10-14 DIAGNOSIS — G20 Parkinson's disease: Secondary | ICD-10-CM | POA: Diagnosis not present

## 2019-10-14 DIAGNOSIS — R2681 Unsteadiness on feet: Secondary | ICD-10-CM | POA: Diagnosis not present

## 2019-10-14 DIAGNOSIS — D509 Iron deficiency anemia, unspecified: Secondary | ICD-10-CM | POA: Diagnosis not present

## 2019-10-15 DIAGNOSIS — R2681 Unsteadiness on feet: Secondary | ICD-10-CM | POA: Diagnosis not present

## 2019-10-15 DIAGNOSIS — D509 Iron deficiency anemia, unspecified: Secondary | ICD-10-CM | POA: Diagnosis not present

## 2019-10-15 DIAGNOSIS — G20 Parkinson's disease: Secondary | ICD-10-CM | POA: Diagnosis not present

## 2019-10-15 DIAGNOSIS — M6281 Muscle weakness (generalized): Secondary | ICD-10-CM | POA: Diagnosis not present

## 2019-10-16 DIAGNOSIS — M6281 Muscle weakness (generalized): Secondary | ICD-10-CM | POA: Diagnosis not present

## 2019-10-16 DIAGNOSIS — G20 Parkinson's disease: Secondary | ICD-10-CM | POA: Diagnosis not present

## 2019-10-16 DIAGNOSIS — D509 Iron deficiency anemia, unspecified: Secondary | ICD-10-CM | POA: Diagnosis not present

## 2019-10-16 DIAGNOSIS — H353231 Exudative age-related macular degeneration, bilateral, with active choroidal neovascularization: Secondary | ICD-10-CM | POA: Diagnosis not present

## 2019-10-16 DIAGNOSIS — R2681 Unsteadiness on feet: Secondary | ICD-10-CM | POA: Diagnosis not present

## 2019-10-19 DIAGNOSIS — R2681 Unsteadiness on feet: Secondary | ICD-10-CM | POA: Diagnosis not present

## 2019-10-19 DIAGNOSIS — D509 Iron deficiency anemia, unspecified: Secondary | ICD-10-CM | POA: Diagnosis not present

## 2019-10-19 DIAGNOSIS — G20 Parkinson's disease: Secondary | ICD-10-CM | POA: Diagnosis not present

## 2019-10-19 DIAGNOSIS — M6281 Muscle weakness (generalized): Secondary | ICD-10-CM | POA: Diagnosis not present

## 2019-10-20 DIAGNOSIS — G20 Parkinson's disease: Secondary | ICD-10-CM | POA: Diagnosis not present

## 2019-10-20 DIAGNOSIS — R2681 Unsteadiness on feet: Secondary | ICD-10-CM | POA: Diagnosis not present

## 2019-10-20 DIAGNOSIS — M6281 Muscle weakness (generalized): Secondary | ICD-10-CM | POA: Diagnosis not present

## 2019-10-20 DIAGNOSIS — D509 Iron deficiency anemia, unspecified: Secondary | ICD-10-CM | POA: Diagnosis not present

## 2019-10-22 DIAGNOSIS — M6281 Muscle weakness (generalized): Secondary | ICD-10-CM | POA: Diagnosis not present

## 2019-10-22 DIAGNOSIS — D509 Iron deficiency anemia, unspecified: Secondary | ICD-10-CM | POA: Diagnosis not present

## 2019-10-22 DIAGNOSIS — G20 Parkinson's disease: Secondary | ICD-10-CM | POA: Diagnosis not present

## 2019-10-22 DIAGNOSIS — R2681 Unsteadiness on feet: Secondary | ICD-10-CM | POA: Diagnosis not present

## 2019-10-23 ENCOUNTER — Encounter: Payer: Self-pay | Admitting: Internal Medicine

## 2019-10-23 ENCOUNTER — Non-Acute Institutional Stay (SKILLED_NURSING_FACILITY): Payer: Medicare Other | Admitting: Internal Medicine

## 2019-10-23 DIAGNOSIS — R2681 Unsteadiness on feet: Secondary | ICD-10-CM | POA: Diagnosis not present

## 2019-10-23 DIAGNOSIS — F0281 Dementia in other diseases classified elsewhere with behavioral disturbance: Secondary | ICD-10-CM | POA: Diagnosis not present

## 2019-10-23 DIAGNOSIS — I10 Essential (primary) hypertension: Secondary | ICD-10-CM | POA: Diagnosis not present

## 2019-10-23 DIAGNOSIS — G20 Parkinson's disease: Secondary | ICD-10-CM

## 2019-10-23 DIAGNOSIS — M6281 Muscle weakness (generalized): Secondary | ICD-10-CM | POA: Diagnosis not present

## 2019-10-23 DIAGNOSIS — D509 Iron deficiency anemia, unspecified: Secondary | ICD-10-CM | POA: Diagnosis not present

## 2019-10-23 NOTE — Progress Notes (Signed)
Location:  [place] Room Number: 111-P Place of Service:  SNF [phone])  Volanda Napoleon, Langley Adie, MD  Patient Care Team: Billie Ruddy, MD as PCP - General [facility] Medicine)  Extended Emergency Contact Information Primary Emergency Contact: [facility] Address: [place], [place] [address] [name] of [place] Phone: [phone] Mobile Phone: [phone] Relation: Son    Allergies: Patient has no known allergies.  Chief Complaint  Patient presents with  . Medical Management of Chronic Issues    Routine [facility] visit    HPI: Patient is an 86 y.o. male who is being seen for routine issues of hypertension, dementia, and Parkinson's disease.  Past Medical History:  Diagnosis Date  . Alzheimer's dementia ([place])   . Hypertension   . Parkinson disease ([place])   . Prostate cancer ([place])   . Renal disorder   . Syncope     Past Surgical History:  Procedure Laterality Date  . BACK SURGERY    . IR VERTEBROPLASTY CERV/THOR BX INC UNI/BIL INC/INJECT/IMAGING  01/31/2018  . IR VERTEBROPLASTY CERV/THOR BX INC UNI/BIL INC/INJECT/IMAGING  03/24/2019    Allergies as of 10/23/2019   No Known Allergies     Medication List       Accurate as of October 23, 2019 11:59 PM. If you have any questions, ask your nurse or doctor.        amLODipine 2.5 MG tablet Commonly known as: NORVASC Take 1 tablet (2.5 mg total) by mouth daily.   ascorbic acid 500 MG tablet Commonly known as: VITAMIN C Take 500 mg by mouth daily.   aspirin 81 MG chewable tablet Chew 1 tablet (81 mg total) by mouth daily.   BABY SHAMPOO EX Apply topically. Baby shampo ( less than a dime size ) with warm water can be use for eye hygiene   carbidopa-levodopa 25-250 MG tablet Commonly known as: SINEMET IR Take 1 tablet by mouth 3 (three) times daily. 9am, 1pm, 7pm   cloNIDine 0.2 MG tablet Commonly known as: CATAPRES Take 0.2 mg by mouth 3  (three) times daily. 9am, 1pm, 7pm   feeding supplement (ENSURE ENLIVE) Liqd Take 237 mLs by mouth 2 (two) times daily between meals.   guaiFENesin 600 MG 12 hr tablet Commonly known as: MUCINEX Take 600 mg by mouth 2 (two) times daily.   multivitamin with minerals Tabs tablet Take 1 tablet by mouth daily.   NON FORMULARY Diet Order: Pureed Diet, no therapeutic restrictions   omeprazole 40 MG capsule Commonly known as: PRILOSEC Take 40 mg by mouth daily.   polyethylene glycol 17 g packet Commonly known as: MIRALAX / GLYCOLAX Take 17 g by mouth daily as needed for mild constipation.   QUEtiapine 50 MG tablet Commonly known as: SEROQUEL Take 25 mg by mouth daily at 6 PM.   rosuvastatin 40 MG tablet Commonly known as: CRESTOR Take 1 tablet (40 mg total) by mouth daily at 6 PM.   thiamine 100 MG tablet Commonly known as: VITAMIN B-1 Take 100 mg by mouth daily. Take at 1 PM   Vitamin D3 1.25 MG (50000 UT) Caps Take 1 capsule by mouth every 30 (thirty) days.   zinc sulfate 220 (50 Zn) MG capsule Take 220 mg by mouth every other day.       No orders of the defined types were placed in this encounter.   Immunization History  Administered Date(s) Administered  . Fluad Quad(high Dose 65+) 05/23/2019  . Influenza, High Dose Seasonal PF 06/26/2017, 06/17/2018  . Influenza-Unspecified 06/22/2016, 06/26/2017  . Moderna SARS-COVID-2 Vaccination 10/08/2019  . Pneumococcal Polysaccharide-23 01/07/2015, 05/11/2017  . Zoster Recombinat (Shingrix) 12/11/2017, 03/18/2018    Social History   Tobacco Use  . Smoking status: Former Smoker    Packs/day: 2.00    Years: 30.00    Pack years: 60.00    Types: Cigarettes    Quit date: 1990    Years since quitting: 31.1  . Smokeless tobacco: Never Used  Substance Use Topics  . Alcohol use: Yes    Alcohol/week: 14.0 standard drinks    Types: 14 Cans of beer per week    Review of Systems   GENERAL:  no fevers, fatigue,  appetite changes SKIN: No itching, rash HEENT: No complaint RESPIRATORY: No cough, wheezing, SOB CARDIAC: No chest pain, palpitations, lower extremity edema  GI: No abdominal pain, No N/V/D or constipation, No heartburn or reflux  GU: No dysuria, frequency or urgency, or incontinence  MUSCULOSKELETAL: No unrelieved bone/joint pain NEUROLOGIC: No headache, dizziness  PSYCHIATRIC: No overt anxiety or sadness  Vitals:   10/23/19 1440  BP: (!) 145/82  Pulse: 74  Resp: 18  Temp: 98 F (36.7 C)   Body mass index is 17.78 kg/m. Physical Exam  GENERAL APPEARANCE: Alert, conversant, No acute distress  SKIN: No diaphoresis rash HEENT: Unremarkable RESPIRATORY: Breathing is even, unlabored. Lung sounds are clear   CARDIOVASCULAR: Heart RRR no murmurs, rubs or gallops. No peripheral edema  GASTROINTESTINAL: Abdomen is soft, non-tender, not distended w/ normal bowel sounds.  GENITOURINARY: Bladder non tender, not distended  MUSCULOSKELETAL: No abnormal joints or musculature NEUROLOGIC: Cranial nerves 2-12 grossly intact. Moves all extremities; minimal tremor PSYCHIATRIC: Mood and affect with dementia, no behavioral issues  Patient Active Problem List   Diagnosis Date Noted  . Severe anemia 09/01/2019  . Dementia with behavioral disturbance ([place]) 05/30/2019  . Hyperlipidemia 05/30/2019  . Bradycardia 05/22/2019  . Goals of care, counseling/discussion   . Palliative care by specialist   . Uncontrolled hypertension 05/21/2019  . Normocytic anemia 05/21/2019  . CKD (chronic kidney disease), stage III 05/21/2019  . Physical deconditioning 05/21/2019  . Hematuria 05/21/2019  . UTI (urinary tract infection) 05/20/2019  . Hypokalemia 04/20/2019  . Hypomagnesemia 04/20/2019  . Parkinson disease ([place]) 04/20/2019  . Cardiac arrhythmia 04/20/2019  . Acute renal failure superimposed on stage 3 chronic kidney disease ([place]) 04/20/2019  . Protein-calorie malnutrition, severe 03/31/2019  .  Cerebral embolism with cerebral infarction 03/30/2019  . POTS (postural orthostatic tachycardia syndrome) 03/28/2019  . HTN (hypertension) 02/04/2018  . Prostate cancer ([place]) 02/04/2018  . Orthostatic hypotension 02/04/2018  . Iron deficiency anemia 02/04/2018  . Syncope 02/02/2018    CMP     Component Value Date/Time   NA 137 09/03/2019 0523   NA 143 06/26/2019 0000   K 4.4 09/03/2019 0523   CL 104 09/03/2019 0523   CO2 24 09/03/2019 0523   GLUCOSE 92 09/03/2019 0523   BUN 33 (H) 09/03/2019 0523   BUN 32 (A) 06/26/2019 0000   CREATININE 1.23 09/03/2019 0523   CALCIUM 9.3 09/03/2019 0523   PROT 5.6 (L) 05/20/2019 1140   ALBUMIN 2.9 (L) 05/20/2019 1140   AST 10 (L) 05/20/2019 1140   ALT 5 05/20/2019 1140   ALKPHOS 56 05/20/2019 1140   BILITOT 0.3 05/20/2019 1140   GFRNONAA 52 (L) 09/03/2019 0523   GFRAA >60 09/03/2019  [id number]   Recent Labs    04/22/19 0441 04/22/19 0441 04/23/19 0428 05/20/19 1140 05/20/19 1900 05/22/19 0110 09/01/19 1645 09/02/19 0422 09/03/19 0523  NA 142   < > 143   < >  --    < > 137 138 137  K 3.0*   < > 4.0   < >  --    < > 4.2 4.6 4.4  CL 104   < > 110   < >  --    < > 103 107 104  CO2 29   < > 26   < >  --    < > 25 21* 24  GLUCOSE 104*   < > 102*   < >  --    < > 108* 101* 92  BUN 28*   < > 28*   < >  --    < > 39* 36* 33*  CREATININE 1.84*   < > 1.73*   < >  --    < > 1.61* 1.54* 1.23  CALCIUM 7.9*   < > 7.9*   < >  --    < > 9.0 8.9 9.3  MG 1.7  --  1.7  --  1.8  --   --   --   --    < > = values in this interval not displayed.   Recent Labs    03/30/19 0432 04/20/19 1701 05/20/19 1140  AST 16 35 10*  ALT 5 17 5   ALKPHOS 68 80 56  BILITOT 0.5 0.5 0.3  PROT 5.5* 6.3* 5.6*  ALBUMIN 3.2* 3.3* 2.9*   Recent Labs    06/01/19 1957 06/26/19 0000 09/01/19 1645 09/02/19 0422 09/03/19 0523  WBC   < > 7.1 6.1 7.9 7.5  NEUTROABS  --  5 4.0  --  4.8  HGB   < > 10.1* 6.8* 8.1* 9.6*  HCT   < > 30* 23.0* 27.1* 31.0*  MCV   < >  --   89.8 92.8 91.4  PLT   < > 136* 247 209 211   < > = values in this interval not displayed.   Recent Labs    03/30/19 0432  CHOL 207*  LDLCALC 131*  TRIG 70   No results found for: [facility] Lab Results  Component Value Date   TSH 1.798 05/21/2019   Lab Results  Component Value Date   HGBA1C 4.9 06/26/2019   Lab Results  Component Value Date   CHOL 207 (H) 03/30/2019   HDL 62 03/30/2019   LDLCALC 131 (H) 03/30/2019   TRIG 70 03/30/2019   CHOLHDL 3.3 03/30/2019    Significant Diagnostic Results in last 30 days:  No results found.  Assessment and Plan  HTN (hypertension) Controlled; continue clonidine 0.2 mg every 8 hours and Norvasc 2.5 mg daily  Dementia with behavioral disturbance (HCC) On no dementia specific medications; patient is on Seroquel 25 mg at 6 PM for behaviors  Parkinson disease ([place]) Chronic and stable; continue Sinemet 25-2 50 p.o. 3 times daily     Hennie Duos, MD

## 2019-10-24 DIAGNOSIS — M6281 Muscle weakness (generalized): Secondary | ICD-10-CM | POA: Diagnosis not present

## 2019-10-24 DIAGNOSIS — D509 Iron deficiency anemia, unspecified: Secondary | ICD-10-CM | POA: Diagnosis not present

## 2019-10-24 DIAGNOSIS — R2681 Unsteadiness on feet: Secondary | ICD-10-CM | POA: Diagnosis not present

## 2019-10-24 DIAGNOSIS — G20 Parkinson's disease: Secondary | ICD-10-CM | POA: Diagnosis not present

## 2019-10-25 ENCOUNTER — Encounter: Payer: Self-pay | Admitting: Internal Medicine

## 2019-10-25 NOTE — Assessment & Plan Note (Signed)
On no dementia specific medications; patient is on Seroquel 25 mg at 6 PM for behaviors

## 2019-10-25 NOTE — Assessment & Plan Note (Signed)
Controlled; continue clonidine 0.2 mg every 8 hours and Norvasc 2.5 mg daily

## 2019-10-25 NOTE — Assessment & Plan Note (Signed)
Chronic and stable; continue Sinemet 25-2 50 p.o. 3 times daily

## 2019-10-26 DIAGNOSIS — G20 Parkinson's disease: Secondary | ICD-10-CM | POA: Diagnosis not present

## 2019-10-26 DIAGNOSIS — M6281 Muscle weakness (generalized): Secondary | ICD-10-CM | POA: Diagnosis not present

## 2019-10-26 DIAGNOSIS — D509 Iron deficiency anemia, unspecified: Secondary | ICD-10-CM | POA: Diagnosis not present

## 2019-10-26 DIAGNOSIS — R2681 Unsteadiness on feet: Secondary | ICD-10-CM | POA: Diagnosis not present

## 2019-10-27 DIAGNOSIS — G20 Parkinson's disease: Secondary | ICD-10-CM | POA: Diagnosis not present

## 2019-10-27 DIAGNOSIS — D509 Iron deficiency anemia, unspecified: Secondary | ICD-10-CM | POA: Diagnosis not present

## 2019-10-27 DIAGNOSIS — R2681 Unsteadiness on feet: Secondary | ICD-10-CM | POA: Diagnosis not present

## 2019-10-27 DIAGNOSIS — M6281 Muscle weakness (generalized): Secondary | ICD-10-CM | POA: Diagnosis not present

## 2019-10-28 ENCOUNTER — Non-Acute Institutional Stay (SKILLED_NURSING_FACILITY): Payer: Medicare Other | Admitting: Internal Medicine

## 2019-10-28 ENCOUNTER — Encounter: Payer: Self-pay | Admitting: Internal Medicine

## 2019-10-28 DIAGNOSIS — I10 Essential (primary) hypertension: Secondary | ICD-10-CM | POA: Diagnosis not present

## 2019-10-28 DIAGNOSIS — F0281 Dementia in other diseases classified elsewhere with behavioral disturbance: Secondary | ICD-10-CM | POA: Diagnosis not present

## 2019-10-28 DIAGNOSIS — G20 Parkinson's disease: Secondary | ICD-10-CM | POA: Diagnosis not present

## 2019-10-28 DIAGNOSIS — Z79899 Other long term (current) drug therapy: Secondary | ICD-10-CM

## 2019-10-28 DIAGNOSIS — D509 Iron deficiency anemia, unspecified: Secondary | ICD-10-CM | POA: Diagnosis not present

## 2019-10-28 DIAGNOSIS — R2681 Unsteadiness on feet: Secondary | ICD-10-CM | POA: Diagnosis not present

## 2019-10-28 DIAGNOSIS — M6281 Muscle weakness (generalized): Secondary | ICD-10-CM | POA: Diagnosis not present

## 2019-10-28 NOTE — Progress Notes (Signed)
Location:    [place] Room Number: 215/D Place of Service:  SNF [phone]) Provider:  Darrin Nipper, MD  Patient Care Team: Billie Ruddy, MD as PCP - General (Family Medicine)  Extended Emergency Contact Information Primary Emergency Contact: Orpah Melter Address: [place], [place] [address] [name] of [place] Phone: [phone] Mobile Phone: [phone] Relation: Son  Code Status:  DNR Goals of care: Advanced Directive information Advanced Directives 10/28/2019  Does Patient Have a Medical Advance Directive? Yes  Type of Paramedic of [place];Out of facility DNR (pink MOST or yellow form)  Does patient want to make changes to medical advance directive? No - Patient declined  Copy of [place] in Chart? Yes - validated most recent copy scanned in chart (See row information)  Would patient like information on creating a medical advance directive? -  Pre-existing out of facility DNR order (yellow form or pink MOST form) Yellow form placed in chart (order not valid for inpatient use)     Chief Complaint  Patient presents with  . Acute Visit    Medication Management    HPI:  Pt is a 86 y.o. male seen today for an acute visit for medication management.  Pharmacy has left a note asking if he has Seroquel or clonidine can be reduced secondary to patient's history of falls.  Patient is a long-term resident of the facility with a history of Parkinson's disease with dementia as well as chronic kidney disease a history of prostate cancer hypertension anemia and hyperlipidemia.  He actually recently had a short stay in the hospital secondary to anemia that required blood transfusion-etiology of the anemia is still somewhat unknown and secondary to patient's advanced age and comorbidities has not been aggressively pursued.  He has a history of significant  Parkinson's disease with dementia and is on Sinemet 25-2 53 times a day he was on Seroquel 50 mg at night.  However it appears this has recently just been reduced to 25 mg a day I suspect this could be have been done because of his recent falls--I do not believe pharmacy was aware of the change.  Regards to hypertension his systolic still are somewhat variable recent readings range from the 130s to 150s generally-he is on clonidine 0.2 mg 3 times daily as well as Norvasc 2.5 mg a day.  I do not see significant hypotensive readings.  Currently he is resting in bed comfortably he does not really have any complaints-he is pleasant cooperative  Past Medical History:  Diagnosis Date  . Alzheimer's dementia ([place])   . Hypertension   . Parkinson disease ([place])   . Prostate cancer ([place])   . Renal disorder   . Syncope    Past Surgical History:  Procedure Laterality Date  . BACK SURGERY    . IR VERTEBROPLASTY CERV/THOR BX INC UNI/BIL INC/INJECT/IMAGING  01/31/2018  . IR VERTEBROPLASTY CERV/THOR BX INC UNI/BIL INC/INJECT/IMAGING  03/24/2019    No Known Allergies  Outpatient Encounter Medications as of 10/28/2019  Medication Sig  . amLODipine (NORVASC) 2.5 MG tablet Take 1 tablet (2.5 mg total) by mouth daily.  Marland Kitchen ascorbic acid (VITAMIN C) 500 MG tablet Take 500 mg by mouth daily.  Marland Kitchen aspirin 81 MG chewable tablet Chew 1 tablet (81 mg total) by mouth daily.  . carbidopa-levodopa (SINEMET IR) 25-250 MG tablet  Take 1 tablet by mouth 3 (three) times daily. 9am, 1pm, 7pm  . Cholecalciferol (VITAMIN D3) 1.25 MG (50000 UT) CAPS Take 1 capsule by mouth every 30 (thirty) days.  . cloNIDine (CATAPRES) 0.2 MG tablet Take 0.2 mg by mouth 3 (three) times daily. 9am, 1pm, 7pm  . feeding supplement, ENSURE ENLIVE, (ENSURE ENLIVE) LIQD Take 237 mLs by mouth 2 (two) times daily between meals.  Marland Kitchen guaiFENesin (MUCINEX) 600 MG 12 hr tablet Take 600 mg by mouth 2 (two) times daily.  . Infant Care Products (BABY  SHAMPOO EX) Apply topically. Baby shampo ( less than a dime size ) with warm water can be use for eye hygiene  . Multiple Vitamin (MULTIVITAMIN WITH MINERALS) TABS tablet Take 1 tablet by mouth daily.  . NON FORMULARY Diet Order: Pureed Diet, no therapeutic restrictions  . omeprazole (PRILOSEC) 40 MG capsule Take 40 mg by mouth daily.  . polyethylene glycol (MIRALAX / GLYCOLAX) 17 g packet Take 17 g by mouth daily as needed for mild constipation.  . QUEtiapine (SEROQUEL) 50 MG tablet Take 25 mg by mouth daily at 6 PM.   . rosuvastatin (CRESTOR) 40 MG tablet Take 1 tablet (40 mg total) by mouth daily at 6 PM.  . thiamine (VITAMIN B-1) 100 MG tablet Take 100 mg by mouth daily. Take at 1 PM  . zinc sulfate 220 (50 Zn) MG capsule Take 220 mg by mouth every other day.   No facility-administered encounter medications on file as of 10/28/2019.    Review of Systems   This is limited secondary to dementia-nursing does not report any recent acute issues-patient himself has no complaints  Immunization History  Administered Date(s) Administered  . Fluad Quad(high Dose 65+) 05/23/2019  . Influenza, High Dose Seasonal PF 06/26/2017, 06/17/2018  . Influenza-Unspecified 06/22/2016, 06/26/2017  . Moderna SARS-COVID-2 Vaccination 10/08/2019  . Pneumococcal Polysaccharide-23 01/07/2015, 05/11/2017  . Zoster Recombinat (Shingrix) 12/11/2017, 03/18/2018   Pertinent  Health Maintenance Due  Topic Date Due  . PNA vac Low Risk Adult (2 of 2 - PCV13) 05/11/2018  . INFLUENZA VACCINE  Completed   No flowsheet data found. Functional Status Survey:    Vitals:   10/28/19 1416  BP: 138/80  Pulse: 75  Resp: 18  Temp: (!) 97.1 F (36.2 C)  TempSrc: Oral  SpO2: 97%  Weight: 134 lb 12.8 oz (61.1 kg)  Height: 6\' 1"  (1.854 m)   Body mass index is 17.78 kg/m. Physical Exam   In general this is a pleasant elderly male in no distress.  His skin is warm and dry he has numerous solar induced changes which  appear to be stable baseline.  Eyes visual acuity appears to be intact.  Oropharynx is clear mucous membranes moist.  He does have numerous extractions.  Chest is clear to auscultation with somewhat shallow air entry there is no labored breathing.  Heart is regular rate and rhythm distant heart sounds he does not have significant edema.  Abdomen is soft nontender with positive bowel sounds.  Musculoskeletal Limited exam since he is in bed but appears able to move all extremities x4 at baseline cannot really appreciate any deformities or pain with gentle range of motion of the legs hips and arms.  Neurologic he is alert could not really appreciate any lateralizing findings he does have an occasional tremor and generalized stiffness.  Psych findings consistent with dementia he continues to be pleasant and cooperative and follows most verbal commands  Labs reviewed:  September 29, 2019.  WBC 7.4 hemoglobin 9.6 platelets 192.  Sodium 142 potassium 3.8 BUN 38.5 creatinine 1.52.   Recent Labs    04/22/19 0441 04/22/19 0441 04/23/19 0428 05/20/19 1140 05/20/19 1900 05/22/19 0110 09/01/19 1645 09/02/19 0422 09/03/19 0523  NA 142   < > 143   < >  --    < > 137 138 137  K 3.0*   < > 4.0   < >  --    < > 4.2 4.6 4.4  CL 104   < > 110   < >  --    < > 103 107 104  CO2 29   < > 26   < >  --    < > 25 21* 24  GLUCOSE 104*   < > 102*   < >  --    < > 108* 101* 92  BUN 28*   < > 28*   < >  --    < > 39* 36* 33*  CREATININE 1.84*   < > 1.73*   < >  --    < > 1.61* 1.54* 1.23  CALCIUM 7.9*   < > 7.9*   < >  --    < > 9.0 8.9 9.3  MG 1.7  --  1.7  --  1.8  --   --   --   --    < > = values in this interval not displayed.   Recent Labs    03/30/19 0432 04/20/19 1701 05/20/19 1140  AST 16 35 10*  ALT 5 17 5   ALKPHOS 68 80 56  BILITOT 0.5 0.5 0.3  PROT 5.5* 6.3* 5.6*  ALBUMIN 3.2* 3.3* 2.9*   Recent Labs    06/01/19 1957 06/26/19 0000 09/01/19 1645 09/02/19 0422  09/03/19 0523  WBC   < > 7.1 6.1 7.9 7.5  NEUTROABS  --  5 4.0  --  4.8  HGB   < > 10.1* 6.8* 8.1* 9.6*  HCT   < > 30* 23.0* 27.1* 31.0*  MCV   < >  --  89.8 92.8 91.4  PLT   < > 136* 247 209 211   < > = values in this interval not displayed.   Lab Results  Component Value Date   TSH 1.798 05/21/2019   Lab Results  Component Value Date   HGBA1C 4.9 06/26/2019   Lab Results  Component Value Date   CHOL 207 (H) 03/30/2019   HDL 62 03/30/2019   LDLCALC 131 (H) 03/30/2019   TRIG 70 03/30/2019   CHOLHDL 3.3 03/30/2019    Significant Diagnostic Results in last 30 days:  No results found.  Assessment/Plan  #1 medication management with history of falls-Seroquel has just been reduced down to 25 mg a day-at this point will monitor.  At this point would be hesitant to alter clonidine since blood pressures are still borderline elevated at times.  2.  History of anemia requiring transfusion in the past-will update a CBC.  [id number].

## 2019-10-29 DIAGNOSIS — G20 Parkinson's disease: Secondary | ICD-10-CM | POA: Diagnosis not present

## 2019-10-29 DIAGNOSIS — D509 Iron deficiency anemia, unspecified: Secondary | ICD-10-CM | POA: Diagnosis not present

## 2019-10-29 DIAGNOSIS — R2681 Unsteadiness on feet: Secondary | ICD-10-CM | POA: Diagnosis not present

## 2019-10-29 DIAGNOSIS — M6281 Muscle weakness (generalized): Secondary | ICD-10-CM | POA: Diagnosis not present

## 2019-10-31 ENCOUNTER — Encounter: Payer: Self-pay | Admitting: Internal Medicine

## 2019-11-02 DIAGNOSIS — D649 Anemia, unspecified: Secondary | ICD-10-CM | POA: Diagnosis not present

## 2019-11-02 DIAGNOSIS — I129 Hypertensive chronic kidney disease with stage 1 through stage 4 chronic kidney disease, or unspecified chronic kidney disease: Secondary | ICD-10-CM | POA: Diagnosis not present

## 2019-11-02 DIAGNOSIS — E559 Vitamin D deficiency, unspecified: Secondary | ICD-10-CM | POA: Diagnosis not present

## 2019-11-04 DIAGNOSIS — E559 Vitamin D deficiency, unspecified: Secondary | ICD-10-CM | POA: Diagnosis not present

## 2019-11-06 LAB — VITAMIN D 25 HYDROXY (VIT D DEFICIENCY, FRACTURES): Vit D, 25-Hydroxy: 18

## 2019-11-10 ENCOUNTER — Other Ambulatory Visit: Payer: Self-pay

## 2019-11-10 NOTE — Patient Outreach (Signed)
[place] [facility]) Care Management  11/10/2019  Delton Windley [place] 03/11/1929 [id number]   Medication Adherence call to Mr. Ryce Vardeman Telephone call to Patient regarding Medication Adherence unable to reach patient.a person answer the phone he said patient will be back in two weeks. Mr. Furlong is showing past due on Rosuvastatin 40 mg under [place].   [place] Management Direct Dial [phone]  Fax [phone] [email or web address]

## 2019-11-13 DIAGNOSIS — H353211 Exudative age-related macular degeneration, right eye, with active choroidal neovascularization: Secondary | ICD-10-CM | POA: Diagnosis not present

## 2019-11-13 DIAGNOSIS — H353231 Exudative age-related macular degeneration, bilateral, with active choroidal neovascularization: Secondary | ICD-10-CM | POA: Diagnosis not present

## 2019-11-17 DIAGNOSIS — J069 Acute upper respiratory infection, unspecified: Secondary | ICD-10-CM | POA: Diagnosis not present

## 2019-11-19 ENCOUNTER — Non-Acute Institutional Stay (SKILLED_NURSING_FACILITY): Payer: Medicare Other | Admitting: Internal Medicine

## 2019-11-19 ENCOUNTER — Encounter: Payer: Self-pay | Admitting: Internal Medicine

## 2019-11-19 DIAGNOSIS — I10 Essential (primary) hypertension: Secondary | ICD-10-CM | POA: Diagnosis not present

## 2019-11-19 DIAGNOSIS — N183 Chronic kidney disease, stage 3 unspecified: Secondary | ICD-10-CM

## 2019-11-19 DIAGNOSIS — G20 Parkinson's disease: Secondary | ICD-10-CM

## 2019-11-19 DIAGNOSIS — F0281 Dementia in other diseases classified elsewhere with behavioral disturbance: Secondary | ICD-10-CM | POA: Diagnosis not present

## 2019-11-19 NOTE — Progress Notes (Signed)
Location:    [facility] Room Number: 215/D Place of Service:  SNF (31) Provider:  Christie Nottingham, MD  Patient Care Team: Billie Ruddy, MD as PCP - General (Family Medicine)  Extended Emergency Contact Information Primary Emergency Contact: Orpah Melter Address: [place], [place] [address] [name] of [place] Phone: [phone] Mobile Phone: [phone] Relation: Son  Code Status:  DNR Goals of care: Advanced Directive information Advanced Directives 11/19/2019  Does Patient Have a Medical Advance Directive? Yes  Type of Paramedic of [place];Out of facility DNR (pink MOST or yellow form)  Does patient want to make changes to medical advance directive? No - Patient declined  Copy of [place] in Chart? Yes - validated most recent copy scanned in chart (See row information)  Would patient like information on creating a medical advance directive? -  Pre-existing out of facility DNR order (yellow form or pink MOST form) Yellow form placed in chart (order not valid for inpatient use)     Chief Complaint  Patient presents with  . Medical Management of Chronic Issues    Routine visit of medical management   Medical management of chronic medical conditions including Parkinson's disease-dementia--chronic kidney disease-hypertension as well as anemia hyperlipidemia history of prostate cancer.   HPI:  Pt is a 86 y.o. male seen today for medical management of chronic diseases.  As noted above.  Nursing does not report any recent acute issues.  Most recently had a hospitalization for transfusion because his hemoglobin was 6.8.  Apparently he does have a history of recurrent anemia requiring blood transfusions although etiology is unknown.  He also has a history of progressive Parkinson's disease with dementia he is on Sinemet 25-250 mg 3 times daily  he also continues on Seroquel currently 25 mg at night.  This was reduced recently I suspect secondary to history of falls.  Nursing does not report any increased behaviors.  Regards to hypertension his blood pressures appear to be stable with systolics ranging from the 120s-140s-he is on clonidine 0.2 mg 3 times daily as well as Norvasc 2.5 mg a day.  Currently he is resting in bed comfortably he is alert he does follow simple verbal commands continues to be pleasant     Past Medical History:  Diagnosis Date  . Alzheimer's dementia ([place])   . Hypertension   . Parkinson disease ([place])   . Prostate cancer ([place])   . Renal disorder   . Syncope    Past Surgical History:  Procedure Laterality Date  . BACK SURGERY    . IR VERTEBROPLASTY CERV/THOR BX INC UNI/BIL INC/INJECT/IMAGING  01/31/2018  . IR VERTEBROPLASTY CERV/THOR BX INC UNI/BIL INC/INJECT/IMAGING  03/24/2019    No Known Allergies  Allergies as of 11/19/2019   No Known Allergies     Medication List       Accurate as of November 19, 2019  4:03 PM. If you have any questions, ask your nurse or doctor.        amLODipine 2.5 MG tablet Commonly known as: NORVASC Take 1 tablet (2.5 mg total) by mouth daily.   ascorbic acid 500 MG tablet Commonly known as: VITAMIN C Take 500 mg by mouth daily.   aspirin 81 MG chewable tablet Chew 1 tablet (81 mg total) by mouth daily.   BABY SHAMPOO EX Apply topically.  Baby shampo ( less than a dime size ) with warm water can be use for eye hygiene   carbidopa-levodopa 25-250 MG tablet Commonly known as: SINEMET IR Take 1 tablet by mouth 3 (three) times daily. 9am, 1pm, 7pm   cloNIDine 0.2 MG tablet Commonly known as: CATAPRES Take 0.2 mg by mouth 3 (three) times daily. 9am, 1pm, 7pm   D3-50 1.25 MG (50000 UT) capsule Generic drug: Cholecalciferol Take 50,000 Units by mouth every 30 (thirty) days.   feeding supplement (ENSURE ENLIVE) Liqd Take 237 mLs by mouth 2 (two) times  daily between meals.   guaiFENesin 600 MG 12 hr tablet Commonly known as: MUCINEX Take 600 mg by mouth 2 (two) times daily.   multivitamin with minerals Tabs tablet Take 1 tablet by mouth daily.   NON FORMULARY Diet Order: Pureed Diet, no therapeutic restrictions   omeprazole 40 MG capsule Commonly known as: PRILOSEC Take 40 mg by mouth daily.   polyethylene glycol 17 g packet Commonly known as: MIRALAX / GLYCOLAX Take 17 g by mouth daily as needed for mild constipation.   QUEtiapine 50 MG tablet Commonly known as: SEROQUEL Take 25 mg by mouth daily at 6 PM.   rosuvastatin 40 MG tablet Commonly known as: CRESTOR Take 1 tablet (40 mg total) by mouth daily at 6 PM.   thiamine 100 MG tablet Commonly known as: VITAMIN B-1 Take 100 mg by mouth daily. Take at 1 PM   zinc sulfate 220 (50 Zn) MG capsule Take 220 mg by mouth every other day.       Review of Systems   This is largely unobtainable secondary to dementia-nursing does not report any concerns-when I asked patient if he has any pain or discomfort he replies no  Immunization History  Administered Date(s) Administered  . Fluad Quad(high Dose 65+) 05/23/2019  . Influenza, High Dose Seasonal PF 06/26/2017, 06/17/2018  . Influenza-Unspecified 06/22/2016, 06/26/2017  . Moderna SARS-COVID-2 Vaccination 10/08/2019  . Pneumococcal Polysaccharide-23 01/07/2015, 05/11/2017  . Zoster Recombinat (Shingrix) 12/11/2017, 03/18/2018   Pertinent  Health Maintenance Due  Topic Date Due  . PNA vac Low Risk Adult (2 of 2 - PCV13) 05/11/2018  . INFLUENZA VACCINE  Completed   No flowsheet data found. Functional Status Survey:    Vitals:   11/19/19 1556  BP: 128/72  Pulse: 70  Resp: 18  Temp: 98 F (36.7 C)  TempSrc: Oral  SpO2: 97%  Weight: 134 lb 12.8 oz (61.1 kg)  Height: 6\' 1"  (1.854 m)   Body mass index is 17.78 kg/m. Physical Exam In general this is a somewhat frail elderly male in no distress lying  comfortably in bed he is bright and alert.  His skin is warm and dry he does have solar induced changes numerous and scattered which are baseline.  Eyes visual acuity appears to be intact sclera and conjunctive are clear.  Oropharynx clear mucous membranes moist.  Chest is clear to auscultation with shallow air entry there is no labored breathing.  Heart is somewhat distant heart sounds regular rate and rhythm without murmur gallop or rub he does not have significant lower extremity edema.  His abdomen is soft nontender with positive bowel sounds.  Musculoskeletal appears able to move all extremities x4 at baseline with generalized stiffness especially lower extremities.  Neurologic he is alert cannot appreciate true lateralizing findings he does not have a tremor-does have diffuse weakness.  He speaks a little bit.  Psych findings consistent with significant dementia he  is pleasant cooperative and does follow verbal commands most of the time.    Labs reviewed:  November 02, 2019.  WBC 7.1 hemoglobin 8.3 platelets 176.  Sodium 140 potassium 4.3 BUN 39.2 creatinine 1.53.   Recent Labs    04/22/19 0441 04/22/19 0441 04/23/19 0428 05/20/19 1140 05/20/19 1900 05/22/19 0110 09/01/19 1645 09/01/19 1645 09/02/19 0422 09/03/19 0523 09/29/19 0000  NA 142   < > 143   < >  --    < > 137   < > 138 137 142  K 3.0*   < > 4.0   < >  --    < > 4.2   < > 4.6 4.4 3.8  CL 104   < > 110   < >  --    < > 103   < > 107 104 104  CO2 29   < > 26   < >  --    < > 25   < > 21* 24 24*  GLUCOSE 104*   < > 102*   < >  --    < > 108*  --  101* 92  --   BUN 28*   < > 28*   < >  --    < > 39*   < > 36* 33* 39*  CREATININE 1.84*   < > 1.73*   < >  --    < > 1.61*   < > 1.54* 1.23 1.5*  CALCIUM 7.9*   < > 7.9*   < >  --    < > 9.0   < > 8.9 9.3 10.0  MG 1.7  --  1.7  --  1.8  --   --   --   --   --   --    < > = values in this interval not displayed.   Recent Labs    03/30/19 0432  04/20/19 1701 05/20/19 1140  AST 16 35 10*  ALT 5 17 5   ALKPHOS 68 80 56  BILITOT 0.5 0.5 0.3  PROT 5.5* 6.3* 5.6*  ALBUMIN 3.2* 3.3* 2.9*   Recent Labs    06/01/19 1957 06/26/19 0000 09/01/19 1645 09/01/19 1645 09/02/19 0422 09/03/19 0523 09/29/19 0000  WBC   < > 7.1 6.1   < > 7.9 7.5 7.4  NEUTROABS  --  5 4.0  --   --  4.8  --   HGB   < > 10.1* 6.8*   < > 8.1* 9.6* 9.6*  HCT   < > 30* 23.0*   < > 27.1* 31.0* 29*  MCV   < >  --  89.8  --  92.8 91.4  --   PLT   < > 136* 247   < > 209 211 192   < > = values in this interval not displayed.   Lab Results  Component Value Date   TSH 1.798 05/21/2019   Lab Results  Component Value Date   HGBA1C 4.9 06/26/2019   Lab Results  Component Value Date   CHOL 207 (H) 03/30/2019   HDL 62 03/30/2019   LDLCALC 131 (H) 03/30/2019   TRIG 70 03/30/2019   CHOLHDL 3.3 03/30/2019    Significant Diagnostic Results in last 30 days:  No results found.  Assessment/Plan  #1 history of Parkinson's disease with dementia-emphasis is on supportive care he is on Sinemet 25-253 times a day he is also on Seroquel at night which has  been reduced to 25 mg a day-I believe for history of falls at this point will monitor and continue supportive care.  2.  History of anemia with unknown etiology but apparently recurrent-he has required transfusions at times in the past-will update a hemoglobin-last hemoglobin was 8.3 on February 22.  3.  History of chronic kidney disease this appears relatively stable with a creatinine of 1.53 on February lab will have this updated as well.  4.  History of hypertension as noted above this appears to be pretty stable on Norvasc 2.5 mg a day in addition to the clonidine 0.2 mg 3 times daily with systolics recently in the 1 20-1 40 range.  5.  History of prostate cancer family does not really wish aggressive work-up or treatment at this time.  6.  History of hyperlipidemia he does continue on rosuvastatin 40 mg a day  not stated as uncontrolled.  [id number]

## 2019-11-20 ENCOUNTER — Encounter: Payer: Self-pay | Admitting: Internal Medicine

## 2019-11-20 DIAGNOSIS — I10 Essential (primary) hypertension: Secondary | ICD-10-CM | POA: Diagnosis not present

## 2019-11-20 DIAGNOSIS — D649 Anemia, unspecified: Secondary | ICD-10-CM | POA: Diagnosis not present

## 2019-11-20 LAB — BASIC METABOLIC PANEL
BUN: 49 — AB (ref 4–21)
CO2: 20 (ref 13–22)
Chloride: 105 (ref 99–108)
Creatinine: 2 — AB (ref 0.6–1.3)
Glucose: 84
Glucose: 88
Potassium: 4.7 (ref 3.4–5.3)
Sodium: 140 (ref 137–147)

## 2019-11-20 LAB — CBC AND DIFFERENTIAL
Hemoglobin: 8.2 — AB (ref 13.5–17.5)
Platelets: 152 (ref 150–399)
WBC: 6.5

## 2019-11-20 LAB — CBC: RBC: 2.98 — AB (ref 3.87–5.11)

## 2019-11-20 LAB — COMPREHENSIVE METABOLIC PANEL
Calcium: 9.2 (ref 8.7–10.7)
GFR calc Af Amer: 33.51
GFR calc non Af Amer: 28.92

## 2019-11-23 DIAGNOSIS — J069 Acute upper respiratory infection, unspecified: Secondary | ICD-10-CM | POA: Diagnosis not present

## 2019-11-25 DIAGNOSIS — I10 Essential (primary) hypertension: Secondary | ICD-10-CM | POA: Diagnosis not present

## 2019-11-25 DIAGNOSIS — E559 Vitamin D deficiency, unspecified: Secondary | ICD-10-CM | POA: Diagnosis not present

## 2019-11-25 DIAGNOSIS — E039 Hypothyroidism, unspecified: Secondary | ICD-10-CM | POA: Diagnosis not present

## 2019-11-25 DIAGNOSIS — E119 Type 2 diabetes mellitus without complications: Secondary | ICD-10-CM | POA: Diagnosis not present

## 2019-11-25 DIAGNOSIS — E785 Hyperlipidemia, unspecified: Secondary | ICD-10-CM | POA: Diagnosis not present

## 2019-11-25 DIAGNOSIS — D649 Anemia, unspecified: Secondary | ICD-10-CM | POA: Diagnosis not present

## 2019-11-25 LAB — BASIC METABOLIC PANEL
BUN: 44 — AB (ref 4–21)
CO2: 22 (ref 13–22)
Chloride: 105 (ref 99–108)
Creatinine: 1.5 — AB (ref 0.6–1.3)
Potassium: 4.9 (ref 3.4–5.3)
Sodium: 140 (ref 137–147)

## 2019-11-25 LAB — COMPREHENSIVE METABOLIC PANEL
Albumin: 3.7 (ref 3.5–5.0)
Calcium: 9.2 (ref 8.7–10.7)
GFR calc Af Amer: 48.73
GFR calc non Af Amer: 42.04
Globulin: 1.8

## 2019-11-25 LAB — HEMOGLOBIN A1C: Hemoglobin A1C: 5.7

## 2019-11-25 LAB — LIPID PANEL
Cholesterol: 99 (ref 0–200)
HDL: 50 (ref 35–70)
LDL Cholesterol: 42
Triglycerides: 36 — AB (ref 40–160)

## 2019-11-25 LAB — HEPATIC FUNCTION PANEL
ALT: 8 — AB (ref 10–40)
AST: 16 (ref 14–40)
Alkaline Phosphatase: 61 (ref 25–125)
Bilirubin, Total: 0.2

## 2019-11-25 LAB — CBC: RBC: 2.94 — AB (ref 3.87–5.11)

## 2019-11-25 LAB — CBC AND DIFFERENTIAL
HCT: 24 — AB (ref 41–53)
Hemoglobin: 7.9 — AB (ref 13.5–17.5)
Platelets: 148 — AB (ref 150–399)
WBC: 5.5

## 2019-11-25 LAB — TSH: TSH: 2.21 (ref 0.41–5.90)

## 2019-11-25 LAB — VITAMIN D 25 HYDROXY (VIT D DEFICIENCY, FRACTURES): Vit D, 25-Hydroxy: 55.71

## 2019-11-26 ENCOUNTER — Non-Acute Institutional Stay (SKILLED_NURSING_FACILITY): Payer: Medicare Other | Admitting: Internal Medicine

## 2019-11-26 ENCOUNTER — Encounter: Payer: Self-pay | Admitting: Internal Medicine

## 2019-11-26 DIAGNOSIS — D649 Anemia, unspecified: Secondary | ICD-10-CM

## 2019-11-26 DIAGNOSIS — I10 Essential (primary) hypertension: Secondary | ICD-10-CM

## 2019-11-26 DIAGNOSIS — E559 Vitamin D deficiency, unspecified: Secondary | ICD-10-CM | POA: Diagnosis not present

## 2019-11-26 DIAGNOSIS — E785 Hyperlipidemia, unspecified: Secondary | ICD-10-CM | POA: Diagnosis not present

## 2019-11-26 DIAGNOSIS — N183 Chronic kidney disease, stage 3 unspecified: Secondary | ICD-10-CM

## 2019-11-26 NOTE — Progress Notes (Signed)
Location:    [facility] Room Number: 215/D Place of Service:  SNF [phone]) Provider:  Darrin Nipper, MD  Patient Care Team: Billie Ruddy, MD as PCP - General (Family Medicine)  Extended Emergency Contact Information Primary Emergency Contact: Orpah Melter Address: [place], [place] [address] [name] of [place] Phone: [phone] Mobile Phone: [phone] Relation: Son  Code Status:  DNR Goals of care: Advanced Directive information Advanced Directives 11/26/2019  Does Patient Have a Medical Advance Directive? Yes  Type of Paramedic of [place];Out of facility DNR (pink MOST or yellow form)  Does patient want to make changes to medical advance directive? No - Patient declined  Copy of [place] in Chart? Yes - validated most recent copy scanned in chart (See row information)  Would patient like information on creating a medical advance directive? -  Pre-existing out of facility DNR order (yellow form or pink MOST form) Yellow form placed in chart (order not valid for inpatient use)     Chief Complaint  Patient presents with  . Follow-up    Anemia and Renal Insufficency    HPI:  Pt is a 86 y.o. male seen today for an acute visit for follow-up of labs with a history of renal insufficiency as well as anemia.  Patient is a long-term resident of the facility with a history of Parkinson's disease as well as dementia chronic kidney disease hypertension anemia hyperlipidemia and history of prostate cancer.  He did have a hospitalization fairly recently because his hemoglobin was 6.8 he does have some history of recurrent anemia requiring blood transfusions although etiology is unknown.  Updated labs shows his hemoglobin is 7.9 this is at the lower end of his recent baseline.  He does not show any evidence of bleeding-and mental status appears to be at  baseline.  Will update this in a couple weeks.  Also recent labs showed his creatinine had gone up somewhat at 1.99.  Repeat lab was done and this has come down to 1.46 which is relatively his baseline.  Currently he is lying in bed comfortably nursing does not report any acute issues.  Other labs included an LDL which was unremarkable at 42 he does have a history of hyperlipidemia and continues on rosuvastatin.  His hemoglobin A1c was also stable at 5.7 he is on Seroquel for dementia with behaviors.   Past Medical History:  Diagnosis Date  . Alzheimer's dementia ([place])   . Hypertension   . Parkinson disease ([place])   . Prostate cancer ([place])   . Renal disorder   . Syncope    Past Surgical History:  Procedure Laterality Date  . BACK SURGERY    . IR VERTEBROPLASTY CERV/THOR BX INC UNI/BIL INC/INJECT/IMAGING  01/31/2018  . IR VERTEBROPLASTY CERV/THOR BX INC UNI/BIL INC/INJECT/IMAGING  03/24/2019    No Known Allergies  Outpatient Encounter Medications as of 11/26/2019  Medication Sig  . amLODipine (NORVASC) 2.5 MG tablet Take 1 tablet (2.5 mg total) by mouth daily.  Marland Kitchen ascorbic acid (VITAMIN C) 500 MG tablet Take 500 mg by mouth daily.  Marland Kitchen aspirin 81 MG chewable tablet Chew 1 tablet (81 mg total) by mouth daily.  . carbidopa-levodopa (SINEMET IR) 25-250 MG tablet Take 1 tablet by mouth 3 (three) times daily. 9am, 1pm, 7pm  . Cholecalciferol (D3-50) 1.25 MG (50000 UT) capsule  Take 50,000 Units by mouth every 30 (thirty) days.  . cloNIDine (CATAPRES) 0.2 MG tablet Take 0.2 mg by mouth 3 (three) times daily. 9am, 1pm, 7pm  . feeding supplement, ENSURE ENLIVE, (ENSURE ENLIVE) LIQD Take 237 mLs by mouth 2 (two) times daily between meals.  Marland Kitchen guaiFENesin (MUCINEX) 600 MG 12 hr tablet Take 600 mg by mouth 2 (two) times daily.  . Infant Care Products (BABY SHAMPOO EX) Apply topically. Baby shampo ( less than a dime size ) with warm water can be use for eye hygiene  . Multiple Vitamin  (MULTIVITAMIN WITH MINERALS) TABS tablet Take 1 tablet by mouth daily.  . NON FORMULARY Diet Order: Pureed Diet, no therapeutic restrictions  . omeprazole (PRILOSEC) 40 MG capsule Take 40 mg by mouth daily.  . polyethylene glycol (MIRALAX / GLYCOLAX) 17 g packet Take 17 g by mouth daily as needed for mild constipation.  . QUEtiapine (SEROQUEL) 50 MG tablet Take 25 mg by mouth daily at 6 PM.   . rosuvastatin (CRESTOR) 40 MG tablet Take 1 tablet (40 mg total) by mouth daily at 6 PM.  . thiamine (VITAMIN B-1) 100 MG tablet Take 100 mg by mouth daily. Take at 1 PM  . zinc sulfate 220 (50 Zn) MG capsule Take 220 mg by mouth every other day.   No facility-administered encounter medications on file as of 11/26/2019.    Review of Systems    This is limited secondary to dementia please see HPI when I asked him he is denies any pain or shortness of breath or increased weakness he appears to be at baseline clinically  Nursing does not report any acute concerns  Immunization History  Administered Date(s) Administered  . Fluad Quad(high Dose 65+) 05/23/2019  . Influenza, High Dose Seasonal PF 06/26/2017, 06/17/2018  . Influenza-Unspecified 06/22/2016, 06/26/2017  . Moderna SARS-COVID-2 Vaccination 10/08/2019  . Pneumococcal Polysaccharide-23 01/07/2015, 05/11/2017  . Zoster Recombinat (Shingrix) 12/11/2017, 03/18/2018   Pertinent  Health Maintenance Due  Topic Date Due  . PNA vac Low Risk Adult (2 of 2 - PCV13) 05/11/2018  . INFLUENZA VACCINE  Completed   No flowsheet data found. Functional Status Survey:    Vitals:   11/26/19 1309  BP: (!) 102/59  Pulse: 68  Resp: 18  Temp: 98.4 F (36.9 C)  TempSrc: Oral  SpO2: 97%  Weight: 133 lb 11.2 oz (60.6 kg)  Height: 6\' 1"  (1.854 m)   Body mass index is 17.64 kg/m. Physical Exam   \General this is a somewhat frail elderly male he is in no distress lying comfortably in bed he is bright and alert and does talk some.  His skin is warm  and dry continues with solar induced changes with scattered scaling areas.  Eyes visual acuity appears to be intact sclera and conjunctive are clear.  Oropharynx is clear mucous membranes moist.  Chest is clear to auscultation with somewhat poor respiratory effort and shallow air entry there is no labored breathing.  Heart is distant heart sounds regular rate and rhythm he does not have significant lower extremity edema.  Abdomen is soft nontender with positive bowel sounds.  Musculoskeletal is able to move all extremities x4 with general frailty and some stiffness lower extremities.  Neurologic he is alert could not really appreciate a tremor this afternoon-could not really appreciate lateralizing findings.  Psych he is pleasant he does follow simple verbal commands he does talk some-he did ask me what my name was and repeated it correctly when  he repeated it to me    Labs reviewed:  November 25, 2019.  Sodium 140 potassium 4.9 BUN 44 creatinine 1.46.  Albumin was 3.7.  Liver function tests within normal limits.  WBC 5.5 hemoglobin 7.9 platelets 148.  TSH was 2.21.  Hemoglobin A1c 5.7.  LDL was 42.  Vitamin D level was 55.71.   Recent Labs    04/22/19 0441 04/22/19 0441 04/23/19 0428 05/20/19 1140 05/20/19 1900 05/22/19 0110 09/01/19 1645 09/01/19 1645 09/02/19 0422 09/03/19 0523 09/29/19 0000  NA 142   < > 143   < >  --    < > 137   < > 138 137 142  K 3.0*   < > 4.0   < >  --    < > 4.2   < > 4.6 4.4 3.8  CL 104   < > 110   < >  --    < > 103   < > 107 104 104  CO2 29   < > 26   < >  --    < > 25   < > 21* 24 24*  GLUCOSE 104*   < > 102*   < >  --    < > 108*  --  101* 92  --   BUN 28*   < > 28*   < >  --    < > 39*   < > 36* 33* 39*  CREATININE 1.84*   < > 1.73*   < >  --    < > 1.61*   < > 1.54* 1.23 1.5*  CALCIUM 7.9*   < > 7.9*   < >  --    < > 9.0   < > 8.9 9.3 10.0  MG 1.7  --  1.7  --  1.8  --   --   --   --   --   --    < > = values in this  interval not displayed.   Recent Labs    03/30/19 0432 04/20/19 1701 05/20/19 1140  AST 16 35 10*  ALT 5 17 5   ALKPHOS 68 80 56  BILITOT 0.5 0.5 0.3  PROT 5.5* 6.3* 5.6*  ALBUMIN 3.2* 3.3* 2.9*   Recent Labs    06/01/19 1957 06/26/19 0000 09/01/19 1645 09/01/19 1645 09/02/19 0422 09/03/19 0523 09/29/19 0000  WBC   < > 7.1 6.1   < > 7.9 7.5 7.4  NEUTROABS  --  5 4.0  --   --  4.8  --   HGB   < > 10.1* 6.8*   < > 8.1* 9.6* 9.6*  HCT   < > 30* 23.0*   < > 27.1* 31.0* 29*  MCV   < >  --  89.8  --  92.8 91.4  --   PLT   < > 136* 247   < > 209 211 192   < > = values in this interval not displayed.   Lab Results  Component Value Date   TSH 1.798 05/21/2019   Lab Results  Component Value Date   HGBA1C 4.9 06/26/2019   Lab Results  Component Value Date   CHOL 207 (H) 03/30/2019   HDL 62 03/30/2019   LDLCALC 131 (H) 03/30/2019   TRIG 70 03/30/2019   CHOLHDL 3.3 03/30/2019    Significant Diagnostic Results in last 30 days:  No results found.  Assessment/Plan  #1 history of anemia of unclear  etiology hemoglobin is at the lower end of his baseline at 7.9 will have this updated in couple weeks.  At one point his hemoglobin was below 7 and he did go to the hospital for transfusion.  2.  History of renal insufficiency chronic kidney disease creatinine now appears to be back at his baseline at 1.46 we will continue to monitor periodically.  Continue to encourage fluids.  3.  Vitamin D deficiency vitamin D level was adequate at 55.7 he is on supplementation.  4.  Hyperlipidemia he is on rosuvastatin 40 mg a day and this appears stable his LDL was 42.  5.  Hypertension he is on  medications including clonidine 0.2 mg twice daily and Norvasc 2.5 mg a day--continues to have some variability recent systolics range from [id number] up to 166 I do not see consistent elevations at this point will monitor.  Clinically he appears to be at baseline continues to be pleasant somewhat  confused but actually quite alert today when I spoke with him-  [phone] note greater than 25 minutes spent assessing patient-reviewing his chart including his lab history -and coordinating a plan of care

## 2019-12-09 DIAGNOSIS — D649 Anemia, unspecified: Secondary | ICD-10-CM | POA: Diagnosis not present

## 2019-12-09 DIAGNOSIS — I10 Essential (primary) hypertension: Secondary | ICD-10-CM | POA: Diagnosis not present

## 2019-12-09 LAB — CBC AND DIFFERENTIAL
HCT: 25 — AB (ref 41–53)
Hemoglobin: 8 — AB (ref 13.5–17.5)
Platelets: 150 (ref 150–399)
WBC: 6.3

## 2019-12-09 LAB — CBC: RBC: 2.97 — AB (ref 3.87–5.11)

## 2019-12-12 ENCOUNTER — Emergency Department (HOSPITAL_COMMUNITY)
Admission: EM | Admit: 2019-12-12 | Discharge: 2019-12-12 | Disposition: A | Discharge status: Home / Self Care | Payer: Medicare Other | Attending: Emergency Medicine | Admitting: Emergency Medicine

## 2019-12-12 DIAGNOSIS — S51012A Laceration without foreign body of left elbow, initial encounter: Secondary | ICD-10-CM | POA: Insufficient documentation

## 2019-12-12 DIAGNOSIS — N183 Chronic kidney disease, stage 3 unspecified: Secondary | ICD-10-CM | POA: Diagnosis not present

## 2019-12-12 DIAGNOSIS — Z79899 Other long term (current) drug therapy: Secondary | ICD-10-CM | POA: Insufficient documentation

## 2019-12-12 DIAGNOSIS — I129 Hypertensive chronic kidney disease with stage 1 through stage 4 chronic kidney disease, or unspecified chronic kidney disease: Secondary | ICD-10-CM | POA: Diagnosis not present

## 2019-12-12 DIAGNOSIS — Z7982 Long term (current) use of aspirin: Secondary | ICD-10-CM | POA: Insufficient documentation

## 2019-12-12 DIAGNOSIS — I499 Cardiac arrhythmia, unspecified: Secondary | ICD-10-CM | POA: Diagnosis not present

## 2019-12-12 DIAGNOSIS — Y939 Activity, unspecified: Secondary | ICD-10-CM | POA: Diagnosis not present

## 2019-12-12 DIAGNOSIS — Y999 Unspecified external cause status: Secondary | ICD-10-CM | POA: Diagnosis not present

## 2019-12-12 DIAGNOSIS — R5381 Other malaise: Secondary | ICD-10-CM | POA: Diagnosis not present

## 2019-12-12 DIAGNOSIS — R279 Unspecified lack of coordination: Secondary | ICD-10-CM | POA: Diagnosis not present

## 2019-12-12 DIAGNOSIS — Z87891 Personal history of nicotine dependence: Secondary | ICD-10-CM | POA: Diagnosis not present

## 2019-12-12 DIAGNOSIS — W06XXXA Fall from bed, initial encounter: Secondary | ICD-10-CM | POA: Insufficient documentation

## 2019-12-12 DIAGNOSIS — W19XXXA Unspecified fall, initial encounter: Secondary | ICD-10-CM

## 2019-12-12 DIAGNOSIS — G309 Alzheimer's disease, unspecified: Secondary | ICD-10-CM | POA: Insufficient documentation

## 2019-12-12 DIAGNOSIS — R58 Hemorrhage, not elsewhere classified: Secondary | ICD-10-CM | POA: Diagnosis not present

## 2019-12-12 DIAGNOSIS — I447 Left bundle-branch block, unspecified: Secondary | ICD-10-CM | POA: Diagnosis not present

## 2019-12-12 DIAGNOSIS — I10 Essential (primary) hypertension: Secondary | ICD-10-CM | POA: Diagnosis not present

## 2019-12-12 DIAGNOSIS — Y929 Unspecified place or not applicable: Secondary | ICD-10-CM | POA: Diagnosis not present

## 2019-12-12 DIAGNOSIS — Z743 Need for continuous supervision: Secondary | ICD-10-CM | POA: Diagnosis not present

## 2019-12-12 DIAGNOSIS — I213 ST elevation (STEMI) myocardial infarction of unspecified site: Secondary | ICD-10-CM | POA: Diagnosis not present

## 2019-12-12 NOTE — ED Triage Notes (Signed)
Pt arrives via ems from MD office with arm injury to L forearm. Per ems, pt rolled out of his bed today and has a skin tear to LFA. Pt went to PCP and PCP concerned that she may be able to see tendons? Bleeding controlled on arrival. Pt also with some abrasions to L clavicle area and to the bridge of his nose. No blood thinners. No LOC. Hx dementia, alert to self and place. This is pt baseline per ems. Arrives in [place]lar.  190/86 HR 88 RR 16 97.62F 98% RA

## 2019-12-12 NOTE — Discharge Instructions (Addendum)
1.  Leave the dressing in place over your skin tear for the next 4 days.  You may remove the outer dressing if it gets bloody drainage on it.  In 4 days you may change the yellow dressing and put antibiotic ointment and a nonstick gauze on your wound.  If the yellow dressing is sticking to the wound, run it under warm water until it loosens and peels away without difficulty. 2.  Return to the emergency department if you develop redness or swelling or drainage from the wound that looks like infection.  Return if you get fever chills or other concerning symptoms.

## 2019-12-12 NOTE — ED Provider Notes (Signed)
[facility]ERGENCY DEPARTMENT Provider Note   CSN: [id number] Arrival date & time: 12/12/19  1322     History Chief Complaint  Patient presents with  . Arm Injury    Jose Baldwin is a 86 y.o. male.  HPI Patient is sent with a wound to the left arm from a fall.  At PCP office there was concern for deeper wound and uncontrolled bleeding.  Patient rolled out of his bed today and sustained a skin tear injury to the left forearm.  Patient is not on blood thinners.  PCP also noted an abrasion to the nose and the clavicle.  Patient was transferred to the ED by EMS from PCP office with cervical collar in place.  Patient is alert and interactive.  He is answering questions that are situationally appropriate.  He is aware that he fell.  He denies any headache or head pain.  He denies any pain in his neck.  Patient denies significant pain to the elbow.    Past Medical History:  Diagnosis Date  . Alzheimer's dementia ([place])   . Hypertension   . Parkinson disease ([place])   . Prostate cancer ([place])   . Renal disorder   . Syncope     Patient Active Problem List   Diagnosis Date Noted  . Severe anemia 09/01/2019  . Dementia with behavioral disturbance ([place]) 05/30/2019  . Hyperlipidemia 05/30/2019  . Bradycardia 05/22/2019  . Goals of care, counseling/discussion   . Palliative care by specialist   . Uncontrolled hypertension 05/21/2019  . Normocytic anemia 05/21/2019  . CKD (chronic kidney disease), stage III 05/21/2019  . Physical deconditioning 05/21/2019  . Hematuria 05/21/2019  . UTI (urinary tract infection) 05/20/2019  . Hypokalemia 04/20/2019  . Hypomagnesemia 04/20/2019  . Parkinson disease ([place]) 04/20/2019  . Cardiac arrhythmia 04/20/2019  . Acute renal failure superimposed on stage 3 chronic kidney disease ([place]) 04/20/2019  . Protein-calorie malnutrition, severe 03/31/2019  . Cerebral embolism with cerebral infarction 03/30/2019  . POTS (postural  orthostatic tachycardia syndrome) 03/28/2019  . HTN (hypertension) 02/04/2018  . Prostate cancer ([place]) 02/04/2018  . Orthostatic hypotension 02/04/2018  . Iron deficiency anemia 02/04/2018  . Syncope 02/02/2018    Past Surgical History:  Procedure Laterality Date  . BACK SURGERY    . IR VERTEBROPLASTY CERV/THOR BX INC UNI/BIL INC/INJECT/IMAGING  01/31/2018  . IR VERTEBROPLASTY CERV/THOR BX INC UNI/BIL INC/INJECT/IMAGING  03/24/2019       Family History  Problem Relation Age of Onset  . Heart attack Father 23    Social History   Tobacco Use  . Smoking status: Former Smoker    Packs/day: 2.00    Years: 30.00    Pack years: 60.00    Types: Cigarettes    Quit date: 1990    Years since quitting: 31.2  . Smokeless tobacco: Never Used  Substance Use Topics  . Alcohol use: Yes    Alcohol/week: 14.0 standard drinks    Types: 14 Cans of beer per week  . Drug use: Never    Home Medications Prior to Admission medications   Medication Sig Start Date End Date Taking? Authorizing Provider  amLODipine (NORVASC) 2.5 MG tablet Take 1 tablet (2.5 mg total) by mouth daily. 05/26/19   Hongalgi, Lenis Dickinson, MD  ascorbic acid (VITAMIN C) 500 MG tablet Take 500 mg by mouth daily.    [provider]  aspirin 81 MG chewable tablet Chew 1 tablet (81 mg total) by mouth daily. 04/01/19  Nita Sells, MD  carbidopa-levodopa (SINEMET IR) 25-250 MG tablet Take 1 tablet by mouth 3 (three) times daily. 9am, 1pm, 7pm    [provider]  Cholecalciferol (D3-50) 1.25 MG (50000 UT) capsule Take 50,000 Units by mouth every 30 (thirty) days.    [provider]  cloNIDine (CATAPRES) 0.2 MG tablet Take 0.2 mg by mouth 3 (three) times daily. 9am, 1pm, 7pm    [provider]  feeding supplement, ENSURE ENLIVE, (ENSURE ENLIVE) LIQD Take 237 mLs by mouth 2 (two) times daily between meals. 05/25/19   Hongalgi, Lenis Dickinson, MD  guaiFENesin (MUCINEX) 600 MG 12 hr tablet Take 600 mg  by mouth 2 (two) times daily.    [provider]  Infant Care Products (BABY SHAMPOO EX) Apply topically. Baby shampo ( less than a dime size ) with warm water can be use for eye hygiene    [provider]  Multiple Vitamin (MULTIVITAMIN WITH MINERALS) TABS tablet Take 1 tablet by mouth daily. 05/26/19   Hongalgi, Lenis Dickinson, MD  NON FORMULARY Diet Order: Pureed Diet, no therapeutic restrictions    [provider]  omeprazole (PRILOSEC) 40 MG capsule Take 40 mg by mouth daily.    [provider]  polyethylene glycol (MIRALAX / GLYCOLAX) 17 g packet Take 17 g by mouth daily as needed for mild constipation. 04/23/19   Aline August, MD  QUEtiapine (SEROQUEL) 50 MG tablet Take 25 mg by mouth daily at 6 PM.     [provider]  rosuvastatin (CRESTOR) 40 MG tablet Take 1 tablet (40 mg total) by mouth daily at 6 PM. 04/23/19   Aline August, MD  thiamine (VITAMIN B-1) 100 MG tablet Take 100 mg by mouth daily. Take at 1 PM    [provider]  zinc sulfate 220 (50 Zn) MG capsule Take 220 mg by mouth every other day.    [provider]    Allergies    Patient has no known allergies.  Review of Systems   Review of Systems Level 5 caveat cannot obtain review of systems due to dementia and poor recall Physical Exam Updated Vital Signs BP (!) 158/60   Pulse 100   Temp 98.4 F (36.9 C) (Oral)   Resp 14   SpO2 95%   Physical Exam Constitutional:      Comments: Patient is alert.  He has a cervical collar in place.  He has no respiratory distress.  He is attentive and answering questions.  HENT:     Head:     Comments: Minor superficial abrasion to the bridge of the nose with no bleeding or skin tear.  Contusions or hematomas to the scalp or face.    Nose:     Comments: Nares patent with no blood present.    Mouth/Throat:     Mouth: Mucous membranes are moist.     Pharynx: Oropharynx is clear.  Eyes:     Extraocular Movements:  Extraocular movements intact.  Neck:     Comments: Patient denies any C-spine tenderness to palpation.  He has a large mobile subcutaneous cyst on the back of the neck.  Extensive arthritic changes and curvature of the neck. Cardiovascular:     Rate and Rhythm: Normal rate.     Pulses: Normal pulses.     Comments: Heart is regular.  Monitor shows interventricular conduction delay without significant ectopy. Pulmonary:     Breath sounds: Normal breath sounds.     Comments: Denies any  tenderness to compression of the chest wall.  No crepitus.  No contusions or abrasions to the thoracic back. Abdominal:     General: There is no distension.     Palpations: Abdomen is soft.     Tenderness: There is no abdominal tenderness. There is no guarding.  Musculoskeletal:     Comments: Patient has a skin tear to the lateral aspect of the left elbow.  Retracting this there is 1 focus of slightly deeper puncture just to deeper subcu tissue.  Predominantly this is a superficial skin tear.  See attached images.  Patient does not have any pain with range of motion of the elbow or the shoulder.  Both lower extremities were put through flexion and extension.  I had patient flex at the hip knee and ankle and push me away with both legs.  He denies any pain to his back or extremities with this maneuver.  Skin:    General: Skin is warm and dry.  Neurological:     Comments: Patient is alert.  He is interactive.  His speech is clear and situationally appropriate.  He is following commands to assist in motor testing and range of motion testing for the extremities.  Psychiatric:        Mood and Affect: Mood normal.       ED Results / Procedures / Treatments   Labs (all labs ordered are listed, but only abnormal results are displayed) Labs Reviewed - No data to display  EKG EKG Interpretation  Date/Time:  Saturday December 12 2019 13:30:06 EDT Ventricular Rate:  87 PR Interval:    QRS Duration: 136 QT  Interval:  390 QTC Calculation: 470 R Axis:   -67 Text Interpretation: Atrial fibrillation Ventricular premature complex Left bundle branch block no change from previous Confirmed by Charlesetta Shanks [phone]) on 12/12/2019 1:42:49 PM   Radiology No results found.  Procedures Procedures (including critical care time)  Medications Ordered in ED Medications - No data to display  ED Course  I have reviewed the triage vital signs and the nursing notes.  Pertinent labs & imaging results that were available during my care of the patient were reviewed by me and considered in my medical decision making (see chart for details).    MDM Rules/Calculators/A&P                     Patient is alert.  Physical exam does not suggest significant head injury or cervical spine injury.  Patient is not on anticoagulants.  He has been under observation in the emergency department for 3 hours with consistently alert and appropriate behavior.  Skin tear was cleaned and dressed.  No suture required.  Patient will be returned to home with return precautions outlined. Final Clinical Impression(s) / ED Diagnoses Final diagnoses:  Fall, initial encounter  Skin tear of left elbow without complication, initial encounter    Rx / DC Orders ED Discharge Orders    None       Charlesetta Shanks, MD 12/12/19 1640

## 2019-12-14 ENCOUNTER — Telehealth: Payer: Self-pay | Admitting: Internal Medicine

## 2019-12-14 DIAGNOSIS — H353231 Exudative age-related macular degeneration, bilateral, with active choroidal neovascularization: Secondary | ICD-10-CM | POA: Diagnosis not present

## 2019-12-14 DIAGNOSIS — H43813 Vitreous degeneration, bilateral: Secondary | ICD-10-CM | POA: Diagnosis not present

## 2019-12-14 DIAGNOSIS — H35371 Puckering of macula, right eye: Secondary | ICD-10-CM | POA: Diagnosis not present

## 2019-12-14 DIAGNOSIS — H43393 Other vitreous opacities, bilateral: Secondary | ICD-10-CM | POA: Diagnosis not present

## 2019-12-14 NOTE — Telephone Encounter (Signed)
He got up out of bed to go to the bathroom and fell hitting the side of his head and his neck near his carotid on the trash can--small laceration with abrasion beneath, but large lac on elbow will not cease bleeding and needs attention at ED--was given order to send out sat early afternoon.  VSS.  Evelyne Makepeace L. Rufus Beske, D.O. [facility] [address], [place] [address] Cell Phone (Mon-Fri 8am-5pm):  [phone] On Call:  [phone] & follow prompts after 5pm & weekends Office Phone:  [phone] Office Fax:  [phone]

## 2019-12-23 ENCOUNTER — Encounter: Payer: Self-pay | Admitting: Internal Medicine

## 2019-12-23 NOTE — Progress Notes (Signed)
Location:    [facility] Room Number: 215/D Place of Service:  SNF (31) Provider:  Christie Nottingham, MD  Patient Care Team: Billie Ruddy, MD as PCP - General (Family Medicine)  Extended Emergency Contact Information Primary Emergency Contact: Orpah Melter Address: [place], [place] [address] [name] of [place] Phone: [phone] Mobile Phone: [phone] Relation: Son  Code Status:  DNR Goals of care: Advanced Directive information Advanced Directives 12/23/2019  Does Patient Have a Medical Advance Directive? Yes  Type of Paramedic of [place];Out of facility DNR (pink MOST or yellow form)  Does patient want to make changes to medical advance directive? No - Patient declined  Copy of [place] in Chart? Yes - validated most recent copy scanned in chart (See row information)  Would patient like information on creating a medical advance directive? -  Pre-existing out of facility DNR order (yellow form or pink MOST form) Yellow form placed in chart (order not valid for inpatient use)     Chief Complaint  Patient presents with  . Medical Management of Chronic Issues    Routine visit of medical management    HPI:  Pt is a 86 y.o. male seen today for medical management of chronic diseases.     Past Medical History:  Diagnosis Date  . Alzheimer's dementia ([place])   . Hypertension   . Parkinson disease ([place])   . Prostate cancer ([place])   . Renal disorder   . Syncope    Past Surgical History:  Procedure Laterality Date  . BACK SURGERY    . IR VERTEBROPLASTY CERV/THOR BX INC UNI/BIL INC/INJECT/IMAGING  01/31/2018  . IR VERTEBROPLASTY CERV/THOR BX INC UNI/BIL INC/INJECT/IMAGING  03/24/2019    No Known Allergies  Allergies as of 12/23/2019   No Known Allergies     Medication List       Accurate as of December 23, 2019  9:25 AM. If you have  any questions, ask your nurse or doctor.        STOP taking these medications   zinc sulfate 220 (50 Zn) MG capsule Stopped by: Granville Lewis, PA-C     TAKE these medications   amLODipine 2.5 MG tablet Commonly known as: NORVASC Take 1 tablet (2.5 mg total) by mouth daily.   ARTIFICIAL TEARS OP Apply to eye. 1 drop into both eye as needed 4 times daily   ascorbic acid 500 MG tablet Commonly known as: VITAMIN C Take 500 mg by mouth daily.   aspirin 81 MG chewable tablet Chew 1 tablet (81 mg total) by mouth daily.   BABY SHAMPOO EX Apply topically. Baby shampo ( less than a dime size ) with warm water can be use for eye hygiene   carbidopa-levodopa 25-250 MG tablet Commonly known as: SINEMET IR Take 1 tablet by mouth 3 (three) times daily. 9am, 1pm, 7pm   cloNIDine 0.2 MG tablet Commonly known as: CATAPRES Take 0.2 mg by mouth 3 (three) times daily. 9am, 1pm, 7pm   D3-50 1.25 MG (50000 UT) capsule Generic drug: Cholecalciferol Take 50,000 Units by mouth every 30 (thirty) days.   feeding supplement (ENSURE ENLIVE) Liqd Take 237 mLs by mouth 2 (two) times daily between meals.   guaiFENesin 600 MG 12 hr tablet Commonly known as: MUCINEX Take 600 mg by mouth 2 (two) times daily.  multivitamin with minerals Tabs tablet Take 1 tablet by mouth daily.   NON FORMULARY Diet Order: Pureed Diet, no therapeutic restrictions   omeprazole 40 MG capsule Commonly known as: PRILOSEC Take 40 mg by mouth daily.   polyethylene glycol 17 g packet Commonly known as: MIRALAX / GLYCOLAX Take 17 g by mouth daily as needed for mild constipation.   QUEtiapine 50 MG tablet Commonly known as: SEROQUEL Take 25 mg by mouth daily at 6 PM.   rosuvastatin 40 MG tablet Commonly known as: CRESTOR Take 1 tablet (40 mg total) by mouth daily at 6 PM.   thiamine 100 MG tablet Commonly known as: Vitamin B-1 Take 100 mg by mouth daily. Take at 1 PM       Review of Systems  Immunization  History  Administered Date(s) Administered  . Fluad Quad(high Dose 65+) 05/23/2019  . Influenza, High Dose Seasonal PF 06/26/2017, 06/17/2018  . Influenza-Unspecified 06/22/2016, 06/26/2017  . Moderna SARS-COVID-2 Vaccination 10/08/2019, 11/05/2019  . Pneumococcal Polysaccharide-23 01/07/2015, 05/11/2017  . Zoster Recombinat (Shingrix) 12/11/2017, 03/18/2018   Pertinent  Health Maintenance Due  Topic Date Due  . PNA vac Low Risk Adult (2 of 2 - PCV13) 05/11/2018  . INFLUENZA VACCINE  04/10/2020   No flowsheet data found. Functional Status Survey:    Vitals:   12/23/19 0916  BP: 124/70  Pulse: 80  Resp: 16  Temp: 97.7 F (36.5 C)  TempSrc: Oral  SpO2: 97%  Weight: 133 lb 11.2 oz (60.6 kg)  Height: 6\' 1"  (1.854 m)   Body mass index is 17.64 kg/m. Physical Exam  Labs reviewed: Recent Labs    04/22/19 0441 04/22/19 0441 04/23/19 0428 05/20/19 1140 05/20/19 1900 05/22/19 0110 09/01/19 1645 09/01/19 1645 09/02/19 0422 09/03/19 0523 09/29/19 0000  NA 142   < > 143   < >  --    < > 137   < > 138 137 142  K 3.0*   < > 4.0   < >  --    < > 4.2   < > 4.6 4.4 3.8  CL 104   < > 110   < >  --    < > 103   < > 107 104 104  CO2 29   < > 26   < >  --    < > 25   < > 21* 24 24*  GLUCOSE 104*   < > 102*   < >  --    < > 108*  --  101* 92  --   BUN 28*   < > 28*   < >  --    < > 39*   < > 36* 33* 39*  CREATININE 1.84*   < > 1.73*   < >  --    < > 1.61*   < > 1.54* 1.23 1.5*  CALCIUM 7.9*   < > 7.9*   < >  --    < > 9.0   < > 8.9 9.3 10.0  MG 1.7  --  1.7  --  1.8  --   --   --   --   --   --    < > = values in this interval not displayed.   Recent Labs    03/30/19 0432 04/20/19 1701 05/20/19 1140  AST 16 35 10*  ALT 5 17 5   ALKPHOS 68 80 56  BILITOT 0.5 0.5 0.3  PROT 5.5* 6.3* 5.6*  ALBUMIN 3.2* 3.3* 2.9*   Recent Labs    06/01/19 1957 06/26/19 0000 09/01/19 1645 09/01/19 1645 09/02/19 0422 09/03/19 0523 09/29/19 0000  WBC   < > 7.1 6.1   < > 7.9 7.5 7.4    NEUTROABS  --  5 4.0  --   --  4.8  --   HGB   < > 10.1* 6.8*   < > 8.1* 9.6* 9.6*  HCT   < > 30* 23.0*   < > 27.1* 31.0* 29*  MCV   < >  --  89.8  --  92.8 91.4  --   PLT   < > 136* 247   < > 209 211 192   < > = values in this interval not displayed.   Lab Results  Component Value Date   TSH 1.798 05/21/2019   Lab Results  Component Value Date   HGBA1C 4.9 06/26/2019   Lab Results  Component Value Date   CHOL 207 (H) 03/30/2019   HDL 62 03/30/2019   LDLCALC 131 (H) 03/30/2019   TRIG 70 03/30/2019   CHOLHDL 3.3 03/30/2019    Significant Diagnostic Results in last 30 days:  No results found.  Assessment/Plan There are no diagnoses linked to this encounter.   Family/ staff Communication:   Labs/tests ordered:      This encounter was created in error - please disregard.

## 2019-12-24 ENCOUNTER — Non-Acute Institutional Stay (SKILLED_NURSING_FACILITY): Payer: Medicare Other | Admitting: Internal Medicine

## 2019-12-24 ENCOUNTER — Encounter: Payer: Self-pay | Admitting: Internal Medicine

## 2019-12-24 DIAGNOSIS — I10 Essential (primary) hypertension: Secondary | ICD-10-CM

## 2019-12-24 DIAGNOSIS — D649 Anemia, unspecified: Secondary | ICD-10-CM | POA: Diagnosis not present

## 2019-12-24 DIAGNOSIS — G20 Parkinson's disease: Secondary | ICD-10-CM | POA: Diagnosis not present

## 2019-12-24 DIAGNOSIS — N183 Chronic kidney disease, stage 3 unspecified: Secondary | ICD-10-CM | POA: Diagnosis not present

## 2019-12-24 DIAGNOSIS — F0281 Dementia in other diseases classified elsewhere with behavioral disturbance: Secondary | ICD-10-CM

## 2019-12-24 NOTE — Progress Notes (Signed)
Location:    [facility] Room Number: 215/D Place of Service:  SNF (31) Provider:  Christie Nottingham, MD  Patient Care Team: Billie Ruddy, MD as PCP - General (Family Medicine)  Extended Emergency Contact Information Primary Emergency Contact: Orpah Melter Address: [place], [place] [address] [name] of [place] Phone: [phone] Mobile Phone: [phone] Relation: Son  Code Status:  DNR Goals of care: Advanced Directive information Advanced Directives 12/24/2019  Does Patient Have a Medical Advance Directive? Yes  Type of Par[place]ic of [place];Out of facility DNR (pink MOST or yellow form)  Does patient want to make changes to medical advance directive? No - Patient declined  Copy of [place] in Chart? Yes - validated most recent copy scanned in chart (See row information)  Would patient like information on creating a medical advance directive? -  Pre-existing out of facility DNR order (yellow form or pink MOST form) Yellow form placed in chart (order not valid for inpatient use)     Chief Complaint  Patient presents with  . Medical Management of Chronic Issues    Routine visit of medical management   Medical management of chronic medical conditionsIncluding chronic kidney disease-anemia Parkinson's disease-dementia-hypertension-hyperlipidemia-history of prostate cancer  HPI:  Pt is a 86 y.o. male seen today for medical management of chronic diseases.  As noted above.  Most recent acute issue was a fall which required him to go to the ER-l he sustained a laceration but this was thought not to need sutures.  Work-up was negative for any acute process.  He does have a history of recurrent anemia requiring blood transfusions hemoglobin however appears relatively stable at 8.0 lab done on December 09, 2019.  He has a history of progressive  Parkinson's disease with dementia he is on Sinemet 25-253 times a day.  He also is on Seroquel 25 mg at night this was recently reduced because of his fall risk.  He does have a history of hypertension and continues on clonidine 0.2 mg 3 times daily as well as Norvasc 2.5 mg a day-recent blood pressures 128/66-124/70-122/68-occasionally has a spike systolically but this does not appear to be consistent.  Currently he is sitting on the side of his bed he does not really have any complaints he is pleasant and cooperative   Past Medical History:  Diagnosis Date  . Alzheimer's dementia ([place])   . Hypertension   . Parkinson disease ([place])   . Prostate cancer ([place])   . Renal disorder   . Syncope    Past Surgical History:  Procedure Laterality Date  . BACK SURGERY    . IR VERTEBROPLASTY CERV/THOR BX INC UNI/BIL INC/INJECT/IMAGING  01/31/2018  . IR VERTEBROPLASTY CERV/THOR BX INC UNI/BIL INC/INJECT/IMAGING  03/24/2019    No Known Allergies  Allergies as of 12/24/2019   No Known Allergies     Medication List       Accurate as of December 24, 2019  9:38 AM. If you have any questions, ask your nurse or doctor.        amLODipine 2.5 MG tablet Commonly known as: NORVASC Take 1 tablet (2.5 mg total) by mouth daily.   ARTIFICIAL TEARS OP Apply to eye. 1 drop into both eye as needed 4 times daily   ascorbic acid 500 MG tablet Commonly known as: VITAMIN C Take 500  mg by mouth daily.   aspirin 81 MG chewable tablet Chew 1 tablet (81 mg total) by mouth daily.   BABY SHAMPOO EX Apply topically. Baby shampo ( less than a dime size ) with warm water can be use for eye hygiene   carbidopa-levodopa 25-250 MG tablet Commonly known as: SINEMET IR Take 1 tablet by mouth 3 (three) times daily. 9am, 1pm, 7pm   cloNIDine 0.2 MG tablet Commonly known as: CATAPRES Take 0.2 mg by mouth 3 (three) times daily. 9am, 1pm, 7pm   D3-50 1.25 MG (50000 UT) capsule Generic drug: Cholecalciferol Take  50,000 Units by mouth every 30 (thirty) days.   feeding supplement (ENSURE ENLIVE) Liqd Take 237 mLs by mouth 2 (two) times daily between meals.   guaiFENesin 600 MG 12 hr tablet Commonly known as: MUCINEX Take 600 mg by mouth 2 (two) times daily.   multivitamin with minerals Tabs tablet Take 1 tablet by mouth daily.   NON FORMULARY Diet Order: Pureed Diet, no therapeutic restrictions   omeprazole 40 MG capsule Commonly known as: PRILOSEC Take 40 mg by mouth daily.   polyethylene glycol 17 g packet Commonly known as: MIRALAX / GLYCOLAX Take 17 g by mouth daily as needed for mild constipation.   QUEtiapine 50 MG tablet Commonly known as: SEROQUEL Take 25 mg by mouth daily at 6 PM.   rosuvastatin 40 MG tablet Commonly known as: CRESTOR Take 1 tablet (40 mg total) by mouth daily at 6 PM.   thiamine 100 MG tablet Commonly known as: Vitamin B-1 Take 100 mg by mouth daily. Take at 1 PM       Review of Systems   This is  limited secondary to dementia please see HPI  Immunization History  Administered Date(s) Administered  . Fluad Quad(high Dose 65+) 05/23/2019  . Influenza, High Dose Seasonal PF 06/26/2017, 06/17/2018  . Influenza-Unspecified 06/22/2016, 06/26/2017  . Moderna SARS-COVID-2 Vaccination 10/08/2019, 11/05/2019  . Pneumococcal Polysaccharide-23 01/07/2015, 05/11/2017  . Zoster Recombinat (Shingrix) 12/11/2017, 03/18/2018   Pertinent  Health Maintenance Due  Topic Date Due  . PNA vac Low Risk Adult (2 of 2 - PCV13) 05/11/2018  . INFLUENZA VACCINE  04/10/2020   No flowsheet data found. Functional Status Survey:    Vitals:   12/24/19 0935  BP: 128/66  Pulse: 67  Resp: 18  Temp: (!) 97 F (36.1 C)  TempSrc: Oral  SpO2: 97%  Weight: 133 lb 11.2 oz (60.6 kg)  Height: 6\' 1"  (1.854 m)   Body mass index is 17.64 kg/m. Physical Exam   In general this is a frail elderly male in no distress.  His skin is warm and dry he does have numerous solar  induced changes of his skin most prominently extremities. He has wrapping over his left elbow which was site of laceration this is followed by wound care  Eyes visual acuity appears to be intact sclera and conjunctive are clear.  Oropharynx is clear mucous membranes moist.  Chest is clear to auscultation there is no labored breathing air entry is somewhat shallow.  Heart is regular rate and rhythm with some irregular beats without murmur gallop or rub he does not have significant lower extremity edema heart sounds are somewhat distant  Abdomen is soft nontender with positive bowel sounds.  Musculoskeletal is able to move all extremities x4 at baseline does have stiffness of his lower extremities.  Neurologic cranial nerves appear to be intact cannot really appreciate lateralizing findings as a minimal tremor upper  extremities today.  He does speak some.  Psych findings consistent with dementia he is pleasant cooperative does follow most of the verbal commands.      Labs reviewed:  December 09, 2019.  WBC 6.3 hemoglobin 8.0 platelets 250,000.  November 25, 2019.  Sodium 140 potassium 4.9 BUN 44.3 creatinine 1.43.  Liver function tests within normal limits.  Albumin was 3.7.  TSH 2.21.  Hemoglobin A1c was 5.7 Recent Labs    04/22/19 0441 04/22/19 0441 04/23/19 0428 05/20/19 1140 05/20/19 1900 05/22/19 0110 09/01/19 1645 09/01/19 1645 09/02/19 0422 09/03/19 0523 09/29/19 0000  NA 142   < > 143   < >  --    < > 137   < > 138 137 142  K 3.0*   < > 4.0   < >  --    < > 4.2   < > 4.6 4.4 3.8  CL 104   < > 110   < >  --    < > 103   < > 107 104 104  CO2 29   < > 26   < >  --    < > 25   < > 21* 24 24*  GLUCOSE 104*   < > 102*   < >  --    < > 108*  --  101* 92  --   BUN 28*   < > 28*   < >  --    < > 39*   < > 36* 33* 39*  CREATININE 1.84*   < > 1.73*   < >  --    < > 1.61*   < > 1.54* 1.23 1.5*  CALCIUM 7.9*   < > 7.9*   < >  --    < > 9.0   < > 8.9 9.3 10.0  MG 1.7   --  1.7  --  1.8  --   --   --   --   --   --    < > = values in this interval not displayed.   Recent Labs    03/30/19 0432 04/20/19 1701 05/20/19 1140  AST 16 35 10*  ALT 5 17 5   ALKPHOS 68 80 56  BILITOT 0.5 0.5 0.3  PROT 5.5* 6.3* 5.6*  ALBUMIN 3.2* 3.3* 2.9*   Recent Labs    06/01/19 1957 06/26/19 0000 09/01/19 1645 09/01/19 1645 09/02/19 0422 09/03/19 0523 09/29/19 0000  WBC   < > 7.1 6.1   < > 7.9 7.5 7.4  NEUTROABS  --  5 4.0  --   --  4.8  --   HGB   < > 10.1* 6.8*   < > 8.1* 9.6* 9.6*  HCT   < > 30* 23.0*   < > 27.1* 31.0* 29*  MCV   < >  --  89.8  --  92.8 91.4  --   PLT   < > 136* 247   < > 209 211 192   < > = values in this interval not displayed.   Lab Results  Component Value Date   TSH 1.798 05/21/2019   Lab Results  Component Value Date   HGBA1C 4.9 06/26/2019   Lab Results  Component Value Date   CHOL 207 (H) 03/30/2019   HDL 62 03/30/2019   LDLCALC 131 (H) 03/30/2019   TRIG 70 03/30/2019   CHOLHDL 3.3 03/30/2019    Significant Diagnostic Results in last 30 days:  No results found.  Assessment/Plan  #1 history of hypertension at this point appears to be generally stable on current medications including clonidine 0.2 mg twice daily and Norvasc 2.5 mg a day I do not see consistent systolic elevations.  2.  History of anemia-unknown etiology but apparently recurrent.  Has required transfusions in the past but this has been stable recently hemoglobin was 8.0 on March 31 which is relatively baseline.  3.  History of Parkinson's disease with dementia-again emphasis is on supportive care he continues on Sinemet 25-250 mg 3 times a day he is also on Seroquel 25 mg nightly-this was reduced secondary to fall risk at this point continue supportive care.  Falls continue to be a challenge.  4.  History of prostate cancer family does not really wish aggressive work-up or treatment at this time-he appears relatively asymptomatic.  5.  History of  hyperlipidemia continues on Crestor 40 mg a day not stated as uncontrolled LDL was 42 on most recent lab.  6.  History of chronic kidney disease creatinine was 1.46 chronic lab done last month this is relatively baseline previously had been a bit higher at 1.99 but this normalized to his baseline-will update  next week  We will update a CBC BMP next week to keep an eye on anemia as well as renal function.  [id number]

## 2019-12-27 ENCOUNTER — Encounter: Payer: Self-pay | Admitting: Internal Medicine

## 2019-12-28 ENCOUNTER — Non-Acute Institutional Stay (SKILLED_NURSING_FACILITY): Payer: Medicare Other | Admitting: Internal Medicine

## 2019-12-28 DIAGNOSIS — I10 Essential (primary) hypertension: Secondary | ICD-10-CM | POA: Diagnosis not present

## 2019-12-28 DIAGNOSIS — N183 Chronic kidney disease, stage 3 unspecified: Secondary | ICD-10-CM

## 2019-12-28 DIAGNOSIS — D649 Anemia, unspecified: Secondary | ICD-10-CM | POA: Diagnosis not present

## 2019-12-28 LAB — CBC AND DIFFERENTIAL
HCT: 25 — AB (ref 41–53)
Hemoglobin: 8.2 — AB (ref 13.5–17.5)
Platelets: 154 (ref 150–399)
WBC: 6.2

## 2019-12-28 LAB — COMPREHENSIVE METABOLIC PANEL
Calcium: 8.8 (ref 8.7–10.7)
GFR calc Af Amer: 33.69
GFR calc non Af Amer: 29.07

## 2019-12-28 LAB — BASIC METABOLIC PANEL
BUN: 53 — AB (ref 4–21)
CO2: 18 (ref 13–22)
Chloride: 107 (ref 99–108)
Creatinine: 2 — AB (ref 0.6–1.3)
Glucose: 89
Potassium: 5.1 (ref 3.4–5.3)
Sodium: 139 (ref 137–147)

## 2019-12-28 LAB — CBC: RBC: 2.99 — AB (ref 3.87–5.11)

## 2019-12-28 NOTE — Progress Notes (Signed)
This is an acute visit.  Level of care skilled.  [facility]ility [facility]  Chief complaint acute visit follow-up chronic kidney disease.  History of present illness.  Patient is a pleasant 86 year old male who is a long-term resident of this facility.  He has a history of chronic kidney disease with baseline creatinine usually in the mid ones-at times it will go up at higher and on updated lab today it is up to 1.98.  Patient is a poor historian secondary to dementia but he has no complaints his vital signs appear to be stable.  Currently he is lying in bed which is his baseline he has significant frailty with a history of Parkinson's disease as well as dementia hypertension anemia hyperlipidemia and a history of prostate cancer.  Update lab also shows his hemoglobin is 8.1 which has been relatively baseline compared to recent values-he does not have evidence of bleeding family has not desired aggressive work-up of this --  Clinically he appears to be at his baseline today  Past Medical History:  Diagnosis Date  . Alzheimer's dementia ([place])   . Hypertension   . Parkinson disease ([place])   . Prostate cancer ([place])   . Renal disorder   . Syncope         Past Surgical History:  Procedure Laterality Date  . BACK SURGERY    . IR VERTEBROPLASTY CERV/THOR BX INC UNI/BIL INC/INJECT/IMAGING  01/31/2018  . IR VERTEBROPLASTY CERV/THOR BX INC UNI/BIL INC/INJECT/IMAGING  03/24/2019    No Known Allergies  Allergies as of 12/24/2019   No Known Allergies        Medication List       Accurate as of December 24, 2019  9:38 AM. If you have any questions, ask your nurse or doctor.        amLODipine 2.5 MG tablet Commonly known as: NORVASC Take 1 tablet (2.5 mg total) by mouth daily.   ARTIFICIAL TEARS OP Apply to eye. 1 drop into both eye as needed 4 times daily   ascorbic acid 500 MG tablet Commonly known as: VITAMIN C Take 500 mg by mouth daily.    aspirin 81 MG chewable tablet Chew 1 tablet (81 mg total) by mouth daily.   BABY SHAMPOO EX Apply topically. Baby shampo ( less than a dime size ) with warm water can be use for eye hygiene   carbidopa-levodopa 25-250 MG tablet Commonly known as: SINEMET IR Take 1 tablet by mouth 3 (three) times daily. 9am, 1pm, 7pm   cloNIDine 0.2 MG tablet Commonly known as: CATAPRES Take 0.2 mg by mouth 3 (three) times daily. 9am, 1pm, 7pm   D3-50 1.25 MG (50000 UT) capsule Generic drug: Cholecalciferol Take 50,000 Units by mouth every 30 (thirty) days.   feeding supplement (ENSURE ENLIVE) Liqd Take 237 mLs by mouth 2 (two) times daily between meals.   guaiFENesin 600 MG 12 hr tablet Commonly known as: MUCINEX Take 600 mg by mouth 2 (two) times daily.   multivitamin with minerals Tabs tablet Take 1 tablet by mouth daily.   NON FORMULARY Diet Order: Pureed Diet, no therapeutic restrictions   omeprazole 40 MG capsule Commonly known as: PRILOSEC Take 40 mg by mouth daily.   polyethylene glycol 17 g packet Commonly known as: MIRALAX / GLYCOLAX Take 17 g by mouth daily as needed for mild constipation.   QUEtiapine 50 MG tablet Commonly known as: SEROQUEL Take 25 mg by mouth daily at 6 PM.  rosuvastatin 40 MG tablet Commonly known as: CRESTOR Take 1 tablet (40 mg total) by mouth daily at 6 PM.   thiamine 100 MG tablet Commonly known as: Vitamin B-1 Take 100 mg by mouth daily. Take at 1 PM       Review of Systems   This is  limited secondary to dementia please see HPI--patient has no complaints today nursing has not reported any issues      Immunization History  Administered Date(s) Administered  . Fluad Quad(high Dose 65+) 05/23/2019  . Influenza, High Dose Seasonal PF 06/26/2017, 06/17/2018  . Influenza-Unspecified 06/22/2016, 06/26/2017  . Moderna SARS-COVID-2 Vaccination 10/08/2019, 11/05/2019  . Pneumococcal Polysaccharide-23 01/07/2015,  05/11/2017  . Zoster Recombinat (Shingrix) 12/11/2017, 03/18/2018       Pertinent  Health Maintenance Due  Topic Date Due  . PNA vac Low Risk Adult (2 of 2 - PCV13) 05/11/2018  . INFLUENZA VACCINE  04/10/2020   Physical exam.  Temperature is 97.5 pulse 75 respirations 18 blood pressure is 140/80.  In general this is a pleasant elderly male in no distress.  His skin is warm and dry he has numerous solar induced changes most prominently on his extremities which is not new continues of wrapping over his left elbow status post laceration sustained in a fall this is followed by nursing.  Eyes visual acuity appears to be grossly intact sclera and conjunctive are clear.  Oropharynx is clear mucous membranes moist.  Chest is clear to auscultation with shallow air entry there is no labored breathing.  Heart sounds are somewhat distant he does not have significant lower extremity edema.  Abdomen is soft nontender with positive bowel sounds.  Musculoskeletal is able to move all extremities x4 at baseline has stiffness of his lower extremities and general frailty.  Neurologic appears grossly intact his speech is clear he cannot speak a whole lot but he is pleasant and cooperative.  Psych as noted above findings consistent with dementia.  Labs.  December 28, 2018.  Sodium 139 potassium 5.1 BUN 53.1 creatinine 1.98.  WBC 10.7 hemoglobin 8.1 platelets 351.  Assessment and plan.  Chronic kidney disease-his creatinine has crept up somewhat at 1.98 with a BUN of 53.1-this has occurred before and then he returned to his baseline on subsequent labs-I have spoken with nursing about fluid supplementation and they will encourage 120 cc every 2 hours while awake---with an updated BMP on Thursday, April 22.  I also spoke with him that he needs to drink better and he seemed to agree although again with his dementia this may be a challenge.  Again will await updated labs and encourage fluids-I  suspect he would be a poor candidate for IV since he does tend to get up and move quite a bit which I suspect would make it difficult to keep an IV-but we will see what the labs tell us and monitor clinically  [id number]

## 2019-12-29 ENCOUNTER — Encounter: Payer: Self-pay | Admitting: Internal Medicine

## 2019-12-31 DIAGNOSIS — I10 Essential (primary) hypertension: Secondary | ICD-10-CM | POA: Diagnosis not present

## 2019-12-31 DIAGNOSIS — D649 Anemia, unspecified: Secondary | ICD-10-CM | POA: Diagnosis not present

## 2019-12-31 LAB — BASIC METABOLIC PANEL
BUN: 44 — AB (ref 4–21)
CO2: 18 (ref 13–22)
Chloride: 107 (ref 99–108)
Creatinine: 1.9 — AB (ref 0.6–1.3)
Glucose: 89
Potassium: 4.3 (ref 3.4–5.3)
Sodium: 140 (ref 137–147)

## 2019-12-31 LAB — COMPREHENSIVE METABOLIC PANEL
Calcium: 9.3 (ref 8.7–10.7)
GFR calc Af Amer: 36.57
GFR calc non Af Amer: 31.56

## 2019-12-31 LAB — CBC: RBC: 3.13 — AB (ref 3.87–5.11)

## 2019-12-31 LAB — CBC AND DIFFERENTIAL
HCT: 26 — AB (ref 41–53)
Hemoglobin: 8.6 — AB (ref 13.5–17.5)
Platelets: 165 (ref 150–399)
WBC: 5.9

## 2020-01-04 DIAGNOSIS — R262 Difficulty in walking, not elsewhere classified: Secondary | ICD-10-CM | POA: Diagnosis not present

## 2020-01-04 DIAGNOSIS — D509 Iron deficiency anemia, unspecified: Secondary | ICD-10-CM | POA: Diagnosis not present

## 2020-01-07 DIAGNOSIS — D649 Anemia, unspecified: Secondary | ICD-10-CM | POA: Diagnosis not present

## 2020-01-07 DIAGNOSIS — I10 Essential (primary) hypertension: Secondary | ICD-10-CM | POA: Diagnosis not present

## 2020-01-07 LAB — BASIC METABOLIC PANEL
BUN: 39 — AB (ref 4–21)
BUN: 41 — AB (ref 4–21)
CO2: 21 (ref 13–22)
CO2: 23 — AB (ref 13–22)
Chloride: 105 (ref 99–108)
Chloride: 105 (ref 99–108)
Creatinine: 1.5 — AB (ref 0.6–1.3)
Creatinine: 2 — AB (ref 0.6–1.3)
Glucose: 84
Glucose: 89
Potassium: 4.3 (ref 3.4–5.3)
Potassium: 4.7 (ref 3.4–5.3)
Sodium: 138 (ref 137–147)
Sodium: 140 (ref 137–147)

## 2020-01-07 LAB — CBC: RBC: 2.99 — AB (ref 3.87–5.11)

## 2020-01-07 LAB — COMPREHENSIVE METABOLIC PANEL
Calcium: 9.2 (ref 8.7–10.7)
Calcium: 9.7 (ref 8.7–10.7)
GFR calc Af Amer: 33.28
GFR calc Af Amer: 46.07
GFR calc non Af Amer: 28.71
GFR calc non Af Amer: 39.75

## 2020-01-07 LAB — CBC AND DIFFERENTIAL
HCT: 25 — AB (ref 41–53)
Hemoglobin: 8.3 — AB (ref 13.5–17.5)
Platelets: 176 (ref 150–399)
WBC: 7.1

## 2020-01-13 DIAGNOSIS — I10 Essential (primary) hypertension: Secondary | ICD-10-CM | POA: Diagnosis not present

## 2020-01-13 DIAGNOSIS — D649 Anemia, unspecified: Secondary | ICD-10-CM | POA: Diagnosis not present

## 2020-01-13 LAB — BASIC METABOLIC PANEL
BUN: 46 — AB (ref 4–21)
CO2: 19 (ref 13–22)
Chloride: 108 (ref 99–108)
Creatinine: 2.2 — AB (ref 0.6–1.3)
Glucose: 83
Potassium: 5.1 (ref 3.4–5.3)
Sodium: 142 (ref 137–147)

## 2020-01-13 LAB — COMPREHENSIVE METABOLIC PANEL
Calcium: 10.1 (ref 8.7–10.7)
GFR calc Af Amer: 29.82
GFR calc non Af Amer: 25.73

## 2020-01-15 DIAGNOSIS — H353211 Exudative age-related macular degeneration, right eye, with active choroidal neovascularization: Secondary | ICD-10-CM | POA: Diagnosis not present

## 2020-01-15 DIAGNOSIS — H353231 Exudative age-related macular degeneration, bilateral, with active choroidal neovascularization: Secondary | ICD-10-CM | POA: Diagnosis not present

## 2020-01-19 ENCOUNTER — Encounter: Payer: Self-pay | Admitting: Internal Medicine

## 2020-01-19 ENCOUNTER — Non-Acute Institutional Stay (SKILLED_NURSING_FACILITY): Payer: Medicare Other | Admitting: Internal Medicine

## 2020-01-19 DIAGNOSIS — N183 Chronic kidney disease, stage 3 unspecified: Secondary | ICD-10-CM | POA: Diagnosis not present

## 2020-01-19 DIAGNOSIS — F02818 Parkinson's disease without dyskinesia, without mention of fluctuations: Secondary | ICD-10-CM

## 2020-01-19 DIAGNOSIS — Z66 Do not resuscitate: Secondary | ICD-10-CM

## 2020-01-19 DIAGNOSIS — I10 Essential (primary) hypertension: Secondary | ICD-10-CM

## 2020-01-19 DIAGNOSIS — R1312 Dysphagia, oropharyngeal phase: Secondary | ICD-10-CM | POA: Diagnosis not present

## 2020-01-19 DIAGNOSIS — G20 Parkinson's disease: Secondary | ICD-10-CM

## 2020-01-19 DIAGNOSIS — E785 Hyperlipidemia, unspecified: Secondary | ICD-10-CM

## 2020-01-19 DIAGNOSIS — I69828 Other speech and language deficits following other cerebrovascular disease: Secondary | ICD-10-CM | POA: Diagnosis not present

## 2020-01-19 DIAGNOSIS — F0281 Dementia in other diseases classified elsewhere with behavioral disturbance: Secondary | ICD-10-CM

## 2020-01-19 DIAGNOSIS — G20A1 Parkinson's disease without dyskinesia, without mention of fluctuations: Secondary | ICD-10-CM

## 2020-01-19 DIAGNOSIS — D509 Iron deficiency anemia, unspecified: Secondary | ICD-10-CM | POA: Diagnosis not present

## 2020-01-19 DIAGNOSIS — R1310 Dysphagia, unspecified: Secondary | ICD-10-CM | POA: Diagnosis not present

## 2020-01-19 NOTE — Progress Notes (Signed)
Patient ID: Jose Baldwin, male   DOB: July 08, 1929, 86 y.o.   MRN: [id number]  Location:  [facility] [place] Room Number: 215 D Place of Service:  SNF (31) Provider:  Veyda Kaufman L. Mariea Baldwin, D.O., C.M.D.  Jose Ruddy, MD  Patient Care Team: Jose Ruddy, MD as PCP - General [facility] Medicine)  Extended Emergency Contact Information Primary Emergency Contact: Jose Baldwin Address: [place], [place] [address] Jose Baldwin of [place] Phone: [phone] Mobile Phone: [phone] Relation: Son  Code Status:  DNR Goals of care: Advanced Directive information Advanced Directives 01/19/2020  Does Patient Have a Medical Advance Directive? Yes  Type of Advance Directive Out of facility DNR (pink MOST or yellow form)  Does patient want to make changes to medical advance directive? No - Patient declined  Copy of [place] in Chart? Yes - validated most recent copy scanned in chart (See row information)  Would patient like information on creating a medical advance directive? -  Pre-existing out of facility DNR order (yellow form or pink MOST form) -     Chief Complaint  Patient presents with  . Medical Management of Chronic Issues    routine Visit     HPI:  Pt is a 86 y.o. male seen today for medical management of chronic diseases.  Jose Baldwin has a h/o Parkinson's disease with dementia, dysphagia, anemia, CKD3 and has been here since Sept of '20.  He fell 3 days ago.  He's incontinent, needs ADL assistance.   He's on seroquel at hs for hallucinations related to his Parkinson's dementia.   He's on a pureed diet for his dysphagia.   He has macular degeneration and was getting eyelea injections (last was 09/15/19, it appears).  When seen, he was resting in bed for a nap, opened his eyes, did not speak.  He is quite frail.  Weight 134 which is an improvement since early this year.  His left side had increased rigidity.     He remains on a statin at 33 with dementia and functional dependence, frailty.    Past Medical History:  Diagnosis Date  . Alzheimer's dementia ([place])   . Hypertension   . Parkinson disease ([place])   . Prostate cancer ([place])   . Renal disorder   . Syncope    Past Surgical History:  Procedure Laterality Date  . BACK SURGERY    . IR VERTEBROPLASTY CERV/THOR BX INC UNI/BIL INC/INJECT/IMAGING  01/31/2018  . IR VERTEBROPLASTY CERV/THOR BX INC UNI/BIL INC/INJECT/IMAGING  03/24/2019    No Known Allergies  Outpatient Encounter Medications as of 01/19/2020  Medication Sig  . amLODipine (NORVASC) 2.5 MG tablet Take 1 tablet (2.5 mg total) by mouth daily.  Marland Kitchen ascorbic acid (VITAMIN C) 500 MG tablet Take 500 mg by mouth daily.  Marland Kitchen aspirin 81 MG chewable tablet Chew 1 tablet (81 mg total) by mouth daily.  . carbidopa-levodopa (SINEMET IR) 25-250 MG tablet Take 1 tablet by mouth 3 (three) times daily. 9am, 1pm, 7pm  . Carboxymethylcellulose Sodium (ARTIFICIAL TEARS OP) Apply to eye. 1 drop into both eye as needed 4 times daily  . Cholecalciferol (D3-50) 1.25 MG (50000 UT) capsule Take 50,000 Units by mouth every 30 (thirty) days.  . cloNIDine (CATAPRES) 0.2 MG tablet Take 0.2 mg by mouth 3 (three) times daily. 9am, 1pm, 7pm  . feeding supplement, ENSURE ENLIVE, (ENSURE ENLIVE) LIQD  Take 237 mLs by mouth 2 (two) times daily between meals.  Marland Kitchen guaiFENesin (MUCINEX) 600 MG 12 hr tablet Take 600 mg by mouth 2 (two) times daily.  . Infant Care Products (BABY SHAMPOO EX) Apply topically. Baby shampo ( less than a dime size ) with warm water can be use for eye hygiene  . Multiple Vitamin (MULTIVITAMIN WITH MINERALS) TABS tablet Take 1 tablet by mouth daily.  . NON FORMULARY Diet Order: Pureed Diet, no therapeutic restrictions  . omeprazole (PRILOSEC) 40 MG capsule Take 40 mg by mouth daily.  . polyethylene glycol (MIRALAX / GLYCOLAX) 17 g packet Take 17 g by mouth daily as needed for mild constipation.  .  QUEtiapine (SEROQUEL) 50 MG tablet Take 25 mg by mouth daily at 6 PM.   . rosuvastatin (CRESTOR) 40 MG tablet Take 1 tablet (40 mg total) by mouth daily at 6 PM.  . thiamine (VITAMIN B-1) 100 MG tablet Take 100 mg by mouth daily. Take at 1 PM   No facility-administered encounter medications on file as of 01/19/2020.    Review of Systems  Constitutional: Negative for chills and fever.  Eyes: Positive for blurred vision.  Respiratory: Negative for shortness of breath.   Cardiovascular: Negative for chest pain, palpitations and leg swelling.  Gastrointestinal: Positive for constipation. Negative for abdominal pain.  Genitourinary: Negative for dysuria.  Musculoskeletal: Positive for falls.       Rigidity and tremor from PD  Neurological: Positive for tremors and weakness. Negative for dizziness and loss of consciousness.  Psychiatric/Behavioral: Positive for hallucinations and memory loss. Negative for depression.    Immunization History  Administered Date(s) Administered  . Fluad Quad(high Dose 65+) 05/23/2019  . Influenza, High Dose Seasonal PF 06/26/2017, 06/17/2018  . Influenza-Unspecified 06/22/2016, 06/26/2017  . Moderna SARS-COVID-2 Vaccination 10/08/2019, 11/05/2019  . Pneumococcal Polysaccharide-23 01/07/2015, 05/11/2017  . Zoster Recombinat (Shingrix) 12/11/2017, 03/18/2018   Pertinent  Health Maintenance Due  Topic Date Due  . PNA vac Low Risk Adult (2 of 2 - PCV13) 05/11/2018  . INFLUENZA VACCINE  04/10/2020   No flowsheet data found. Functional Status Survey:    Vitals:   01/19/20 1602  BP: 108/67  Pulse: 68  Temp: (!) 97.1 F (36.2 C)  Weight: 132 lb (59.9 kg)  Height: 6\' 1"  (1.854 m)   Body mass index is 17.42 kg/m. Physical Exam Vitals and nursing note reviewed.  Constitutional:      General: He is not in acute distress.    Appearance: He is not toxic-appearing.     Comments: Frail male resting in bed, did not speak  HENT:     Head: Normocephalic  and atraumatic.  Cardiovascular:     Rate and Rhythm: Normal rate and regular rhythm.  Pulmonary:     Effort: Pulmonary effort is normal.     Breath sounds: Normal breath sounds. No wheezing, rhonchi or rales.  Abdominal:     General: Bowel sounds are normal.     Palpations: Abdomen is soft.  Musculoskeletal:     Right lower leg: No edema.     Left lower leg: No edema.  Skin:    General: Skin is warm and dry.     Coloration: Skin is pale.  Neurological:     Comments: Cogwheel rigidity of left greater than right side; resting tremor, masked facies; did not speak so could not assess orientation today  Psychiatric:     Comments: Could not assess without speech and chronic masked facies  Labs reviewed: Recent Labs    04/22/19 0441 04/22/19 0441 04/23/19 0428 05/20/19 1140 05/20/19 1900 05/22/19 0110 09/01/19 1645 09/01/19 1645 09/02/19 0422 09/03/19 0523 09/29/19 0000  NA 142   < > 143   < >  --    < > 137   < > 138 137 142  K 3.0*   < > 4.0   < >  --    < > 4.2   < > 4.6 4.4 3.8  CL 104   < > 110   < >  --    < > 103   < > 107 104 104  CO2 29   < > 26   < >  --    < > 25   < > 21* 24 24*  GLUCOSE 104*   < > 102*   < >  --    < > 108*  --  101* 92  --   BUN 28*   < > 28*   < >  --    < > 39*   < > 36* 33* 39*  CREATININE 1.84*   < > 1.73*   < >  --    < > 1.61*   < > 1.54* 1.23 1.5*  CALCIUM 7.9*   < > 7.9*   < >  --    < > 9.0   < > 8.9 9.3 10.0  MG 1.7  --  1.7  --  1.8  --   --   --   --   --   --    < > = values in this interval not displayed.   Recent Labs    03/30/19 0432 04/20/19 1701 05/20/19 1140  AST 16 35 10*  ALT 5 17 5   ALKPHOS 68 80 56  BILITOT 0.5 0.5 0.3  PROT 5.5* 6.3* 5.6*  ALBUMIN 3.2* 3.3* 2.9*   Recent Labs    06/01/19 1957 06/26/19 0000 09/01/19 1645 09/01/19 1645 09/02/19 0422 09/03/19 0523 09/29/19 0000  WBC   < > 7.1 6.1   < > 7.9 7.5 7.4  NEUTROABS  --  5 4.0  --   --  4.8  --   HGB   < > 10.1* 6.8*   < > 8.1* 9.6* 9.6*    HCT   < > 30* 23.0*   < > 27.1* 31.0* 29*  MCV   < >  --  89.8  --  92.8 91.4  --   PLT   < > 136* 247   < > 209 211 192   < > = values in this interval not displayed.   Lab Results  Component Value Date   TSH 1.798 05/21/2019   Lab Results  Component Value Date   HGBA1C 4.9 06/26/2019   Lab Results  Component Value Date   CHOL 207 (H) 03/30/2019   HDL 62 03/30/2019   LDLCALC 131 (H) 03/30/2019   TRIG 70 03/30/2019   CHOLHDL 3.3 03/30/2019    Assessment/Plan 1. Parkinson disease ([place]) -appears to be at advanced stage and complicated by dementia -continues on sinemet 25/250mg  po tid, resting tremor and cogwheel rigidity--requires assistance with his full adls  2. Dementia due to Parkinson's disease with behavioral disturbance ([place]) -appears this may be quite advanced, as well -has had hallucinations/psychosis--on seroquel due to affecting his quality of life  3. Stage 3 chronic kidney disease, unspecified whether stage 3a or 3b CKD -Avoid nephrotoxic agents  like nsaids, dose adjust renally excreted meds, encourage hydration  4. Essential hypertension -bp at goal, has had h/o orthostatic hypotension related to his PD (neurogenic), cont same regimen and monitor  5. Oropharyngeal dysphagia -cont pureed diet and aspiration precautions, ensure enlive supplement -wt stable lately  6. DNR (do not resuscitate) - Do not attempt resuscitation (DNR) order already in place, has been followed by palliative care historically  7.  Hyperlipidemia -remains on statin despite comfort-based goals, advanced PD and dementia, functional dependence and advanced age--opted to d/c crestor today due to time to benefit and comfort-based goals -also last LDL was incredibly low  Family/ staff Communication: discussed with ADON  Labs/tests ordered:  No new--avoid aggressive lab testing in favor of comfort  Cruz Devilla L. Tonae Livolsi, D.O. [facility] [address], [place] [address] Cell Phone (Mon-Fri 8am-5pm):  [phone] On Call:  [phone] & follow prompts after 5pm & weekends Office Phone:  [phone] Office Fax:  [phone]

## 2020-01-20 DIAGNOSIS — I69828 Other speech and language deficits following other cerebrovascular disease: Secondary | ICD-10-CM | POA: Diagnosis not present

## 2020-01-20 DIAGNOSIS — R1310 Dysphagia, unspecified: Secondary | ICD-10-CM | POA: Diagnosis not present

## 2020-01-20 DIAGNOSIS — D509 Iron deficiency anemia, unspecified: Secondary | ICD-10-CM | POA: Diagnosis not present

## 2020-01-21 ENCOUNTER — Non-Acute Institutional Stay (SKILLED_NURSING_FACILITY): Payer: Medicare Other | Admitting: Internal Medicine

## 2020-01-21 DIAGNOSIS — I69828 Other speech and language deficits following other cerebrovascular disease: Secondary | ICD-10-CM | POA: Diagnosis not present

## 2020-01-21 DIAGNOSIS — I10 Essential (primary) hypertension: Secondary | ICD-10-CM | POA: Diagnosis not present

## 2020-01-21 DIAGNOSIS — N183 Chronic kidney disease, stage 3 unspecified: Secondary | ICD-10-CM

## 2020-01-21 DIAGNOSIS — N179 Acute kidney failure, unspecified: Secondary | ICD-10-CM | POA: Diagnosis not present

## 2020-01-21 DIAGNOSIS — D509 Iron deficiency anemia, unspecified: Secondary | ICD-10-CM | POA: Diagnosis not present

## 2020-01-21 DIAGNOSIS — D649 Anemia, unspecified: Secondary | ICD-10-CM | POA: Diagnosis not present

## 2020-01-21 DIAGNOSIS — R1310 Dysphagia, unspecified: Secondary | ICD-10-CM | POA: Diagnosis not present

## 2020-01-21 LAB — BASIC METABOLIC PANEL
BUN: 56 — AB (ref 4–21)
CO2: 20 (ref 13–22)
Chloride: 105 (ref 99–108)
Creatinine: 2.5 — AB (ref 0.6–1.3)
Glucose: 83
Potassium: 5.3 (ref 3.4–5.3)
Sodium: 138 (ref 137–147)

## 2020-01-21 LAB — COMPREHENSIVE METABOLIC PANEL
Calcium: 9.7 (ref 8.7–10.7)
GFR calc Af Amer: 26.03
GFR calc non Af Amer: 22.46

## 2020-01-21 NOTE — Progress Notes (Signed)
This is an acute visit.  Level of care skilled.  Facility is Doctor, hospital complaint acute visit secondary to elevated creatinine.  History of present illness.  Patient is a pleasant elderly male with been a long-term resident of the facility He has a history of dementia as well as Parkinson's disease-hypertension anemia hyperlipidemia-a history of prostate cancer as well as chronic kidney disease.  It appears at one point his creatinine was more in the mid ones area but on recent labs has shown some variation but hovering more in the 2 area  He is not on any diuretic and staff has been encouraging fluids.  Electrolytes appear to have been stable.  However lab today shows his creatinine has jumped up a bit to 2.45 and BUN is now 56.1.    Clinically appears to be at baseline he is chronically weak appearing but does not appear to be precipitously changing.  His potassium is minimally elevated at 5.3 as well on today's lab.  Currently he is lying in bed comfortably he is pleasant alert does have confusion but is pleasantly so.  Vital signs continue to be stable    Past Medical History:  Diagnosis Date  . Alzheimer's dementia ([place])   . Hypertension   . Parkinson disease ([place])   . Prostate cancer ([place])   . Renal disorder   . Syncope         Past Surgical History:  Procedure Laterality Date  . BACK SURGERY    . IR VERTEBROPLASTY CERV/THOR BX INC UNI/BIL INC/INJECT/IMAGING  01/31/2018  . IR VERTEBROPLASTY CERV/THOR BX INC UNI/BIL INC/INJECT/IMAGING  03/24/2019    No Known Allergies         Medication Sig  . amLODipine (NORVASC) 2.5 MG tablet Take 1 tablet (2.5 mg total) by mouth daily.  Marland Kitchen ascorbic acid (VITAMIN C) 500 MG tablet Take 500 mg by mouth daily.  Marland Kitchen aspirin 81 MG chewable tablet Chew 1 tablet (81 mg total) by mouth daily.  . carbidopa-levodopa (SINEMET IR) 25-250 MG tablet Take 1 tablet by mouth 3 (three) times daily. 9am, 1pm, 7pm  .  Carboxymethylcellulose Sodium (ARTIFICIAL TEARS OP) Apply to eye. 1 drop into both eye as needed 4 times daily  . Cholecalciferol (D3-50) 1.25 MG (50000 UT) capsule Take 50,000 Units by mouth every 30 (thirty) days.  . cloNIDine (CATAPRES) 0.2 MG tablet Take 0.2 mg by mouth 3 (three) times daily. 9am, 1pm, 7pm  . feeding supplement, ENSURE ENLIVE, (ENSURE ENLIVE) LIQD Take 237 mLs by mouth 2 (two) times daily between meals.  Marland Kitchen guaiFENesin (MUCINEX) 600 MG 12 hr tablet Take 600 mg by mouth 2 (two) times daily.  . Infant Care Products (BABY SHAMPOO EX) Apply topically. Baby shampo ( less than a dime size ) with warm water can be use for eye hygiene  . Multiple Vitamin (MULTIVITAMIN WITH MINERALS) TABS tablet Take 1 tablet by mouth daily.  . NON FORMULARY Diet Order: Pureed Diet, no therapeutic restrictions  . omeprazole (PRILOSEC) 40 MG capsule Take 40 mg by mouth daily.  . polyethylene glycol (MIRALAX / GLYCOLAX) 17 g packet Take 17 g by mouth daily as needed for mild constipation.  . QUEtiapine (SEROQUEL) 50 MG tablet Take 25 mg by mouth daily at 6 PM.   . rosuvastatin (CRESTOR) 40 MG tablet Take 1 tablet (40 mg total) by mouth daily at 6 PM.  . thiamine (VITAMIN B-1) 100 MG tablet Take 100 mg by mouth daily. Take at 1  PM   No facility-administered encounter medications on file as of 01/19/2020.    ROS--is quite limited secondary to dementia-nursing does not report any recent acute issues.  Have been no complaints of increased pain or shortness of breath.  He indicates he is doing all right  Immunization History  Administered Date(s) Administered  . Fluad Quad(high Dose 65+) 05/23/2019  . Influenza, High Dose Seasonal PF 06/26/2017, 06/17/2018  . Influenza-Unspecified 06/22/2016, 06/26/2017  . Moderna SARS-COVID-2 Vaccination 10/08/2019, 11/05/2019  . Pneumococcal Polysaccharide-23 01/07/2015, 05/11/2017  . Zoster Recombinat (Shingrix) 12/11/2017, 03/18/2018       Pertinent   Health Maintenance Due  Topic Date Due  . PNA vac Low Risk Adult (2 of 2 - PCV13) 05/11/2018  . INFLUENZA VACCINE  04/10/2020    Physical exam.  Temperature is 97.5 pulse 74 respirations 18 blood pressure 106/70.  In general this is a pleasant frail-appearing elderly male in no distress he is lying comfortably in bed he is alert.  His skin is warm and dry with baseline solar induced changes.  Eyes visual acuity appears to be intact sclera and conjunctival are clear.  Oropharynx is clear mucous membranes appear fairly moist.  Chest is clear to auscultation with somewhat poor respiratory effort there is no labored breathing.  Heart he continues to have distant heart sounds from what I could auscultate and per radial pulse was largely regular rate and rhythm with an occasional irregular beat --he does not have significant lower extremity edema.  Abdomen is soft nontender with positive bowel sounds.  Musculoskeletal does move all extremities x4 it appears at relative baseline-- he does sit up in bed and sits on the side of the bed-he continues to be somewhat of a fall risk.    Neurologic appears grossly intact his speech is clear but he does not speak a whole lot could not really notice a tremor today but at times he will have an upper extremity tremor.    Psych findings consistent with dementia he is pleasant cooperative does not speak much but tends to follow most verbal commands with some prompting.  Labs.  Jan 21, 2020.  Sodium 138 potassium 5.3 BUN 56.1 creatinine 2.45.  Jan 13, 2020.  Sodium 142 potassium 5.1 BUN 45.7 creatinine 2.19.  January 04, 2020.  WBC 5.9 hemoglobin 8.6 platelets 165.  Recent Labs    04/22/19 0441 04/22/19 0441 04/23/19 0428 05/20/19 1140 05/20/19 1900 05/22/19 0110 09/01/19 1645 09/01/19 1645 09/02/19 0422 09/03/19 0523 09/29/19 0000  NA 142   < > 143   < >  --    < > 137   < > 138 137 142  K 3.0*   < > 4.0   < >  --    < > 4.2   <  > 4.6 4.4 3.8  CL 104   < > 110   < >  --    < > 103   < > 107 104 104  CO2 29   < > 26   < >  --    < > 25   < > 21* 24 24*  GLUCOSE 104*   < > 102*   < >  --    < > 108*  --  101* 92  --   BUN 28*   < > 28*   < >  --    < > 39*   < > 36* 33* 39*  CREATININE 1.84*   < > 1.73*   < >  --    < >  1.61*   < > 1.54* 1.23 1.5*  CALCIUM 7.9*   < > 7.9*   < >  --    < > 9.0   < > 8.9 9.3 10.0  MG 1.7  --  1.7  --  1.8  --   --   --   --   --   --    < > = values in this interval not displayed.     Recent Labs (within last 365 days)       Recent Labs    03/30/19 0432 04/20/19 1701 05/20/19 1140  AST 16 35 10*  ALT 5 17 5   ALKPHOS 68 80 56  BILITOT 0.5 0.5 0.3  PROT 5.5* 6.3* 5.6*  ALBUMIN 3.2* 3.3* 2.9*     Recent Labs (within last 365 days)           Recent Labs    06/01/19 1957 06/26/19 0000 09/01/19 1645 09/01/19 1645 09/02/19 0422 09/03/19 0523 09/29/19 0000  WBC   < > 7.1 6.1   < > 7.9 7.5 7.4  NEUTROABS  --  5 4.0  --   --  4.8  --   HGB   < > 10.1* 6.8*   < > 8.1* 9.6* 9.6*  HCT   < > 30* 23.0*   < > 27.1* 31.0* 29*  MCV   < >  --  89.8  --  92.8 91.4  --   PLT   < > 136* 247   < > 209 211 192   < > = values in this interval not displayed.     Recent Labs   Assessment and plan.  1.  Chronic kidney disease thought to be stage III-creatinine has risen-I suspect some of this is more chronic progression of his renal disease-but creatinine has jumped up a bit from his recent baseline between the mid ones and 2 area-we will give him a short course of IV fluids-there has been some hesitancy before that about him keeping an IV in but speaking with nursing they feel this may work.  We will give normal saline at 70 cc an hour for 2 L and recheck a metabolic panel on Monday, May 17.  Continue to monitor --clinically he appears to be at his baseline.  [id number] note greater than 25 minutes spent assessing patient-reviewing his chart and labs-discussing his status with  nursing staff-and formulating a plan of care-of note greater than 50% of time spent formulating plan of care with input as noted above

## 2020-01-22 ENCOUNTER — Encounter: Payer: Self-pay | Admitting: Internal Medicine

## 2020-01-22 DIAGNOSIS — R1310 Dysphagia, unspecified: Secondary | ICD-10-CM | POA: Diagnosis not present

## 2020-01-22 DIAGNOSIS — D509 Iron deficiency anemia, unspecified: Secondary | ICD-10-CM | POA: Diagnosis not present

## 2020-01-22 DIAGNOSIS — I69828 Other speech and language deficits following other cerebrovascular disease: Secondary | ICD-10-CM | POA: Diagnosis not present

## 2020-01-25 ENCOUNTER — Non-Acute Institutional Stay (SKILLED_NURSING_FACILITY): Payer: Medicare Other | Admitting: Internal Medicine

## 2020-01-25 ENCOUNTER — Encounter: Payer: Self-pay | Admitting: Internal Medicine

## 2020-01-25 DIAGNOSIS — I129 Hypertensive chronic kidney disease with stage 1 through stage 4 chronic kidney disease, or unspecified chronic kidney disease: Secondary | ICD-10-CM | POA: Diagnosis not present

## 2020-01-25 DIAGNOSIS — N183 Chronic kidney disease, stage 3 unspecified: Secondary | ICD-10-CM | POA: Diagnosis not present

## 2020-01-25 DIAGNOSIS — D649 Anemia, unspecified: Secondary | ICD-10-CM | POA: Diagnosis not present

## 2020-01-25 DIAGNOSIS — I10 Essential (primary) hypertension: Secondary | ICD-10-CM | POA: Diagnosis not present

## 2020-01-25 DIAGNOSIS — I69828 Other speech and language deficits following other cerebrovascular disease: Secondary | ICD-10-CM | POA: Diagnosis not present

## 2020-01-25 DIAGNOSIS — N179 Acute kidney failure, unspecified: Secondary | ICD-10-CM | POA: Diagnosis not present

## 2020-01-25 DIAGNOSIS — D509 Iron deficiency anemia, unspecified: Secondary | ICD-10-CM | POA: Diagnosis not present

## 2020-01-25 DIAGNOSIS — R1310 Dysphagia, unspecified: Secondary | ICD-10-CM | POA: Diagnosis not present

## 2020-01-25 LAB — BASIC METABOLIC PANEL
BUN: 45 — AB (ref 4–21)
CO2: 19 (ref 13–22)
Chloride: 106 (ref 99–108)
Creatinine: 1.9 — AB (ref 0.6–1.3)
Glucose: 83
Potassium: 5.2 (ref 3.4–5.3)
Sodium: 137 (ref 137–147)

## 2020-01-25 LAB — COMPREHENSIVE METABOLIC PANEL
Calcium: 9.7 (ref 8.7–10.7)
GFR calc Af Amer: 34.73
GFR calc non Af Amer: 29.97

## 2020-01-25 NOTE — Progress Notes (Signed)
Location:    [facility] Room Number: 215/D Place of Service:  SNF [phone]) Provider:  Darrin Nipper, MD  Patient Care Team: Billie Ruddy, MD as PCP - General (Family Medicine)  Extended Emergency Contact Information Primary Emergency Contact: Orpah Melter Address: [place], [place] [address] [name] of [place] Phone: [phone] Mobile Phone: [phone] Relation: Son  Code Status:  DNR Goals of care: Advanced Directive information Advanced Directives 01/25/2020  Does Patient Have a Medical Advance Directive? Yes  Type of Paramedic of [place];Out of facility DNR (pink MOST or yellow form)  Does patient want to make changes to medical advance directive? No - Patient declined  Copy of [place] in Chart? Yes - validated most recent copy scanned in chart (See row information)  Would patient like information on creating a medical advance directive? -  Pre-existing out of facility DNR order (yellow form or pink MOST form) Yellow form placed in chart (order not valid for inpatient use)     Chief Complaint  Patient presents with  . Follow-up    Renal Insufficiency    HPI:  Pt is a 86 y.o. male seen today for an acute visit for follow-up of renal insufficiency.  He is a long-term resident of facility has a history of dementia with Parkinson's disease as well as hypertension-hyperlipidemia history of prostate cancer and chronic kidney disease.  At 1 point his creatinine appeared to be fairly stable in the mid ones but on recent labs have shown some variation going up into the 2 area in fact it was 2.45 last week.  We did give him some IV fluids over the weekend-an update lab today shows it has come down more to his most recent baseline at 1.93 with a BUN of 44.5  We have encouraged him to drink his fluids-clinically appears to be at baseline-vital signs  appear to be stable.  He is not on any diuretic or ACE inhibitor   Past Medical History:  Diagnosis Date  . Alzheimer's dementia ([place])   . Hypertension   . Parkinson disease ([place])   . Prostate cancer ([place])   . Renal disorder   . Syncope    Past Surgical History:  Procedure Laterality Date  . BACK SURGERY    . IR VERTEBROPLASTY CERV/THOR BX INC UNI/BIL INC/INJECT/IMAGING  01/31/2018  . IR VERTEBROPLASTY CERV/THOR BX INC UNI/BIL INC/INJECT/IMAGING  03/24/2019    No Known Allergies  Outpatient Encounter Medications as of 01/25/2020  Medication Sig  . amLODipine (NORVASC) 2.5 MG tablet Take 1 tablet (2.5 mg total) by mouth daily.  Marland Kitchen ascorbic acid (VITAMIN C) 500 MG tablet Take 500 mg by mouth daily.  Marland Kitchen aspirin 81 MG chewable tablet Chew 1 tablet (81 mg total) by mouth daily.  . carbidopa-levodopa (SINEMET IR) 25-250 MG tablet Take 1 tablet by mouth 3 (three) times daily. 9am, 1pm, 7pm  . Carboxymethylcellulose Sodium (ARTIFICIAL TEARS OP) Apply to eye. 1 drop into both eye as needed 4 times daily  . Cholecalciferol (D3-50) 1.25 MG (50000 UT) capsule Take 50,000 Units by mouth every 30 (thirty) days.  . cloNIDine (CATAPRES) 0.2 MG tablet Take 0.2 mg by mouth 3 (three) times daily. 9am, 1pm, 7pm  . feeding supplement, ENSURE ENLIVE, (ENSURE ENLIVE) LIQD Take 237 mLs by mouth 2 (two) times daily between meals.  Marland Kitchen  guaiFENesin (MUCINEX) 600 MG 12 hr tablet Take 600 mg by mouth 2 (two) times daily.  . Infant Care Products (BABY SHAMPOO EX) Apply topically. Baby shampo ( less than a dime size ) with warm water can be use for eye hygiene  . Multiple Vitamin (MULTIVITAMIN WITH MINERALS) TABS tablet Take 1 tablet by mouth daily.  . NON FORMULARY Diet Order: Pureed Diet, no therapeutic restrictions  . omeprazole (PRILOSEC) 40 MG capsule Take 40 mg by mouth daily.  . polyethylene glycol (MIRALAX / GLYCOLAX) 17 g packet Take 17 g by mouth daily as needed for mild constipation.  . QUEtiapine  (SEROQUEL) 50 MG tablet Take 25 mg by mouth daily at 6 PM.   . thiamine (VITAMIN B-1) 100 MG tablet Take 100 mg by mouth daily. Take at 1 PM   No facility-administered encounter medications on file as of 01/25/2020.    Review of Systems Is largely unobtainable secondary to dementia please see HPI  Immunization History  Administered Date(s) Administered  . Fluad Quad(high Dose 65+) 05/23/2019  . Influenza, High Dose Seasonal PF 06/26/2017, 06/17/2018  . Influenza-Unspecified 06/22/2016, 06/26/2017  . Moderna SARS-COVID-2 Vaccination 10/08/2019, 11/05/2019  . Pneumococcal Polysaccharide-23 01/07/2015, 05/11/2017  . Zoster Recombinat (Shingrix) 12/11/2017, 03/18/2018   Pertinent  Health Maintenance Due  Topic Date Due  . PNA vac Low Risk Adult (2 of 2 - PCV13) 05/11/2018  . INFLUENZA VACCINE  04/10/2020   No flowsheet data found. Functional Status Survey:    Vitals:   01/25/20 1635  BP: 121/67  Pulse: 72  Resp: 18  Temp: 97.9 F (36.6 C)  TempSrc: Oral  SpO2: 97%  Weight: 134 lb 3.2 oz (60.9 kg)  Height: 6\' 1"  (1.854 m)   Body mass index is 17.71 kg/m. Physical Exam In general this is a pleasant elderly male in no distress lying comfortably in bed.  His skin is warm and dry he does have numerous solar induced changes.  Eyes visual acuity appears to be intact sclera and conjunctive are clear.  Oropharynx clear mucous membranes appear  Moist.  Chest is clear to auscultation with somewhat poor respiratory effort there is no labored breathing or overt congestion.  Heart is distant heart sounds from what I could auscultate and per radial pulse was regular rate and rhythm with occasional irregular beats.  He does not have significant edema.  Abdomen soft nontender with positive bowel sounds.  Musculoskeletal has general frailty but is able to move all extremities x4 it appears at relative baseline.  Neurologic appears grossly intact his speech is clear-he does not  speak a whole lot but he is pleasant and cooperative-I did not really note a tremor today at times he will have an upper extremity tremor.  Psych he is oriented to self he is pleasant and cooperative at times needs some additional prompting with verbal commands   Labs reviewed:  Jan 25, 2020.  Sodium 135 potassium 5.2 BUN 44.5 creatinine 1.93.  Jan 21, 2020.  Sodium 138 potassium 5.3 BUN 56.1 creatinine 2.45.  Jan 13, 2020.  Sodium 142 potassium 5.1 BUN 45.7 creatinine 2.19.  January 04, 2020.  WBC 5.9 hemoglobin 8.6 platelets 165.    Recent Labs    04/22/19 0441 04/22/19 0441 04/23/19 0428 05/20/19 1140 05/20/19 1900 05/22/19 0110 09/01/19 1645 09/01/19 1645 09/02/19 0422 09/03/19 0523 09/29/19 0000  NA 142   < > 143   < >  --    < > 137   < > 138  137 142  K 3.0*   < > 4.0   < >  --    < > 4.2   < > 4.6 4.4 3.8  CL 104   < > 110   < >  --    < > 103   < > 107 104 104  CO2 29   < > 26   < >  --    < > 25   < > 21* 24 24*  GLUCOSE 104*   < > 102*   < >  --    < > 108*  --  101* 92  --   BUN 28*   < > 28*   < >  --    < > 39*   < > 36* 33* 39*  CREATININE 1.84*   < > 1.73*   < >  --    < > 1.61*   < > 1.54* 1.23 1.5*  CALCIUM 7.9*   < > 7.9*   < >  --    < > 9.0   < > 8.9 9.3 10.0  MG 1.7  --  1.7  --  1.8  --   --   --   --   --   --    < > = values in this interval not displayed.   Recent Labs    03/30/19 0432 04/20/19 1701 05/20/19 1140  AST 16 35 10*  ALT 5 17 5   ALKPHOS 68 80 56  BILITOT 0.5 0.5 0.3  PROT 5.5* 6.3* 5.6*  ALBUMIN 3.2* 3.3* 2.9*   Recent Labs    06/01/19 1957 06/26/19 0000 09/01/19 1645 09/01/19 1645 09/02/19 0422 09/03/19 0523 09/29/19 0000  WBC   < > 7.1 6.1   < > 7.9 7.5 7.4  NEUTROABS  --  5 4.0  --   --  4.8  --   HGB   < > 10.1* 6.8*   < > 8.1* 9.6* 9.6*  HCT   < > 30* 23.0*   < > 27.1* 31.0* 29*  MCV   < >  --  89.8  --  92.8 91.4  --   PLT   < > 136* 247   < > 209 211 192   < > = values in this interval not  displayed.   Lab Results  Component Value Date   TSH 1.798 05/21/2019   Lab Results  Component Value Date   HGBA1C 4.9 06/26/2019   Lab Results  Component Value Date   CHOL 207 (H) 03/30/2019   HDL 62 03/30/2019   LDLCALC 131 (H) 03/30/2019   TRIG 70 03/30/2019   CHOLHDL 3.3 03/30/2019    Significant Diagnostic Results in last 30 days:  No results found.  Assessment/Plan   #1 history of chronic kidney disease-suspect recent rising creatinine may be some gradual progression of his kidney disease.  With IV fluids his creatinine has come down relatively to his recent baseline in the 1.9-2 area-will monitor for now and update a metabolic panel later in the week.  Potassium continues to be somewhat borderline at 5.2   [id number]

## 2020-01-26 ENCOUNTER — Encounter: Payer: Self-pay | Admitting: Internal Medicine

## 2020-01-26 DIAGNOSIS — D509 Iron deficiency anemia, unspecified: Secondary | ICD-10-CM | POA: Diagnosis not present

## 2020-01-26 DIAGNOSIS — R1310 Dysphagia, unspecified: Secondary | ICD-10-CM | POA: Diagnosis not present

## 2020-01-26 DIAGNOSIS — I69828 Other speech and language deficits following other cerebrovascular disease: Secondary | ICD-10-CM | POA: Diagnosis not present

## 2020-01-27 DIAGNOSIS — D509 Iron deficiency anemia, unspecified: Secondary | ICD-10-CM | POA: Diagnosis not present

## 2020-01-27 DIAGNOSIS — I69828 Other speech and language deficits following other cerebrovascular disease: Secondary | ICD-10-CM | POA: Diagnosis not present

## 2020-01-27 DIAGNOSIS — R1310 Dysphagia, unspecified: Secondary | ICD-10-CM | POA: Diagnosis not present

## 2020-01-28 DIAGNOSIS — I69828 Other speech and language deficits following other cerebrovascular disease: Secondary | ICD-10-CM | POA: Diagnosis not present

## 2020-01-28 DIAGNOSIS — R1310 Dysphagia, unspecified: Secondary | ICD-10-CM | POA: Diagnosis not present

## 2020-01-28 DIAGNOSIS — I10 Essential (primary) hypertension: Secondary | ICD-10-CM | POA: Diagnosis not present

## 2020-01-28 DIAGNOSIS — D509 Iron deficiency anemia, unspecified: Secondary | ICD-10-CM | POA: Diagnosis not present

## 2020-01-28 DIAGNOSIS — D649 Anemia, unspecified: Secondary | ICD-10-CM | POA: Diagnosis not present

## 2020-01-28 LAB — COMPREHENSIVE METABOLIC PANEL
Calcium: 9.3 (ref 8.7–10.7)
GFR calc Af Amer: 34.09
GFR calc non Af Amer: 29.41

## 2020-01-28 LAB — BASIC METABOLIC PANEL
BUN: 50 — AB (ref 4–21)
CO2: 19 (ref 13–22)
Chloride: 108 (ref 99–108)
Creatinine: 2 — AB (ref 0.6–1.3)
Glucose: 89
Potassium: 5.2 (ref 3.4–5.3)
Sodium: 138 (ref 137–147)

## 2020-01-29 DIAGNOSIS — D509 Iron deficiency anemia, unspecified: Secondary | ICD-10-CM | POA: Diagnosis not present

## 2020-01-29 DIAGNOSIS — I69828 Other speech and language deficits following other cerebrovascular disease: Secondary | ICD-10-CM | POA: Diagnosis not present

## 2020-01-29 DIAGNOSIS — R1310 Dysphagia, unspecified: Secondary | ICD-10-CM | POA: Diagnosis not present

## 2020-02-01 DIAGNOSIS — I69828 Other speech and language deficits following other cerebrovascular disease: Secondary | ICD-10-CM | POA: Diagnosis not present

## 2020-02-01 DIAGNOSIS — R1310 Dysphagia, unspecified: Secondary | ICD-10-CM | POA: Diagnosis not present

## 2020-02-01 DIAGNOSIS — D509 Iron deficiency anemia, unspecified: Secondary | ICD-10-CM | POA: Diagnosis not present

## 2020-02-03 DIAGNOSIS — I69828 Other speech and language deficits following other cerebrovascular disease: Secondary | ICD-10-CM | POA: Diagnosis not present

## 2020-02-03 DIAGNOSIS — D509 Iron deficiency anemia, unspecified: Secondary | ICD-10-CM | POA: Diagnosis not present

## 2020-02-03 DIAGNOSIS — R1310 Dysphagia, unspecified: Secondary | ICD-10-CM | POA: Diagnosis not present

## 2020-02-04 DIAGNOSIS — R1310 Dysphagia, unspecified: Secondary | ICD-10-CM | POA: Diagnosis not present

## 2020-02-04 DIAGNOSIS — I69828 Other speech and language deficits following other cerebrovascular disease: Secondary | ICD-10-CM | POA: Diagnosis not present

## 2020-02-04 DIAGNOSIS — D509 Iron deficiency anemia, unspecified: Secondary | ICD-10-CM | POA: Diagnosis not present

## 2020-02-04 DIAGNOSIS — I10 Essential (primary) hypertension: Secondary | ICD-10-CM | POA: Diagnosis not present

## 2020-02-04 DIAGNOSIS — D649 Anemia, unspecified: Secondary | ICD-10-CM | POA: Diagnosis not present

## 2020-02-04 LAB — COMPREHENSIVE METABOLIC PANEL
Calcium: 9.6 (ref 8.7–10.7)
GFR calc Af Amer: 39.96
GFR calc non Af Amer: 33.96

## 2020-02-04 LAB — BASIC METABOLIC PANEL
BUN: 37 — AB (ref 4–21)
CO2: 19 (ref 13–22)
Chloride: 107 (ref 99–108)
Creatinine: 1.7 — AB (ref 0.6–1.3)
Glucose: 87
Potassium: 4.4 (ref 3.4–5.3)
Sodium: 139 (ref 137–147)

## 2020-02-05 DIAGNOSIS — D509 Iron deficiency anemia, unspecified: Secondary | ICD-10-CM | POA: Diagnosis not present

## 2020-02-05 DIAGNOSIS — I69828 Other speech and language deficits following other cerebrovascular disease: Secondary | ICD-10-CM | POA: Diagnosis not present

## 2020-02-05 DIAGNOSIS — R1310 Dysphagia, unspecified: Secondary | ICD-10-CM | POA: Diagnosis not present

## 2020-02-06 DIAGNOSIS — I69828 Other speech and language deficits following other cerebrovascular disease: Secondary | ICD-10-CM | POA: Diagnosis not present

## 2020-02-06 DIAGNOSIS — R1310 Dysphagia, unspecified: Secondary | ICD-10-CM | POA: Diagnosis not present

## 2020-02-06 DIAGNOSIS — D509 Iron deficiency anemia, unspecified: Secondary | ICD-10-CM | POA: Diagnosis not present

## 2020-02-07 DIAGNOSIS — D509 Iron deficiency anemia, unspecified: Secondary | ICD-10-CM | POA: Diagnosis not present

## 2020-02-07 DIAGNOSIS — R1310 Dysphagia, unspecified: Secondary | ICD-10-CM | POA: Diagnosis not present

## 2020-02-07 DIAGNOSIS — I69828 Other speech and language deficits following other cerebrovascular disease: Secondary | ICD-10-CM | POA: Diagnosis not present

## 2020-02-09 DIAGNOSIS — I69828 Other speech and language deficits following other cerebrovascular disease: Secondary | ICD-10-CM | POA: Diagnosis not present

## 2020-02-09 DIAGNOSIS — R1310 Dysphagia, unspecified: Secondary | ICD-10-CM | POA: Diagnosis not present

## 2020-02-09 DIAGNOSIS — D509 Iron deficiency anemia, unspecified: Secondary | ICD-10-CM | POA: Diagnosis not present

## 2020-02-10 DIAGNOSIS — I69828 Other speech and language deficits following other cerebrovascular disease: Secondary | ICD-10-CM | POA: Diagnosis not present

## 2020-02-10 DIAGNOSIS — R1310 Dysphagia, unspecified: Secondary | ICD-10-CM | POA: Diagnosis not present

## 2020-02-10 DIAGNOSIS — D509 Iron deficiency anemia, unspecified: Secondary | ICD-10-CM | POA: Diagnosis not present

## 2020-02-11 DIAGNOSIS — R1310 Dysphagia, unspecified: Secondary | ICD-10-CM | POA: Diagnosis not present

## 2020-02-11 DIAGNOSIS — I739 Peripheral vascular disease, unspecified: Secondary | ICD-10-CM | POA: Diagnosis not present

## 2020-02-11 DIAGNOSIS — M2141 Flat foot [pes planus] (acquired), right foot: Secondary | ICD-10-CM | POA: Diagnosis not present

## 2020-02-11 DIAGNOSIS — R262 Difficulty in walking, not elsewhere classified: Secondary | ICD-10-CM | POA: Diagnosis not present

## 2020-02-11 DIAGNOSIS — I69828 Other speech and language deficits following other cerebrovascular disease: Secondary | ICD-10-CM | POA: Diagnosis not present

## 2020-02-11 DIAGNOSIS — D509 Iron deficiency anemia, unspecified: Secondary | ICD-10-CM | POA: Diagnosis not present

## 2020-02-11 DIAGNOSIS — B351 Tinea unguium: Secondary | ICD-10-CM | POA: Diagnosis not present

## 2020-02-11 DIAGNOSIS — M2142 Flat foot [pes planus] (acquired), left foot: Secondary | ICD-10-CM | POA: Diagnosis not present

## 2020-02-12 DIAGNOSIS — H353231 Exudative age-related macular degeneration, bilateral, with active choroidal neovascularization: Secondary | ICD-10-CM | POA: Diagnosis not present

## 2020-02-12 DIAGNOSIS — H35371 Puckering of macula, right eye: Secondary | ICD-10-CM | POA: Diagnosis not present

## 2020-02-13 DIAGNOSIS — I69828 Other speech and language deficits following other cerebrovascular disease: Secondary | ICD-10-CM | POA: Diagnosis not present

## 2020-02-13 DIAGNOSIS — D509 Iron deficiency anemia, unspecified: Secondary | ICD-10-CM | POA: Diagnosis not present

## 2020-02-13 DIAGNOSIS — R1310 Dysphagia, unspecified: Secondary | ICD-10-CM | POA: Diagnosis not present

## 2020-02-15 DIAGNOSIS — R1310 Dysphagia, unspecified: Secondary | ICD-10-CM | POA: Diagnosis not present

## 2020-02-15 DIAGNOSIS — D509 Iron deficiency anemia, unspecified: Secondary | ICD-10-CM | POA: Diagnosis not present

## 2020-02-15 DIAGNOSIS — I69828 Other speech and language deficits following other cerebrovascular disease: Secondary | ICD-10-CM | POA: Diagnosis not present

## 2020-02-16 DIAGNOSIS — R1310 Dysphagia, unspecified: Secondary | ICD-10-CM | POA: Diagnosis not present

## 2020-02-16 DIAGNOSIS — D509 Iron deficiency anemia, unspecified: Secondary | ICD-10-CM | POA: Diagnosis not present

## 2020-02-16 DIAGNOSIS — I69828 Other speech and language deficits following other cerebrovascular disease: Secondary | ICD-10-CM | POA: Diagnosis not present

## 2020-02-17 DIAGNOSIS — I69828 Other speech and language deficits following other cerebrovascular disease: Secondary | ICD-10-CM | POA: Diagnosis not present

## 2020-02-17 DIAGNOSIS — D509 Iron deficiency anemia, unspecified: Secondary | ICD-10-CM | POA: Diagnosis not present

## 2020-02-17 DIAGNOSIS — R1310 Dysphagia, unspecified: Secondary | ICD-10-CM | POA: Diagnosis not present

## 2020-02-18 ENCOUNTER — Non-Acute Institutional Stay (SKILLED_NURSING_FACILITY): Payer: Medicare Other | Admitting: Internal Medicine

## 2020-02-18 DIAGNOSIS — I69828 Other speech and language deficits following other cerebrovascular disease: Secondary | ICD-10-CM | POA: Diagnosis not present

## 2020-02-18 DIAGNOSIS — G20 Parkinson's disease: Secondary | ICD-10-CM

## 2020-02-18 DIAGNOSIS — D649 Anemia, unspecified: Secondary | ICD-10-CM | POA: Diagnosis not present

## 2020-02-18 DIAGNOSIS — F0281 Dementia in other diseases classified elsewhere with behavioral disturbance: Secondary | ICD-10-CM

## 2020-02-18 DIAGNOSIS — F02818 Dementia in other diseases classified elsewhere, unspecified severity, with other behavioral disturbance: Secondary | ICD-10-CM

## 2020-02-18 DIAGNOSIS — N183 Chronic kidney disease, stage 3 unspecified: Secondary | ICD-10-CM | POA: Diagnosis not present

## 2020-02-18 DIAGNOSIS — D509 Iron deficiency anemia, unspecified: Secondary | ICD-10-CM | POA: Diagnosis not present

## 2020-02-18 DIAGNOSIS — I10 Essential (primary) hypertension: Secondary | ICD-10-CM

## 2020-02-18 DIAGNOSIS — R1310 Dysphagia, unspecified: Secondary | ICD-10-CM | POA: Diagnosis not present

## 2020-02-18 NOTE — Progress Notes (Signed)
Location:  [facility] Room Number: 215-D Place of Service:  SNF [phone]) Provider: Granville Lewis, P.A.  PCP: Gayland Curry, DO  Patient Care Team: Gayland Curry, DO as PCP - General (Geriatric Medicine) Rehab, [place] ([place])  Extended Emergency Contact Information Primary Emergency Contact: Orpah Melter Address: [place], [place] [address] [name] of [place] Phone: [phone] Mobile Phone: [phone] Relation: Son  Code Status: DNR  Goals of care: Advanced Directive information Advanced Directives 01/25/2020  Does Patient Have a Medical Advance Directive? Yes  Type of Paramedic of [place];Out of facility DNR (pink MOST or yellow form)  Does patient want to make changes to medical advance directive? No - Patient declined  Copy of [place] in Chart? Yes - validated most recent copy scanned in chart (See row information)  Would patient like information on creating a medical advance directive? -  Pre-existing out of facility DNR order (yellow form or pink MOST form) Yellow form placed in chart (order not valid for inpatient use)    Chief complaint-routine visit for medical management of chronic medical conditions including Parkinson's disease with dementia-dysphagia-anemia-chronic kidney disease stage III-hypertension-   HPI:  Pt is a 86 y.o. male seen today for medical management of chronic diseases.  As noted above.  Clinically he appears to be at baseline he does have a history of significant dementia.  Associated with Parkinson's disease but appears to be of fairly well with supportive care-she continues on Seroquel at night for hallucinations-dose was recently reduced secondary to fall risk.  He continues on a pured diet for his dysphagia.  He also has a history of macular degeneration and does get eye injections from  ophthalmology.  He appears to be at his baseline today he is resting in bed he is alert does respond to verbal commands-later in the day I saw he was up in his wheelchair receiving his medications appear to be relatively bright and alert and at baseline.  Recently we have been following his renal function and his creatinine at times does rise we did at 1 point give him a short course of IV fluids.  Most recent creatinine was 1.74 on lab done on May 27 and this actually appears to be somewhat improved compared to his recent baseline which was hovering around 2.  It was as high as 2.45 at one-point.  He also has a history of chronic anemia and last hemoglobin in April was 8.6 which is baseline for him at times he is in the sevens.  He was recently seen by Dr. Mariea Clonts and statin was discontinued secondary to his age and comorbidities.     Past Medical History:  Diagnosis Date  . Alzheimer's dementia ([place])   . Hypertension   . Parkinson disease ([place])   . Prostate cancer ([place])   . Renal disorder   . Syncope    Past Surgical History:  Procedure Laterality Date  . BACK SURGERY    . IR VERTEBROPLASTY CERV/THOR BX INC UNI/BIL INC/INJECT/IMAGING  01/31/2018  . IR VERTEBROPLASTY CERV/THOR BX INC UNI/BIL INC/INJECT/IMAGING  03/24/2019    No Known Allergies  Outpatient Encounter Medications as of 02/18/2020  Medication Sig  . amLODipine (NORVASC) 2.5 MG tablet Take 1 tablet (2.5 mg total) by mouth daily.  Marland Kitchen ascorbic acid (VITAMIN C) 500 MG tablet Take 500  mg by mouth daily.  Marland Kitchen aspirin 81 MG chewable tablet Chew 1 tablet (81 mg total) by mouth daily.  . carbidopa-levodopa (SINEMET IR) 25-250 MG tablet Take 1 tablet by mouth 3 (three) times daily. 9am, 1pm, 7pm  . Carboxymethylcellulose Sodium (ARTIFICIAL TEARS OP) Apply to eye. 1 drop into both eye as needed 4 times daily  . Cholecalciferol (D3-50) 1.25 MG (50000 UT) capsule Take 50,000 Units by mouth every 30 (thirty) days.  . cloNIDine  (CATAPRES) 0.2 MG tablet Take 0.2 mg by mouth 3 (three) times daily. 9am, 1pm, 7pm  . feeding supplement, ENSURE ENLIVE, (ENSURE ENLIVE) LIQD Take 237 mLs by mouth 2 (two) times daily between meals.  Marland Kitchen guaiFENesin (MUCINEX) 600 MG 12 hr tablet Take 600 mg by mouth 2 (two) times daily.  . Infant Care Products (BABY SHAMPOO EX) Apply topically. Baby shampo ( less than a dime size ) with warm water can be use for eye hygiene  . Multiple Vitamin (MULTIVITAMIN WITH MINERALS) TABS tablet Take 1 tablet by mouth daily.  . NON FORMULARY Diet Order: Pureed Diet, no therapeutic restrictions  . omeprazole (PRILOSEC) 40 MG capsule Take 40 mg by mouth daily.  . polyethylene glycol (MIRALAX / GLYCOLAX) 17 g packet Take 17 g by mouth daily as needed for mild constipation.  . QUEtiapine (SEROQUEL) 50 MG tablet Take 25 mg by mouth daily at 6 PM.   . thiamine (VITAMIN B-1) 100 MG tablet Take 100 mg by mouth daily. Take at 1 PM   No facility-administered encounter medications on file as of 02/18/2020.    Review of Systems   This is unobtainable secondary to dementia-however he denies any pain or discomfort at this time nursing does not report any recent acute issues- have been encouraging fluids.    Immunization History  Administered Date(s) Administered  . Fluad Quad(high Dose 65+) 05/23/2019  . Influenza, High Dose Seasonal PF 06/26/2017, 06/17/2018  . Influenza-Unspecified 06/22/2016, 06/26/2017  . Moderna SARS-COVID-2 Vaccination 10/08/2019, 11/05/2019  . Pneumococcal Polysaccharide-23 01/07/2015, 05/11/2017  . Zoster Recombinat (Shingrix) 12/11/2017, 03/18/2018   Pertinent  Health Maintenance Due  Topic Date Due  . PNA vac Low Risk Adult (2 of 2 - PCV13) 05/11/2018  . INFLUENZA VACCINE  04/10/2020   Fall Risk  01/27/2020  Risk for fall due to : History of fall(s);Impaired balance/gait;Impaired mobility;Medication side effect;Impaired vision;Mental status change  Follow up Falls evaluation  completed;Education provided;Falls prevention discussed   Functional Status Survey:    Temperature is 97.9 pulse 68 respirations 18 blood pressure 115/62  Physical Exam In general this is a frail elderly male in no distress lying comfortably in bed.  He is speaking some.  His skin is warm and dry he does have some chronic bruising which is baseline.  Eyes visual acuity appears to be intact sclera and conjunctive are clear.  Oropharynx is clear mucous membranes moist.  Chest is clear to auscultation with shallow air entry and somewhat poor respiratory effort.  Heart is regular rate and rhythm with distant heart sounds radial pulse is regular he does not have significant lower extremity edema.  Abdomen is soft nontender with positive bowel sounds.  Musculoskeletal does have rigidity and cogwheeling of his upper extremities bilaterally this appears baseline he is able to move all extremities x4.  Psych he does follow simple verbal commands with prompting--- he is oriented to self--- he is cooperative with exam Labs reviewed:  Feb 04, 2020.  Sodium 139 potassium 4.4 BUN 37.5 creatinine 1.74.  Previous creatinine is 1.96-1.93-2.45-.  December 31, 2019.  WBC 5.9 hemoglobin 8.6 platelets 165.  November 25 2019.  Creatinine 1.46-at that point hemoglobin was 7.9.  TSH 2.21.  Hemoglobin A1c 5.7.  Albumin was 3.7.  Otherwise liver function tests within normal limits.   Recent Labs    04/22/19 0441 04/22/19 0441 04/23/19 0428 05/20/19 1140 05/20/19 1900 05/22/19 0110 09/01/19 1645 09/01/19 1645 09/02/19 0422 09/03/19 0523 09/29/19 0000  NA 142   < > 143   < >  --    < > 137   < > 138 137 142  K 3.0*   < > 4.0   < >  --    < > 4.2   < > 4.6 4.4 3.8  CL 104   < > 110   < >  --    < > 103   < > 107 104 104  CO2 29   < > 26   < >  --    < > 25   < > 21* 24 24*  GLUCOSE 104*   < > 102*   < >  --    < > 108*  --  101* 92  --   BUN 28*   < > 28*   < >  --    < > 39*   < >  36* 33* 39*  CREATININE 1.84*   < > 1.73*   < >  --    < > 1.61*   < > 1.54* 1.23 1.5*  CALCIUM 7.9*   < > 7.9*   < >  --    < > 9.0   < > 8.9 9.3 10.0  MG 1.7  --  1.7  --  1.8  --   --   --   --   --   --    < > = values in this interval not displayed.   Recent Labs    03/30/19 0432 04/20/19 1701 05/20/19 1140  AST 16 35 10*  ALT 5 17 5   ALKPHOS 68 80 56  BILITOT 0.5 0.5 0.3  PROT 5.5* 6.3* 5.6*  ALBUMIN 3.2* 3.3* 2.9*   Recent Labs    06/01/19 1957 06/26/19 0000 09/01/19 1645 09/01/19 1645 09/02/19 0422 09/03/19 0523 09/29/19 0000  WBC   < > 7.1 6.1   < > 7.9 7.5 7.4  NEUTROABS  --  5 4.0  --   --  4.8  --   HGB   < > 10.1* 6.8*   < > 8.1* 9.6* 9.6*  HCT   < > 30* 23.0*   < > 27.1* 31.0* 29*  MCV   < >  --  89.8  --  92.8 91.4  --   PLT   < > 136* 247   < > 209 211 192   < > = values in this interval not displayed.   Lab Results  Component Value Date   TSH 1.798 05/21/2019   Lab Results  Component Value Date   HGBA1C 4.9 06/26/2019   Lab Results  Component Value Date   CHOL 207 (H) 03/30/2019   HDL 62 03/30/2019   LDLCALC 131 (H) 03/30/2019   TRIG 70 03/30/2019   CHOLHDL 3.3 03/30/2019    Significant Diagnostic Results in last 30 days:  No results found.  Assessment/Plan  #1 Parkinson's disease-this is quite advanced but appears to do well with supportive care-he does have dementia he does  have Seroquel at night to help with behaviors this was reduced secondary to fall risk.  At this point appears stable he is on Sinemet 25-53 times a day.  2.  History of dementia again please see #1-he is on Seroquel at night as noted above this appears to be stable nursing does not report any recent acute behaviors.  3.  History of stage III chronic kidney disease-at this point appears to be relatively stabilized with a creatinine of 1.74 on lab done in late May-will update this next week.  4.  History of anemia with chronicity-this appears stable with a  hemoglobin of 8.6 will have this updated as well next week.  5.  History of hypertension continues on clonidine 0.2 mg 3 times a day as well as Norvasc 2.5 mg a day-this appears fairly stable recent systolics range from 428 up to 160s but I do not see consistent elevations over 140s-at this point will monitor-again there were concerns at times with hypotension with  history of Parkinson's-neurogenic issues  #6 hyperlipidemia-again Crestor has been discontinued secondary to his advanced age and last LDL was quite low.  [id number]

## 2020-02-19 ENCOUNTER — Encounter: Payer: Self-pay | Admitting: Internal Medicine

## 2020-02-19 DIAGNOSIS — D509 Iron deficiency anemia, unspecified: Secondary | ICD-10-CM | POA: Diagnosis not present

## 2020-02-19 DIAGNOSIS — I69828 Other speech and language deficits following other cerebrovascular disease: Secondary | ICD-10-CM | POA: Diagnosis not present

## 2020-02-19 DIAGNOSIS — R1310 Dysphagia, unspecified: Secondary | ICD-10-CM | POA: Diagnosis not present

## 2020-02-22 DIAGNOSIS — I69828 Other speech and language deficits following other cerebrovascular disease: Secondary | ICD-10-CM | POA: Diagnosis not present

## 2020-02-22 DIAGNOSIS — R1310 Dysphagia, unspecified: Secondary | ICD-10-CM | POA: Diagnosis not present

## 2020-02-22 DIAGNOSIS — D509 Iron deficiency anemia, unspecified: Secondary | ICD-10-CM | POA: Diagnosis not present

## 2020-02-23 DIAGNOSIS — I69828 Other speech and language deficits following other cerebrovascular disease: Secondary | ICD-10-CM | POA: Diagnosis not present

## 2020-02-23 DIAGNOSIS — D509 Iron deficiency anemia, unspecified: Secondary | ICD-10-CM | POA: Diagnosis not present

## 2020-02-23 DIAGNOSIS — R1310 Dysphagia, unspecified: Secondary | ICD-10-CM | POA: Diagnosis not present

## 2020-02-24 DIAGNOSIS — D509 Iron deficiency anemia, unspecified: Secondary | ICD-10-CM | POA: Diagnosis not present

## 2020-02-24 DIAGNOSIS — R1310 Dysphagia, unspecified: Secondary | ICD-10-CM | POA: Diagnosis not present

## 2020-02-24 DIAGNOSIS — I69828 Other speech and language deficits following other cerebrovascular disease: Secondary | ICD-10-CM | POA: Diagnosis not present

## 2020-02-25 DIAGNOSIS — D649 Anemia, unspecified: Secondary | ICD-10-CM | POA: Diagnosis not present

## 2020-02-25 DIAGNOSIS — E119 Type 2 diabetes mellitus without complications: Secondary | ICD-10-CM | POA: Diagnosis not present

## 2020-02-25 DIAGNOSIS — I10 Essential (primary) hypertension: Secondary | ICD-10-CM | POA: Diagnosis not present

## 2020-02-25 LAB — CBC: RBC: 3.23 — AB (ref 3.87–5.11)

## 2020-02-25 LAB — BASIC METABOLIC PANEL
BUN: 40 — AB (ref 4–21)
CO2: 20 (ref 13–22)
Chloride: 110 — AB (ref 99–108)
Creatinine: 1.5 — AB (ref 0.6–1.3)
Glucose: 86
Potassium: 4.7 (ref 3.4–5.3)
Sodium: 138 (ref 137–147)

## 2020-02-25 LAB — COMPREHENSIVE METABOLIC PANEL
Calcium: 9.7 (ref 8.7–10.7)
GFR calc Af Amer: 47.08
GFR calc non Af Amer: 40.62

## 2020-02-25 LAB — HEMOGLOBIN A1C: Hemoglobin A1C: 5.7

## 2020-02-25 LAB — CBC AND DIFFERENTIAL
HCT: 28 — AB (ref 41–53)
Hemoglobin: 8.9 — AB (ref 13.5–17.5)
Platelets: 151 (ref 150–399)
WBC: 5.8

## 2020-03-08 ENCOUNTER — Other Ambulatory Visit: Payer: Self-pay

## 2020-03-08 ENCOUNTER — Telehealth: Payer: Self-pay | Admitting: Family

## 2020-03-08 ENCOUNTER — Emergency Department (HOSPITAL_BASED_OUTPATIENT_CLINIC_OR_DEPARTMENT_OTHER)
Admission: EM | Admit: 2020-03-08 | Discharge: 2020-03-09 | Disposition: A | Discharge status: Skilled Nursing Facility | Payer: Medicare Other | Attending: Emergency Medicine | Admitting: Emergency Medicine

## 2020-03-08 ENCOUNTER — Emergency Department (HOSPITAL_BASED_OUTPATIENT_CLINIC_OR_DEPARTMENT_OTHER): Payer: Medicare Other

## 2020-03-08 ENCOUNTER — Encounter (HOSPITAL_BASED_OUTPATIENT_CLINIC_OR_DEPARTMENT_OTHER): Payer: Self-pay | Admitting: Emergency Medicine

## 2020-03-08 DIAGNOSIS — Z79899 Other long term (current) drug therapy: Secondary | ICD-10-CM | POA: Diagnosis not present

## 2020-03-08 DIAGNOSIS — Z7401 Bed confinement status: Secondary | ICD-10-CM | POA: Diagnosis not present

## 2020-03-08 DIAGNOSIS — F0281 Dementia in other diseases classified elsewhere with behavioral disturbance: Secondary | ICD-10-CM | POA: Diagnosis not present

## 2020-03-08 DIAGNOSIS — Z87891 Personal history of nicotine dependence: Secondary | ICD-10-CM | POA: Insufficient documentation

## 2020-03-08 DIAGNOSIS — S0990XA Unspecified injury of head, initial encounter: Secondary | ICD-10-CM

## 2020-03-08 DIAGNOSIS — Z8546 Personal history of malignant neoplasm of prostate: Secondary | ICD-10-CM | POA: Insufficient documentation

## 2020-03-08 DIAGNOSIS — G20 Parkinson's disease: Secondary | ICD-10-CM | POA: Insufficient documentation

## 2020-03-08 DIAGNOSIS — I6782 Cerebral ischemia: Secondary | ICD-10-CM | POA: Diagnosis not present

## 2020-03-08 DIAGNOSIS — Y939 Activity, unspecified: Secondary | ICD-10-CM | POA: Insufficient documentation

## 2020-03-08 DIAGNOSIS — W19XXXA Unspecified fall, initial encounter: Secondary | ICD-10-CM | POA: Diagnosis not present

## 2020-03-08 DIAGNOSIS — S0121XA Laceration without foreign body of nose, initial encounter: Secondary | ICD-10-CM | POA: Diagnosis not present

## 2020-03-08 DIAGNOSIS — R410 Disorientation, unspecified: Secondary | ICD-10-CM | POA: Diagnosis not present

## 2020-03-08 DIAGNOSIS — Y999 Unspecified external cause status: Secondary | ICD-10-CM | POA: Insufficient documentation

## 2020-03-08 DIAGNOSIS — Z7982 Long term (current) use of aspirin: Secondary | ICD-10-CM | POA: Insufficient documentation

## 2020-03-08 DIAGNOSIS — Y929 Unspecified place or not applicable: Secondary | ICD-10-CM | POA: Insufficient documentation

## 2020-03-08 DIAGNOSIS — R404 Transient alteration of awareness: Secondary | ICD-10-CM | POA: Diagnosis not present

## 2020-03-08 DIAGNOSIS — M2550 Pain in unspecified joint: Secondary | ICD-10-CM | POA: Diagnosis not present

## 2020-03-08 DIAGNOSIS — R531 Weakness: Secondary | ICD-10-CM | POA: Diagnosis not present

## 2020-03-08 DIAGNOSIS — M47812 Spondylosis without myelopathy or radiculopathy, cervical region: Secondary | ICD-10-CM | POA: Diagnosis not present

## 2020-03-08 DIAGNOSIS — I129 Hypertensive chronic kidney disease with stage 1 through stage 4 chronic kidney disease, or unspecified chronic kidney disease: Secondary | ICD-10-CM | POA: Insufficient documentation

## 2020-03-08 DIAGNOSIS — S199XXA Unspecified injury of neck, initial encounter: Secondary | ICD-10-CM | POA: Diagnosis not present

## 2020-03-08 DIAGNOSIS — R6889 Other general symptoms and signs: Secondary | ICD-10-CM | POA: Diagnosis not present

## 2020-03-08 DIAGNOSIS — I6389 Other cerebral infarction: Secondary | ICD-10-CM | POA: Diagnosis not present

## 2020-03-08 DIAGNOSIS — N183 Chronic kidney disease, stage 3 unspecified: Secondary | ICD-10-CM | POA: Diagnosis not present

## 2020-03-08 DIAGNOSIS — M4319 Spondylolisthesis, multiple sites in spine: Secondary | ICD-10-CM | POA: Diagnosis not present

## 2020-03-08 DIAGNOSIS — G309 Alzheimer's disease, unspecified: Secondary | ICD-10-CM | POA: Insufficient documentation

## 2020-03-08 DIAGNOSIS — J439 Emphysema, unspecified: Secondary | ICD-10-CM | POA: Diagnosis not present

## 2020-03-08 DIAGNOSIS — Z743 Need for continuous supervision: Secondary | ICD-10-CM | POA: Diagnosis not present

## 2020-03-08 MED ORDER — LIDOCAINE-EPINEPHRINE (PF) 2 %-1:200000 IJ SOLN
10.0000 mL | Freq: Once | INTRAMUSCULAR | Status: AC
  Administered 2020-03-08: 10 mL via INTRADERMAL
  Filled 2020-03-08: qty 10

## 2020-03-08 NOTE — ED Provider Notes (Signed)
[facility] EMERGENCY DEPARTMENT Provider Note   CSN: [id number] Arrival date & time: 03/08/20  2156     History Chief Complaint  Patient presents with  . Facial Pain  . Fall    STRYKER VEASEY is a 86 y.o. male.  86 yo M with a cc of a fall.  Non syncopal.  Witnessed.  Has a lack to the bridge of the nose.  And some mild pain to the left hand.  Denies other area of injury denies neck pain denies chest pain denies abdominal pain.  The history is provided by the patient.  Fall This is a new problem. The current episode started 2 days ago. The problem occurs constantly. The problem has not changed since onset.Associated symptoms include headaches. Pertinent negatives include no chest pain, no abdominal pain and no shortness of breath. Nothing aggravates the symptoms. Nothing relieves the symptoms. He has tried nothing for the symptoms. The treatment provided no relief.       Past Medical History:  Diagnosis Date  . Alzheimer's dementia ([place])   . Hypertension   . Parkinson disease ([place])   . Prostate cancer ([place])   . Renal disorder   . Syncope     Patient Active Problem List   Diagnosis Date Noted  . Severe anemia 09/01/2019  . Dementia with behavioral disturbance ([place]) 05/30/2019  . Hyperlipidemia 05/30/2019  . Bradycardia 05/22/2019  . Goals of care, counseling/discussion   . Palliative care by specialist   . Uncontrolled hypertension 05/21/2019  . Normocytic anemia 05/21/2019  . CKD (chronic kidney disease), stage III 05/21/2019  . Physical deconditioning 05/21/2019  . Hematuria 05/21/2019  . UTI (urinary tract infection) 05/20/2019  . Hypokalemia 04/20/2019  . Hypomagnesemia 04/20/2019  . Parkinson disease ([place]) 04/20/2019  . Cardiac arrhythmia 04/20/2019  . Acute renal failure superimposed on stage 3 chronic kidney disease ([place]) 04/20/2019  . Protein-calorie malnutrition, severe 03/31/2019  . Cerebral embolism with cerebral infarction 03/30/2019  .  POTS (postural orthostatic tachycardia syndrome) 03/28/2019  . HTN (hypertension) 02/04/2018  . Prostate cancer ([place]) 02/04/2018  . Orthostatic hypotension 02/04/2018  . Iron deficiency anemia 02/04/2018  . Syncope 02/02/2018    Past Surgical History:  Procedure Laterality Date  . BACK SURGERY    . IR VERTEBROPLASTY CERV/THOR BX INC UNI/BIL INC/INJECT/IMAGING  01/31/2018  . IR VERTEBROPLASTY CERV/THOR BX INC UNI/BIL INC/INJECT/IMAGING  03/24/2019       Family History  Problem Relation Age of Onset  . Heart attack Father 21    Social History   Tobacco Use  . Smoking status: Former Smoker    Packs/day: 2.00    Years: 30.00    Pack years: 60.00    Types: Cigarettes    Quit date: 1990    Years since quitting: 31.5  . Smokeless tobacco: Never Used  Vaping Use  . Vaping Use: Never used  Substance Use Topics  . Alcohol use: Yes    Alcohol/week: 14.0 standard drinks    Types: 14 Cans of beer per week  . Drug use: Never    Home Medications Prior to Admission medications   Medication Sig Start Date End Date Taking? Authorizing Provider  amLODipine (NORVASC) 2.5 MG tablet Take 1 tablet (2.5 mg total) by mouth daily. 05/26/19   Hongalgi, Lenis Dickinson, MD  ascorbic acid (VITAMIN C) 500 MG tablet Take 500 mg by mouth daily.    [provider]  aspirin 81 MG chewable tablet Chew 1 tablet (81 mg total)  by mouth daily. 04/01/19   Nita Sells, MD  carbidopa-levodopa (SINEMET IR) 25-250 MG tablet Take 1 tablet by mouth 3 (three) times daily. 9am, 1pm, 7pm    [provider]  Carboxymethylcellulose Sodium (ARTIFICIAL TEARS OP) Apply to eye. 1 drop into both eye as needed 4 times daily    [provider]  Cholecalciferol (D3-50) 1.25 MG (50000 UT) capsule Take 50,000 Units by mouth every 30 (thirty) days.    [provider]  cloNIDine (CATAPRES) 0.2 MG tablet Take 0.2 mg by mouth 3 (three) times daily. 9am, 1pm, 7pm    [provider]   feeding supplement, ENSURE ENLIVE, (ENSURE ENLIVE) LIQD Take 237 mLs by mouth 2 (two) times daily between meals. 05/25/19   Hongalgi, Lenis Dickinson, MD  guaiFENesin (MUCINEX) 600 MG 12 hr tablet Take 600 mg by mouth 2 (two) times daily.    [provider]  Infant Care Products (BABY SHAMPOO EX) Apply topically. Baby shampo ( less than a dime size ) with warm water can be use for eye hygiene    [provider]  Multiple Vitamin (MULTIVITAMIN WITH MINERALS) TABS tablet Take 1 tablet by mouth daily. 05/26/19   Hongalgi, Lenis Dickinson, MD  NON FORMULARY Diet Order: Pureed Diet, no therapeutic restrictions    [provider]  omeprazole (PRILOSEC) 40 MG capsule Take 40 mg by mouth daily.    [provider]  polyethylene glycol (MIRALAX / GLYCOLAX) 17 g packet Take 17 g by mouth daily as needed for mild constipation. 04/23/19   Aline August, MD  QUEtiapine (SEROQUEL) 50 MG tablet Take 25 mg by mouth daily at 6 PM.     [provider]  thiamine (VITAMIN B-1) 100 MG tablet Take 100 mg by mouth daily. Take at 1 PM    [provider]    Allergies    Patient has no known allergies.  Review of Systems   Review of Systems  Constitutional: Negative for chills and fever.  HENT: Negative for congestion and facial swelling.   Eyes: Negative for discharge and visual disturbance.  Respiratory: Negative for shortness of breath.   Cardiovascular: Negative for chest pain and palpitations.  Gastrointestinal: Negative for abdominal pain, diarrhea and vomiting.  Musculoskeletal: Negative for arthralgias and myalgias.  Skin: Positive for wound. Negative for color change and rash.  Neurological: Positive for headaches. Negative for tremors and syncope.  Psychiatric/Behavioral: Negative for confusion and dysphoric mood.    Physical Exam Updated Vital Signs BP (!) 164/87 (BP Location: Right Arm)   Pulse 81   Temp 98.2 F (36.8 C) (Oral)   Resp 16   Ht 6\' 1"  (1.854 m)    Wt 60.9 kg   SpO2 96%   BMI 17.71 kg/m   Physical Exam Vitals and nursing note reviewed.  Constitutional:      Appearance: He is well-developed.  HENT:     Head: Normocephalic.     Comments: Approximately 1.2 cm laceration to the bridge of the nose.  No nasal septal hematoma. Eyes:     Pupils: Pupils are equal, round, and reactive to light.  Neck:     Vascular: No JVD.  Cardiovascular:     Rate and Rhythm: Normal rate and regular rhythm.     Heart sounds: No murmur heard.  No friction rub. No gallop.   Pulmonary:     Effort: No respiratory distress.     Breath sounds: No wheezing.  Abdominal:     General:  There is no distension.     Tenderness: There is no guarding or rebound.  Musculoskeletal:        General: Normal range of motion.     Cervical back: Normal range of motion and neck supple.     Comments: Skin tear to the dorsum of the left hand without any bony tenderness.  Full range of motion of the hand.  Skin:    Coloration: Skin is not pale.     Findings: No rash.  Neurological:     Mental Status: He is alert and oriented to person, place, and time.  Psychiatric:        Behavior: Behavior normal.     ED Results / Procedures / Treatments   Labs (all labs ordered are listed, but only abnormal results are displayed) Labs Reviewed - No data to display  EKG None  Radiology CT Head Wo Contrast  Result Date: 03/08/2020 CLINICAL DATA:  Dementia patient post fall this evening. Laceration to bridge of nose. EXAM: CT HEAD WITHOUT CONTRAST TECHNIQUE: Contiguous axial images were obtained from the base of the skull through the vertex without intravenous contrast. COMPARISON:  Head CT 06/01/2019 FINDINGS: Brain: No acute intracranial hemorrhage. Generalized atrophy with questionable progression from prior. Moderate to advanced chronic small vessel ischemia. Remote lacunar infarcts in the bilateral basal ganglia, bilateral thalami and right caudate. No subdural or  extra-axial collection. No midline shift or hydrocephalus. No evidence of acute ischemia. Vascular: Atherosclerosis of skullbase vasculature without hyperdense vessel or abnormal calcification. Skull: No fracture or focal lesion. Sinuses/Orbits: Paranasal sinuses and mastoid air cells are clear. The visualized orbits are unremarkable. No evidence of nasal bone fracture. Remote right maxillary sinus fracture. Bilateral cataract resection. Other: None. IMPRESSION: 1. No acute intracranial abnormality. No skull fracture. 2. Generalized atrophy and chronic small vessel ischemia. Multiple remote lacunar infarcts. Electronically Signed   By: Keith Rake M.D.   On: 03/08/2020 22:49   CT Cervical Spine Wo Contrast  Result Date: 03/08/2020 CLINICAL DATA:  Neck trauma fall EXAM: CT CERVICAL SPINE WITHOUT CONTRAST TECHNIQUE: Multidetector CT imaging of the cervical spine was performed without intravenous contrast. Multiplanar CT image reconstructions were also generated. COMPARISON:  CT angiography 03/29/2019 FINDINGS: Alignment: Exaggerated lordosis. Trace anterolisthesis C6 on C7 and C7 on T1 without change. Facet alignment is maintained. Skull base and vertebrae: Chronic compression deformities at T1 and T2. No acute fracture is seen. Soft tissues and spinal canal: No prevertebral fluid or swelling. No visible canal hematoma. Disc levels: Moderate advanced degenerative changes throughout the cervical spine. Interbody and facet ankylosis at C4-C5. Upper chest: Emphysema Other: None IMPRESSION: 1. Chronic compression deformities at T1 and T2. No acute fracture identified. 2. Moderate 2 advanced degenerative changes throughout the cervical spine. Electronically Signed   By: Donavan Foil M.D.   On: 03/08/2020 22:51    Procedures .Marland KitchenLaceration Repair  Date/Time: 03/08/2020 11:40 PM Performed by: Deno Etienne, DO Authorized by: Deno Etienne, DO   Consent:    Consent obtained:  Verbal   Consent given by:  Patient  and parent   Risks discussed:  Infection, pain, poor cosmetic result and poor wound healing   Alternatives discussed:  No treatment, delayed treatment and observation Anesthesia (see MAR for exact dosages):    Anesthesia method:  Local infiltration   Local anesthetic:  Lidocaine 2% WITH epi Laceration details:    Location:  Face   Face location:  Nose   Length (cm):  1.6 Repair type:  Repair type:  Simple Exploration:    Hemostasis achieved with:  Epinephrine and direct pressure   Contaminated: no   Treatment:    Area cleansed with:  Saline   Amount of cleaning:  Extensive   Irrigation solution:  Sterile saline   Irrigation volume:  50   Irrigation method:  Syringe   Visualized foreign bodies/material removed: no   Skin repair:    Repair method:  Sutures   Suture size:  5-0   Suture material:  Fast-absorbing gut   Suture technique:  Simple interrupted   Number of sutures:  3 Approximation:    Approximation:  Close Post-procedure details:    Dressing:  Open (no dressing)   Patient tolerance of procedure:  Tolerated well, no immediate complications   (including critical care time)  Medications Ordered in ED Medications  lidocaine-EPINEPHrine (XYLOCAINE W/EPI) 2 %-1:200000 (PF) injection 10 mL (10 mLs Intradermal Given by Other 03/08/20 2243)    ED Course  I have reviewed the triage vital signs and the nursing notes.  Pertinent labs & imaging results that were available during my care of the patient were reviewed by me and considered in my medical decision making (see chart for details).    MDM Rules/Calculators/A&P                          86 yo M with a chief complaints of fall.  Patient has a laceration to the bridge of his nose that was repaired at bedside.  CT of the head and C-spine are negative.  DC home.  11:41 PM:  I have discussed the diagnosis/risks/treatment options with the patient and believe the pt to be eligible for discharge home to follow-up with  PCP. We also discussed returning to the ED immediately if new or worsening sx occur. We discussed the sx which are most concerning (e.g., sudden worsening pain, fever, inability to tolerate by mouth) that necessitate immediate return. Medications administered to the patient during their visit and any new prescriptions provided to the patient are listed below.  Medications given during this visit Medications  lidocaine-EPINEPHrine (XYLOCAINE W/EPI) 2 %-1:200000 (PF) injection 10 mL (10 mLs Intradermal Given by Other 03/08/20 2243)     The patient appears reasonably screen and/or stabilized for discharge and I doubt any other medical condition or other [facility] requiring further screening, evaluation, or treatment in the ED at this time prior to discharge.   Final Clinical Impression(s) / ED Diagnoses Final diagnoses:  Nasal laceration, initial encounter  Closed head injury, initial encounter    Rx / DC Orders ED Discharge Orders    None       Deno Etienne, DO 03/08/20 2341

## 2020-03-08 NOTE — Telephone Encounter (Signed)
Facility Nurse called states patient observed on the floor with bleeding unclear whether from the mouth or head suspect might have hit the trash can.States patient is cold and shivering.CNA still obtaining vital signs.request to send to ED for evaluation.order given to send to ED.

## 2020-03-08 NOTE — Discharge Instructions (Signed)
Follow up with your family doc.  Your stitches should dissolve on their own.  If they are still there after 5 days he can gently plucked them out with tweezers.  Please return for redness drainage or fever.

## 2020-03-08 NOTE — ED Triage Notes (Signed)
Patient presents from nursing facility with complaints of fall this evening; patient has laceration to bridge of nose and skin tear to left hand; per EMS patient at baseline mental status; denies LOC. C collar in place per EMS

## 2020-03-09 ENCOUNTER — Non-Acute Institutional Stay (SKILLED_NURSING_FACILITY): Payer: Medicare Other | Admitting: Internal Medicine

## 2020-03-09 ENCOUNTER — Encounter: Payer: Self-pay | Admitting: Internal Medicine

## 2020-03-09 DIAGNOSIS — N183 Chronic kidney disease, stage 3 unspecified: Secondary | ICD-10-CM

## 2020-03-09 DIAGNOSIS — S0121XD Laceration without foreign body of nose, subsequent encounter: Secondary | ICD-10-CM | POA: Diagnosis not present

## 2020-03-09 DIAGNOSIS — I10 Essential (primary) hypertension: Secondary | ICD-10-CM

## 2020-03-09 DIAGNOSIS — G20 Parkinson's disease: Secondary | ICD-10-CM

## 2020-03-09 NOTE — Progress Notes (Signed)
Location:    [facility] Room Number: 215/D Place of Service:  SNF [phone]) Provider:  Leda Roys, DO  Patient Care Team: Gayland Curry, DO as PCP - General (Geriatric Medicine) Rehab, [place] ([place])  Extended Emergency Contact Information Primary Emergency Contact: Orpah Melter Address: [place], [place] [address] [name] of [place] Phone: [phone] Mobile Phone: [phone] Relation: Son  Code Status:  DNR Goals of care: Advanced Directive information Advanced Directives 03/09/2020  Does Patient Have a Medical Advance Directive? Yes  Type of Paramedic of [place];Out of facility DNR (pink MOST or yellow form)  Does patient want to make changes to medical advance directive? No - Patient declined  Copy of [place] in Chart? Yes - validated most recent copy scanned in chart (See row information)  Would patient like information on creating a medical advance directive? -  Pre-existing out of facility DNR order (yellow form or pink MOST form) Yellow form placed in chart (order not valid for inpatient use)     Chief Complaint  Patient presents with   Follow-up    ER Visit   Status post nose laceration after fall   HPI:  Pt is a 86 y.o. male seen today for an acute visit for follow-up of an ER visit last night.  Patient apparently fell in the facility and was found on the floor on his right side.  He was noted to have a laceration to the bridge of his nose and was sent to the ER for evaluation.  ER work-up did not really show any acute process CT scan of the head and cervical spine did not show any acute process.  He has returned to the facility-and appears to be at baseline.  He does have a history of Parkinson's disease and is largely nonverbal this morning but that is not new.  He appears to be resting in  bed comfortably.  Vital signs appear to be stable nursing does not report any other issues at this time.     Past Medical History:  Diagnosis Date   Alzheimer's dementia ([place])    Hypertension    Parkinson disease ([place])    Prostate cancer ([place])    Renal disorder    Syncope    Past Surgical History:  Procedure Laterality Date   BACK SURGERY     IR VERTEBROPLASTY CERV/THOR BX INC UNI/BIL INC/INJECT/IMAGING  01/31/2018   IR VERTEBROPLASTY CERV/THOR BX INC UNI/BIL INC/INJECT/IMAGING  03/24/2019    No Known Allergies  Outpatient Encounter Medications as of 03/09/2020  Medication Sig   amLODipine (NORVASC) 2.5 MG tablet Take 1 tablet (2.5 mg total) by mouth daily.   ascorbic acid (VITAMIN C) 500 MG tablet Take 500 mg by mouth daily.   aspirin 81 MG chewable tablet Chew 1 tablet (81 mg total) by mouth daily.   carbidopa-levodopa (SINEMET IR) 25-250 MG tablet Take 1 tablet by mouth 3 (three) times daily. 9am, 1pm, 7pm   Carboxymethylcellulose Sodium (ARTIFICIAL TEARS OP) Apply to eye. 1 drop into both eye as needed 4 times daily   Cholecalciferol (D3-50) 1.25 MG (50000 UT) capsule Take 50,000 Units by mouth every 30 (thirty) days.   cloNIDine (CATAPRES) 0.2 MG tablet Take 0.2 mg by mouth 3 (three) times daily. 9am, 1pm, 7pm   feeding supplement, ENSURE ENLIVE, (  ENSURE ENLIVE) LIQD Take 237 mLs by mouth 2 (two) times daily between meals.   guaiFENesin (MUCINEX) 600 MG 12 hr tablet Take 600 mg by mouth 2 (two) times daily.   Infant Care Products (BABY SHAMPOO EX) Apply topically. Baby shampo ( less than a dime size ) with warm water can be use for eye hygiene   Multiple Vitamin (MULTIVITAMIN WITH MINERALS) TABS tablet Take 1 tablet by mouth daily.   NON FORMULARY Diet Order: Pureed Diet, no therapeutic restrictions   omeprazole (PRILOSEC) 40 MG capsule Take 40 mg by mouth daily.   polyethylene glycol (MIRALAX / GLYCOLAX) 17 g packet Take 17 g by mouth daily as needed  for mild constipation.   QUEtiapine (SEROQUEL) 50 MG tablet Take 25 mg by mouth daily at 6 PM.    thiamine (VITAMIN B-1) 100 MG tablet Take 100 mg by mouth daily. Take at 1 PM   No facility-administered encounter medications on file as of 03/09/2020.    Review of Systems   Is unobtainable secondary to patient's dementia-Parkinson's disease-nursing does not report any issues or evidence of increased pain  Immunization History  Administered Date(s) Administered   Fluad Quad(high Dose 65+) 05/23/2019   Influenza, High Dose Seasonal PF 06/26/2017, 06/17/2018   Influenza-Unspecified 06/22/2016, 06/26/2017   Moderna SARS-COVID-2 Vaccination 10/08/2019, 11/05/2019   Pneumococcal Polysaccharide-23 01/07/2015, 05/11/2017   Zoster Recombinat (Shingrix) 12/11/2017, 03/18/2018   Pertinent  Health Maintenance Due  Topic Date Due   PNA vac Low Risk Adult (2 of 2 - PCV13) 05/11/2018   INFLUENZA VACCINE  04/10/2020   Fall Risk  01/27/2020  Risk for fall due to : History of fall(s);Impaired balance/gait;Impaired mobility;Medication side effect;Impaired vision;Mental status change  Follow up Falls evaluation completed;Education provided;Falls prevention discussed   Functional Status Survey:    Vitals:   03/09/20 1051  BP: (!) 138/58  Pulse: 60  Resp: 17  Temp: (!) 97.4 F (36.3 C)  TempSrc: Oral  SpO2: 97%  Weight: 130 lb 9.6 oz (59.2 kg)  Height: 6\' 1"  (1.854 m)   Body mass index is 17.23 kg/m. Physical Exam In general this is a frail elderly male in no distress he appears to be resting in bed comfortably.  He is not really speaking today but does respond to stimuli-is not really following verbal commands very well but this is not a new behavior he does this intermittently  Skin is warm and dry he does have evidence of a small laceration to the bridge of his nose I do not see any surrounding erythema or edema or concerns for cellulitis.  Eyes visual acuity appears to be  intact he did not open his eyes really on command- when he did open them appear to be clear  Oropharynx was difficult to assess since patient did not really open his mouth to verbal commands from what I could assess when he did open his mouth appeared to be clear.  Chest was clear to auscultation with poor respiratory effort there is no labored breathing.  Heart distant heart sounds sounded regular rate and rhythm with occasional irregular beats he does not have significant lower extremity edema.  Abdomen was soft did not appear to be tender did have positive bowel sounds.  Musculoskeletal has generalized rigidity but appears to move his upper extremities at baseline with stiffness of his lower extremities bilaterally which is not new.  Neurologic as noted above he is alert but does not really follow verbal commands well does not really open  his eyes on command but will open his mouth and eyes at times especially when stimulated.  Psych findings consistent with dementia-Parkinson's disease-he is not agitated-he is alert but not really verbalizing.   Labs reviewed: Recent Labs    04/22/19 0441 04/22/19 0441 04/23/19 0428 05/20/19 1140 05/20/19 1900 05/22/19 0110 09/01/19 1645 09/01/19 1645 09/02/19 0422 09/02/19 0422 09/03/19 0523 09/29/19 0000 01/28/20 0000 02/04/20 0000 02/25/20 0000  NA 142   < > 143   < >  --    < > 137   < > 138   < > 137   < > 138 139 138  K 3.0*   < > 4.0   < >  --    < > 4.2   < > 4.6   < > 4.4   < > 5.2 4.4 4.7  CL 104   < > 110   < >  --    < > 103   < > 107   < > 104   < > 108 107 110*  CO2 29   < > 26   < >  --    < > 25   < > 21*   < > 24   < > 19 19 20   GLUCOSE 104*   < > 102*   < >  --    < > 108*  --  101*  --  92  --   --   --   --   BUN 28*   < > 28*   < >  --    < > 39*   < > 36*   < > 33*   < > 50* 37* 40*  CREATININE 1.84*   < > 1.73*   < >  --    < > 1.61*   < > 1.54*   < > 1.23   < > 2.0* 1.7* 1.5*  CALCIUM 7.9*   < > 7.9*   < >  --     < > 9.0   < > 8.9   < > 9.3   < > 9.3 9.6 9.7  MG 1.7  --  1.7  --  1.8  --   --   --   --   --   --   --   --   --   --    < > = values in this interval not displayed.   Recent Labs    03/30/19 0432 03/30/19 0432 04/20/19 1701 05/20/19 1140 11/25/19 0000  AST 16   < > 35 10* 16  ALT 5   < > 17 5 8*  ALKPHOS 68   < > 80 56 61  BILITOT 0.5  --  0.5 0.3  --   PROT 5.5*  --  6.3* 5.6*  --   ALBUMIN 3.2*   < > 3.3* 2.9* 3.7   < > = values in this interval not displayed.   Recent Labs    06/01/19 1957 06/26/19 0000 09/01/19 1645 09/01/19 1645 09/02/19 0422 09/02/19 0422 09/03/19 0523 09/29/19 0000 12/31/19 0000 01/07/20 0000 02/25/20 0000  WBC   < > 7.1 6.1   < > 7.9   < > 7.5   < > 5.9 7.1 5.8  NEUTROABS  --  5 4.0  --   --   --  4.8  --   --   --   --   HGB   < >  10.1* 6.8*   < > 8.1*   < > 9.6*   < > 8.6* 8.3* 8.9*  HCT   < > 30* 23.0*   < > 27.1*   < > 31.0*   < > 26* 25* 28*  MCV   < >  --  89.8  --  92.8  --  91.4  --   --   --   --   PLT   < > 136* 247   < > 209   < > 211   < > 165 176 151   < > = values in this interval not displayed.   Lab Results  Component Value Date   TSH 2.21 11/25/2019   Lab Results  Component Value Date   HGBA1C 5.7 02/25/2020   Lab Results  Component Value Date   CHOL 99 11/25/2019   HDL 50 11/25/2019   LDLCALC 42 11/25/2019   TRIG 36 (A) 11/25/2019   CHOLHDL 3.3 03/30/2019    Significant Diagnostic Results in last 30 days:  CT Head Wo Contrast  Result Date: 03/08/2020 CLINICAL DATA:  Dementia patient post fall this evening. Laceration to bridge of nose. EXAM: CT HEAD WITHOUT CONTRAST TECHNIQUE: Contiguous axial images were obtained from the base of the skull through the vertex without intravenous contrast. COMPARISON:  Head CT 06/01/2019 FINDINGS: Brain: No acute intracranial hemorrhage. Generalized atrophy with questionable progression from prior. Moderate to advanced chronic small vessel ischemia. Remote lacunar infarcts in  the bilateral basal ganglia, bilateral thalami and right caudate. No subdural or extra-axial collection. No midline shift or hydrocephalus. No evidence of acute ischemia. Vascular: Atherosclerosis of skullbase vasculature without hyperdense vessel or abnormal calcification. Skull: No fracture or focal lesion. Sinuses/Orbits: Paranasal sinuses and mastoid air cells are clear. The visualized orbits are unremarkable. No evidence of nasal bone fracture. Remote right maxillary sinus fracture. Bilateral cataract resection. Other: None. IMPRESSION: 1. No acute intracranial abnormality. No skull fracture. 2. Generalized atrophy and chronic small vessel ischemia. Multiple remote lacunar infarcts. Electronically Signed   By: Keith Rake M.D.   On: 03/08/2020 22:49   CT Cervical Spine Wo Contrast  Result Date: 03/08/2020 CLINICAL DATA:  Neck trauma fall EXAM: CT CERVICAL SPINE WITHOUT CONTRAST TECHNIQUE: Multidetector CT imaging of the cervical spine was performed without intravenous contrast. Multiplanar CT image reconstructions were also generated. COMPARISON:  CT angiography 03/29/2019 FINDINGS: Alignment: Exaggerated lordosis. Trace anterolisthesis C6 on C7 and C7 on T1 without change. Facet alignment is maintained. Skull base and vertebrae: Chronic compression deformities at T1 and T2. No acute fracture is seen. Soft tissues and spinal canal: No prevertebral fluid or swelling. No visible canal hematoma. Disc levels: Moderate advanced degenerative changes throughout the cervical spine. Interbody and facet ankylosis at C4-C5. Upper chest: Emphysema Other: None IMPRESSION: 1. Chronic compression deformities at T1 and T2. No acute fracture identified. 2. Moderate 2 advanced degenerative changes throughout the cervical spine. Electronically Signed   By: Donavan Foil M.D.   On: 03/08/2020 22:51    Assessment/Plan  #1 fall with nasal laceration-again work-up of the ER did not show anything acutely concerning-CT of  the head and cervical spine did not show any acute abnormality-he did receive sutures which should be self dissolving.  I do not see any evidence of cellulitis at this point-at this point continue to monitor he appears to be at baseline.  2.  History of Parkinson's disease-as noted above this appears to be quite advanced-he does have Seroquel 25  mg nightly-this was recently reduced because of his fall risk.  He also continues on Sinemet 25-250 mg 3 times a day.  3.  History of stage III chronic kidney disease recent lab on June 17 actually showed improvement with a creatinine of 1.5 at one-point he had been above 2-at this point continue supportive care and encourage fluids.  4.  History of chronic anemia this appears stable as well with a hemoglobin of 8.9 on the February 25, 2020 lab.  5.  History of hypertension he is on clonidine 0.2 mg 3 times a day as well as Norvasc 2.5 mg a day-recent blood pressures appear to be stable in the 719L systolically --apparently does have higher readings at times before he receives his meds in the morning which appears to moderate after receiving his meds.  [id number] note greater than 25 minutes spent assessing patient-reviewing his records including ER notes-reviewing his chart and labs-and coordinating a plan of care-of note greater than 50% of time spent coordinating plan of care with input as noted above

## 2020-03-09 NOTE — ED Notes (Signed)
Report given to [facility] 

## 2020-03-09 NOTE — ED Notes (Signed)
Report called to [facility]; spoke with Joelene Millin; advised of patient's status and reviewed care and discharge instructions with her. Kim verbalized understanding. Discharge paperwork and DNR form sent with [facility].

## 2020-03-15 DIAGNOSIS — H353211 Exudative age-related macular degeneration, right eye, with active choroidal neovascularization: Secondary | ICD-10-CM | POA: Diagnosis not present

## 2020-03-15 DIAGNOSIS — H353231 Exudative age-related macular degeneration, bilateral, with active choroidal neovascularization: Secondary | ICD-10-CM | POA: Diagnosis not present

## 2020-03-21 ENCOUNTER — Other Ambulatory Visit: Payer: Self-pay | Admitting: Family

## 2020-03-21 MED ORDER — GUAIFENESIN-DM 100-10 MG/5ML PO SYRP: 5.0000 mL | ORAL_SOLUTION | Freq: Three times a day (TID) | ORAL | 1 refills | Status: DC | PRN

## 2020-03-28 ENCOUNTER — Encounter: Payer: Self-pay | Admitting: Family

## 2020-03-28 ENCOUNTER — Non-Acute Institutional Stay (SKILLED_NURSING_FACILITY): Payer: Medicare Other | Admitting: Family

## 2020-03-28 DIAGNOSIS — G20 Parkinson's disease: Secondary | ICD-10-CM | POA: Diagnosis not present

## 2020-03-28 DIAGNOSIS — I10 Essential (primary) hypertension: Secondary | ICD-10-CM | POA: Diagnosis not present

## 2020-03-28 DIAGNOSIS — E559 Vitamin D deficiency, unspecified: Secondary | ICD-10-CM | POA: Diagnosis not present

## 2020-03-28 DIAGNOSIS — F0281 Dementia in other diseases classified elsewhere with behavioral disturbance: Secondary | ICD-10-CM

## 2020-03-28 DIAGNOSIS — R269 Unspecified abnormalities of gait and mobility: Secondary | ICD-10-CM | POA: Diagnosis not present

## 2020-03-28 NOTE — Progress Notes (Signed)
Location:    [place] Room Number: 215/D Place of Service:  SNF (31) Provider:  Marlowe Sax NP  Gayland Curry, DO  Patient Care Team: Gayland Curry, DO as PCP - General (Geriatric Medicine) Rehab, [place] ([place])  Extended Emergency Contact Information Primary Emergency Contact: Orpah Melter Address: [place], [place] [address] [name] of [place] Phone: [phone] Mobile Phone: [phone] Relation: Son  Code Status:  DNR Goals of care: Advanced Directive information Advanced Directives 03/28/2020  Does Patient Have a Medical Advance Directive? Yes  Type of Paramedic of [place];Out of facility DNR (pink MOST or yellow form)  Does patient want to make changes to medical advance directive? No - Patient declined  Copy of [place] in Chart? Yes - validated most recent copy scanned in chart (See row information)  Would patient like information on creating a medical advance directive? -  Pre-existing out of facility DNR order (yellow form or pink MOST form) Yellow form placed in chart (order not valid for inpatient use)     Chief Complaint  Patient presents with   Medical Management of Chronic Issues    Routine visit of medical management   Immunizations    PCV-13    HPI:  Pt is a 86 y.o. male seen today for routine visit for medical management of chronic diseases.He has medical history of hypertension,Parkinson disease,Alzheimer's Dementia Prostate Cancer,syncope among other conditions.He is seen in his room today up in the bed.He answer yes or no question.He denies any acute issues.Facility Nurse reports no new concerns.He is status post fall episode 03/09/2020 with laceration to the bridge of his nose.He was seen in ED     Past Medical History:  Diagnosis Date   Alzheimer's dementia ([place])    Hypertension     Parkinson disease ([place])    Prostate cancer ([place])    Renal disorder    Syncope    Past Surgical History:  Procedure Laterality Date   BACK SURGERY     IR VERTEBROPLASTY CERV/THOR BX INC UNI/BIL INC/INJECT/IMAGING  01/31/2018   IR VERTEBROPLASTY CERV/THOR BX INC UNI/BIL INC/INJECT/IMAGING  03/24/2019    No Known Allergies  Allergies as of 03/28/2020   No Known Allergies     Medication List       Accurate as of March 28, 2020  3:27 PM. If you have any questions, ask your nurse or doctor.        STOP taking these medications   ARTIFICIAL TEARS OP Stopped by: Sandrea Hughs, NP     TAKE these medications   amLODipine 2.5 MG tablet Commonly known as: NORVASC Take 1 tablet (2.5 mg total) by mouth daily.   ascorbic acid 500 MG tablet Commonly known as: VITAMIN C Take 500 mg by mouth daily.   aspirin 81 MG chewable tablet Chew 1 tablet (81 mg total) by mouth daily.   BABY SHAMPOO EX Apply topically. Baby shampo ( less than a dime size ) with warm water can be use for eye hygiene   carbidopa-levodopa 25-250 MG tablet Commonly known as: SINEMET IR Take 1 tablet by mouth 3 (three) times daily. 9am, 1pm, 7pm   cloNIDine 0.2 MG tablet Commonly known as: CATAPRES Take 0.2 mg by mouth 3 (three) times daily. 9am, 1pm, 7pm   D3-50 1.25 MG (50000 UT) capsule Generic  drug: Cholecalciferol Take 50,000 Units by mouth every 30 (thirty) days.   feeding supplement (ENSURE ENLIVE) Liqd Take 237 mLs by mouth 2 (two) times daily between meals.   multivitamin with minerals Tabs tablet Take 1 tablet by mouth daily.   NON FORMULARY Diet Order: Pureed Diet, no therapeutic restrictions   OcuSoft Lid Scrub Original Pads Apply topically. Use Ocusoft for lid scrubs twice a day starting on 03/20/20 including eyebrows and forehead for 2 weeks   omeprazole 40 MG capsule Commonly known as: PRILOSEC Take 40 mg by mouth daily.   ondansetron 4 MG tablet Commonly known as:  ZOFRAN Take 4 mg by mouth every 6 (six) hours as needed for nausea or vomiting.   polyethylene glycol 17 g packet Commonly known as: MIRALAX / GLYCOLAX Take 17 g by mouth daily as needed for mild constipation.   QUEtiapine 50 MG tablet Commonly known as: SEROQUEL Take 25 mg by mouth at bedtime.   ROBITUSSIN COUGH+CHEST CONG DM PO Take 20 mLs by mouth 2 (two) times daily as needed. What changed: Another medication with the same name was removed. Continue taking this medication, and follow the directions you see here. Changed by: Sandrea Hughs, NP   thiamine 100 MG tablet Commonly known as: Vitamin B-1 Take 100 mg by mouth daily. Take at 1 PM       Review of Systems  Unable to perform ROS: Dementia (Additional information obtain from facility Nurse )    Immunization History  Administered Date(s) Administered   Fluad Quad(high Dose 65+) 05/23/2019   Influenza, High Dose Seasonal PF 06/26/2017, 06/17/2018   Influenza-Unspecified 06/22/2016, 06/26/2017   Moderna SARS-COVID-2 Vaccination 10/08/2019, 11/05/2019   Pneumococcal Polysaccharide-23 01/07/2015, 05/11/2017   Zoster Recombinat (Shingrix) 12/11/2017, 03/18/2018   Pertinent  Health Maintenance Due  Topic Date Due   PNA vac Low Risk Adult (2 of 2 - PCV13) 05/11/2018   INFLUENZA VACCINE  04/10/2020   Fall Risk  01/27/2020  Risk for fall due to : History of fall(s);Impaired balance/gait;Impaired mobility;Medication side effect;Impaired vision;Mental status change  Follow up Falls evaluation completed;Education provided;Falls prevention discussed    Vitals:   03/28/20 1526  BP: 126/68  Pulse: 71  Resp: 18  Temp: (!) 97.4 F (36.3 C)  TempSrc: Oral  SpO2: 97%  Weight: 131 lb 9.6 oz (59.7 kg)  Height: 6\' 1"  (1.854 m)   Body mass index is 17.36 kg/m. Physical Exam Vitals reviewed.  Constitutional:      General: He is not in acute distress.    Appearance: He is not ill-appearing.  HENT:     Head:  Normocephalic.     Nose: Nose normal. No congestion or rhinorrhea.     Mouth/Throat:     Mouth: Mucous membranes are moist.     Pharynx: Oropharynx is clear. No oropharyngeal exudate or posterior oropharyngeal erythema.  Eyes:     General: No scleral icterus.       Right eye: No discharge.        Left eye: No discharge.     Conjunctiva/sclera: Conjunctivae normal.     Pupils: Pupils are equal, round, and reactive to light.  Cardiovascular:     Rate and Rhythm: Normal rate and regular rhythm.     Pulses: Normal pulses.     Heart sounds: Normal heart sounds. No murmur heard.  No friction rub. No gallop.   Pulmonary:     Effort: Pulmonary effort is normal. No respiratory distress.     Breath  sounds: Normal breath sounds. No wheezing, rhonchi or rales.  Chest:     Chest wall: No tenderness.  Abdominal:     General: Bowel sounds are normal. There is no distension.     Palpations: Abdomen is soft. There is no mass.     Tenderness: There is no abdominal tenderness. There is no right CVA tenderness, left CVA tenderness, guarding or rebound.  Musculoskeletal:        General: No swelling or tenderness.     Right lower leg: No edema.     Left lower leg: No edema.     Comments: Moves x 4 extremities except rigidity and cog wheeling of bilateral upper extremities   Skin:    General: Skin is warm and dry.     Coloration: Skin is not pale.     Findings: No bruising, erythema or rash.  Neurological:     Mental Status: He is alert.     Motor: No weakness.     Gait: Gait abnormal.     Comments: Alert and oriented to self and familiar staff.   Psychiatric:        Mood and Affect: Mood normal.        Behavior: Behavior is cooperative.        Cognition and Memory: Memory is impaired.     Comments: Answer yes or no to questions    Labs reviewed: Recent Labs    04/22/19 0441 04/22/19 0441 04/23/19 0428 05/20/19 1140 05/20/19 1900 05/22/19 0110 09/01/19 1645 09/01/19 1645  09/02/19 0422 09/02/19 0422 09/03/19 0523 09/29/19 0000 01/28/20 0000 02/04/20 0000 02/25/20 0000  NA 142   < > 143   < >  --    < > 137   < > 138   < > 137   < > 138 139 138  K 3.0*   < > 4.0   < >  --    < > 4.2   < > 4.6   < > 4.4   < > 5.2 4.4 4.7  CL 104   < > 110   < >  --    < > 103   < > 107   < > 104   < > 108 107 110*  CO2 29   < > 26   < >  --    < > 25   < > 21*   < > 24   < > 19 19 20   GLUCOSE 104*   < > 102*   < >  --    < > 108*  --  101*  --  92  --   --   --   --   BUN 28*   < > 28*   < >  --    < > 39*   < > 36*   < > 33*   < > 50* 37* 40*  CREATININE 1.84*   < > 1.73*   < >  --    < > 1.61*   < > 1.54*   < > 1.23   < > 2.0* 1.7* 1.5*  CALCIUM 7.9*   < > 7.9*   < >  --    < > 9.0   < > 8.9   < > 9.3   < > 9.3 9.6 9.7  MG 1.7  --  1.7  --  1.8  --   --   --   --   --   --   --   --   --   --    < > =  values in this interval not displayed.   Recent Labs    03/30/19 0432 03/30/19 0432 04/20/19 1701 05/20/19 1140 11/25/19 0000  AST 16   < > 35 10* 16  ALT 5   < > 17 5 8*  ALKPHOS 68   < > 80 56 61  BILITOT 0.5  --  0.5 0.3  --   PROT 5.5*  --  6.3* 5.6*  --   ALBUMIN 3.2*   < > 3.3* 2.9* 3.7   < > = values in this interval not displayed.   Recent Labs    06/01/19 1957 06/26/19 0000 09/01/19 1645 09/01/19 1645 09/02/19 0422 09/02/19 0422 09/03/19 0523 09/29/19 0000 12/31/19 0000 01/07/20 0000 02/25/20 0000  WBC   < > 7.1 6.1   < > 7.9   < > 7.5   < > 5.9 7.1 5.8  NEUTROABS  --  5 4.0  --   --   --  4.8  --   --   --   --   HGB   < > 10.1* 6.8*   < > 8.1*   < > 9.6*   < > 8.6* 8.3* 8.9*  HCT   < > 30* 23.0*   < > 27.1*   < > 31.0*   < > 26* 25* 28*  MCV   < >  --  89.8  --  92.8  --  91.4  --   --   --   --   PLT   < > 136* 247   < > 209   < > 211   < > 165 176 151   < > = values in this interval not displayed.   Lab Results  Component Value Date   TSH 2.21 11/25/2019   Lab Results  Component Value Date   HGBA1C 5.7 02/25/2020   Lab Results   Component Value Date   CHOL 99 11/25/2019   HDL 50 11/25/2019   LDLCALC 42 11/25/2019   TRIG 36 (A) 11/25/2019   CHOLHDL 3.3 03/30/2019    Significant Diagnostic Results in last 30 days:  CT Head Wo Contrast  Result Date: 03/08/2020 CLINICAL DATA:  Dementia patient post fall this evening. Laceration to bridge of nose. EXAM: CT HEAD WITHOUT CONTRAST TECHNIQUE: Contiguous axial images were obtained from the base of the skull through the vertex without intravenous contrast. COMPARISON:  Head CT 06/01/2019 FINDINGS: Brain: No acute intracranial hemorrhage. Generalized atrophy with questionable progression from prior. Moderate to advanced chronic small vessel ischemia. Remote lacunar infarcts in the bilateral basal ganglia, bilateral thalami and right caudate. No subdural or extra-axial collection. No midline shift or hydrocephalus. No evidence of acute ischemia. Vascular: Atherosclerosis of skullbase vasculature without hyperdense vessel or abnormal calcification. Skull: No fracture or focal lesion. Sinuses/Orbits: Paranasal sinuses and mastoid air cells are clear. The visualized orbits are unremarkable. No evidence of nasal bone fracture. Remote right maxillary sinus fracture. Bilateral cataract resection. Other: None. IMPRESSION: 1. No acute intracranial abnormality. No skull fracture. 2. Generalized atrophy and chronic small vessel ischemia. Multiple remote lacunar infarcts. Electronically Signed   By: Keith Rake M.D.   On: 03/08/2020 22:49   CT Cervical Spine Wo Contrast  Result Date: 03/08/2020 CLINICAL DATA:  Neck trauma fall EXAM: CT CERVICAL SPINE WITHOUT CONTRAST TECHNIQUE: Multidetector CT imaging of the cervical spine was performed without intravenous contrast. Multiplanar CT image reconstructions were also generated. COMPARISON:  CT angiography 03/29/2019 FINDINGS: Alignment: Exaggerated lordosis. Trace  anterolisthesis C6 on C7 and C7 on T1 without change. Facet alignment is  maintained. Skull base and vertebrae: Chronic compression deformities at T1 and T2. No acute fracture is seen. Soft tissues and spinal canal: No prevertebral fluid or swelling. No visible canal hematoma. Disc levels: Moderate advanced degenerative changes throughout the cervical spine. Interbody and facet ankylosis at C4-C5. Upper chest: Emphysema Other: None IMPRESSION: 1. Chronic compression deformities at T1 and T2. No acute fracture identified. 2. Moderate 2 advanced degenerative changes throughout the cervical spine. Electronically Signed   By: Donavan Foil M.D.   On: 03/08/2020 22:51    Assessment/Plan 1. Essential hypertension B/p at goal.continue on Amlodipine 2.5 mg tablet daily and clonidine 0.2 mg tablet three times daily.On ASA for Cardiovascular event prevention.   2. Parkinson disease ([place]) Progressive decline expected.bilateral upper extremities rigidity and cog wheeling and Non -verbal except answers yes or no to questions during visit.continue /sinemet IR. Continue to follow up with Neurology.  3. Dementia due to Parkinson's disease with behavioral disturbance ([place]) No new behavioral issues reported by Nurse. Continue on low dose Seroquel.  4. Gait abnormality Requires assistance with transfer.Status post fall episode 03/09/2020.No recent fall episode. - continue fall and safety precaution.   5. Vitamin D deficiency Continue on vit D supplement.   Family/ staff Communication: Reviewed plan of care with patient and facility Nurse   Labs/tests ordered:  None

## 2020-04-08 DIAGNOSIS — M6281 Muscle weakness (generalized): Secondary | ICD-10-CM | POA: Diagnosis not present

## 2020-04-08 DIAGNOSIS — R2681 Unsteadiness on feet: Secondary | ICD-10-CM | POA: Diagnosis not present

## 2020-04-08 DIAGNOSIS — D509 Iron deficiency anemia, unspecified: Secondary | ICD-10-CM | POA: Diagnosis not present

## 2020-04-11 ENCOUNTER — Encounter: Payer: Self-pay | Admitting: Family

## 2020-04-11 ENCOUNTER — Non-Acute Institutional Stay (SKILLED_NURSING_FACILITY): Payer: Medicare Other | Admitting: Family

## 2020-04-11 DIAGNOSIS — F0281 Dementia in other diseases classified elsewhere with behavioral disturbance: Secondary | ICD-10-CM

## 2020-04-11 DIAGNOSIS — F02818 Dementia in other diseases classified elsewhere, unspecified severity, with other behavioral disturbance: Secondary | ICD-10-CM

## 2020-04-11 DIAGNOSIS — R2681 Unsteadiness on feet: Secondary | ICD-10-CM | POA: Diagnosis not present

## 2020-04-11 DIAGNOSIS — D509 Iron deficiency anemia, unspecified: Secondary | ICD-10-CM | POA: Diagnosis not present

## 2020-04-11 DIAGNOSIS — G20 Parkinson's disease: Secondary | ICD-10-CM

## 2020-04-11 DIAGNOSIS — M6281 Muscle weakness (generalized): Secondary | ICD-10-CM | POA: Diagnosis not present

## 2020-04-11 NOTE — Progress Notes (Signed)
Location:    [facility].   [facility] Room Number: 215-D Place of Service:  SNF (31) Provider:  Maybelle Depaoli., Aragorn Recker, NP    Patient Care Team: Gayland Curry, DO as PCP - General (Geriatric Medicine) Rehab, [place] ([facility])  Extended Emergency Contact Information Primary Emergency Contact: Orpah Melter Address: [place], [place] [address] [name] of [place] Phone: [phone] Mobile Phone: [phone] Relation: Son  Code Status:  DNR Goals of care: Advanced Directive information Advanced Directives 04/11/2020  Does Patient Have a Medical Advance Directive? Yes  Type of Paramedic of [place];Out of facility DNR (pink MOST or yellow form)  Does patient want to make changes to medical advance directive? No - Patient declined  Copy of [place] in Chart? Yes - validated most recent copy scanned in chart (See row information)  Would patient like information on creating a medical advance directive? -  Pre-existing out of facility DNR order (yellow form or pink MOST form) Yellow form placed in chart (order not valid for inpatient use)     Chief Complaint  Patient presents with  . Acute Visit    Medical Management     HPI:  Pt is a 86 y.o. male seen today for an acute visit for medical management.He seen in his room sitting on wheelchair.He has a medical history of Hypertension,Parkinson disease,Alzheimer's dementia,Prostate cancer among other condition.He denies any acute issue today.Per facility DON patient currently on Seroquel for dementia  related behaviors.No new behavioral issues.on chart review noted by Dr.Reed 01/19/2020  patient had hallucination/pyschosis on Seroquel due to effects on his quality of life. He continues to requires assistance with his ADL's.will attempt gradual dose reduction of Seroquel.   Past Medical History:  Diagnosis Date  .  Alzheimer's dementia ([place])   . Hypertension   . Parkinson disease ([place])   . Prostate cancer ([place])   . Renal disorder   . Syncope    Past Surgical History:  Procedure Laterality Date  . BACK SURGERY    . IR VERTEBROPLASTY CERV/THOR BX INC UNI/BIL INC/INJECT/IMAGING  01/31/2018  . IR VERTEBROPLASTY CERV/THOR BX INC UNI/BIL INC/INJECT/IMAGING  03/24/2019    No Known Allergies  Allergies as of 04/11/2020   No Known Allergies     Medication List       Accurate as of April 11, 2020 11:13 AM. If you have any questions, ask your nurse or doctor.        amLODipine 2.5 MG tablet Commonly known as: NORVASC Take 1 tablet (2.5 mg total) by mouth daily.   ascorbic acid 500 MG tablet Commonly known as: VITAMIN C Take 500 mg by mouth daily.   aspirin 81 MG chewable tablet Chew 1 tablet (81 mg total) by mouth daily.   BABY SHAMPOO EX Apply topically. Baby shampo ( less than a dime size ) with warm water can be use for eye hygiene   carbidopa-levodopa 25-250 MG tablet Commonly known as: SINEMET IR Take 1 tablet by mouth 3 (three) times daily. 9am, 1pm, 7pm   cloNIDine 0.2 MG tablet Commonly known as: CATAPRES Take 0.2 mg by mouth 3 (three) times daily. 9am, 1pm, 7pm   D3-50 1.25 MG (50000 UT) capsule Generic drug: Cholecalciferol Take 50,000 Units by mouth every 30 (thirty) days.   feeding supplement (ENSURE ENLIVE) Liqd Take 237 mLs by mouth 2 (  two) times daily between meals.   multivitamin with minerals Tabs tablet Take 1 tablet by mouth daily.   NON FORMULARY Diet Order: Pureed Diet, no therapeutic restrictions   omeprazole 40 MG capsule Commonly known as: PRILOSEC Take 40 mg by mouth daily.   ondansetron 4 MG tablet Commonly known as: ZOFRAN Take 4 mg by mouth every 6 (six) hours as needed for nausea or vomiting.   polyethylene glycol 17 g packet Commonly known as: MIRALAX / GLYCOLAX Take 17 g by mouth daily as needed for mild constipation.   QUEtiapine 50 MG  tablet Commonly known as: SEROQUEL Take 25 mg by mouth at bedtime.   ROBITUSSIN COUGH+CHEST CONG DM PO Take 20 mLs by mouth 2 (two) times daily as needed.   thiamine 100 MG tablet Commonly known as: Vitamin B-1 Take 100 mg by mouth daily. Take at 1 PM       Review of Systems  Constitutional: Negative for appetite change, chills, fatigue and fever.  HENT: Negative for congestion, rhinorrhea, sinus pressure, sinus pain, sneezing and sore throat.   Eyes: Negative for discharge, redness and itching.  Respiratory: Negative for cough, chest tightness, shortness of breath and wheezing.   Cardiovascular: Negative for chest pain, palpitations and leg swelling.  Gastrointestinal: Negative for abdominal distention, abdominal pain, constipation, diarrhea, nausea and vomiting.  Endocrine: Negative for cold intolerance, heat intolerance, polydipsia, polyphagia and polyuria.  Genitourinary:       Incontinent   Musculoskeletal: Positive for gait problem. Negative for joint swelling, myalgias and neck pain.  Skin: Negative for color change, pallor and rash.  Neurological: Negative for dizziness, speech difficulty, light-headedness and headaches.  Hematological: Does not bruise/bleed easily.  Psychiatric/Behavioral: Negative for agitation, behavioral problems, hallucinations and sleep disturbance. The patient is not nervous/anxious.        Alert but thinks he is at [facility] and the date today is Sunday instead of Monday.     Immunization History  Administered Date(s) Administered  . Fluad Quad(high Dose 65+) 05/23/2019  . Influenza, High Dose Seasonal PF 06/26/2017, 06/17/2018  . Influenza-Unspecified 06/22/2016, 06/26/2017  . Moderna SARS-COVID-2 Vaccination 10/08/2019, 11/05/2019  . Pneumococcal Polysaccharide-23 01/07/2015, 05/11/2017  . Zoster Recombinat (Shingrix) 12/11/2017, 03/18/2018   Pertinent  Health Maintenance Due  Topic Date Due  . PNA vac Low Risk Adult (2 of 2 - PCV13)  05/11/2018  . INFLUENZA VACCINE  04/10/2020   Fall Risk  01/27/2020  Risk for fall due to : History of fall(s);Impaired balance/gait;Impaired mobility;Medication side effect;Impaired vision;Mental status change  Follow up Falls evaluation completed;Education provided;Falls prevention discussed    Vitals:   04/11/20 1108  BP: (!) 108/62  Pulse: 79  Resp: 16  Temp: 98.3 F (36.8 C)  SpO2: 97%  Weight: 131 lb 9.6 oz (59.7 kg)  Height: 6\' 1"  (1.854 m)   Body mass index is 17.36 kg/m. Physical Exam Vitals reviewed.  Constitutional:      General: He is not in acute distress.    Appearance: He is underweight. He is not ill-appearing.  HENT:     Head: Normocephalic.     Nose: Nose normal. No congestion or rhinorrhea.     Mouth/Throat:     Mouth: Mucous membranes are moist.     Pharynx: Oropharynx is clear. No oropharyngeal exudate or posterior oropharyngeal erythema.  Eyes:     General: No scleral icterus.       Right eye: No discharge.        Left eye: No  discharge.     Extraocular Movements: Extraocular movements intact.     Conjunctiva/sclera: Conjunctivae normal.     Pupils: Pupils are equal, round, and reactive to light.  Cardiovascular:     Rate and Rhythm: Normal rate and regular rhythm.     Pulses: Normal pulses.     Heart sounds: Normal heart sounds. No murmur heard.  No friction rub. No gallop.   Pulmonary:     Effort: No respiratory distress.     Breath sounds: Normal breath sounds. No wheezing, rhonchi or rales.  Chest:     Chest wall: No tenderness.  Abdominal:     General: Bowel sounds are normal. There is no distension.     Palpations: Abdomen is soft. There is no mass.     Tenderness: There is no abdominal tenderness. There is no right CVA tenderness, left CVA tenderness, guarding or rebound.  Musculoskeletal:        General: No swelling or tenderness.     Right lower leg: No edema.     Left lower leg: No edema.     Comments: On wheelchair moves x 4  extremities.  Skin:    General: Skin is warm.     Coloration: Skin is not pale.     Findings: No bruising, erythema or rash.  Neurological:     Mental Status: He is alert.     Cranial Nerves: No cranial nerve deficit.     Sensory: Sensory deficit present.     Motor: No weakness.     Gait: Gait abnormal.     Comments: Alert oriented to self and person  Psychiatric:        Mood and Affect: Mood normal.        Behavior: Behavior normal. Behavior is cooperative.        Thought Content: Thought content normal.        Judgment: Judgment normal.     Labs reviewed: Recent Labs    04/22/19 0441 04/22/19 0441 04/23/19 0428 05/20/19 1140 05/20/19 1900 05/22/19 0110 09/01/19 1645 09/01/19 1645 09/02/19 0422 09/02/19 0422 09/03/19 0523 09/29/19 0000 01/28/20 0000 02/04/20 0000 02/25/20 0000  NA 142   < > 143   < >  --    < > 137   < > 138   < > 137   < > 138 139 138  K 3.0*   < > 4.0   < >  --    < > 4.2   < > 4.6   < > 4.4   < > 5.2 4.4 4.7  CL 104   < > 110   < >  --    < > 103   < > 107   < > 104   < > 108 107 110*  CO2 29   < > 26   < >  --    < > 25   < > 21*   < > 24   < > 19 19 20   GLUCOSE 104*   < > 102*   < >  --    < > 108*  --  101*  --  92  --   --   --   --   BUN 28*   < > 28*   < >  --    < > 39*   < > 36*   < > 33*   < > 50* 37* 40*  CREATININE 1.84*   < >  1.73*   < >  --    < > 1.61*   < > 1.54*   < > 1.23   < > 2.0* 1.7* 1.5*  CALCIUM 7.9*   < > 7.9*   < >  --    < > 9.0   < > 8.9   < > 9.3   < > 9.3 9.6 9.7  MG 1.7  --  1.7  --  1.8  --   --   --   --   --   --   --   --   --   --    < > = values in this interval not displayed.   Recent Labs    04/20/19 1701 05/20/19 1140 11/25/19 0000  AST 35 10* 16  ALT 17 5 8*  ALKPHOS 80 56 61  BILITOT 0.5 0.3  --   PROT 6.3* 5.6*  --   ALBUMIN 3.3* 2.9* 3.7   Recent Labs    06/01/19 1957 06/26/19 0000 09/01/19 1645 09/01/19 1645 09/02/19 0422 09/02/19 0422 09/03/19 0523 09/29/19 0000 12/31/19 0000  01/07/20 0000 02/25/20 0000  WBC   < > 7.1 6.1   < > 7.9   < > 7.5   < > 5.9 7.1 5.8  NEUTROABS  --  5 4.0  --   --   --  4.8  --   --   --   --   HGB   < > 10.1* 6.8*   < > 8.1*   < > 9.6*   < > 8.6* 8.3* 8.9*  HCT   < > 30* 23.0*   < > 27.1*   < > 31.0*   < > 26* 25* 28*  MCV   < >  --  89.8  --  92.8  --  91.4  --   --   --   --   PLT   < > 136* 247   < > 209   < > 211   < > 165 176 151   < > = values in this interval not displayed.   Lab Results  Component Value Date   TSH 2.21 11/25/2019   Lab Results  Component Value Date   HGBA1C 5.7 02/25/2020   Lab Results  Component Value Date   CHOL 99 11/25/2019   HDL 50 11/25/2019   LDLCALC 42 11/25/2019   TRIG 36 (A) 11/25/2019   CHOLHDL 3.3 03/30/2019    Significant Diagnostic Results in last 30 days:  No results found.  Assessment/Plan 1. Parkinson disease ([place]) - continue on Sinemet IR 25 -250 mg tablet three times daily  - continue with supportive care  2. Dementia due to Parkinson's disease with behavioral disturbance ([place]) No new behavioral issues hallucinations/pyschosis reported. Will attempt Seroquel Gradual dose reduction. - D/C Seroquel 25 mg tablet daily at bedtime then start Seroquel 12.5 mg tablet one by mouth daily x 1 week then Take Seroquel 12.5 mg tablet every other day x 1 week and stop. - monitor worsening behaviors.will restart Seroquel if hallucination,pyschosis,anxiety,restlessness or agitation      Family/ staff Communication: Review plan with patient and facility Nurse   Labs/tests ordered: As needed if symptoms worsen

## 2020-04-12 DIAGNOSIS — M6281 Muscle weakness (generalized): Secondary | ICD-10-CM | POA: Diagnosis not present

## 2020-04-12 DIAGNOSIS — D509 Iron deficiency anemia, unspecified: Secondary | ICD-10-CM | POA: Diagnosis not present

## 2020-04-12 DIAGNOSIS — H353231 Exudative age-related macular degeneration, bilateral, with active choroidal neovascularization: Secondary | ICD-10-CM | POA: Diagnosis not present

## 2020-04-12 DIAGNOSIS — H35371 Puckering of macula, right eye: Secondary | ICD-10-CM | POA: Diagnosis not present

## 2020-04-12 DIAGNOSIS — R2681 Unsteadiness on feet: Secondary | ICD-10-CM | POA: Diagnosis not present

## 2020-04-12 DIAGNOSIS — H43393 Other vitreous opacities, bilateral: Secondary | ICD-10-CM | POA: Diagnosis not present

## 2020-04-12 DIAGNOSIS — H43813 Vitreous degeneration, bilateral: Secondary | ICD-10-CM | POA: Diagnosis not present

## 2020-04-13 ENCOUNTER — Non-Acute Institutional Stay (SKILLED_NURSING_FACILITY): Payer: Medicare Other | Admitting: Family

## 2020-04-13 ENCOUNTER — Encounter: Payer: Self-pay | Admitting: Family

## 2020-04-13 DIAGNOSIS — R1111 Vomiting without nausea: Secondary | ICD-10-CM | POA: Diagnosis not present

## 2020-04-13 DIAGNOSIS — D649 Anemia, unspecified: Secondary | ICD-10-CM | POA: Diagnosis not present

## 2020-04-13 DIAGNOSIS — R2681 Unsteadiness on feet: Secondary | ICD-10-CM | POA: Diagnosis not present

## 2020-04-13 DIAGNOSIS — M6281 Muscle weakness (generalized): Secondary | ICD-10-CM | POA: Diagnosis not present

## 2020-04-13 DIAGNOSIS — R55 Syncope and collapse: Secondary | ICD-10-CM

## 2020-04-13 DIAGNOSIS — D509 Iron deficiency anemia, unspecified: Secondary | ICD-10-CM | POA: Diagnosis not present

## 2020-04-13 DIAGNOSIS — I10 Essential (primary) hypertension: Secondary | ICD-10-CM | POA: Diagnosis not present

## 2020-04-13 LAB — BASIC METABOLIC PANEL
BUN: 32 — AB (ref 4–21)
CO2: 22 (ref 13–22)
Chloride: 103 (ref 99–108)
Creatinine: 2 — AB (ref 0.6–1.3)
Glucose: 129
Potassium: 4.9 (ref 3.4–5.3)
Sodium: 134 — AB (ref 137–147)

## 2020-04-13 LAB — HEPATIC FUNCTION PANEL
ALT: 13 (ref 10–40)
AST: 19 (ref 14–40)
Alkaline Phosphatase: 57 (ref 25–125)
Bilirubin, Total: 0.3

## 2020-04-13 LAB — CBC AND DIFFERENTIAL
HCT: 25 — AB (ref 41–53)
Hemoglobin: 8.1 — AB (ref 13.5–17.5)
Platelets: 231 (ref 150–399)
WBC: 6.2

## 2020-04-13 LAB — COMPREHENSIVE METABOLIC PANEL
Albumin: 3.9 (ref 3.5–5.0)
Calcium: 9.3 (ref 8.7–10.7)
GFR calc Af Amer: 34
GFR calc non Af Amer: 29

## 2020-04-13 LAB — CBC: RBC: 3.04 — AB (ref 3.87–5.11)

## 2020-04-13 NOTE — Progress Notes (Signed)
Location:    [facility]   [facility]ing Home Room Number: 215-D Place of Service:  SNF (31) Provider:  Marlowe Sax, NP    Patient Care Team: Gayland Curry, DO as PCP - General (Geriatric Medicine) Rehab, [place] ([place])  Extended Emergency Contact Information Primary Emergency Contact: Orpah Melter Address: [place], [place] [address] [name] of [place] Phone: [phone] Mobile Phone: [phone] Relation: Son  Code Status:  DNR Goals of care: Advanced Directive information Advanced Directives 04/13/2020  Does Patient Have a Medical Advance Directive? Yes  Type of Paramedic of [place];Out of facility DNR (pink MOST or yellow form)  Does patient want to make changes to medical advance directive? No - Patient declined  Copy of [place] in Chart? Yes - validated most recent copy scanned in chart (See row information)  Would patient like information on creating a medical advance directive? -  Pre-existing out of facility DNR order (yellow form or pink MOST form) Yellow form placed in chart (order not valid for inpatient use)     Chief Complaint  Patient presents with  . Acute Visit    Passed out using the toilet.     HPI:  Pt is a 86 y.o. male seen today for an acute visit for syncope.Facility Nurse reports patient was using the bathroom had a bowel movement and passed out briefly.He was assisted back to bed.Also vomited after being assisted to bed.Patient up in bed back to his baseline. No fever,chils or cough. He has a significant medical history of syncope.    Past Medical History:  Diagnosis Date  . Alzheimer's dementia ([place])   . Hypertension   . Parkinson disease ([place])   . Prostate cancer ([place])   . Renal disorder   . Syncope    Past Surgical History:  Procedure Laterality Date  . BACK SURGERY    . IR VERTEBROPLASTY CERV/THOR BX INC  UNI/BIL INC/INJECT/IMAGING  01/31/2018  . IR VERTEBROPLASTY CERV/THOR BX INC UNI/BIL INC/INJECT/IMAGING  03/24/2019    No Known Allergies  Allergies as of 04/13/2020   No Known Allergies     Medication List       Accurate as of April 13, 2020  2:11 PM. If you have any questions, ask your nurse or doctor.        amLODipine 2.5 MG tablet Commonly known as: NORVASC Take 1 tablet (2.5 mg total) by mouth daily.   ascorbic acid 500 MG tablet Commonly known as: VITAMIN C Take 500 mg by mouth daily.   aspirin 81 MG chewable tablet Chew 1 tablet (81 mg total) by mouth daily.   BABY SHAMPOO EX Apply topically. Baby shampo ( less than a dime size ) with warm water can be use for eye hygiene   carbidopa-levodopa 25-250 MG tablet Commonly known as: SINEMET IR Take 1 tablet by mouth 3 (three) times daily. 9am, 1pm, 7pm   cloNIDine 0.2 MG tablet Commonly known as: CATAPRES Take 0.2 mg by mouth 3 (three) times daily. 9am, 1pm, 7pm   D3-50 1.25 MG (50000 UT) capsule Generic drug: Cholecalciferol Take 50,000 Units by mouth every 30 (thirty) days.   feeding supplement (ENSURE ENLIVE) Liqd Take 237 mLs by mouth 2 (two) times daily between meals.   multivitamin with minerals Tabs tablet Take 1 tablet by mouth daily.   NON FORMULARY Diet  Order: Pureed Diet, no therapeutic restrictions   omeprazole 40 MG capsule Commonly known as: PRILOSEC Take 40 mg by mouth daily.   ondansetron 4 MG tablet Commonly known as: ZOFRAN Take 4 mg by mouth every 6 (six) hours as needed for nausea or vomiting.   polyethylene glycol 17 g packet Commonly known as: MIRALAX / GLYCOLAX Take 17 g by mouth daily as needed for mild constipation.   ROBITUSSIN COUGH+CHEST CONG DM PO Take 20 mLs by mouth 2 (two) times daily as needed.   SEROQUEL PO Take 12.5 mg by mouth every other day. At bedtime for 1 week then STOP 04/25/2020 What changed: Another medication with the same name was removed. Continue taking  this medication, and follow the directions you see here. Changed by: Sandrea Hughs, NP   SEROQUEL PO Take 12.5 mg by mouth at bedtime. What changed: Another medication with the same name was removed. Continue taking this medication, and follow the directions you see here. Changed by: Sandrea Hughs, NP   thiamine 100 MG tablet Commonly known as: Vitamin B-1 Take 100 mg by mouth daily. Take at 1 PM       Review of Systems  Unable to perform ROS: Dementia    Immunization History  Administered Date(s) Administered  . Fluad Quad(high Dose 65+) 05/23/2019  . Influenza, High Dose Seasonal PF 06/26/2017, 06/17/2018  . Influenza-Unspecified 06/22/2016, 06/26/2017  . Moderna SARS-COVID-2 Vaccination 10/08/2019, 11/05/2019  . Pneumococcal Polysaccharide-23 01/07/2015, 05/11/2017  . Zoster Recombinat (Shingrix) 12/11/2017, 03/18/2018   Pertinent  Health Maintenance Due  Topic Date Due  . PNA vac Low Risk Adult (2 of 2 - PCV13) 05/11/2018  . INFLUENZA VACCINE  04/10/2020   Fall Risk  01/27/2020  Risk for fall due to : History of fall(s);Impaired balance/gait;Impaired mobility;Medication side effect;Impaired vision;Mental status change  Follow up Falls evaluation completed;Education provided;Falls prevention discussed    Vitals:   04/13/20 1401  BP: (!) 155/86  Pulse: 92  Resp: 18  Temp: 97.8 F (36.6 C)  SpO2: 97%  Weight: 131 lb 9.6 oz (59.7 kg)  Height: 6\' 1"  (1.854 m)   Body mass index is 17.36 kg/m. Physical Exam Vitals reviewed.  Constitutional:      General: He is not in acute distress.    Appearance: He is underweight.  HENT:     Head: Normocephalic.     Mouth/Throat:     Mouth: Mucous membranes are moist.     Pharynx: No oropharyngeal exudate or posterior oropharyngeal erythema.  Eyes:     General: No scleral icterus.       Right eye: No discharge.        Left eye: No discharge.     Conjunctiva/sclera: Conjunctivae normal.     Pupils: Pupils are equal,  round, and reactive to light.  Cardiovascular:     Rate and Rhythm: Normal rate and regular rhythm.     Pulses: Normal pulses.     Heart sounds: Normal heart sounds. No murmur heard.  No friction rub. No gallop.   Pulmonary:     Effort: Pulmonary effort is normal. No respiratory distress.     Breath sounds: Normal breath sounds. No wheezing, rhonchi or rales.  Chest:     Chest wall: No tenderness.  Abdominal:     General: Bowel sounds are normal. There is no distension.     Palpations: Abdomen is soft. There is no mass.     Tenderness: There is no abdominal tenderness. There is  no right CVA tenderness, left CVA tenderness, guarding or rebound.     Comments: Moderate amounts of chocolate colored emesis noted on patient's gown.  Musculoskeletal:        General: No swelling or tenderness.     Comments: Moves x 4 extremities.Bilateral lower extremities trace edema  Skin:    General: Skin is warm.     Coloration: Skin is not pale.     Findings: No bruising, erythema or rash.  Neurological:     Mental Status: He is alert. Mental status is at baseline.     Motor: Weakness present.     Gait: Gait abnormal.     Comments: Alert to self and person follows simple commands   Psychiatric:        Mood and Affect: Mood normal.        Speech: Speech normal.        Behavior: Behavior normal.    Labs reviewed: Recent Labs    04/22/19 0441 04/22/19 0441 04/23/19 0428 05/20/19 1140 05/20/19 1900 05/22/19 0110 09/01/19 1645 09/01/19 1645 09/02/19 0422 09/02/19 0422 09/03/19 0523 09/29/19 0000 01/28/20 0000 02/04/20 0000 02/25/20 0000  NA 142   < > 143   < >  --    < > 137   < > 138   < > 137   < > 138 139 138  K 3.0*   < > 4.0   < >  --    < > 4.2   < > 4.6   < > 4.4   < > 5.2 4.4 4.7  CL 104   < > 110   < >  --    < > 103   < > 107   < > 104   < > 108 107 110*  CO2 29   < > 26   < >  --    < > 25   < > 21*   < > 24   < > 19 19 20   GLUCOSE 104*   < > 102*   < >  --    < > 108*  --   101*  --  92  --   --   --   --   BUN 28*   < > 28*   < >  --    < > 39*   < > 36*   < > 33*   < > 50* 37* 40*  CREATININE 1.84*   < > 1.73*   < >  --    < > 1.61*   < > 1.54*   < > 1.23   < > 2.0* 1.7* 1.5*  CALCIUM 7.9*   < > 7.9*   < >  --    < > 9.0   < > 8.9   < > 9.3   < > 9.3 9.6 9.7  MG 1.7  --  1.7  --  1.8  --   --   --   --   --   --   --   --   --   --    < > = values in this interval not displayed.   Recent Labs    04/20/19 1701 05/20/19 1140 11/25/19 0000  AST 35 10* 16  ALT 17 5 8*  ALKPHOS 80 56 61  BILITOT 0.5 0.3  --   PROT 6.3* 5.6*  --   ALBUMIN 3.3* 2.9* 3.7   Recent  Labs    06/01/19 1957 06/26/19 0000 09/01/19 1645 09/01/19 1645 09/02/19 0422 09/02/19 0422 09/03/19 0523 09/29/19 0000 12/31/19 0000 01/07/20 0000 02/25/20 0000  WBC   < > 7.1 6.1   < > 7.9   < > 7.5   < > 5.9 7.1 5.8  NEUTROABS  --  5 4.0  --   --   --  4.8  --   --   --   --   HGB   < > 10.1* 6.8*   < > 8.1*   < > 9.6*   < > 8.6* 8.3* 8.9*  HCT   < > 30* 23.0*   < > 27.1*   < > 31.0*   < > 26* 25* 28*  MCV   < >  --  89.8  --  92.8  --  91.4  --   --   --   --   PLT   < > 136* 247   < > 209   < > 211   < > 165 176 151   < > = values in this interval not displayed.   Lab Results  Component Value Date   TSH 2.21 11/25/2019   Lab Results  Component Value Date   HGBA1C 5.7 02/25/2020   Lab Results  Component Value Date   CHOL 99 11/25/2019   HDL 50 11/25/2019   LDLCALC 42 11/25/2019   TRIG 36 (A) 11/25/2019   CHOLHDL 3.3 03/30/2019    Significant Diagnostic Results in last 30 days:  No results found.  Assessment/Plan  1. Vasovagal syncope Had brief episode of passing out after his bowel movement in the bathroom.He was assisted back to bed by staff.Vital signs stable and was back to his baseline. - will obtain stat CBC/diff and CMP to rule out acute and metabolic etiologies.   2. Non-intractable vomiting without nausea, unspecified vomiting type Afebrile.vomitted  ensure supplement that was drinking prior to episode. - unclear cause post syncope. - Stat CBC/diff,CMP   Family/ staff Communication: Reviewed plan of care with patient and facility Nurse   Labs/tests ordered: Stat CBC/diff,BMP

## 2020-04-14 DIAGNOSIS — D509 Iron deficiency anemia, unspecified: Secondary | ICD-10-CM | POA: Diagnosis not present

## 2020-04-14 DIAGNOSIS — R2681 Unsteadiness on feet: Secondary | ICD-10-CM | POA: Diagnosis not present

## 2020-04-14 DIAGNOSIS — M6281 Muscle weakness (generalized): Secondary | ICD-10-CM | POA: Diagnosis not present

## 2020-04-15 DIAGNOSIS — M6281 Muscle weakness (generalized): Secondary | ICD-10-CM | POA: Diagnosis not present

## 2020-04-15 DIAGNOSIS — R2681 Unsteadiness on feet: Secondary | ICD-10-CM | POA: Diagnosis not present

## 2020-04-15 DIAGNOSIS — D509 Iron deficiency anemia, unspecified: Secondary | ICD-10-CM | POA: Diagnosis not present

## 2020-04-18 DIAGNOSIS — M6281 Muscle weakness (generalized): Secondary | ICD-10-CM | POA: Diagnosis not present

## 2020-04-18 DIAGNOSIS — D509 Iron deficiency anemia, unspecified: Secondary | ICD-10-CM | POA: Diagnosis not present

## 2020-04-18 DIAGNOSIS — R2681 Unsteadiness on feet: Secondary | ICD-10-CM | POA: Diagnosis not present

## 2020-04-19 DIAGNOSIS — R2681 Unsteadiness on feet: Secondary | ICD-10-CM | POA: Diagnosis not present

## 2020-04-19 DIAGNOSIS — D509 Iron deficiency anemia, unspecified: Secondary | ICD-10-CM | POA: Diagnosis not present

## 2020-04-19 DIAGNOSIS — M6281 Muscle weakness (generalized): Secondary | ICD-10-CM | POA: Diagnosis not present

## 2020-04-20 DIAGNOSIS — D509 Iron deficiency anemia, unspecified: Secondary | ICD-10-CM | POA: Diagnosis not present

## 2020-04-20 DIAGNOSIS — M6281 Muscle weakness (generalized): Secondary | ICD-10-CM | POA: Diagnosis not present

## 2020-04-20 DIAGNOSIS — R2681 Unsteadiness on feet: Secondary | ICD-10-CM | POA: Diagnosis not present

## 2020-04-21 DIAGNOSIS — M6281 Muscle weakness (generalized): Secondary | ICD-10-CM | POA: Diagnosis not present

## 2020-04-21 DIAGNOSIS — D509 Iron deficiency anemia, unspecified: Secondary | ICD-10-CM | POA: Diagnosis not present

## 2020-04-21 DIAGNOSIS — R2681 Unsteadiness on feet: Secondary | ICD-10-CM | POA: Diagnosis not present

## 2020-04-22 DIAGNOSIS — M6281 Muscle weakness (generalized): Secondary | ICD-10-CM | POA: Diagnosis not present

## 2020-04-22 DIAGNOSIS — R2681 Unsteadiness on feet: Secondary | ICD-10-CM | POA: Diagnosis not present

## 2020-04-22 DIAGNOSIS — D509 Iron deficiency anemia, unspecified: Secondary | ICD-10-CM | POA: Diagnosis not present

## 2020-04-25 DIAGNOSIS — R2681 Unsteadiness on feet: Secondary | ICD-10-CM | POA: Diagnosis not present

## 2020-04-25 DIAGNOSIS — D509 Iron deficiency anemia, unspecified: Secondary | ICD-10-CM | POA: Diagnosis not present

## 2020-04-25 DIAGNOSIS — M6281 Muscle weakness (generalized): Secondary | ICD-10-CM | POA: Diagnosis not present

## 2020-04-26 DIAGNOSIS — D509 Iron deficiency anemia, unspecified: Secondary | ICD-10-CM | POA: Diagnosis not present

## 2020-04-26 DIAGNOSIS — R2681 Unsteadiness on feet: Secondary | ICD-10-CM | POA: Diagnosis not present

## 2020-04-26 DIAGNOSIS — M6281 Muscle weakness (generalized): Secondary | ICD-10-CM | POA: Diagnosis not present

## 2020-04-27 DIAGNOSIS — R2681 Unsteadiness on feet: Secondary | ICD-10-CM | POA: Diagnosis not present

## 2020-04-27 DIAGNOSIS — M6281 Muscle weakness (generalized): Secondary | ICD-10-CM | POA: Diagnosis not present

## 2020-04-27 DIAGNOSIS — D509 Iron deficiency anemia, unspecified: Secondary | ICD-10-CM | POA: Diagnosis not present

## 2020-04-28 DIAGNOSIS — D509 Iron deficiency anemia, unspecified: Secondary | ICD-10-CM | POA: Diagnosis not present

## 2020-04-28 DIAGNOSIS — M6281 Muscle weakness (generalized): Secondary | ICD-10-CM | POA: Diagnosis not present

## 2020-04-28 DIAGNOSIS — R2681 Unsteadiness on feet: Secondary | ICD-10-CM | POA: Diagnosis not present

## 2020-04-29 DIAGNOSIS — R2681 Unsteadiness on feet: Secondary | ICD-10-CM | POA: Diagnosis not present

## 2020-04-29 DIAGNOSIS — M6281 Muscle weakness (generalized): Secondary | ICD-10-CM | POA: Diagnosis not present

## 2020-04-29 DIAGNOSIS — D509 Iron deficiency anemia, unspecified: Secondary | ICD-10-CM | POA: Diagnosis not present

## 2020-05-02 DIAGNOSIS — M6281 Muscle weakness (generalized): Secondary | ICD-10-CM | POA: Diagnosis not present

## 2020-05-02 DIAGNOSIS — R2681 Unsteadiness on feet: Secondary | ICD-10-CM | POA: Diagnosis not present

## 2020-05-02 DIAGNOSIS — D509 Iron deficiency anemia, unspecified: Secondary | ICD-10-CM | POA: Diagnosis not present

## 2020-05-03 DIAGNOSIS — M6281 Muscle weakness (generalized): Secondary | ICD-10-CM | POA: Diagnosis not present

## 2020-05-03 DIAGNOSIS — R2681 Unsteadiness on feet: Secondary | ICD-10-CM | POA: Diagnosis not present

## 2020-05-03 DIAGNOSIS — D509 Iron deficiency anemia, unspecified: Secondary | ICD-10-CM | POA: Diagnosis not present

## 2020-05-04 DIAGNOSIS — M6281 Muscle weakness (generalized): Secondary | ICD-10-CM | POA: Diagnosis not present

## 2020-05-04 DIAGNOSIS — R2681 Unsteadiness on feet: Secondary | ICD-10-CM | POA: Diagnosis not present

## 2020-05-04 DIAGNOSIS — D509 Iron deficiency anemia, unspecified: Secondary | ICD-10-CM | POA: Diagnosis not present

## 2020-05-10 DIAGNOSIS — H35371 Puckering of macula, right eye: Secondary | ICD-10-CM | POA: Diagnosis not present

## 2020-05-10 DIAGNOSIS — H353211 Exudative age-related macular degeneration, right eye, with active choroidal neovascularization: Secondary | ICD-10-CM | POA: Diagnosis not present

## 2020-05-10 DIAGNOSIS — H353221 Exudative age-related macular degeneration, left eye, with active choroidal neovascularization: Secondary | ICD-10-CM | POA: Diagnosis not present

## 2020-05-10 DIAGNOSIS — H35033 Hypertensive retinopathy, bilateral: Secondary | ICD-10-CM | POA: Diagnosis not present

## 2020-05-10 DIAGNOSIS — H353231 Exudative age-related macular degeneration, bilateral, with active choroidal neovascularization: Secondary | ICD-10-CM | POA: Diagnosis not present

## 2020-05-10 DIAGNOSIS — H43813 Vitreous degeneration, bilateral: Secondary | ICD-10-CM | POA: Diagnosis not present

## 2020-05-19 DIAGNOSIS — L603 Nail dystrophy: Secondary | ICD-10-CM | POA: Diagnosis not present

## 2020-05-19 DIAGNOSIS — B351 Tinea unguium: Secondary | ICD-10-CM | POA: Diagnosis not present

## 2020-05-19 DIAGNOSIS — I739 Peripheral vascular disease, unspecified: Secondary | ICD-10-CM | POA: Diagnosis not present

## 2020-05-19 DIAGNOSIS — M2041 Other hammer toe(s) (acquired), right foot: Secondary | ICD-10-CM | POA: Diagnosis not present

## 2020-05-19 DIAGNOSIS — M2042 Other hammer toe(s) (acquired), left foot: Secondary | ICD-10-CM | POA: Diagnosis not present

## 2020-05-25 ENCOUNTER — Non-Acute Institutional Stay (SKILLED_NURSING_FACILITY): Payer: Medicare Other | Admitting: Family

## 2020-05-25 ENCOUNTER — Encounter: Payer: Self-pay | Admitting: Family

## 2020-05-25 DIAGNOSIS — S80211A Abrasion, right knee, initial encounter: Secondary | ICD-10-CM

## 2020-05-25 DIAGNOSIS — Y92129 Unspecified place in nursing home as the place of occurrence of the external cause: Secondary | ICD-10-CM | POA: Diagnosis not present

## 2020-05-25 DIAGNOSIS — T148XXA Other injury of unspecified body region, initial encounter: Secondary | ICD-10-CM | POA: Diagnosis not present

## 2020-05-25 DIAGNOSIS — W19XXXA Unspecified fall, initial encounter: Secondary | ICD-10-CM

## 2020-05-25 NOTE — Progress Notes (Signed)
Location:  [facility] Room Number: 215 D Place of Service:  SNF (31) Provider:Lucette Kratz  C. Lillion Elbert  FNP-C  Gayland Curry, DO  Patient Care Team: Gayland Curry, DO as PCP - General (Geriatric Medicine) Rehab, [place] ([place])  Extended Emergency Contact Information Primary Emergency Contact: Orpah Melter Address: [place], [place] [address] [name] of [place] Phone: [phone] Mobile Phone: [phone] Relation: Son  Code Status: DNR Goals of care: Advanced Directive information Advanced Directives 05/25/2020  Does Patient Have a Medical Advance Directive? Yes  Type of Advance Directive Out of facility DNR (pink MOST or yellow form)  Does patient want to make changes to medical advance directive? No - Patient declined  Copy of [place] in Chart? -  Would patient like information on creating a medical advance directive? -  Pre-existing out of facility DNR order (yellow form or pink MOST form) Pink MOST/Yellow Form most recent copy in chart - Physician notified to receive inpatient order     Chief Complaint  Patient presents with   Acute Visit    Patient had a fall     HPI:  Pt is a 86 y.o. male seen today for an acute visit for follow up fall.Facility Nurse reports patient was observed lying down on the floor on his right side near his refrigerator.He sustained an abrasion on right knee and small hematoma on top of the head.No loss of consciousness, neuro check and vital signs all stable.He denies any pain. Unclear whether he was trying to get out of bed without assistance. Patient usually transfers with assist to wheelchair. He is resting quietly in the bed during visit.he denies any acute issues. He is not on any Aspirin or anticoagulant.   Past Medical History:  Diagnosis Date   Alzheimer's dementia ([place])    Hypertension    Parkinson disease  ([place])    Prostate cancer ([place])    Renal disorder    Syncope    Past Surgical History:  Procedure Laterality Date   BACK SURGERY     IR VERTEBROPLASTY CERV/THOR BX INC UNI/BIL INC/INJECT/IMAGING  01/31/2018   IR VERTEBROPLASTY CERV/THOR BX INC UNI/BIL INC/INJECT/IMAGING  03/24/2019    No Known Allergies  Outpatient Encounter Medications as of 05/25/2020  Medication Sig   amLODipine (NORVASC) 2.5 MG tablet Take 1 tablet (2.5 mg total) by mouth daily.   ascorbic acid (VITAMIN C) 500 MG tablet Take 500 mg by mouth daily.   aspirin 81 MG chewable tablet Chew 1 tablet (81 mg total) by mouth daily.   carbidopa-levodopa (SINEMET IR) 25-250 MG tablet Take 1 tablet by mouth 3 (three) times daily. 9am, 1pm, 7pm   Cholecalciferol (D3-50) 1.25 MG (50000 UT) capsule Take 50,000 Units by mouth every 30 (thirty) days.   cloNIDine (CATAPRES) 0.2 MG tablet Take 0.2 mg by mouth 3 (three) times daily. 9am, 1pm, 7pm   Dextromethorphan-guaiFENesin (ROBITUSSIN COUGH+CHEST CONG DM PO) Take 20 mLs by mouth 2 (two) times daily as needed.   feeding supplement, ENSURE ENLIVE, (ENSURE ENLIVE) LIQD Take 237 mLs by mouth 2 (two) times daily between meals.   Infant Care Products (BABY SHAMPOO EX) Apply topically. Baby shampo ( less than a dime size ) with warm water can be use for eye hygiene   Multiple Vitamin (MULTIVITAMIN WITH MINERALS) TABS tablet Take 1 tablet by mouth daily.  NON FORMULARY Diet Order: Pureed Diet, no therapeutic restrictions   omeprazole (PRILOSEC) 40 MG capsule Take 40 mg by mouth daily.   ondansetron (ZOFRAN) 4 MG tablet Take 4 mg by mouth every 6 (six) hours as needed for nausea or vomiting.   polyethylene glycol (MIRALAX / GLYCOLAX) 17 g packet Take 17 g by mouth daily as needed for mild constipation.   thiamine (VITAMIN B-1) 100 MG tablet Take 100 mg by mouth daily. Take at 1 PM   [DISCONTINUED] QUEtiapine Fumarate (SEROQUEL PO) Take 12.5 mg by mouth every other day. At  bedtime for 1 week then STOP 04/25/2020   [DISCONTINUED] QUEtiapine Fumarate (SEROQUEL PO) Take 12.5 mg by mouth at bedtime.   No facility-administered encounter medications on file as of 05/25/2020.    Review of Systems  Constitutional: Negative for appetite change, chills, fatigue and fever.  HENT: Positive for hearing loss. Negative for congestion, postnasal drip, rhinorrhea, sinus pressure, sinus pain, sneezing and sore throat.   Eyes: Negative for discharge, redness and itching.  Respiratory: Negative for cough, chest tightness, shortness of breath and wheezing.   Cardiovascular: Negative for chest pain, palpitations and leg swelling.  Gastrointestinal: Negative for abdominal distention, abdominal pain, constipation, diarrhea, nausea and vomiting.  Endocrine: Negative for cold intolerance, heat intolerance, polydipsia, polyphagia and polyuria.  Genitourinary: Negative for difficulty urinating and urgency.       Incontinent   Musculoskeletal: Positive for gait problem. Negative for joint swelling and myalgias.  Skin: Negative for color change, pallor and rash.  Neurological: Negative for dizziness, speech difficulty, light-headedness and headaches.  Hematological: Does not bruise/bleed easily.  Psychiatric/Behavioral: Negative for agitation, behavioral problems and sleep disturbance. The patient is not nervous/anxious.     Immunization History  Administered Date(s) Administered   Fluad Quad(high Dose 65+) 05/23/2019   Influenza, High Dose Seasonal PF 06/26/2017, 06/17/2018   Influenza-Unspecified 06/22/2016, 06/26/2017   Moderna SARS-COVID-2 Vaccination 10/08/2019, 11/05/2019   Pneumococcal Polysaccharide-23 01/07/2015, 05/11/2017   Zoster Recombinat (Shingrix) 12/11/2017, 03/18/2018   Pertinent  Health Maintenance Due  Topic Date Due   PNA vac Low Risk Adult (2 of 2 - PCV13) 05/11/2018   INFLUENZA VACCINE  04/10/2020   Fall Risk  01/27/2020  Risk for fall due to :  History of fall(s);Impaired balance/gait;Impaired mobility;Medication side effect;Impaired vision;Mental status change  Follow up Falls evaluation completed;Education provided;Falls prevention discussed    Vitals:   05/25/20 1332  BP: (!) 122/58  Pulse: 69  Temp: (!) 97 F (36.1 C)  Weight: 131 lb (59.4 kg)  Height: 6\' 1"  (1.854 m)   Body mass index is 17.28 kg/m. Physical Exam Vitals and nursing note reviewed.  Constitutional:      General: He is not in acute distress.    Appearance: He is underweight. He is not ill-appearing.  HENT:     Head: Normocephalic.     Nose: Nose normal. No congestion or rhinorrhea.     Mouth/Throat:     Mouth: Mucous membranes are moist.     Pharynx: Oropharynx is clear. No oropharyngeal exudate or posterior oropharyngeal erythema.  Eyes:     General: No scleral icterus.       Right eye: No discharge.        Left eye: No discharge.     Extraocular Movements: Extraocular movements intact.     Conjunctiva/sclera: Conjunctivae normal.     Pupils: Pupils are equal, round, and reactive to light.  Cardiovascular:     Rate and Rhythm: Normal rate  and regular rhythm.     Pulses: Normal pulses.     Heart sounds: Normal heart sounds. No murmur heard.  No friction rub. No gallop.   Pulmonary:     Effort: Pulmonary effort is normal. No respiratory distress.     Breath sounds: Normal breath sounds. No wheezing, rhonchi or rales.  Chest:     Chest wall: No tenderness.  Abdominal:     General: Bowel sounds are normal. There is no distension.     Palpations: Abdomen is soft. There is no mass.     Tenderness: There is no abdominal tenderness. There is no right CVA tenderness, left CVA tenderness, guarding or rebound.  Musculoskeletal:     Cervical back: No rigidity or tenderness.     Comments: UE's rigid and cog wheeling.Requires assistance with transfer.Thoracic spine concave curvature curled up in bed.   Lymphadenopathy:     Cervical: No cervical  adenopathy.  Skin:    General: Skin is warm and dry.     Coloration: Skin is not pale.     Findings: No bruising, erythema or rash.     Comments: Small skin abrasion on right knee without any swelling or bleeding. Top of the head small purple bruise noted slight tender to palpation.swelling resolved.   Neurological:     Mental Status: He is alert. Mental status is at baseline.     Gait: Gait abnormal.     Comments: HOH   Psychiatric:        Mood and Affect: Mood normal.        Speech: Speech normal.        Behavior: Behavior normal.        Cognition and Memory: Cognition is impaired. Memory is impaired.    Labs reviewed: Recent Labs    09/01/19 1645 09/01/19 1645 09/02/19 0422 09/02/19 0422 09/03/19 0523 09/29/19 0000 01/28/20 0000 02/04/20 0000 02/25/20 0000  NA 137   < > 138   < > 137   < > 138 139 138  K 4.2   < > 4.6   < > 4.4   < > 5.2 4.4 4.7  CL 103   < > 107   < > 104   < > 108 107 110*  CO2 25   < > 21*   < > 24   < > 19 19 20   GLUCOSE 108*  --  101*  --  92  --   --   --   --   BUN 39*   < > 36*   < > 33*   < > 50* 37* 40*  CREATININE 1.61*   < > 1.54*   < > 1.23   < > 2.0* 1.7* 1.5*  CALCIUM 9.0   < > 8.9   < > 9.3   < > 9.3 9.6 9.7   < > = values in this interval not displayed.   Recent Labs    11/25/19 0000  AST 16  ALT 8*  ALKPHOS 61  ALBUMIN 3.7   Recent Labs    06/26/19 0000 06/26/19 0000 09/01/19 1645 09/01/19 1645 09/02/19 0422 09/02/19 0422 09/03/19 0523 09/29/19 0000 12/31/19 0000 01/07/20 0000 02/25/20 0000  WBC 7.1  --  6.1   < > 7.9   < > 7.5   < > 5.9 7.1 5.8  NEUTROABS 5  --  4.0  --   --   --  4.8  --   --   --   --  HGB 10.1*   < > 6.8*   < > 8.1*   < > 9.6*   < > 8.6* 8.3* 8.9*  HCT 30*   < > 23.0*   < > 27.1*   < > 31.0*   < > 26* 25* 28*  MCV  --   --  89.8  --  92.8  --  91.4  --   --   --   --   PLT 136*   < > 247   < > 209   < > 211   < > 165 176 151   < > = values in this interval not displayed.   Lab Results    Component Value Date   TSH 2.21 11/25/2019   Lab Results  Component Value Date   HGBA1C 5.7 02/25/2020   Lab Results  Component Value Date   CHOL 99 11/25/2019   HDL 50 11/25/2019   LDLCALC 42 11/25/2019   TRIG 36 (A) 11/25/2019   CHOLHDL 3.3 03/30/2019    Significant Diagnostic Results in last 30 days:  No results found.  Assessment/Plan 1. Fall at nursing home, initial encounter Unwitnessed fall observed by staff lying on the right side.tried to get out of bed without assistance. Neuro check and vital signs stable. - continue on Fall and safety precaution. - continue to monitor Neuro check and vital signs per facility protocol.   2. Abrasion of right knee, initial encounter Small skin abrasion on right knee without any swelling or bleeding. Continue to monitor for any signs of infection.    3. Bruise Top of the head small purple bruise noted slight tender to palpation. Hematoma has resolved with ice/pressure.Vitals signs and Neuro checks stable.Not on anticoagulant.  - continue to monitor for now.   Family/ staff Communication: Reviewed plan of care with patient and facility Nurse   Labs/tests ordered: None

## 2020-06-07 DIAGNOSIS — H353231 Exudative age-related macular degeneration, bilateral, with active choroidal neovascularization: Secondary | ICD-10-CM | POA: Diagnosis not present

## 2020-06-08 ENCOUNTER — Encounter: Payer: Self-pay | Admitting: Family

## 2020-06-08 ENCOUNTER — Non-Acute Institutional Stay (SKILLED_NURSING_FACILITY): Payer: Medicare Other | Admitting: Family

## 2020-06-08 DIAGNOSIS — N183 Chronic kidney disease, stage 3 unspecified: Secondary | ICD-10-CM

## 2020-06-08 DIAGNOSIS — K219 Gastro-esophageal reflux disease without esophagitis: Secondary | ICD-10-CM

## 2020-06-08 DIAGNOSIS — Z66 Do not resuscitate: Secondary | ICD-10-CM | POA: Diagnosis not present

## 2020-06-08 DIAGNOSIS — I10 Essential (primary) hypertension: Secondary | ICD-10-CM

## 2020-06-08 DIAGNOSIS — G20 Parkinson's disease: Secondary | ICD-10-CM | POA: Diagnosis not present

## 2020-06-08 NOTE — Progress Notes (Addendum)
Location:    [facility]   Nursing Home Room Number: 215-D Place of Service:  SNF (31) Provider:  Marlowe Sax, NP  Patient Care Team: Gayland Curry, DO as PCP - General (Geriatric Medicine) Rehab, [place] ([place])  Extended Emergency Contact Information Primary Emergency Contact: Orpah Melter Address: [place], [place] [address] [name] of [place] Phone: [phone] Mobile Phone: [phone] Relation: Son  Code Status: DNR  Goals of care: Advanced Directive information Advanced Directives 06/08/2020  Does Patient Have a Medical Advance Directive? Yes  Type of Paramedic of [place];Out of facility DNR (pink MOST or yellow form)  Does patient want to make changes to medical advance directive? No - Patient declined  Copy of [place] in Chart? Yes - validated most recent copy scanned in chart (See row information)  Would patient like information on creating a medical advance directive? -  Pre-existing out of facility DNR order (yellow form or pink MOST form) -     Chief Complaint  Patient presents with   Medical Management of Chronic Issues    Routine Visit.    Immunizations    Discuss the need for Tetanus Vaccine, PNA Vaccine, and Influenza Vaccine.    HPI:  Pt is a 86 y.o. male seen today for medical management of chronic diseases. Has a medical history of Hypertension ,Prostate cancer,Parkinson disease ,Alzheimer's dementia he is seen in his room today sitting on wheelchair he answered question appropriately but limited HPI. He was seen yesterday 06/07/2020 at [facility] Dr. Princess Bruins for exudative Macular degeneration.He received injection to both eyes and was advised to follow up in 4 weeks.he denies any acute issues   He continues to require assistance with his ADL's.He self propels on wheelchair using his feet and  holding onto rail on the hallway. No recent fall episode since 05/25/2020 when he was observed by staff lying on the on his right side near his refrigerator.he sustained right knee abrasion and small hematoma on top of his head which are all resolved.Has had a 3 lbs weight loss since last visit.Facility Nutritionist continues to monitor and on protein supplements.He eats meals in the facility dinning room.  Influenza vaccine will be administered at the facility.    Past Medical History:  Diagnosis Date   Alzheimer's dementia ([place])    Hypertension    Parkinson disease ([place])    Prostate cancer ([place])    Renal disorder    Syncope    Past Surgical History:  Procedure Laterality Date   BACK SURGERY     IR VERTEBROPLASTY CERV/THOR BX INC UNI/BIL INC/INJECT/IMAGING  01/31/2018   IR VERTEBROPLASTY CERV/THOR BX INC UNI/BIL INC/INJECT/IMAGING  03/24/2019    No Known Allergies  Allergies as of 06/08/2020   No Known Allergies     Medication List       Accurate as of June 08, 2020  1:52 PM. If you have any questions, ask your nurse or doctor.        amLODipine 2.5 MG tablet Commonly known as: NORVASC Take 1 tablet (2.5 mg total) by mouth daily.   ascorbic acid 500 MG tablet Commonly known as: VITAMIN C Take 500 mg by mouth daily.   aspirin 81 MG chewable tablet Chew 1 tablet (81 mg total) by mouth daily.   BABY SHAMPOO EX Apply topically. Baby shampo (  less than a dime size ) with warm water can be use for eye hygiene   carbidopa-levodopa 25-250 MG tablet Commonly known as: SINEMET IR Take 1 tablet by mouth 3 (three) times daily. 9am, 1pm, 7pm   cloNIDine 0.2 MG tablet Commonly known as: CATAPRES Take 0.2 mg by mouth 3 (three) times daily. 9am, 1pm, 7pm   D3-50 1.25 MG (50000 UT) capsule Generic drug: Cholecalciferol Take 50,000 Units by mouth every 30 (thirty) days.   feeding supplement (ENSURE ENLIVE) Liqd Take 237 mLs by mouth 2 (two) times daily between  meals.   multivitamin with minerals Tabs tablet Take 1 tablet by mouth daily.   NON FORMULARY Diet Order: Pureed Diet, no therapeutic restrictions   omeprazole 40 MG capsule Commonly known as: PRILOSEC Take 40 mg by mouth daily.   ondansetron 4 MG tablet Commonly known as: ZOFRAN Take 4 mg by mouth every 6 (six) hours as needed for nausea or vomiting.   polyethylene glycol 17 g packet Commonly known as: MIRALAX / GLYCOLAX Take 17 g by mouth daily as needed for mild constipation.   ROBITUSSIN COUGH+CHEST CONG DM PO Take 20 mLs by mouth 2 (two) times daily as needed.   thiamine 100 MG tablet Commonly known as: Vitamin B-1 Take 100 mg by mouth daily. Take at 1 PM       Review of Systems  Unable to perform ROS: Dementia (additional information provided by facility Nurse )    Immunization History  Administered Date(s) Administered   Fluad Quad(high Dose 65+) 05/23/2019   Influenza, High Dose Seasonal PF 06/26/2017, 06/17/2018   Influenza-Unspecified 06/22/2016, 06/26/2017   Moderna SARS-COVID-2 Vaccination 10/08/2019, 11/05/2019   Pneumococcal Polysaccharide-23 01/07/2015, 05/11/2017   Zoster Recombinat (Shingrix) 12/11/2017, 03/18/2018   Pertinent  Health Maintenance Due  Topic Date Due   PNA vac Low Risk Adult (2 of 2 - PCV13) 05/11/2018   INFLUENZA VACCINE  04/10/2020   Fall Risk  01/27/2020  Risk for fall due to : History of fall(s);Impaired balance/gait;Impaired mobility;Medication side effect;Impaired vision;Mental status change  Follow up Falls evaluation completed;Education provided;Falls prevention discussed    Vitals:   06/08/20 1154  BP: 119/70  Pulse: 70  Resp: 18  Temp: (!) 97.2 F (36.2 C)  Weight: 128 lb (58.1 kg)  Height: 6\' 1"  (1.854 m)   Body mass index is 16.89 kg/m. Physical Exam Constitutional:      General: He is not in acute distress.    Appearance: He is underweight. He is not ill-appearing.  HENT:     Head:  Normocephalic.     Nose: Nose normal. No congestion or rhinorrhea.     Mouth/Throat:     Mouth: Mucous membranes are moist.     Pharynx: Oropharynx is clear. No oropharyngeal exudate or posterior oropharyngeal erythema.  Eyes:     General: No scleral icterus.       Right eye: No discharge.        Left eye: No discharge.     Extraocular Movements: Extraocular movements intact.     Conjunctiva/sclera: Conjunctivae normal.     Pupils: Pupils are equal, round, and reactive to light.  Cardiovascular:     Rate and Rhythm: Normal rate and regular rhythm.     Pulses: Normal pulses.     Heart sounds: Normal heart sounds. No murmur heard.  No friction rub. No gallop.   Pulmonary:     Effort: Pulmonary effort is normal. No respiratory distress.     Breath sounds:  Normal breath sounds. No wheezing, rhonchi or rales.  Chest:     Chest wall: No tenderness.  Abdominal:     General: Bowel sounds are normal. There is no distension.     Palpations: Abdomen is soft. There is no mass.     Tenderness: There is no abdominal tenderness. There is no right CVA tenderness, left CVA tenderness, guarding or rebound.  Musculoskeletal:        General: No swelling or tenderness.     Cervical back: Normal range of motion. No rigidity or tenderness.     Right lower leg: No edema.     Left lower leg: No edema.     Comments: Moves x 4 extremities transfers with assistance.  Lymphadenopathy:     Cervical: No cervical adenopathy.  Skin:    General: Skin is warm and dry.     Coloration: Skin is not pale.     Findings: No bruising, erythema or rash.  Neurological:     Mental Status: He is alert. Mental status is at baseline.     Cranial Nerves: No cranial nerve deficit.     Coordination: Coordination normal.     Gait: Gait abnormal.     Comments: Alert and oriented x 2 self and person   Psychiatric:        Mood and Affect: Mood normal.        Speech: Speech normal.        Behavior: Behavior normal.         Thought Content: Thought content normal.        Cognition and Memory: Memory is impaired.     Labs reviewed: Recent Labs    09/01/19 1645 09/01/19 1645 09/02/19 0422 09/02/19 0422 09/03/19 0523 09/29/19 0000 01/28/20 0000 02/04/20 0000 02/25/20 0000  NA 137   < > 138   < > 137   < > 138 139 138  K 4.2   < > 4.6   < > 4.4   < > 5.2 4.4 4.7  CL 103   < > 107   < > 104   < > 108 107 110*  CO2 25   < > 21*   < > 24   < > 19 19 20   GLUCOSE 108*  --  101*  --  92  --   --   --   --   BUN 39*   < > 36*   < > 33*   < > 50* 37* 40*  CREATININE 1.61*   < > 1.54*   < > 1.23   < > 2.0* 1.7* 1.5*  CALCIUM 9.0   < > 8.9   < > 9.3   < > 9.3 9.6 9.7   < > = values in this interval not displayed.   Recent Labs    11/25/19 0000  AST 16  ALT 8*  ALKPHOS 61  ALBUMIN 3.7   Recent Labs    06/26/19 0000 06/26/19 0000 09/01/19 1645 09/01/19 1645 09/02/19 0422 09/02/19 0422 09/03/19 0523 09/29/19 0000 12/31/19 0000 01/07/20 0000 02/25/20 0000  WBC 7.1  --  6.1   < > 7.9   < > 7.5   < > 5.9 7.1 5.8  NEUTROABS 5  --  4.0  --   --   --  4.8  --   --   --   --   HGB 10.1*   < > 6.8*   < > 8.1*   < >  9.6*   < > 8.6* 8.3* 8.9*  HCT 30*   < > 23.0*   < > 27.1*   < > 31.0*   < > 26* 25* 28*  MCV  --   --  89.8  --  92.8  --  91.4  --   --   --   --   PLT 136*   < > 247   < > 209   < > 211   < > 165 176 151   < > = values in this interval not displayed.   Lab Results  Component Value Date   TSH 2.21 11/25/2019   Lab Results  Component Value Date   HGBA1C 5.7 02/25/2020   Lab Results  Component Value Date   CHOL 99 11/25/2019   HDL 50 11/25/2019   LDLCALC 42 11/25/2019   TRIG 36 (A) 11/25/2019   CHOLHDL 3.3 03/30/2019    Significant Diagnostic Results in last 30 days:  No results found.  Assessment/Plan 1. Parkinson disease ([place]) Progressive decline expected.continue with supportive care. - continue on Carbidopa-levodopa 25-250 one by mouth three times daily.  2.  Essential hypertension B/p stable. Continue on amlodipine 2.5 mg tablet daily and clonidine 0.2 mg tablet three times daily   3. Stage 3 chronic kidney disease, unspecified whether stage 3a or 3b CKD (HCC) Latest CR 1.5 at baseline. Continue to avoid nephrotoxin and dose other meds for renal clearance.   4. Gastroesophageal reflux disease without esophagitis Latest H/H reviewed stable.  Continue on Omeprazole.  5. DNR (do not resuscitate) Order reviewed on chart.  - Do not attempt resuscitation (DNR)  Family/ staff Communication: Reviewed plan of care with facility Nurse   Labs/tests ordered: None

## 2020-06-24 ENCOUNTER — Encounter: Payer: Self-pay | Admitting: Internal Medicine

## 2020-06-24 ENCOUNTER — Non-Acute Institutional Stay (SKILLED_NURSING_FACILITY): Payer: Medicare Other | Admitting: Internal Medicine

## 2020-06-24 DIAGNOSIS — G20 Parkinson's disease: Secondary | ICD-10-CM | POA: Diagnosis not present

## 2020-06-24 DIAGNOSIS — E43 Unspecified severe protein-calorie malnutrition: Secondary | ICD-10-CM

## 2020-06-24 DIAGNOSIS — N183 Chronic kidney disease, stage 3 unspecified: Secondary | ICD-10-CM

## 2020-06-24 DIAGNOSIS — F02818 Dementia in other diseases classified elsewhere, unspecified severity, with other behavioral disturbance: Secondary | ICD-10-CM

## 2020-06-24 DIAGNOSIS — I10 Essential (primary) hypertension: Secondary | ICD-10-CM | POA: Diagnosis not present

## 2020-06-24 DIAGNOSIS — F0281 Dementia in other diseases classified elsewhere with behavioral disturbance: Secondary | ICD-10-CM

## 2020-06-24 DIAGNOSIS — R1312 Dysphagia, oropharyngeal phase: Secondary | ICD-10-CM

## 2020-06-24 DIAGNOSIS — D631 Anemia in chronic kidney disease: Secondary | ICD-10-CM

## 2020-06-24 DIAGNOSIS — N1832 Chronic kidney disease, stage 3b: Secondary | ICD-10-CM | POA: Diagnosis not present

## 2020-06-24 DIAGNOSIS — K219 Gastro-esophageal reflux disease without esophagitis: Secondary | ICD-10-CM

## 2020-06-24 DIAGNOSIS — G20A1 Parkinson's disease without dyskinesia, without mention of fluctuations: Secondary | ICD-10-CM

## 2020-06-24 NOTE — Progress Notes (Signed)
Location:  [facility] Room Number: [place]:  SNF [phone]) Provider:  Averiana Clouatre L. Mariea Clonts, D.O., C.M.D.  Gayland Curry, DO  Patient Care Team: Gayland Curry, DO as PCP - General (Geriatric Medicine) Rehab, [place] ([place])  Extended Emergency Contact Information Primary Emergency Contact: Orpah Melter Address: [address]          [place], [place] [address] [name] of [place] Phone: [phone] Mobile Phone: [phone] Relation: Son  Code Status:  DNR Goals of care: Advanced Directive information Advanced Directives 06/24/2020  Does Patient Have a Medical Advance Directive? -  Type of Advance Directive Out of facility DNR (pink MOST or yellow form)  Does patient want to make changes to medical advance directive? No - Patient declined  Copy of [place] in Chart? -  Would patient like information on creating a medical advance directive? -  Pre-existing out of facility DNR order (yellow form or pink MOST form) -     Chief Complaint  Patient presents with  . Medical Management of Chronic Issues    Routine [facility] visit  . Immunizations    Needs Prevnar-13    HPI:  Pt is an 86 y.o. male seen today for medical management of chronic diseases.  He has a past medical history significant for hypertension, postural orthostatic tachycardia syndrome and prior syncope, embolic stroke, cardiac arrhythmia/cardia, Parkinson's disease with dementia with behaviors, prostate cancer, chronic kidney disease stage III, severe protein calorie malnutrition, physical deconditioning, normocytic anemia due to chronic disease and iron deficiency, hyperlipidemia, hypomagnesemia, hypokalemia, hematuria.  He lives at [facility] for long-term care and is cared for by [facility].  BPs range 110s to 140s.  He had a fall about a month ago and was evaluated by NP--he had an abrasion to his right knee  and hematoma on top of his head, but was stable with VS and neurochecks and not on blood thinners.  He has been walking with restorative nursing.  He's alert and oriented x 2 and able to make his needs known verbally to staff.  He feeds himself with tray set-up and appetite has been good.  He went to [facility]/28 and has another appt this month.  He tries to ambulate without assistance and needs to be reminded.  Weight has oscillated in the lower 130s since May.  When seen, he was napping on his bed with his head up at the pillow and legs hanging down on the floor which is apparently typical for him.  He attempted to speak, but his hypophonia was severe and I could not understand him.  He kept closing his eyes again and seemed to want to be left alone.  Past Medical History:  Diagnosis Date  . Alzheimer's dementia ([place])   . Hypertension   . Parkinson disease ([place])   . Prostate cancer ([place])   . Renal disorder   . Syncope    Past Surgical History:  Procedure Laterality Date  . BACK SURGERY    . IR VERTEBROPLASTY CERV/THOR BX INC UNI/BIL INC/INJECT/IMAGING  01/31/2018  . IR VERTEBROPLASTY CERV/THOR BX INC UNI/BIL INC/INJECT/IMAGING  03/24/2019    No Known Allergies  Outpatient Encounter Medications as of 06/24/2020  Medication Sig  . amLODipine (NORVASC) 2.5 MG tablet Take 1 tablet (2.5 mg total) by mouth daily.  Marland Kitchen ascorbic acid (VITAMIN C) 500 MG tablet Take 500 mg by  mouth daily.  Marland Kitchen aspirin 81 MG chewable tablet Chew 1 tablet (81 mg total) by mouth daily.  . carbidopa-levodopa (SINEMET IR) 25-250 MG tablet Take 1 tablet by mouth 3 (three) times daily. 9am, 1pm, 7pm  . Cholecalciferol (D3-50) 1.25 MG (50000 UT) capsule Take 50,000 Units by mouth every 30 (thirty) days.  . cloNIDine (CATAPRES) 0.2 MG tablet Take 0.2 mg by mouth 3 (three) times daily. 9am, 1pm, 7pm  . Dextromethorphan-guaiFENesin (ROBITUSSIN COUGH+CHEST CONG DM PO) Take 20 mLs by mouth 2 (two) times daily as needed.    . feeding supplement, ENSURE ENLIVE, (ENSURE ENLIVE) LIQD Take 237 mLs by mouth 2 (two) times daily between meals.  . Infant Care Products (BABY SHAMPOO EX) Apply topically. Baby shampo ( less than a dime size ) with warm water can be use for eye hygiene  . Multiple Vitamin (MULTIVITAMIN WITH MINERALS) TABS tablet Take 1 tablet by mouth daily.  . NON FORMULARY Diet Order: Pureed Diet, no therapeutic restrictions  . omeprazole (PRILOSEC) 40 MG capsule Take 40 mg by mouth daily.  . ondansetron (ZOFRAN) 4 MG tablet Take 4 mg by mouth every 6 (six) hours as needed for nausea or vomiting.  . polyethylene glycol (MIRALAX / GLYCOLAX) 17 g packet Take 17 g by mouth daily as needed for mild constipation.  . thiamine (VITAMIN B-1) 100 MG tablet Take 100 mg by mouth daily. Take at 1 PM   No facility-administered encounter medications on file as of 06/24/2020.    Review of Systems  Constitutional: Positive for malaise/fatigue and weight loss. Negative for chills and fever.  HENT: Negative for congestion.   Eyes:       Retinal disease  Respiratory: Negative for cough and shortness of breath.   Cardiovascular: Negative for chest pain, palpitations and leg swelling.  Gastrointestinal: Negative for abdominal pain, blood in stool, constipation and melena.  Genitourinary: Negative for dysuria.  Musculoskeletal: Positive for falls.  Skin: Negative for itching and rash.  Neurological: Positive for tremors and weakness. Negative for dizziness and loss of consciousness.  Psychiatric/Behavioral: Positive for memory loss. Negative for depression. The patient is not nervous/anxious and does not have insomnia.     Immunization History  Administered Date(s) Administered  . Fluad Quad(high Dose 65+) 05/23/2019  . Influenza, High Dose Seasonal PF 06/26/2017, 06/17/2018  . Influenza-Unspecified 06/22/2016, 06/26/2017  . Moderna SARS-COVID-2 Vaccination 10/08/2019, 11/05/2019  . Pneumococcal Polysaccharide-23  01/07/2015, 05/11/2017  . Zoster Recombinat (Shingrix) 12/11/2017, 03/18/2018   Pertinent  Health Maintenance Due  Topic Date Due  . PNA vac Low Risk Adult (2 of 2 - PCV13) 05/11/2018  . INFLUENZA VACCINE  12/08/2020 (Originally 04/10/2020)   Fall Risk  01/27/2020  Risk for fall due to : History of fall(s);Impaired balance/gait;Impaired mobility;Medication side effect;Impaired vision;Mental status change  Follow up Falls evaluation completed;Education provided;Falls prevention discussed   Functional Status Survey:    Vitals:   06/24/20 1252  BP: 140/72  Pulse: 74  Temp: (!) 97.1 F (36.2 C)  Weight: 128 lb (58.1 kg)  Height: 6\' 1"  (1.854 m)   Body mass index is 16.89 kg/m. Physical Exam Vitals reviewed.  Constitutional:      General: He is not in acute distress.    Appearance: He is not toxic-appearing.     Comments: Frail male taking a nap   HENT:     Head: Normocephalic and atraumatic.  Cardiovascular:     Rate and Rhythm: Normal rate and regular rhythm.     Heart  sounds: No murmur heard.   Pulmonary:     Effort: Pulmonary effort is normal.     Breath sounds: Normal breath sounds. No wheezing, rhonchi or rales.  Abdominal:     General: Bowel sounds are normal.     Palpations: Abdomen is soft.     Tenderness: There is no abdominal tenderness.  Musculoskeletal:     Right lower leg: No edema.     Left lower leg: No edema.  Skin:    General: Skin is warm and dry.  Neurological:     Mental Status: Mental status is at baseline.     Motor: Weakness present.     Gait: Gait abnormal.     Comments: Resting tremor  Psychiatric:     Comments: Masked facies     Labs reviewed: Recent Labs    09/01/19 1645 09/01/19 1645 09/02/19 0422 09/02/19 0422 09/03/19 0523 09/29/19 0000 02/04/20 0000 02/25/20 0000 04/13/20 0000  NA 137   < > 138   < > 137   < > 139 138 134*  K 4.2   < > 4.6   < > 4.4   < > 4.4 4.7 4.9  CL 103   < > 107   < > 104   < > 107 110* 103  CO2  25   < > 21*   < > 24   < > 19 20 22   GLUCOSE 108*  --  101*  --  92  --   --   --   --   BUN 39*   < > 36*   < > 33*   < > 37* 40* 32*  CREATININE 1.61*   < > 1.54*   < > 1.23   < > 1.7* 1.5* 2.0*  CALCIUM 9.0   < > 8.9   < > 9.3   < > 9.6 9.7 9.3   < > = values in this interval not displayed.   Recent Labs    11/25/19 0000 04/13/20 0000  AST 16 19  ALT 8* 13  ALKPHOS 61 57  ALBUMIN 3.7 3.9   Recent Labs    06/26/19 0000 06/26/19 0000 09/01/19 1645 09/01/19 1645 09/02/19 0422 09/02/19 0422 09/03/19 0523 09/29/19 0000 01/07/20 0000 02/25/20 0000 04/13/20 0000  WBC 7.1  --  6.1   < > 7.9   < > 7.5   < > 7.1 5.8 6.2  NEUTROABS 5  --  4.0  --   --   --  4.8  --   --   --   --   HGB 10.1*   < > 6.8*   < > 8.1*   < > 9.6*   < > 8.3* 8.9* 8.1*  HCT 30*   < > 23.0*   < > 27.1*   < > 31.0*   < > 25* 28* 25*  MCV  --   --  89.8  --  92.8  --  91.4  --   --   --   --   PLT 136*   < > 247   < > 209   < > 211   < > 176 151 231   < > = values in this interval not displayed.   Lab Results  Component Value Date   TSH 2.21 11/25/2019   Lab Results  Component Value Date   HGBA1C 5.7 02/25/2020   Lab Results  Component Value Date   CHOL 99 11/25/2019  HDL 50 11/25/2019   LDLCALC 42 11/25/2019   TRIG 36 (A) 11/25/2019   CHOLHDL 3.3 03/30/2019     Assessment/Plan 1. Parkinson disease ([place]) -continues on tid sinemet 25/250 only -unsteady and prone to falls and often forgets to use assistive device or request help  2. Dementia due to Parkinson's disease with behavioral disturbance ([place]) -not on meds, cont SNF support for adls   3. Essential hypertension -bp satisfactory with current clonidine and amlodipine, monitor for dizziness  4. Stage 3 chronic kidney disease, unspecified whether stage 3a or 3b CKD (HCC) -Avoid nephrotoxic agents like nsaids, dose adjust renally excreted meds, hydrate.  5. Anemia in stage 3b chronic kidney disease ([place]) -not on any iron or  vitamins for this--does already have constipation and on a regimen, anemia was considerable in august with hgb 8.1 -does get ensure enlive   6. Gastroesophageal reflux disease without esophagitis -cont omeprazole   7. Oropharyngeal dysphagia -continues on pureed diet and BMI is low at 16.89, gets tray set up but feeds himself, very frail and cachectic despite supplements and best efforts  8. Protein-calorie malnutrition, severe ([place]) -remains considerable, is on thiamine supplement, ensure, pureed diet, multivitamin, D and C -cont to encourage small frequent meals/snacks that he can swallow  Family/ staff Communication: d/w snf nurse  Labs/tests ordered:  Last labs in August, recheck around dec and prn   Laketa Sandoz L. Meta Kroenke, D.O. [facility] [address], [place] [address] Cell Phone (Mon-Fri 8am-5pm):  [phone] On Call:  [phone] & follow prompts after 5pm & weekends Office Phone:  [phone] Office Fax:  [phone]

## 2020-06-27 ENCOUNTER — Encounter: Payer: Self-pay | Admitting: Internal Medicine

## 2020-06-30 ENCOUNTER — Encounter: Payer: Self-pay | Admitting: Family

## 2020-06-30 ENCOUNTER — Non-Acute Institutional Stay (SKILLED_NURSING_FACILITY): Payer: Medicare Other | Admitting: Family

## 2020-06-30 ENCOUNTER — Encounter (HOSPITAL_COMMUNITY): Payer: Self-pay

## 2020-06-30 ENCOUNTER — Emergency Department (HOSPITAL_COMMUNITY)
Admission: EM | Admit: 2020-06-30 | Discharge: 2020-07-01 | Disposition: A | Discharge status: Home / Self Care | Payer: Medicare Other | Attending: Emergency Medicine | Admitting: Emergency Medicine

## 2020-06-30 ENCOUNTER — Other Ambulatory Visit: Payer: Self-pay

## 2020-06-30 DIAGNOSIS — I129 Hypertensive chronic kidney disease with stage 1 through stage 4 chronic kidney disease, or unspecified chronic kidney disease: Secondary | ICD-10-CM | POA: Insufficient documentation

## 2020-06-30 DIAGNOSIS — D509 Iron deficiency anemia, unspecified: Secondary | ICD-10-CM | POA: Diagnosis not present

## 2020-06-30 DIAGNOSIS — G20 Parkinson's disease: Secondary | ICD-10-CM | POA: Insufficient documentation

## 2020-06-30 DIAGNOSIS — Z79899 Other long term (current) drug therapy: Secondary | ICD-10-CM | POA: Insufficient documentation

## 2020-06-30 DIAGNOSIS — N183 Chronic kidney disease, stage 3 unspecified: Secondary | ICD-10-CM | POA: Insufficient documentation

## 2020-06-30 DIAGNOSIS — W228XXA Striking against or struck by other objects, initial encounter: Secondary | ICD-10-CM | POA: Insufficient documentation

## 2020-06-30 DIAGNOSIS — Z7982 Long term (current) use of aspirin: Secondary | ICD-10-CM | POA: Insufficient documentation

## 2020-06-30 DIAGNOSIS — G309 Alzheimer's disease, unspecified: Secondary | ICD-10-CM | POA: Insufficient documentation

## 2020-06-30 DIAGNOSIS — Z87891 Personal history of nicotine dependence: Secondary | ICD-10-CM | POA: Insufficient documentation

## 2020-06-30 DIAGNOSIS — Y92129 Unspecified place in nursing home as the place of occurrence of the external cause: Secondary | ICD-10-CM | POA: Diagnosis not present

## 2020-06-30 DIAGNOSIS — R7889 Finding of other specified substances, not normally found in blood: Secondary | ICD-10-CM | POA: Diagnosis not present

## 2020-06-30 DIAGNOSIS — D649 Anemia, unspecified: Secondary | ICD-10-CM | POA: Diagnosis not present

## 2020-06-30 DIAGNOSIS — R6889 Other general symptoms and signs: Secondary | ICD-10-CM | POA: Diagnosis not present

## 2020-06-30 DIAGNOSIS — Z743 Need for continuous supervision: Secondary | ICD-10-CM | POA: Diagnosis not present

## 2020-06-30 DIAGNOSIS — R55 Syncope and collapse: Secondary | ICD-10-CM | POA: Diagnosis present

## 2020-06-30 DIAGNOSIS — Z8546 Personal history of malignant neoplasm of prostate: Secondary | ICD-10-CM | POA: Diagnosis not present

## 2020-06-30 LAB — COMPREHENSIVE METABOLIC PANEL
ALT: 10 U/L (ref 0–44)
AST: 25 U/L (ref 15–41)
Albumin: 4 g/dL (ref 3.5–5.0)
Alkaline Phosphatase: 54 U/L (ref 38–126)
Anion gap: 10 (ref 5–15)
BUN: 39 mg/dL — ABNORMAL HIGH (ref 8–23)
CO2: 19 mmol/L — ABNORMAL LOW (ref 22–32)
Calcium: 8.8 mg/dL — ABNORMAL LOW (ref 8.9–10.3)
Chloride: 102 mmol/L (ref 98–111)
Creatinine, Ser: 2.16 mg/dL — ABNORMAL HIGH (ref 0.61–1.24)
GFR, Estimated: 29 mL/min — ABNORMAL LOW (ref >60)
Glucose, Bld: 114 mg/dL — ABNORMAL HIGH (ref 70–99)
Potassium: 4.6 mmol/L (ref 3.5–5.1)
Sodium: 131 mmol/L — ABNORMAL LOW (ref 135–145)
Total Bilirubin: 0.4 mg/dL (ref 0.3–1.2)
Total Protein: 6.7 g/dL (ref 6.5–8.1)

## 2020-06-30 LAB — PROTIME-INR
INR: 1 (ref 0.8–1.2)
Prothrombin Time: 12.7 seconds (ref 11.4–15.2)

## 2020-06-30 LAB — CBC
HCT: 26.6 % — ABNORMAL LOW (ref 39.0–52.0)
Hemoglobin: 8 g/dL — ABNORMAL LOW (ref 13.0–17.0)
MCH: 23.1 pg — ABNORMAL LOW (ref 26.0–34.0)
MCHC: 30.1 g/dL (ref 30.0–36.0)
MCV: 76.7 fL — ABNORMAL LOW (ref 80.0–100.0)
Platelets: 234 10*3/uL (ref 150–400)
RBC: 3.05 — AB (ref 3.87–5.11)
RBC: 3.47 MIL/uL — ABNORMAL LOW (ref 4.22–5.81)
RDW: 17.9 % — ABNORMAL HIGH (ref 11.5–15.5)
WBC: 7.2 10*3/uL (ref 4.0–10.5)
nRBC: 0 % (ref 0.0–0.2)

## 2020-06-30 LAB — IRON,TIBC AND FERRITIN PANEL
Ferritin: 19.21
Iron: 20
TIBC: 328
UIBC: 309

## 2020-06-30 LAB — HEMOGLOBIN AND HEMATOCRIT, BLOOD
HCT: 27.9 % — ABNORMAL LOW (ref 39.0–52.0)
Hemoglobin: 8.9 g/dL — ABNORMAL LOW (ref 13.0–17.0)

## 2020-06-30 LAB — APTT: aPTT: 24 seconds (ref 24–36)

## 2020-06-30 LAB — CBC AND DIFFERENTIAL
HCT: 22 — AB (ref 41–53)
Hemoglobin: 6.9 — AB (ref 13.5–17.5)
Platelets: 191 (ref 150–399)
WBC: 5.2

## 2020-06-30 LAB — PREPARE RBC (CROSSMATCH)

## 2020-06-30 LAB — POC OCCULT BLOOD, ED: Fecal Occult Bld: NEGATIVE

## 2020-06-30 MED ORDER — CLONIDINE HCL 0.1 MG PO TABS: 0.2000 mg | ORAL_TABLET | Freq: Three times a day (TID) | ORAL | Status: DC

## 2020-06-30 MED ORDER — CARBIDOPA-LEVODOPA 25-250 MG PO TABS
1.0000 | ORAL_TABLET | Freq: Once | ORAL | Status: AC
  Administered 2020-06-30: 1 via ORAL
  Filled 2020-06-30: qty 1

## 2020-06-30 MED ORDER — CLONIDINE HCL 0.1 MG PO TABS
0.2000 mg | ORAL_TABLET | Freq: Once | ORAL | Status: AC
  Administered 2020-06-30: 0.2 mg via ORAL
  Filled 2020-06-30: qty 2

## 2020-06-30 MED ORDER — CARBIDOPA-LEVODOPA 25-250 MG PO TABS: 1.0000 | ORAL_TABLET | Freq: Three times a day (TID) | ORAL | Status: DC

## 2020-06-30 MED ORDER — SODIUM CHLORIDE 0.9% IV SOLUTION: Freq: Once | INTRAVENOUS | Status: AC

## 2020-06-30 NOTE — Progress Notes (Signed)
Location:    [facility].   Nursing Home Room Number: 215-D Place of Service:  SNF (31) Provider:  Marlowe Sax, NP    Patient Care Team: Gayland Curry, DO as PCP - General (Geriatric Medicine) Rehab, [place] ([place])  Extended Emergency Contact Information Primary Emergency Contact: Orpah Melter Address: [place], [place] [address] [name] Phone: [phone] Mobile Phone: [phone] Relation: Son  Code Status:  DNR Goals of care: Advanced Directive information Advanced Directives 06/30/2020  Does Patient Have a Medical Advance Directive? Yes  Type of Advance Directive Out of facility DNR (pink MOST or yellow form);Healthcare Power of Attorney  Does patient want to make changes to medical advance directive? No - Patient declined  Copy of [place] in Chart? Yes - validated most recent copy scanned in chart (See row information)  Would patient like information on creating a medical advance directive? -  Pre-existing out of facility DNR order (yellow form or pink MOST form) -     Chief Complaint  Patient presents with   Acute Visit    Critical Hgb level     HPI:  Pt is a 86 y.o. male seen today for an acute visit for evaluation of critical labs.He is seen in his room sitting on wheelchair just came back from the dinning eating lunch.Facility Nurse reports patient's lab results are critical.Hemoglobin level 6.9 today previous was 8.9.No bleeding reported.Labs ordered by Dr.Reed 06/24/2020 for follow up anemia.He denies any acute issues.He response verbally but quiet most of the visit does not provide HPI information.He nods or shakes his head for ye or no question.He states " when am I going to be send to the Hospital? " when told Hgb was critical.    Past Medical History:  Diagnosis Date   Alzheimer's dementia ([place])    Hypertension    Parkinson  disease ([place])    Prostate cancer ([place])    Renal disorder    Syncope    Past Surgical History:  Procedure Laterality Date   BACK SURGERY     IR VERTEBROPLASTY CERV/THOR BX INC UNI/BIL INC/INJECT/IMAGING  01/31/2018   IR VERTEBROPLASTY CERV/THOR BX INC UNI/BIL INC/INJECT/IMAGING  03/24/2019    No Known Allergies  Allergies as of 06/30/2020   No Known Allergies     Medication List       Accurate as of June 30, 2020  3:01 PM. If you have any questions, ask your nurse or doctor.        STOP taking these medications   ondansetron 4 MG tablet Commonly known as: ZOFRAN Stopped by: Sandrea Hughs, NP     TAKE these medications   amLODipine 2.5 MG tablet Commonly known as: NORVASC Take 1 tablet (2.5 mg total) by mouth daily.   ascorbic acid 500 MG tablet Commonly known as: VITAMIN C Take 500 mg by mouth daily.   aspirin 81 MG chewable tablet Chew 1 tablet (81 mg total) by mouth daily.   BABY SHAMPOO EX Apply topically. Baby shampo ( less than a dime size ) with warm water can be use for eye hygiene   carbidopa-levodopa 25-250 MG tablet Commonly known as: SINEMET IR Take 1 tablet by mouth 3 (three) times daily. 9am, 1pm, 7pm   cloNIDine 0.2 MG tablet Commonly known as: CATAPRES Take 0.2 mg by mouth 3 (three) times daily.  9am, 1pm, 7pm   D3-50 1.25 MG (50000 UT) capsule Generic drug: Cholecalciferol Take 50,000 Units by mouth every 30 (thirty) days.   feeding supplement Liqd Take 237 mLs by mouth 2 (two) times daily between meals.   multivitamin with minerals Tabs tablet Take 1 tablet by mouth daily.   NON FORMULARY Diet Order: Pureed Diet, no therapeutic restrictions   omeprazole 40 MG capsule Commonly known as: PRILOSEC Take 40 mg by mouth daily.   polyethylene glycol 17 g packet Commonly known as: MIRALAX / GLYCOLAX Take 17 g by mouth daily as needed for mild constipation.   ROBITUSSIN COUGH+CHEST CONG DM PO Take 20 mLs by mouth 2 (two) times  daily as needed.   thiamine 100 MG tablet Commonly known as: Vitamin B-1 Take 100 mg by mouth daily. Take at 1 PM       Review of Systems  Unable to perform ROS: Dementia (additional information provided by facility Nurse )    Immunization History  Administered Date(s) Administered   Fluad Quad(high Dose 65+) 05/23/2019   Influenza, High Dose Seasonal PF 06/26/2017, 06/17/2018   Influenza-Unspecified 06/22/2016, 06/26/2017   Moderna SARS-COVID-2 Vaccination 10/08/2019, 11/05/2019   Pneumococcal Polysaccharide-23 01/07/2015, 05/11/2017   Zoster Recombinat (Shingrix) 12/11/2017, 03/18/2018   Pertinent  Health Maintenance Due  Topic Date Due   PNA vac Low Risk Adult (2 of 2 - PCV13) 05/11/2018   INFLUENZA VACCINE  12/08/2020 (Originally 04/10/2020)   Fall Risk  01/27/2020  Risk for fall due to : History of fall(s);Impaired balance/gait;Impaired mobility;Medication side effect;Impaired vision;Mental status change  Follow up Falls evaluation completed;Education provided;Falls prevention discussed   Functional Status Survey:    Vitals:   06/30/20 1428  BP: 138/79  Pulse: 70  Resp: 18  Temp: (!) 97 F (36.1 C)  SpO2: 98%  Weight: 125 lb 6.4 oz (56.9 kg)  Height: 6\' 1"  (1.854 m)   Body mass index is 16.54 kg/m. Physical Exam Vitals and nursing note reviewed.  Constitutional:      General: He is not in acute distress.    Appearance: He is underweight. He is not ill-appearing.     Comments: Frail   HENT:     Mouth/Throat:     Mouth: Mucous membranes are moist.     Pharynx: Oropharynx is clear. No oropharyngeal exudate or posterior oropharyngeal erythema.  Eyes:     General: No scleral icterus.       Right eye: No discharge.        Left eye: No discharge.     Conjunctiva/sclera: Conjunctivae normal.     Pupils: Pupils are equal, round, and reactive to light.  Cardiovascular:     Rate and Rhythm: Normal rate and regular rhythm.     Pulses: Normal pulses.      Heart sounds: Normal heart sounds. No murmur heard.  No friction rub. No gallop.   Pulmonary:     Effort: Pulmonary effort is normal. No respiratory distress.     Breath sounds: Normal breath sounds. No wheezing, rhonchi or rales.  Chest:     Chest wall: No tenderness.  Abdominal:     General: Bowel sounds are normal. There is no distension.     Palpations: Abdomen is soft. There is no mass.     Tenderness: There is no abdominal tenderness. There is no right CVA tenderness, left CVA tenderness, guarding or rebound.  Musculoskeletal:        General: No swelling or tenderness.  Comments: Unsteady gait on wheelchair during visit   Skin:    General: Skin is warm and dry.     Coloration: Skin is pale.     Findings: No bruising, erythema or rash.  Neurological:     Mental Status: He is alert.     Cranial Nerves: No cranial nerve deficit.     Motor: No weakness.     Gait: Gait abnormal.     Comments: Alert and oriented x 2  HOH   Psychiatric:        Mood and Affect: Mood normal.        Behavior: Behavior normal.     Labs reviewed: Recent Labs    09/01/19 1645 09/01/19 1645 09/02/19 0422 09/02/19 0422 09/03/19 0523 09/29/19 0000 02/04/20 0000 02/25/20 0000 04/13/20 0000  NA 137   < > 138   < > 137   < > 139 138 134*  K 4.2   < > 4.6   < > 4.4   < > 4.4 4.7 4.9  CL 103   < > 107   < > 104   < > 107 110* 103  CO2 25   < > 21*   < > 24   < > 19 20 22   GLUCOSE 108*  --  101*  --  92  --   --   --   --   BUN 39*   < > 36*   < > 33*   < > 37* 40* 32*  CREATININE 1.61*   < > 1.54*   < > 1.23   < > 1.7* 1.5* 2.0*  CALCIUM 9.0   < > 8.9   < > 9.3   < > 9.6 9.7 9.3   < > = values in this interval not displayed.   Recent Labs    11/25/19 0000 04/13/20 0000  AST 16 19  ALT 8* 13  ALKPHOS 61 57  ALBUMIN 3.7 3.9   Recent Labs    09/01/19 1645 09/01/19 1645 09/02/19 0422 09/02/19 0422 09/03/19 0523 09/29/19 0000 02/25/20 0000 04/13/20 0000 06/30/20 0000  WBC 6.1    < > 7.9   < > 7.5   < > 5.8 6.2 5.2  NEUTROABS 4.0  --   --   --  4.8  --   --   --   --   HGB 6.8*   < > 8.1*   < > 9.6*   < > 8.9* 8.1* 6.9*  HCT 23.0*   < > 27.1*   < > 31.0*   < > 28* 25* 22*  MCV 89.8  --  92.8  --  91.4  --   --   --   --   PLT 247   < > 209   < > 211   < > 151 231 191   < > = values in this interval not displayed.   Lab Results  Component Value Date   TSH 2.21 11/25/2019   Lab Results  Component Value Date   HGBA1C 5.7 02/25/2020   Lab Results  Component Value Date   CHOL 99 11/25/2019   HDL 50 11/25/2019   LDLCALC 42 11/25/2019   TRIG 36 (A) 11/25/2019   CHOLHDL 3.3 03/30/2019    Significant Diagnostic Results in last 30 days:  No results found.  Assessment/Plan  Severe anemia Critical Hgb 6.9 previous was 8.9.Ferritin 54.62 and folic acid 9.4 No signs of bleeding reported.Patient does  not talk much to provide HPI information.  Will send to ED for further evaluation. Orders given to facility Nurse.Patient in agreement to be evaluated in ED.    Family/ staff Communication: Reviewed plan of care with patient and Facility Nurse  Labs/tests ordered:  Send to ED for critical Hgb level 6.9

## 2020-06-30 NOTE — Treatment Plan (Signed)
Called for admission from Dr. Gaylan Gerold for symptomatic anemia for this patient.  Patient was sent from his Geriatric facility due to a lower hemoglobin of 6.9 and upon evaluation here is found that his repeat hemoglobin here was 8.0.  This is within his standard baseline noted for the last year or so.  Currently he is not symptomatic and denies any shortness of breath.  FOBT done was negative and patient denies any melena.  He told the ED physician that he had fallen a week ago and hit his head however this was confirmed to be over a month ago.  Patient was supposed to be typed and screened and transfused 1 unit of PRBCs but with his repeat hemoglobin being higher than what it was when he was seen at the geriatric clinic a blood transfusion is going to be deferred.  And upon speaking with the ED physician patient has been ambulating fine and is at his baseline and was sent over by his facility given concern of his low hemoglobin.  Currently patient does not have any real criteria to meet admission for observation as he is ambulating fine and because his Hgb is around baseline.  Denies any dyspnea, shortness of breath and his hemoglobin is stable with values similar to his prior given his chronic anemia. I cannot really find a justification to admit this patient so admission has been deferred and Flow Manager has been notified of the deferment. Please re-page if any additional concerns.

## 2020-06-30 NOTE — Discharge Instructions (Addendum)
Jose Baldwin, you came to the ED for a low hemoglobin of 6.9.  Your stool check is negative for blood.  We transfused 1 units of blood.  Repeat lab showed improved hemoglobin at 8.9.  Please follow-up with your primary care physician.    Take care

## 2020-06-30 NOTE — ED Provider Notes (Signed)
[facility] DEPT Provider Note   CSN: [id number] Arrival date & time: 06/30/20  1513     History Chief Complaint  Patient presents with  . Weakness    Jose Baldwin is a 86 y.o. male with past medical history of Parkinson, dementia, hypertension who presents to the ED from nursing home for hemoglobin of 6.9.  Patient is alert and oriented to person and place.  He appears in no acute distress.  He denies chest pain, shortness of breath, domino pain, black stool, constipation, diarrhea or urinary symptoms.  Patient states that he has been falling frequently and his most recent and fall was last week.  Patient states that he had a syncopal episode in the hallway and fall and hit the back of his head.  He states that he was unconscious for about a minute.  Patient is not on any blood thinners.  Speaking with staff at [facility] in [place], they states that his fall was actually 1 month ago on 05/25/2020 which he suffer an abrasion on the right knee and a small hematoma on the top of his head.  No loss of consciousness per chart review.  His neuro exam and physical exam were unremarkable at that time.  No head imaging was obtained.  HPI     Past Medical History:  Diagnosis Date  . Alzheimer's dementia ([place])   . Hypertension   . Parkinson disease ([place])   . Prostate cancer ([place])   . Renal disorder   . Syncope     Patient Active Problem List   Diagnosis Date Noted  . Severe anemia 09/01/2019  . Dementia with behavioral disturbance ([place]) 05/30/2019  . Hyperlipidemia 05/30/2019  . Bradycardia 05/22/2019  . Goals of care, counseling/discussion   . Palliative care by specialist   . Normocytic anemia 05/21/2019  . CKD (chronic kidney disease), stage III ([place]) 05/21/2019  . Physical deconditioning 05/21/2019  . Hematuria 05/21/2019  . Hypokalemia 04/20/2019  . Hypomagnesemia 04/20/2019  . Parkinson disease ([place]) 04/20/2019  . Cardiac arrhythmia  04/20/2019  . Protein-calorie malnutrition, severe 03/31/2019  . Cerebral embolism with cerebral infarction 03/30/2019  . POTS (postural orthostatic tachycardia syndrome) 03/28/2019  . HTN (hypertension) 02/04/2018  . Prostate cancer ([place]) 02/04/2018  . Iron deficiency anemia 02/04/2018  . Syncope 02/02/2018    Past Surgical History:  Procedure Laterality Date  . BACK SURGERY    . IR VERTEBROPLASTY CERV/THOR BX INC UNI/BIL INC/INJECT/IMAGING  01/31/2018  . IR VERTEBROPLASTY CERV/THOR BX INC UNI/BIL INC/INJECT/IMAGING  03/24/2019       Family History  Problem Relation Age of Onset  . Heart attack Father 53    Social History   Tobacco Use  . Smoking status: Former Smoker    Packs/day: 2.00    Years: 30.00    Pack years: 60.00    Types: Cigarettes    Quit date: 1990    Years since quitting: 31.8  . Smokeless tobacco: Never Used  Vaping Use  . Vaping Use: Never used  Substance Use Topics  . Alcohol use: Yes    Alcohol/week: 14.0 standard drinks    Types: 14 Cans of beer per week  . Drug use: Never    Home Medications Prior to Admission medications   Medication Sig Start Date End Date Taking? Authorizing Provider  amLODipine (NORVASC) 2.5 MG tablet Take 1 tablet (2.5 mg total) by mouth daily. 05/26/19  Yes Hongalgi, Lenis Dickinson, MD  ascorbic acid (VITAMIN C) 500  MG tablet Take 500 mg by mouth daily.   Yes [provider]  aspirin 81 MG chewable tablet Chew 1 tablet (81 mg total) by mouth daily. 04/01/19  Yes Nita Sells, MD  carbidopa-levodopa (SINEMET IR) 25-250 MG tablet Take 1 tablet by mouth 3 (three) times daily. 9am, 1pm, 7pm   Yes [provider]  Cholecalciferol (D3-50) 1.25 MG (50000 UT) capsule Take 50,000 Units by mouth every 30 (thirty) days.   Yes [provider]  cloNIDine (CATAPRES) 0.2 MG tablet Take 0.2 mg by mouth 3 (three) times daily. 9am, 1pm, 7pm   Yes [provider]  Dextromethorphan-guaiFENesin (ROBITUSSIN  COUGH+CHEST CONG DM PO) Take 20 mLs by mouth 2 (two) times daily as needed (cough).  03/23/20  Yes [provider]  feeding supplement, ENSURE ENLIVE, (ENSURE ENLIVE) LIQD Take 237 mLs by mouth 2 (two) times daily between meals. 05/25/19  Yes Hongalgi, Lenis Dickinson, MD  Multiple Vitamin (MULTIVITAMIN WITH MINERALS) TABS tablet Take 1 tablet by mouth daily. 05/26/19  Yes Hongalgi, Lenis Dickinson, MD  omeprazole (PRILOSEC) 40 MG capsule Take 40 mg by mouth daily.   Yes [provider]  ondansetron (ZOFRAN) 4 MG tablet Take 4 mg by mouth every 6 (six) hours as needed for nausea or vomiting.   Yes [provider]  polyethylene glycol (MIRALAX / GLYCOLAX) 17 g packet Take 17 g by mouth daily as needed for mild constipation. 04/23/19  Yes Aline August, MD  thiamine (VITAMIN B-1) 100 MG tablet Take 100 mg by mouth daily. Take at 1 PM   Yes [provider]    Allergies    Patient has no known allergies.  Review of Systems   Review of Systems  Constitutional: Negative for fatigue.  Respiratory: Negative for chest tightness and shortness of breath.   Cardiovascular: Negative for chest pain.  Gastrointestinal: Negative for abdominal pain, blood in stool, constipation, diarrhea, nausea and vomiting.  Genitourinary: Negative for dysuria.  Neurological: Positive for syncope.    Physical Exam Updated Vital Signs BP (!) 196/92   Pulse 76   Temp 98 F (36.7 C) (Oral)   Resp 14   SpO2 95%   Physical Exam Constitutional:      General: He is not in acute distress. HENT:     Head: Normocephalic.     Comments: Tenderness to palpation on the top of his head.  No abrasions or bruises noted.  No depressed skull. Eyes:     General:        Right eye: No discharge.        Left eye: No discharge.     Comments: Conjunctival pallor  Cardiovascular:     Rate and Rhythm: Normal rate and regular rhythm.  Pulmonary:     Effort: No respiratory distress.     Breath sounds: Normal  breath sounds. No wheezing.  Abdominal:     General: Bowel sounds are normal. There is no distension.     Palpations: Abdomen is soft.     Tenderness: There is no abdominal tenderness. There is no guarding.  Musculoskeletal:        General: No tenderness.     Cervical back: Normal range of motion. No rigidity or tenderness.     Right lower leg: No edema.     Left lower leg: No edema.     Comments: No midline tenderness  Skin:    General: Skin is warm.     Coloration: Skin is not jaundiced.  Neurological:     Mental Status: He is alert.     Comments: PERRLA Cranial nerves grossly no deficit Normal strength and range of motion upper lower extremities  Psychiatric:     Comments: Flat affect     ED Results / Procedures / Treatments   Labs (all labs ordered are listed, but only abnormal results are displayed) Labs Reviewed  CBC - Abnormal; Notable for the following components:      Result Value   RBC 3.47 (*)    Hemoglobin 8.0 (*)    HCT 26.6 (*)    MCV 76.7 (*)    MCH 23.1 (*)    RDW 17.9 (*)    All other components within normal limits  COMPREHENSIVE METABOLIC PANEL - Abnormal; Notable for the following components:   Sodium 131 (*)    CO2 19 (*)    Glucose, Bld 114 (*)    BUN 39 (*)    Creatinine, Ser 2.16 (*)    Calcium 8.8 (*)    GFR, Estimated 29 (*)    All other components within normal limits  HEMOGLOBIN AND HEMATOCRIT, BLOOD - Abnormal; Notable for the following components:   Hemoglobin 8.9 (*)    HCT 27.9 (*)    All other components within normal limits  PROTIME-INR  APTT  POC OCCULT BLOOD, ED  TYPE AND SCREEN  PREPARE RBC (CROSSMATCH)    EKG None  Radiology No results found.  Procedures Procedures (including critical care time)  Medications Ordered in ED Medications  carbidopa-levodopa (SINEMET IR) 25-250 MG per tablet immediate release 1 tablet (has no administration in time range)  cloNIDine (CATAPRES) tablet 0.2 mg (has no administration in  time range)  0.9 %  sodium chloride infusion (Manually program via Guardrails IV Fluids) ( Intravenous Stopped 06/30/20 2251)    ED Course  I have reviewed the triage vital signs and the nursing notes.  Pertinent labs & imaging results that were available during my care of the patient were reviewed by me and considered in my medical decision making (see chart for details).    MDM Rules/Calculators/A&P                          Patient presents to the ED from his nursing home for hemoglobin value of 6.9.  Patient denies chest pain, shortness of breath, abdominal pain, melena or bloody stool.  FOBT was negative.  Repeat CBC shows hemoglobin of 8.  Patient was given 1 unit of blood.  Patient has history of chronic anemia with CKD 3, baseline hemoglobin around 9.  Iron study shows low iron of 20 and ferritin of 19.  Per his geriatrician's note, patient was not on iron therapy due to constipation.  PT INR and PTT were within normal limits.  GI bleeding is low on the suspicion list given negative FOBT.  No records of past colonoscopies in chart review.  Posttransfusion H&H shows hemoglobin back at baseline 8.9.  Patient will be transferred back to his residence.  No indication for admission.  Come back to the ED if significant drop in hemoglobin.  Patient verbalizes understanding.   Final Clinical Impression(s) / ED Diagnoses Final diagnoses:  Severe anemia    Rx / DC Orders ED Discharge Orders    None       Gaylan Gerold, DO 06/30/20 [place], [place], DO 06/30/20 2257

## 2020-06-30 NOTE — ED Notes (Signed)
[facility] called for transport to [facility]

## 2020-06-30 NOTE — ED Triage Notes (Signed)
Facility called EMS stating pain is "weaker than usual" pt has hx of anemia and needing blood transfusions

## 2020-07-01 DIAGNOSIS — M2550 Pain in unspecified joint: Secondary | ICD-10-CM | POA: Diagnosis not present

## 2020-07-01 DIAGNOSIS — Z7401 Bed confinement status: Secondary | ICD-10-CM | POA: Diagnosis not present

## 2020-07-01 DIAGNOSIS — I499 Cardiac arrhythmia, unspecified: Secondary | ICD-10-CM | POA: Diagnosis not present

## 2020-07-01 DIAGNOSIS — Z743 Need for continuous supervision: Secondary | ICD-10-CM | POA: Diagnosis not present

## 2020-07-01 DIAGNOSIS — R6889 Other general symptoms and signs: Secondary | ICD-10-CM | POA: Diagnosis not present

## 2020-07-01 LAB — TYPE AND SCREEN
ABO/RH(D): O POS
Antibody Screen: NEGATIVE
Unit division: 0

## 2020-07-01 LAB — BPAM RBC
Blood Product Expiration Date: 202110282359
ISSUE DATE / TIME: 202110211725
Unit Type and Rh: 9500

## 2020-07-05 DIAGNOSIS — H353231 Exudative age-related macular degeneration, bilateral, with active choroidal neovascularization: Secondary | ICD-10-CM | POA: Diagnosis not present

## 2020-07-05 DIAGNOSIS — H353211 Exudative age-related macular degeneration, right eye, with active choroidal neovascularization: Secondary | ICD-10-CM | POA: Diagnosis not present

## 2020-07-08 DIAGNOSIS — G20 Parkinson's disease: Secondary | ICD-10-CM | POA: Diagnosis not present

## 2020-07-08 DIAGNOSIS — R278 Other lack of coordination: Secondary | ICD-10-CM | POA: Diagnosis not present

## 2020-07-08 DIAGNOSIS — R262 Difficulty in walking, not elsewhere classified: Secondary | ICD-10-CM | POA: Diagnosis not present

## 2020-07-11 DIAGNOSIS — G20 Parkinson's disease: Secondary | ICD-10-CM | POA: Diagnosis not present

## 2020-07-11 DIAGNOSIS — M6281 Muscle weakness (generalized): Secondary | ICD-10-CM | POA: Diagnosis not present

## 2020-07-11 DIAGNOSIS — R262 Difficulty in walking, not elsewhere classified: Secondary | ICD-10-CM | POA: Diagnosis not present

## 2020-07-11 DIAGNOSIS — R278 Other lack of coordination: Secondary | ICD-10-CM | POA: Diagnosis not present

## 2020-07-11 DIAGNOSIS — I129 Hypertensive chronic kidney disease with stage 1 through stage 4 chronic kidney disease, or unspecified chronic kidney disease: Secondary | ICD-10-CM | POA: Diagnosis not present

## 2020-07-12 DIAGNOSIS — M6281 Muscle weakness (generalized): Secondary | ICD-10-CM | POA: Diagnosis not present

## 2020-07-12 DIAGNOSIS — R262 Difficulty in walking, not elsewhere classified: Secondary | ICD-10-CM | POA: Diagnosis not present

## 2020-07-12 DIAGNOSIS — I129 Hypertensive chronic kidney disease with stage 1 through stage 4 chronic kidney disease, or unspecified chronic kidney disease: Secondary | ICD-10-CM | POA: Diagnosis not present

## 2020-07-12 DIAGNOSIS — G20 Parkinson's disease: Secondary | ICD-10-CM | POA: Diagnosis not present

## 2020-07-12 DIAGNOSIS — R278 Other lack of coordination: Secondary | ICD-10-CM | POA: Diagnosis not present

## 2020-07-13 DIAGNOSIS — R262 Difficulty in walking, not elsewhere classified: Secondary | ICD-10-CM | POA: Diagnosis not present

## 2020-07-13 DIAGNOSIS — I129 Hypertensive chronic kidney disease with stage 1 through stage 4 chronic kidney disease, or unspecified chronic kidney disease: Secondary | ICD-10-CM | POA: Diagnosis not present

## 2020-07-13 DIAGNOSIS — G20 Parkinson's disease: Secondary | ICD-10-CM | POA: Diagnosis not present

## 2020-07-13 DIAGNOSIS — M6281 Muscle weakness (generalized): Secondary | ICD-10-CM | POA: Diagnosis not present

## 2020-07-13 DIAGNOSIS — R278 Other lack of coordination: Secondary | ICD-10-CM | POA: Diagnosis not present

## 2020-07-14 DIAGNOSIS — R278 Other lack of coordination: Secondary | ICD-10-CM | POA: Diagnosis not present

## 2020-07-14 DIAGNOSIS — R262 Difficulty in walking, not elsewhere classified: Secondary | ICD-10-CM | POA: Diagnosis not present

## 2020-07-14 DIAGNOSIS — G20 Parkinson's disease: Secondary | ICD-10-CM | POA: Diagnosis not present

## 2020-07-14 DIAGNOSIS — I129 Hypertensive chronic kidney disease with stage 1 through stage 4 chronic kidney disease, or unspecified chronic kidney disease: Secondary | ICD-10-CM | POA: Diagnosis not present

## 2020-07-14 DIAGNOSIS — M6281 Muscle weakness (generalized): Secondary | ICD-10-CM | POA: Diagnosis not present

## 2020-07-15 DIAGNOSIS — I129 Hypertensive chronic kidney disease with stage 1 through stage 4 chronic kidney disease, or unspecified chronic kidney disease: Secondary | ICD-10-CM | POA: Diagnosis not present

## 2020-07-15 DIAGNOSIS — R278 Other lack of coordination: Secondary | ICD-10-CM | POA: Diagnosis not present

## 2020-07-15 DIAGNOSIS — M6281 Muscle weakness (generalized): Secondary | ICD-10-CM | POA: Diagnosis not present

## 2020-07-15 DIAGNOSIS — R262 Difficulty in walking, not elsewhere classified: Secondary | ICD-10-CM | POA: Diagnosis not present

## 2020-07-15 DIAGNOSIS — G20 Parkinson's disease: Secondary | ICD-10-CM | POA: Diagnosis not present

## 2020-07-18 DIAGNOSIS — I129 Hypertensive chronic kidney disease with stage 1 through stage 4 chronic kidney disease, or unspecified chronic kidney disease: Secondary | ICD-10-CM | POA: Diagnosis not present

## 2020-07-18 DIAGNOSIS — G20 Parkinson's disease: Secondary | ICD-10-CM | POA: Diagnosis not present

## 2020-07-18 DIAGNOSIS — R262 Difficulty in walking, not elsewhere classified: Secondary | ICD-10-CM | POA: Diagnosis not present

## 2020-07-18 DIAGNOSIS — M6281 Muscle weakness (generalized): Secondary | ICD-10-CM | POA: Diagnosis not present

## 2020-07-18 DIAGNOSIS — R278 Other lack of coordination: Secondary | ICD-10-CM | POA: Diagnosis not present

## 2020-07-19 DIAGNOSIS — I129 Hypertensive chronic kidney disease with stage 1 through stage 4 chronic kidney disease, or unspecified chronic kidney disease: Secondary | ICD-10-CM | POA: Diagnosis not present

## 2020-07-19 DIAGNOSIS — M6281 Muscle weakness (generalized): Secondary | ICD-10-CM | POA: Diagnosis not present

## 2020-07-19 DIAGNOSIS — R262 Difficulty in walking, not elsewhere classified: Secondary | ICD-10-CM | POA: Diagnosis not present

## 2020-07-19 DIAGNOSIS — G20 Parkinson's disease: Secondary | ICD-10-CM | POA: Diagnosis not present

## 2020-07-19 DIAGNOSIS — R278 Other lack of coordination: Secondary | ICD-10-CM | POA: Diagnosis not present

## 2020-07-20 DIAGNOSIS — M6281 Muscle weakness (generalized): Secondary | ICD-10-CM | POA: Diagnosis not present

## 2020-07-20 DIAGNOSIS — R278 Other lack of coordination: Secondary | ICD-10-CM | POA: Diagnosis not present

## 2020-07-20 DIAGNOSIS — G20 Parkinson's disease: Secondary | ICD-10-CM | POA: Diagnosis not present

## 2020-07-20 DIAGNOSIS — I129 Hypertensive chronic kidney disease with stage 1 through stage 4 chronic kidney disease, or unspecified chronic kidney disease: Secondary | ICD-10-CM | POA: Diagnosis not present

## 2020-07-20 DIAGNOSIS — R262 Difficulty in walking, not elsewhere classified: Secondary | ICD-10-CM | POA: Diagnosis not present

## 2020-07-21 DIAGNOSIS — R278 Other lack of coordination: Secondary | ICD-10-CM | POA: Diagnosis not present

## 2020-07-21 DIAGNOSIS — I129 Hypertensive chronic kidney disease with stage 1 through stage 4 chronic kidney disease, or unspecified chronic kidney disease: Secondary | ICD-10-CM | POA: Diagnosis not present

## 2020-07-21 DIAGNOSIS — G20 Parkinson's disease: Secondary | ICD-10-CM | POA: Diagnosis not present

## 2020-07-21 DIAGNOSIS — R262 Difficulty in walking, not elsewhere classified: Secondary | ICD-10-CM | POA: Diagnosis not present

## 2020-07-21 DIAGNOSIS — M6281 Muscle weakness (generalized): Secondary | ICD-10-CM | POA: Diagnosis not present

## 2020-07-22 DIAGNOSIS — R262 Difficulty in walking, not elsewhere classified: Secondary | ICD-10-CM | POA: Diagnosis not present

## 2020-07-22 DIAGNOSIS — M6281 Muscle weakness (generalized): Secondary | ICD-10-CM | POA: Diagnosis not present

## 2020-07-22 DIAGNOSIS — I129 Hypertensive chronic kidney disease with stage 1 through stage 4 chronic kidney disease, or unspecified chronic kidney disease: Secondary | ICD-10-CM | POA: Diagnosis not present

## 2020-07-22 DIAGNOSIS — G20 Parkinson's disease: Secondary | ICD-10-CM | POA: Diagnosis not present

## 2020-07-22 DIAGNOSIS — R278 Other lack of coordination: Secondary | ICD-10-CM | POA: Diagnosis not present

## 2020-07-25 DIAGNOSIS — R278 Other lack of coordination: Secondary | ICD-10-CM | POA: Diagnosis not present

## 2020-07-25 DIAGNOSIS — R262 Difficulty in walking, not elsewhere classified: Secondary | ICD-10-CM | POA: Diagnosis not present

## 2020-07-25 DIAGNOSIS — I129 Hypertensive chronic kidney disease with stage 1 through stage 4 chronic kidney disease, or unspecified chronic kidney disease: Secondary | ICD-10-CM | POA: Diagnosis not present

## 2020-07-25 DIAGNOSIS — M6281 Muscle weakness (generalized): Secondary | ICD-10-CM | POA: Diagnosis not present

## 2020-07-25 DIAGNOSIS — G20 Parkinson's disease: Secondary | ICD-10-CM | POA: Diagnosis not present

## 2020-07-25 DIAGNOSIS — Z03818 Encounter for observation for suspected exposure to other biological agents ruled out: Secondary | ICD-10-CM | POA: Diagnosis not present

## 2020-08-03 ENCOUNTER — Encounter: Payer: Self-pay | Admitting: Orthopedic Surgery

## 2020-08-03 ENCOUNTER — Non-Acute Institutional Stay (SKILLED_NURSING_FACILITY): Payer: Medicare Other | Admitting: Orthopedic Surgery

## 2020-08-03 DIAGNOSIS — N183 Chronic kidney disease, stage 3 unspecified: Secondary | ICD-10-CM

## 2020-08-03 DIAGNOSIS — E43 Unspecified severe protein-calorie malnutrition: Secondary | ICD-10-CM | POA: Diagnosis not present

## 2020-08-03 DIAGNOSIS — U071 COVID-19: Secondary | ICD-10-CM

## 2020-08-03 DIAGNOSIS — I10 Essential (primary) hypertension: Secondary | ICD-10-CM

## 2020-08-03 DIAGNOSIS — G20 Parkinson's disease: Secondary | ICD-10-CM

## 2020-08-03 DIAGNOSIS — D649 Anemia, unspecified: Secondary | ICD-10-CM | POA: Diagnosis not present

## 2020-08-03 DIAGNOSIS — F0281 Dementia in other diseases classified elsewhere with behavioral disturbance: Secondary | ICD-10-CM

## 2020-08-03 NOTE — Progress Notes (Signed)
Location:    [place] Room Number: 425/W Place of Service:  SNF [phone]) Provider:  Joyanna Kleman AGNP-C  Gayland Curry, DO  Patient Care Team: Gayland Curry, DO as PCP - General (Geriatric Medicine) Rehab, [facility] ([place])  Extended Emergency Contact Information Primary Emergency Contact: Orpah Melter Address: [place], [place] [address] [name] of [place] Phone: [phone] Mobile Phone: [phone] Relation: Son  Code Status:  DNR Goals of care: Advanced Directive information Advanced Directives 08/03/2020  Does Patient Have a Medical Advance Directive? Yes  Type of Advance Directive Out of facility DNR (pink MOST or yellow form);Healthcare Power of Attorney  Does patient want to make changes to medical advance directive? No - Patient declined  Copy of [place] in Chart? Yes - validated most recent copy scanned in chart (See row information)  Would patient like information on creating a medical advance directive? -  Pre-existing out of facility DNR order (yellow form or pink MOST form) Yellow form placed in chart (order not valid for inpatient use)     Chief Complaint  Patient presents with  . Medical Management of Chronic Issues    Routine Visit of Medical Management  . Immunizations    PCV 13    HPI:  Pt is a 86 y.o. male seen today for medical management of chronic diseases.    He is a resident of [facility], seen today at [facility]. PMH includes: hypertension, postural orthostatic tachycardia syndrome, cerebral embolism with cerebral infarction, cardiac arrhythmia, parkinson's, dementia with behavioral disturbance, kidney disease stage 3 and malnutrition. In the past 2 weeks he tested positive for covid-19 and was placed on isolation/droplet precautions. He is mostly asymptomatic except for increased cough. Facility nurse states  he will remain on isolation for an additional week until cough resolves to baseline. Today breathing is unlabored.    Facility nurse is concerned about low afternoon blood pressures.  He is taking clonidine three times daily for blood pressure management. Afternoon blood pressures are as follows:   11/22- 108/71  11/21- 102/64  11/20- 98/68 She is requesting to clonidine be twice daily.   About a month ago he suffered form severe anemia and was taken to [facility] Emergency department. He was given 1 unit PRBC for low hemoglobin. He still complains of fatigue at this time. Facility nurse states he sleep a lot, but does not know if it is due to recent covid infection.   He is able to follow simple commands. Very soft spoken when asked a question. Gives short answers like "yes" or "no." He is able to feed himself. Requires 1-person assist for ADL's and transfers. PT and OT have signed off at this time. On 11/9 he was found on the floor in his room, no injury had occurred. Prior to covid-19 he would forget to use call bell to ask for help and get up on his own.   On 10/26 he was seen by Dr. Jule Ser for his macular degeneration with exudate production. Facility nurse was advised to use artificial tears for 5 days and do lid scrubs BID to promote eye hygiene. Today, his eyes are absent of exudate. He denies that his eyes bother him.   He has maintained his weight within the last 2 months. Appetite is poor. Remains on pureed diet.  Facility nurse has no complaints except to adjust clonidine.    Past Medical History:  Diagnosis Date  . Alzheimer's dementia ([place])   . Hypertension   . Parkinson disease ([place])   . Prostate cancer ([place])   . Renal disorder   . Syncope    Past Surgical History:  Procedure Laterality Date  . BACK SURGERY    . IR VERTEBROPLASTY CERV/THOR BX INC UNI/BIL INC/INJECT/IMAGING  01/31/2018  . IR VERTEBROPLASTY CERV/THOR BX INC UNI/BIL INC/INJECT/IMAGING  03/24/2019    No  Known Allergies  Allergies as of 08/03/2020   No Known Allergies     Medication List       Accurate as of August 03, 2020 11:16 AM. If you have any questions, ask your nurse or doctor.        amLODipine 2.5 MG tablet Commonly known as: NORVASC Take 1 tablet (2.5 mg total) by mouth daily.   ARTIFICIAL TEARS OP Place 1 drop into both eyes 4 (four) times daily. For Dry Eyes   ascorbic acid 500 MG tablet Commonly known as: VITAMIN C Take 500 mg by mouth daily.   aspirin 81 MG chewable tablet Chew 1 tablet (81 mg total) by mouth daily.   BABY SHAMPOO EX Apply topically. Baby shampo ( less than a dime size ) with warm water can be use for eye hygiene   carbidopa-levodopa 25-250 MG tablet Commonly known as: SINEMET IR Take 1 tablet by mouth 3 (three) times daily. 9am, 1pm, 7pm   cloNIDine 0.2 MG tablet Commonly known as: CATAPRES Take 0.2 mg by mouth 3 (three) times daily. 9am, 1pm, 7pm   D3-50 1.25 MG (50000 UT) capsule Generic drug: Cholecalciferol Take 50,000 Units by mouth every 30 (thirty) days.   feeding supplement Liqd Take 237 mLs by mouth 2 (two) times daily between meals.   multivitamin with minerals Tabs tablet Take 1 tablet by mouth daily.   OcuSoft Lid Scrub Plus Pads Apply topically. Cleanse both eyelid, includes eyebrows and forehead twice daily to maintain good eye hygiene   omeprazole 40 MG capsule Commonly known as: PRILOSEC Take 40 mg by mouth daily.   ondansetron 4 MG tablet Commonly known as: ZOFRAN Take 4 mg by mouth every 6 (six) hours as needed for nausea or vomiting.   polyethylene glycol 17 g packet Commonly known as: MIRALAX / GLYCOLAX Take 17 g by mouth daily as needed for mild constipation.   ROBITUSSIN COUGH+CHEST CONG DM PO Take 20 mLs by mouth 2 (two) times daily as needed (cough).   thiamine 100 MG tablet Commonly known as: Vitamin B-1 Take 100 mg by mouth daily. Take at 1 PM       Review of Systems  Constitutional:  Positive for fatigue. Negative for unexpected weight change.  HENT: Positive for trouble swallowing.   Eyes:       Eye exudate, retinal disease  Respiratory: Positive for cough. Negative for shortness of breath and wheezing.   Cardiovascular: Negative for chest pain and leg swelling.  Gastrointestinal: Negative for constipation.  Genitourinary: Negative for hematuria.       Incontinent  Musculoskeletal: Positive for arthralgias and myalgias.  Skin:       Dry skin  Neurological: Positive for tremors, speech difficulty and weakness. Negative for dizziness and light-headedness.  Psychiatric/Behavioral: Positive for confusion. Negative for dysphoric mood. The patient is not nervous/anxious.     Immunization History  Administered Date(s) Administered  . Fluad Quad(high Dose 65+) 05/23/2019  . Influenza, High  Dose Seasonal PF 06/26/2017, 06/17/2018  . Influenza-Unspecified 06/22/2016, 06/26/2017  . Moderna SARS-COVID-2 Vaccination 10/08/2019, 11/05/2019  . Pneumococcal Polysaccharide-23 01/07/2015, 05/11/2017  . Zoster Recombinat (Shingrix) 12/11/2017, 03/18/2018   Pertinent  Health Maintenance Due  Topic Date Due  . PNA vac Low Risk Adult (2 of 2 - PCV13) 05/11/2018  . INFLUENZA VACCINE  12/08/2020 (Originally 04/10/2020)   Fall Risk  01/27/2020  Risk for fall due to : History of fall(s);Impaired balance/gait;Impaired mobility;Medication side effect;Impaired vision;Mental status change  Follow up Falls evaluation completed;Education provided;Falls prevention discussed   Functional Status Survey:    Vitals:   08/03/20 1106  BP: 130/78  Pulse: 68  Resp: 18  Temp: (!) 97.5 F (36.4 C)  SpO2: 98%  Weight: 127 lb 3.2 oz (57.7 kg)  Height: 6\' 1"  (1.854 m)   Body mass index is 16.78 kg/m. Physical Exam Vitals reviewed.  Constitutional:      General: He is not in acute distress.    Appearance: He is ill-appearing.  HENT:     Head: Normocephalic.     Right Ear: There is no  impacted cerumen.     Left Ear: There is no impacted cerumen.     Nose: Nose normal.     Mouth/Throat:     Mouth: Mucous membranes are moist.     Pharynx: No posterior oropharyngeal erythema.  Eyes:     General:        Right eye: No discharge.        Left eye: No discharge.  Cardiovascular:     Rate and Rhythm: Normal rate and regular rhythm.     Pulses: Normal pulses.     Heart sounds: Normal heart sounds. No murmur heard.   Pulmonary:     Effort: Pulmonary effort is normal.     Breath sounds: Rhonchi present.  Abdominal:     General: Abdomen is flat. Bowel sounds are normal. There is no distension.     Palpations: Abdomen is soft.     Tenderness: There is no abdominal tenderness.  Musculoskeletal:     Right lower leg: No edema.     Left lower leg: No edema.  Skin:    General: Skin is warm and dry.     Capillary Refill: Capillary refill takes less than 2 seconds.  Neurological:     Mental Status: He is alert.     Motor: Weakness present.     Coordination: Coordination abnormal.     Gait: Gait abnormal.  Psychiatric:        Mood and Affect: Mood normal.        Speech: Speech is delayed and slurred.        Behavior: Behavior normal.        Thought Content: Thought content normal.        Cognition and Memory: Memory is impaired.        Judgment: Judgment normal.     Labs reviewed: Recent Labs    09/02/19 0422 09/02/19 0422 09/03/19 0523 09/29/19 0000 02/25/20 0000 04/13/20 0000 06/30/20 1549  NA 138   < > 137   < > 138 134* 131*  K 4.6   < > 4.4   < > 4.7 4.9 4.6  CL 107   < > 104   < > 110* 103 102  CO2 21*   < > 24   < > 20 22 19*  GLUCOSE 101*  --  92  --   --   --  114*  BUN 36*   < > 33*   < > 40* 32* 39*  CREATININE 1.54*   < > 1.23   < > 1.5* 2.0* 2.16*  CALCIUM 8.9   < > 9.3   < > 9.7 9.3 8.8*   < > = values in this interval not displayed.   Recent Labs    11/25/19 0000 04/13/20 0000 06/30/20 1549  AST 16 19 25   ALT 8* 13 10  ALKPHOS 61 57  54  BILITOT  --   --  0.4  PROT  --   --  6.7  ALBUMIN 3.7 3.9 4.0   Recent Labs    09/01/19 1645 09/01/19 1645 09/02/19 0422 09/02/19 0422 09/03/19 0523 09/29/19 0000 04/13/20 0000 04/13/20 0000 06/30/20 0000 06/30/20 1549 06/30/20 2203  WBC 6.1   < > 7.9   < > 7.5   < > 6.2  --  5.2 7.2  --   NEUTROABS 4.0  --   --   --  4.8  --   --   --   --   --   --   HGB 6.8*   < > 8.1*   < > 9.6*   < > 8.1*   < > 6.9* 8.0* 8.9*  HCT 23.0*   < > 27.1*   < > 31.0*   < > 25*   < > 22* 26.6* 27.9*  MCV 89.8   < > 92.8  --  91.4  --   --   --   --  76.7*  --   PLT 247   < > 209   < > 211   < > 231  --  191 234  --    < > = values in this interval not displayed.   Lab Results  Component Value Date   TSH 2.21 11/25/2019   Lab Results  Component Value Date   HGBA1C 5.7 02/25/2020   Lab Results  Component Value Date   CHOL 99 11/25/2019   HDL 50 11/25/2019   LDLCALC 42 11/25/2019   TRIG 36 (A) 11/25/2019   CHOLHDL 3.3 03/30/2019    Significant Diagnostic Results in last 30 days:  No results found.  Assessment/Plan 1. Parkinson disease ([place]) - stable, continue carbidopa- levodopa 25-250 mg TID - high falls risk due to memory impairment and parkinson's - continue falls safety plan  2. Dementia due to Parkinson's disease with behavioral disturbance ([place]) - stable at this time - 1 person assist with ADL's, can feed himself - continue SNF support  3. Essential hypertension - afternoon blood pressure has trended down within the past week.  - will reduce clonidine 0.2 mg to BID - continue amlodipine regimen - recheck hemoglobin 11/26  4. Stage 3 chronic kidney disease, unspecified whether stage 3a or 3b CKD ([place]) - continue to avoid nephrotoxic drugs and dose adjust for renal clearance - encourage hydration  5. Severe anemia - no signs of bleeding at this time - recheck hemoglobin 11/26  6. Protein-calorie malnutrition, severe ([place]) - ongoing, has maintained weight  within past 2 month - continue ensure between meals  7. COVID-19 - stable at this time with minor cough - continue isolation/droplet precautions per facility - recommend booster vaccine in 2 months     Family/ staff Communication:  Plan discussed with facility nurse  Labs/tests ordered:  Hemoglobin 11/26

## 2020-08-05 DIAGNOSIS — I129 Hypertensive chronic kidney disease with stage 1 through stage 4 chronic kidney disease, or unspecified chronic kidney disease: Secondary | ICD-10-CM | POA: Diagnosis not present

## 2020-08-05 DIAGNOSIS — D509 Iron deficiency anemia, unspecified: Secondary | ICD-10-CM | POA: Diagnosis not present

## 2020-08-05 LAB — CBC AND DIFFERENTIAL
HCT: 32 — AB (ref 41–53)
Hemoglobin: 10.6 — AB (ref 13.5–17.5)

## 2020-08-15 DIAGNOSIS — H353231 Exudative age-related macular degeneration, bilateral, with active choroidal neovascularization: Secondary | ICD-10-CM | POA: Diagnosis not present

## 2020-08-18 DIAGNOSIS — R2681 Unsteadiness on feet: Secondary | ICD-10-CM | POA: Diagnosis not present

## 2020-08-18 DIAGNOSIS — M6281 Muscle weakness (generalized): Secondary | ICD-10-CM | POA: Diagnosis not present

## 2020-08-18 DIAGNOSIS — G20 Parkinson's disease: Secondary | ICD-10-CM | POA: Diagnosis not present

## 2020-08-19 DIAGNOSIS — R2681 Unsteadiness on feet: Secondary | ICD-10-CM | POA: Diagnosis not present

## 2020-08-19 DIAGNOSIS — G20 Parkinson's disease: Secondary | ICD-10-CM | POA: Diagnosis not present

## 2020-08-19 DIAGNOSIS — M6281 Muscle weakness (generalized): Secondary | ICD-10-CM | POA: Diagnosis not present

## 2020-08-22 DIAGNOSIS — M6281 Muscle weakness (generalized): Secondary | ICD-10-CM | POA: Diagnosis not present

## 2020-08-22 DIAGNOSIS — G20 Parkinson's disease: Secondary | ICD-10-CM | POA: Diagnosis not present

## 2020-08-22 DIAGNOSIS — I10 Essential (primary) hypertension: Secondary | ICD-10-CM | POA: Diagnosis not present

## 2020-08-22 DIAGNOSIS — R2681 Unsteadiness on feet: Secondary | ICD-10-CM | POA: Diagnosis not present

## 2020-08-22 LAB — CBC: RBC: 3.76 — AB (ref 3.87–5.11)

## 2020-08-22 LAB — CBC AND DIFFERENTIAL
HCT: 29 — AB (ref 41–53)
Hemoglobin: 9.3 — AB (ref 13.5–17.5)
Platelets: 195 (ref 150–399)
WBC: 8

## 2020-08-23 DIAGNOSIS — R2681 Unsteadiness on feet: Secondary | ICD-10-CM | POA: Diagnosis not present

## 2020-08-23 DIAGNOSIS — G20 Parkinson's disease: Secondary | ICD-10-CM | POA: Diagnosis not present

## 2020-08-23 DIAGNOSIS — M6281 Muscle weakness (generalized): Secondary | ICD-10-CM | POA: Diagnosis not present

## 2020-08-24 DIAGNOSIS — M6281 Muscle weakness (generalized): Secondary | ICD-10-CM | POA: Diagnosis not present

## 2020-08-24 DIAGNOSIS — R2681 Unsteadiness on feet: Secondary | ICD-10-CM | POA: Diagnosis not present

## 2020-08-24 DIAGNOSIS — G20 Parkinson's disease: Secondary | ICD-10-CM | POA: Diagnosis not present

## 2020-08-25 DIAGNOSIS — R2681 Unsteadiness on feet: Secondary | ICD-10-CM | POA: Diagnosis not present

## 2020-08-25 DIAGNOSIS — M6281 Muscle weakness (generalized): Secondary | ICD-10-CM | POA: Diagnosis not present

## 2020-08-25 DIAGNOSIS — G20 Parkinson's disease: Secondary | ICD-10-CM | POA: Diagnosis not present

## 2020-08-26 DIAGNOSIS — M6281 Muscle weakness (generalized): Secondary | ICD-10-CM | POA: Diagnosis not present

## 2020-08-26 DIAGNOSIS — R2681 Unsteadiness on feet: Secondary | ICD-10-CM | POA: Diagnosis not present

## 2020-08-26 DIAGNOSIS — G20 Parkinson's disease: Secondary | ICD-10-CM | POA: Diagnosis not present

## 2020-08-29 DIAGNOSIS — M6281 Muscle weakness (generalized): Secondary | ICD-10-CM | POA: Diagnosis not present

## 2020-08-29 DIAGNOSIS — G20 Parkinson's disease: Secondary | ICD-10-CM | POA: Diagnosis not present

## 2020-08-29 DIAGNOSIS — R2681 Unsteadiness on feet: Secondary | ICD-10-CM | POA: Diagnosis not present

## 2020-08-30 DIAGNOSIS — R2681 Unsteadiness on feet: Secondary | ICD-10-CM | POA: Diagnosis not present

## 2020-08-30 DIAGNOSIS — G20 Parkinson's disease: Secondary | ICD-10-CM | POA: Diagnosis not present

## 2020-08-30 DIAGNOSIS — M6281 Muscle weakness (generalized): Secondary | ICD-10-CM | POA: Diagnosis not present

## 2020-08-31 DIAGNOSIS — G20 Parkinson's disease: Secondary | ICD-10-CM | POA: Diagnosis not present

## 2020-08-31 DIAGNOSIS — M6281 Muscle weakness (generalized): Secondary | ICD-10-CM | POA: Diagnosis not present

## 2020-08-31 DIAGNOSIS — R2681 Unsteadiness on feet: Secondary | ICD-10-CM | POA: Diagnosis not present

## 2020-09-01 DIAGNOSIS — G20 Parkinson's disease: Secondary | ICD-10-CM | POA: Diagnosis not present

## 2020-09-01 DIAGNOSIS — R2681 Unsteadiness on feet: Secondary | ICD-10-CM | POA: Diagnosis not present

## 2020-09-01 DIAGNOSIS — M6281 Muscle weakness (generalized): Secondary | ICD-10-CM | POA: Diagnosis not present

## 2020-09-02 DIAGNOSIS — R2681 Unsteadiness on feet: Secondary | ICD-10-CM | POA: Diagnosis not present

## 2020-09-02 DIAGNOSIS — G20 Parkinson's disease: Secondary | ICD-10-CM | POA: Diagnosis not present

## 2020-09-02 DIAGNOSIS — M6281 Muscle weakness (generalized): Secondary | ICD-10-CM | POA: Diagnosis not present

## 2020-09-05 ENCOUNTER — Encounter: Payer: Self-pay | Admitting: Orthopedic Surgery

## 2020-09-05 ENCOUNTER — Non-Acute Institutional Stay (SKILLED_NURSING_FACILITY): Payer: Medicare Other | Admitting: Orthopedic Surgery

## 2020-09-05 DIAGNOSIS — D649 Anemia, unspecified: Secondary | ICD-10-CM | POA: Diagnosis not present

## 2020-09-05 DIAGNOSIS — F02818 Dementia in other diseases classified elsewhere, unspecified severity, with other behavioral disturbance: Secondary | ICD-10-CM

## 2020-09-05 DIAGNOSIS — K219 Gastro-esophageal reflux disease without esophagitis: Secondary | ICD-10-CM

## 2020-09-05 DIAGNOSIS — I10 Essential (primary) hypertension: Secondary | ICD-10-CM | POA: Diagnosis not present

## 2020-09-05 DIAGNOSIS — G20 Parkinson's disease: Secondary | ICD-10-CM | POA: Diagnosis not present

## 2020-09-05 DIAGNOSIS — N183 Chronic kidney disease, stage 3 unspecified: Secondary | ICD-10-CM

## 2020-09-05 DIAGNOSIS — F0281 Dementia in other diseases classified elsewhere with behavioral disturbance: Secondary | ICD-10-CM

## 2020-09-05 DIAGNOSIS — E43 Unspecified severe protein-calorie malnutrition: Secondary | ICD-10-CM | POA: Diagnosis not present

## 2020-09-05 NOTE — Progress Notes (Signed)
Location:   [place] of Service:   [facility] Provider:  Windell Moulding, AGNP-C      Bowie Mariea Clonts, D.O., C.M.D.    Patient Care Team: Gayland Curry, DO as PCP - General (Geriatric Medicine) Rehab, [facility] ([place])  Extended Emergency Contact Information Primary Emergency Contact: Orpah Melter Address: [address]          [place], [place] [address] [name] of [place] Phone: [phone] Mobile Phone: [phone] Relation: Son  Goals of care: Advanced Directive information Advanced Directives 08/03/2020  Does Patient Have a Medical Advance Directive? Yes  Type of Advance Directive Out of facility DNR (pink MOST or yellow form);Healthcare Power of Attorney  Does patient want to make changes to medical advance directive? No - Patient declined  Copy of [place] in Chart? Yes - validated most recent copy scanned in chart (See row information)  Would patient like information on creating a medical advance directive? -  Pre-existing out of facility DNR order (yellow form or pink MOST form) Yellow form placed in chart (order not valid for inpatient use)     No chief complaint on file.   HPI:  Pt is a 86 y.o. male seen today for medical management of chronic diseases.    He is a resident of [facility], seen at bedside today. PMH includes: hypertension, postural orthostatic tachycardia syndrome, cerebral embolism with cerebral infarction, parkinson's, dementia with behavioral disturbance, prostate cancer, chronic kidney disease stage III, and anemia.   Today he is laying in bed during encounter. He is alert to self and person. Nonverbal during today's encounter, but has been able to verbally state needs in the past. Follows simple commands today during exam. When asked if he was in pain, he shook his head left and right indicating  no. When asked if he was thirsty he shook his head up and down to indicate yes.   12/6 he was taken off covid-19 isolation and brought back to his room. He was receiving PT services since 12/9. PT discontinued 12/24. Remains moderate assist with ADL's and transfers. Ambulates about 50 ft. Uses front wheeled walker with supervision and wheelchair assistance. Able to feed self.   On 12/18 he was found by nursing staff on the floor. He had a skin tear to his left palm. Vitals stable, neuro checks initiated and normal. Skin treated with xerofoam and covered with foam dressing.   Recorded blood pressures are as follows:   12/27- 141/66  12/26- 137/72  12/25- 145/71  Recorded weights are as follows:  10/15- 125.4 lbs  11/09- 127.2 lbs  12/15- 124.2 lbs  No reported behavioral outbursts or hallucinations.  Facility nurse does not report any concerns, vitals stable.     Past Medical History:  Diagnosis Date  . Alzheimer's dementia ([place])   . Hypertension   . Parkinson disease ([place])   . Prostate cancer ([place])   . Renal disorder   . Syncope    Past Surgical History:  Procedure Laterality Date  . BACK SURGERY    . IR VERTEBROPLASTY CERV/THOR BX INC UNI/BIL INC/INJECT/IMAGING  01/31/2018  . IR VERTEBROPLASTY CERV/THOR BX INC UNI/BIL INC/INJECT/IMAGING  03/24/2019    No Known Allergies  Outpatient Encounter Medications as of 09/05/2020  Medication Sig  . amLODipine (NORVASC) 2.5 MG tablet Take 1 tablet (2.5 mg total) by mouth daily.  Marland Kitchen  ascorbic acid (VITAMIN C) 500 MG tablet Take 500 mg by mouth daily.  Marland Kitchen aspirin 81 MG chewable tablet Chew 1 tablet (81 mg total) by mouth daily.  . carbidopa-levodopa (SINEMET IR) 25-250 MG tablet Take 1 tablet by mouth 3 (three) times daily. 9am, 1pm, 7pm  . Carboxymethylcellulose Sodium (ARTIFICIAL TEARS OP) Place 1 drop into both eyes 4 (four) times daily. For Dry Eyes  . Cholecalciferol (D3-50) 1.25 MG (50000 UT) capsule Take 50,000 Units by mouth  every 30 (thirty) days.  . cloNIDine (CATAPRES) 0.2 MG tablet Take 0.2 mg by mouth 3 (three) times daily. 9am, 1pm, 7pm  . Dextromethorphan-guaiFENesin (ROBITUSSIN COUGH+CHEST CONG DM PO) Take 20 mLs by mouth 2 (two) times daily as needed (cough).   . Eyelid Cleansers (OCUSOFT LID SCRUB PLUS) PADS Apply topically. Cleanse both eyelid, includes eyebrows and forehead twice daily to maintain good eye hygiene  . feeding supplement, ENSURE ENLIVE, (ENSURE ENLIVE) LIQD Take 237 mLs by mouth 2 (two) times daily between meals.  . Infant Care Products (BABY SHAMPOO EX) Apply topically. Baby shampo ( less than a dime size ) with warm water can be use for eye hygiene  . Multiple Vitamin (MULTIVITAMIN WITH MINERALS) TABS tablet Take 1 tablet by mouth daily.  Marland Kitchen omeprazole (PRILOSEC) 40 MG capsule Take 40 mg by mouth daily.  . ondansetron (ZOFRAN) 4 MG tablet Take 4 mg by mouth every 6 (six) hours as needed for nausea or vomiting.  . polyethylene glycol (MIRALAX / GLYCOLAX) 17 g packet Take 17 g by mouth daily as needed for mild constipation.  . thiamine (VITAMIN B-1) 100 MG tablet Take 100 mg by mouth daily. Take at 1 PM   No facility-administered encounter medications on file as of 09/05/2020.    Review of Systems  Unable to perform ROS: Patient nonverbal    Immunization History  Administered Date(s) Administered  . Fluad Quad(high Dose 65+) 05/23/2019  . Influenza, High Dose Seasonal PF 06/26/2017, 06/17/2018  . Influenza-Unspecified 06/22/2016, 06/26/2017  . Moderna Sars-Covid-2 Vaccination 10/08/2019, 11/05/2019  . Pneumococcal Polysaccharide-23 01/07/2015, 05/11/2017  . Zoster Recombinat (Shingrix) 12/11/2017, 03/18/2018   Pertinent  Health Maintenance Due  Topic Date Due  . PNA vac Low Risk Adult (2 of 2 - PCV13) 05/11/2018  . INFLUENZA VACCINE  12/08/2020 (Originally 04/10/2020)   Fall Risk  01/27/2020  Risk for fall due to : History of fall(s);Impaired balance/gait;Impaired  mobility;Medication side effect;Impaired vision;Mental status change  Follow up Falls evaluation completed;Education provided;Falls prevention discussed   Functional Status Survey:    There were no vitals filed for this visit. There is no height or weight on file to calculate BMI. Physical Exam Vitals reviewed.  Constitutional:      General: He is not in acute distress. HENT:     Head: Normocephalic.     Right Ear: There is no impacted cerumen.     Left Ear: There is no impacted cerumen.     Nose: Nose normal.     Mouth/Throat:     Mouth: Mucous membranes are moist.  Eyes:     General:        Right eye: No discharge.        Left eye: No discharge.  Cardiovascular:     Rate and Rhythm: Normal rate and regular rhythm.     Pulses: Normal pulses.     Heart sounds: Normal heart sounds. No murmur heard.   Pulmonary:     Effort: Pulmonary effort is normal. No respiratory  distress.     Breath sounds: Normal breath sounds. No wheezing.  Abdominal:     General: Bowel sounds are normal. There is no distension.     Palpations: Abdomen is soft.     Tenderness: There is no abdominal tenderness.     Comments: Bowel sounds hypoactive x4  Musculoskeletal:     Right lower leg: No edema.     Left lower leg: No edema.  Skin:    General: Skin is warm and dry.     Capillary Refill: Capillary refill takes less than 2 seconds.     Comments: Skin tear to left palm clean with no drainage. Dressing CDI, surrounding tissue intact.   Neurological:     Mental Status: He is alert. Mental status is at baseline.     Motor: Weakness present.     Gait: Gait abnormal.  Psychiatric:        Attention and Perception: Attention normal.        Mood and Affect: Mood normal.        Cognition and Memory: Memory is impaired.     Labs reviewed: Recent Labs    02/25/20 0000 04/13/20 0000 06/30/20 1549  NA 138 134* 131*  K 4.7 4.9 4.6  CL 110* 103 102  CO2 20 22 19*  GLUCOSE  --   --  114*  BUN 40*  32* 39*  CREATININE 1.5* 2.0* 2.16*  CALCIUM 9.7 9.3 8.8*   Recent Labs    11/25/19 0000 04/13/20 0000 06/30/20 1549  AST 16 19 25   ALT 8* 13 10  ALKPHOS 61 57 54  BILITOT  --   --  0.4  PROT  --   --  6.7  ALBUMIN 3.7 3.9 4.0   Recent Labs    04/13/20 0000 06/30/20 0000 06/30/20 1549 06/30/20 2203  WBC 6.2 5.2 7.2  --   HGB 8.1* 6.9* 8.0* 8.9*  HCT 25* 22* 26.6* 27.9*  MCV  --   --  76.7*  --   PLT 231 191 234  --    Lab Results  Component Value Date   TSH 2.21 11/25/2019   Lab Results  Component Value Date   HGBA1C 5.7 02/25/2020   Lab Results  Component Value Date   CHOL 99 11/25/2019   HDL 50 11/25/2019   LDLCALC 42 11/25/2019   TRIG 36 (A) 11/25/2019   CHOLHDL 3.3 03/30/2019    Significant Diagnostic Results in last 30 days:  No results found.  Assessment/Plan 1. Parkinson disease ([place]) - stable, continue sinemet 25-250 tid - he forgets to use assistive device making him a high risk for falls, last fall 12/18 with skin tear to left palm  2. Dementia due to Parkinson's disease with behavioral disturbance ([place]) - no reported behavioral outbursts - continue snf  3. Essential hypertension - bp at goal < 150/90 - continue clonidine and amlodipine - bmp  4. Stage 3 chronic kidney disease, unspecified whether stage 3a or 3b CKD ([place]) - continue to avoid nephrotoxic medications like NSAIDS and dose adjust medications to be renally excreted  5. Severe anemia - history of low hgb in October 2021 - tolerating ferrous sulfate 325 mg po daily, no reports of constipation - cbc/diff to recheck hgb  6. Protein-calorie malnutrition, severe ([facility]) - no major fluctuation in weight over past 3 months - continue thiamine, ensure, mvi and pureed diet - recommend small frequent pureed meals  7. Gastroesophageal reflux disease without esophagitis - stable with  omperazole    Family/ staff Communication: Plan discussed with patient and facility nurse.    Labs/tests ordered: cbc/diff, bmp    Windell Moulding, AGNP-C Geriatrics [facility] [phone], [place] [address] On Call:  [phone] & follow prompts after 5pm & weekends Office Phone:  [phone] Office Fax:  [phone]

## 2020-09-08 DIAGNOSIS — D509 Iron deficiency anemia, unspecified: Secondary | ICD-10-CM | POA: Diagnosis not present

## 2020-09-08 DIAGNOSIS — I10 Essential (primary) hypertension: Secondary | ICD-10-CM | POA: Diagnosis not present

## 2020-09-08 DIAGNOSIS — E559 Vitamin D deficiency, unspecified: Secondary | ICD-10-CM | POA: Diagnosis not present

## 2020-09-08 DIAGNOSIS — G20 Parkinson's disease: Secondary | ICD-10-CM | POA: Diagnosis not present

## 2020-09-08 LAB — BASIC METABOLIC PANEL
BUN: 32 — AB (ref 4–21)
CO2: 20 (ref 13–22)
Chloride: 107 (ref 99–108)
Creatinine: 1.5 — AB (ref 0.6–1.3)
Glucose: 87
Potassium: 4.4 (ref 3.4–5.3)
Sodium: 138 (ref 137–147)

## 2020-09-08 LAB — CBC AND DIFFERENTIAL
HCT: 28 — AB (ref 41–53)
Hemoglobin: 9.3 — AB (ref 13.5–17.5)
Platelets: 202 (ref 150–399)
WBC: 5.3

## 2020-09-08 LAB — CBC: RBC: 3.66 — AB (ref 3.87–5.11)

## 2020-09-08 LAB — COMPREHENSIVE METABOLIC PANEL
Calcium: 9 (ref 8.7–10.7)
GFR calc Af Amer: 47.67
GFR calc non Af Amer: 41.13

## 2020-09-16 DIAGNOSIS — H353211 Exudative age-related macular degeneration, right eye, with active choroidal neovascularization: Secondary | ICD-10-CM | POA: Diagnosis not present

## 2020-09-16 DIAGNOSIS — H353231 Exudative age-related macular degeneration, bilateral, with active choroidal neovascularization: Secondary | ICD-10-CM | POA: Diagnosis not present

## 2020-09-27 DIAGNOSIS — R0989 Other specified symptoms and signs involving the circulatory and respiratory systems: Secondary | ICD-10-CM | POA: Diagnosis not present

## 2020-09-27 DIAGNOSIS — R059 Cough, unspecified: Secondary | ICD-10-CM | POA: Diagnosis not present

## 2020-09-28 ENCOUNTER — Encounter: Payer: Self-pay | Admitting: Orthopedic Surgery

## 2020-09-28 ENCOUNTER — Non-Acute Institutional Stay (SKILLED_NURSING_FACILITY): Payer: Medicare Other | Admitting: Orthopedic Surgery

## 2020-09-28 DIAGNOSIS — I129 Hypertensive chronic kidney disease with stage 1 through stage 4 chronic kidney disease, or unspecified chronic kidney disease: Secondary | ICD-10-CM | POA: Diagnosis not present

## 2020-09-28 DIAGNOSIS — R6889 Other general symptoms and signs: Secondary | ICD-10-CM | POA: Diagnosis not present

## 2020-09-28 DIAGNOSIS — R509 Fever, unspecified: Secondary | ICD-10-CM

## 2020-09-28 NOTE — Progress Notes (Signed)
Location:    [facility] Room Number: 217/D Place of Service:  SNF (31) [facility] NP   Gayland Curry, DO  Patient Care Team: Gayland Curry, DO as PCP - General (Geriatric Medicine) Rehab, [place] ([place])  Extended Emergency Contact Information Primary Emergency Contact: Orpah Melter Address: [address]          [place], [place] [address] [name] of [place] Phone: [phone] Mobile Phone: [phone] Relation: Son  Code Status:  DNR Goals of care: Advanced Directive information Advanced Directives 09/28/2020  Does Patient Have a Medical Advance Directive? Yes  Type of Paramedic of [place];Out of facility DNR (pink MOST or yellow form)  Does patient want to make changes to medical advance directive? No - Patient declined  Copy of [place] in Chart? Yes - validated most recent copy scanned in chart (See row information)  Would patient like information on creating a medical advance directive? -  Pre-existing out of facility DNR order (yellow form or pink MOST form) Yellow form placed in chart (order not valid for inpatient use)     Chief Complaint  Patient presents with  . Acute Visit    Increased Secretions    HPI:  Pt is a 87 y.o. male seen today for an acute visit for increased oral secretions and fever.   He is a resident of [facility], seen today at bedside. PMH includes: hypertension, parkinson's, cerebral embolism with infarction, dementia with behavioral disturbance, prostate cancer and chronic kidney disease stage III, anemia, and malnutrition.   Facility nurse reports he is using 5 liters oxygen this morning. In addition, he has increased oral secretions in his mouth, making a gurgling noise. He is also running a fever of 99.6. Yesterday, chest xray done to r/o aspiration. Xray results unremarkable. He  had covid-19 a month ago. Minimal symptoms, no complications.   Today, he is responsive to sound. Alert to self.  Does not follow commands or express needs. Breathing appears tachypnic. Forehead warm.    Past Medical History:  Diagnosis Date  . Alzheimer's dementia ([place])   . Hypertension   . Parkinson disease ([place])   . Prostate cancer ([place])   . Renal disorder   . Syncope    Past Surgical History:  Procedure Laterality Date  . BACK SURGERY    . IR VERTEBROPLASTY CERV/THOR BX INC UNI/BIL INC/INJECT/IMAGING  01/31/2018  . IR VERTEBROPLASTY CERV/THOR BX INC UNI/BIL INC/INJECT/IMAGING  03/24/2019    No Known Allergies  Allergies as of 09/28/2020   No Known Allergies     Medication List       Accurate as of September 28, 2020  1:11 PM. If you have any questions, ask your nurse or doctor.        amLODipine 2.5 MG tablet Commonly known as: NORVASC Take 1 tablet (2.5 mg total) by mouth daily.   ARTIFICIAL TEARS OP Place 1 drop into both eyes 4 (four) times daily. For Dry Eyes   ascorbic acid 500 MG tablet Commonly known as: VITAMIN C Take 500 mg by mouth daily.   aspirin 81 MG chewable tablet Chew 1 tablet (81 mg total) by mouth daily.   BABY SHAMPOO EX Apply topically. Baby shampo ( less than a dime size ) with warm water can be use for eye hygiene   carbidopa-levodopa 25-250 MG tablet Commonly known as: SINEMET  IR Take 1 tablet by mouth 3 (three) times daily. 9am, 1pm, 7pm   cloNIDine 0.2 MG tablet Commonly known as: CATAPRES Take 0.2 mg by mouth 3 (three) times daily. 9am, 1pm, 7pm   D3-50 1.25 MG (50000 UT) capsule Generic drug: Cholecalciferol Take 50,000 Units by mouth every 30 (thirty) days.   feeding supplement Liqd Take 237 mLs by mouth 2 (two) times daily between meals.   ferrous sulfate 325 (65 FE) MG tablet Take 325 mg by mouth daily with breakfast.   multivitamin with minerals Tabs tablet Take 1 tablet by mouth daily.   OcuSoft Lid Scrub Plus  Pads Apply topically. Cleanse both eyelid, includes eyebrows and forehead twice daily to maintain good eye hygiene   omeprazole 40 MG capsule Commonly known as: PRILOSEC Take 40 mg by mouth daily.   ondansetron 4 MG tablet Commonly known as: ZOFRAN Take 4 mg by mouth every 6 (six) hours as needed for nausea or vomiting.   polyethylene glycol 17 g packet Commonly known as: MIRALAX / GLYCOLAX Take 17 g by mouth daily as needed for mild constipation.   ROBITUSSIN COUGH+CHEST CONG DM PO Take 20 mLs by mouth 2 (two) times daily as needed (cough).   thiamine 100 MG tablet Commonly known as: Vitamin B-1 Take 100 mg by mouth daily. Take at 1 PM       Review of Systems  Unable to perform ROS: Dementia    Immunization History  Administered Date(s) Administered  . Fluad Quad(high Dose 65+) 05/23/2019  . Influenza, High Dose Seasonal PF 06/26/2017, 06/17/2018  . Influenza-Unspecified 06/22/2016, 06/26/2017  . Moderna Sars-Covid-2 Vaccination 10/08/2019, 11/05/2019  . Pneumococcal Polysaccharide-23 01/07/2015, 05/11/2017  . Zoster Recombinat (Shingrix) 12/11/2017, 03/18/2018   Pertinent  Health Maintenance Due  Topic Date Due  . PNA vac Low Risk Adult (2 of 2 - PCV13) 05/11/2018  . INFLUENZA VACCINE  12/08/2020 (Originally 04/10/2020)   Fall Risk  01/27/2020  Risk for fall due to : History of fall(s);Impaired balance/gait;Impaired mobility;Medication side effect;Impaired vision;Mental status change  Follow up Falls evaluation completed;Education provided;Falls prevention discussed   Functional Status Survey:    Vitals:   09/28/20 1159  BP: (!) 134/52  Pulse: 70  Resp: 17  Temp: 97.7 F (36.5 C)  SpO2: 98%  Weight: 120 lb 9.6 oz (54.7 kg)  Height: 6\' 1"  (1.854 m)   Body mass index is 15.91 kg/m. Physical Exam Constitutional:      General: He is not in acute distress.    Comments: Frail, lethargic  HENT:     Head: Normocephalic.     Right Ear: There is no impacted  cerumen.     Left Ear: There is no impacted cerumen.     Nose: Nose normal.     Mouth/Throat:     Mouth: Mucous membranes are moist.     Pharynx: No posterior oropharyngeal erythema.  Cardiovascular:     Rate and Rhythm: Normal rate and regular rhythm.     Pulses: Normal pulses.     Heart sounds: Normal heart sounds. No murmur heard.   Pulmonary:     Effort: Pulmonary effort is normal. No respiratory distress.     Breath sounds: Normal breath sounds. No wheezing.     Comments: 5 liters nasal cannula Abdominal:     General: Bowel sounds are normal. There is no distension.     Palpations: Abdomen is soft.     Tenderness: There is no abdominal tenderness.  Musculoskeletal:  Right lower leg: No edema.     Left lower leg: No edema.  Skin:    General: Skin is warm and dry.     Capillary Refill: Capillary refill takes less than 2 seconds.  Neurological:     General: No focal deficit present.     Mental Status: Mental status is at baseline.     Motor: Weakness present.     Gait: Gait abnormal.  Psychiatric:        Attention and Perception: He is inattentive.        Mood and Affect: Affect is flat.        Behavior: Behavior normal.        Cognition and Memory: Memory is impaired.     Labs reviewed: Recent Labs    04/13/20 0000 06/30/20 1549 09/08/20 0000  NA 134* 131* 138  K 4.9 4.6 4.4  CL 103 102 107  CO2 22 19* 20  GLUCOSE  --  114*  --   BUN 32* 39* 32*  CREATININE 2.0* 2.16* 1.5*  CALCIUM 9.3 8.8* 9.0   Recent Labs    11/25/19 0000 04/13/20 0000 06/30/20 1549  AST 16 19 25   ALT 8* 13 10  ALKPHOS 61 57 54  BILITOT  --   --  0.4  PROT  --   --  6.7  ALBUMIN 3.7 3.9 4.0   Recent Labs    06/30/20 1549 06/30/20 2203 08/05/20 0000 08/22/20 0000 09/08/20 0000  WBC 7.2  --   --  8.0 5.3  HGB 8.0*   < > 10.6* 9.3* 9.3*  HCT 26.6*   < > 32* 29* 28*  MCV 76.7*  --   --   --   --   PLT 234  --   --  195 202   < > = values in this interval not  displayed.   Lab Results  Component Value Date   TSH 2.21 11/25/2019   Lab Results  Component Value Date   HGBA1C 5.7 02/25/2020   Lab Results  Component Value Date   CHOL 99 11/25/2019   HDL 50 11/25/2019   LDLCALC 42 11/25/2019   TRIG 36 (A) 11/25/2019   CHOLHDL 3.3 03/30/2019    Significant Diagnostic Results in last 30 days:  No results found.  Assessment/Plan 1. Excessive oral secretions - concerned about aspiration 1 day ago, chest xray negative, lung sounds clear  - suction oral secretions prn   2. Fever, unspecified fever cause - temp 99.6, forehead warm - past covid-19 infection 1 months ago - rapid flu test- today - give 650 mg po q 6 hrs prn for fever - cbc/diff- today - bmp- today - UA- today - urine culture- today    Family/ staff Communication: Plan discussed with patient and facility nurse  Labs/tests ordered:  none

## 2020-09-29 ENCOUNTER — Encounter: Payer: Self-pay | Admitting: Orthopedic Surgery

## 2020-09-29 ENCOUNTER — Non-Acute Institutional Stay (SKILLED_NURSING_FACILITY): Payer: Medicare Other | Admitting: Orthopedic Surgery

## 2020-09-29 DIAGNOSIS — R7989 Other specified abnormal findings of blood chemistry: Secondary | ICD-10-CM | POA: Diagnosis not present

## 2020-09-29 DIAGNOSIS — R34 Anuria and oliguria: Secondary | ICD-10-CM

## 2020-09-29 DIAGNOSIS — E860 Dehydration: Secondary | ICD-10-CM

## 2020-09-29 NOTE — Progress Notes (Signed)
Location:    [facility] Room Number: 217/D Place of Service:  SNF [id number]) Provider: Windell [facility] NP   Gayland Curry, DO  Patient Care Team: Gayland Curry, DO as PCP - General (Geriatric Medicine) Rehab, [place] ([place])  Extended Emergency Contact Information Primary Emergency Contact: Orpah Melter Address: [place], [place] [address] [name] of [place] Phone: [phone] Mobile Phone: [phone] Relation: Son  Code Status: DNR  Goals of care: Advanced Directive information Advanced Directives 09/29/2020  Does Patient Have a Medical Advance Directive? Yes  Type of Paramedic of [place];Out of facility DNR (pink MOST or yellow form)  Does patient want to make changes to medical advance directive? No - Patient declined  Copy of [place] in Chart? Yes - validated most recent copy scanned in chart (See row information)  Would patient like information on creating a medical advance directive? -  Pre-existing out of facility DNR order (yellow form or pink MOST form) Yellow form placed in chart (order not valid for inpatient use)     Chief Complaint  Patient presents with  . Acute Visit    Low Urine Output    HPI:  Pt is a 87 y.o. male seen today for an acute visit for low urine output.   He is a resident of [facility], seen today at bedside. PMH includes: hypertension, parkinson's, cerebral embolism with infarction, dementia with behavioral disturbance, prostate cancer and chronic kidney disease stage III, anemia, and malnutrition.   He was seen yesterday for increased oral secretions. UA and urine culture ordered. Today, labs still have not been collected. Facility nurse states he has not urinated. He is alert, but cannot express needs. Appears dehydrated. Creatinine 2.1. BUN 41. WBC 7.4. Chest xray done earlier  in the week unremarkable. Remains on 4LNC. He has a fever of 99.6 yesterday, but it resolved with 650 mg tylenol.   Facility nurse does not report any other concerns, vitals stable.    Past Medical History:  Diagnosis Date  . Alzheimer's dementia ([place])   . Hypertension   . Parkinson disease ([place])   . Prostate cancer ([place])   . Renal disorder   . Syncope    Past Surgical History:  Procedure Laterality Date  . BACK SURGERY    . IR VERTEBROPLASTY CERV/THOR BX INC UNI/BIL INC/INJECT/IMAGING  01/31/2018  . IR VERTEBROPLASTY CERV/THOR BX INC UNI/BIL INC/INJECT/IMAGING  03/24/2019    No Known Allergies  Allergies as of 09/29/2020   No Known Allergies     Medication List       Accurate as of September 29, 2020  2:29 PM. If you have any questions, ask your nurse or doctor.        amLODipine 2.5 MG tablet Commonly known as: NORVASC Take 1 tablet (2.5 mg total) by mouth daily.   ARTIFICIAL TEARS OP Place 1 drop into both eyes 4 (four) times daily. For Dry Eyes   ascorbic acid 500 MG tablet Commonly known as: VITAMIN C Take 500 mg by mouth daily.   aspirin 81 MG chewable tablet Chew 1 tablet (81 mg total) by mouth daily.   BABY SHAMPOO EX Apply topically. Baby shampo ( less than a dime size ) with warm water can be use for eye hygiene   carbidopa-levodopa 25-250 MG tablet Commonly known  as: SINEMET IR Take 1 tablet by mouth 3 (three) times daily. 9am, 1pm, 7pm   cefTRIAXone 1 g injection Commonly known as: ROCEPHIN Inject 1 g into the muscle once. Mix with 2.1 ml of Lidocaine and give IM x 1 dose   cloNIDine 0.2 MG tablet Commonly known as: CATAPRES Take 0.2 mg by mouth 2 (two) times daily.   D3-50 1.25 MG (50000 UT) capsule Generic drug: Cholecalciferol Take 50,000 Units by mouth every 30 (thirty) days.   feeding supplement Liqd Take 237 mLs by mouth 2 (two) times daily between meals.   ferrous sulfate 325 (65 FE) MG tablet Take 325 mg by mouth 2 (two) times  daily with a meal.   multivitamin with minerals Tabs tablet Take 1 tablet by mouth daily.   OcuSoft Lid Scrub Plus Pads Apply topically. Cleanse both eyelid, includes eyebrows and forehead twice daily to maintain good eye hygiene   omeprazole 40 MG capsule Commonly known as: PRILOSEC Take 40 mg by mouth daily.   ondansetron 4 MG tablet Commonly known as: ZOFRAN Take 4 mg by mouth every 6 (six) hours as needed for nausea or vomiting.   polyethylene glycol 17 g packet Commonly known as: MIRALAX / GLYCOLAX Take 17 g by mouth daily as needed for mild constipation.   ROBITUSSIN COUGH+CHEST CONG DM PO Take 20 mLs by mouth 2 (two) times daily as needed (cough).   sodium chloride 0.9 % Soln 1,000 mLs. 1,000 ML :Infuse NS at 50 cc/hr x 1 bag   thiamine 100 MG tablet Commonly known as: Vitamin B-1 Take 100 mg by mouth daily. Take at 1 PM       Review of Systems  Unable to perform ROS: Dementia    Immunization History  Administered Date(s) Administered  . Fluad Quad(high Dose 65+) 05/23/2019  . Influenza, High Dose Seasonal PF 06/26/2017, 06/17/2018  . Influenza-Unspecified 06/22/2016, 06/26/2017  . Moderna Sars-Covid-2 Vaccination 10/08/2019, 11/05/2019  . Pneumococcal Polysaccharide-23 01/07/2015, 05/11/2017  . Zoster Recombinat (Shingrix) 12/11/2017, 03/18/2018   Pertinent  Health Maintenance Due  Topic Date Due  . PNA vac Low Risk Adult (2 of 2 - PCV13) 05/11/2018  . INFLUENZA VACCINE  12/08/2020 (Originally 04/10/2020)   Fall Risk  01/27/2020  Risk for fall due to : History of fall(s);Impaired balance/gait;Impaired mobility;Medication side effect;Impaired vision;Mental status change  Follow up Falls evaluation completed;Education provided;Falls prevention discussed   Functional Status Survey:    Vitals:   09/29/20 1428  BP: (!) 137/52  Pulse: 75  Resp: 17  Temp: (!) 97.5 F (36.4 C)  SpO2: 98%  Weight: 120 lb 9.6 oz (54.7 kg)  Height: 6\' 1"  (1.854 m)   Body  mass index is 15.91 kg/m. Physical Exam Constitutional:      General: He is not in acute distress. HENT:     Nose: Nose normal. No congestion.     Mouth/Throat:     Mouth: Mucous membranes are dry.     Pharynx: No posterior oropharyngeal erythema.     Comments: mucous membranes pale Cardiovascular:     Rate and Rhythm: Normal rate and regular rhythm.     Pulses: Normal pulses.     Heart sounds: Normal heart sounds. No murmur heard.   Pulmonary:     Effort: Pulmonary effort is normal. No respiratory distress.     Breath sounds: Normal breath sounds. No wheezing.  Abdominal:     General: Abdomen is flat. Bowel sounds are normal.     Palpations: Abdomen  is soft.  Genitourinary:    Comments: Bladder not distended Musculoskeletal:     Right lower leg: No edema.     Left lower leg: No edema.  Skin:    General: Skin is dry.     Capillary Refill: Capillary refill takes less than 2 seconds.  Neurological:     General: No focal deficit present.     Mental Status: He is alert. Mental status is at baseline.     Motor: Weakness present.     Gait: Gait abnormal.  Psychiatric:        Mood and Affect: Mood normal.        Behavior: Behavior normal.     Labs reviewed: Recent Labs    04/13/20 0000 06/30/20 1549 09/08/20 0000  NA 134* 131* 138  K 4.9 4.6 4.4  CL 103 102 107  CO2 22 19* 20  GLUCOSE  --  114*  --   BUN 32* 39* 32*  CREATININE 2.0* 2.16* 1.5*  CALCIUM 9.3 8.8* 9.0   Recent Labs    11/25/19 0000 04/13/20 0000 06/30/20 1549  AST 16 19 25   ALT 8* 13 10  ALKPHOS 61 57 54  BILITOT  --   --  0.4  PROT  --   --  6.7  ALBUMIN 3.7 3.9 4.0   Recent Labs    06/30/20 1549 06/30/20 2203 08/05/20 0000 08/22/20 0000 09/08/20 0000  WBC 7.2  --   --  8.0 5.3  HGB 8.0*   < > 10.6* 9.3* 9.3*  HCT 26.6*   < > 32* 29* 28*  MCV 76.7*  --   --   --   --   PLT 234  --   --  195 202   < > = values in this interval not displayed.   Lab Results  Component Value Date    TSH 2.21 11/25/2019   Lab Results  Component Value Date   HGBA1C 5.7 02/25/2020   Lab Results  Component Value Date   CHOL 99 11/25/2019   HDL 50 11/25/2019   LDLCALC 42 11/25/2019   TRIG 36 (A) 11/25/2019   CHOLHDL 3.3 03/30/2019    Significant Diagnostic Results in last 30 days:  No results found.  Assessment/Plan 1. Dehydration - skin dry, oral mucous membranes pale, vitals stable - insert IV - start NS 2 50cc/hr x 1 bag, then 20cc/hr x 1 bag  2. Elevated serum creatinine - creat 2.1, baseline 1.5 - suspect due to dehydration  - repeat bmp 10/01/20  3. Low urine output -  insert IV - start NS 2 50cc/hr x 1 bag, then 20cc/hr x 1 bag - if UOP increases, give Rocephin 1g IM once    Family/ staff Communication: Plan discussed with patient and facility nurse  Labs/tests ordered:  bmp

## 2020-09-30 DIAGNOSIS — N390 Urinary tract infection, site not specified: Secondary | ICD-10-CM | POA: Diagnosis not present

## 2020-10-01 ENCOUNTER — Telehealth: Payer: Self-pay | Admitting: Adult Health

## 2020-10-01 DIAGNOSIS — I10 Essential (primary) hypertension: Secondary | ICD-10-CM | POA: Diagnosis not present

## 2020-10-01 DIAGNOSIS — I129 Hypertensive chronic kidney disease with stage 1 through stage 4 chronic kidney disease, or unspecified chronic kidney disease: Secondary | ICD-10-CM | POA: Diagnosis not present

## 2020-10-01 NOTE — Telephone Encounter (Signed)
Nurse called from [facility] to report that resident had a UA C and S report come back showing no predominant organism present. He has been getting IVF due to dehydration and poor intake. His BMP returned Cr 1.73 BUN 61.4 NA 151 Cl117 C02 20 K 4.3.  He is not eating and drinking well but is taking in small amts of fluid and having wet briefs. He had a CXR on 1/18 which was negative for acute infection. He had covid 1 month ago. The nurse reports that he is on 4 liters of oxygen and is not sob and has normal 02 sats and vitals. Its not clear of the oxygen dependency is due to covid or other issues. I gave an order for the nurse to give 1 liter of 1/2 NS and order another CXR to rule out pna given his continued oxygen use, recent covid infection, dehydrated state, concern for aspiration, and low grade temps.

## 2020-10-01 NOTE — Telephone Encounter (Signed)
ADDENDUM: Nurse returned call with CXR results showing bilateral pneumonia. Avelox ordered daily with florastor bid for 7 days

## 2020-10-04 ENCOUNTER — Encounter (HOSPITAL_COMMUNITY): Payer: Self-pay

## 2020-10-04 ENCOUNTER — Emergency Department (HOSPITAL_COMMUNITY): Payer: Medicare Other

## 2020-10-04 ENCOUNTER — Inpatient Hospital Stay (HOSPITAL_COMMUNITY)
Admission: EM | Admit: 2020-10-04 | Discharge: 2020-10-11 | DRG: 871 | Disposition: E | Payer: Medicare Other | Attending: Family Medicine | Admitting: Family Medicine

## 2020-10-04 DIAGNOSIS — Z8673 Personal history of transient ischemic attack (TIA), and cerebral infarction without residual deficits: Secondary | ICD-10-CM

## 2020-10-04 DIAGNOSIS — Z515 Encounter for palliative care: Secondary | ICD-10-CM

## 2020-10-04 DIAGNOSIS — E870 Hyperosmolality and hypernatremia: Secondary | ICD-10-CM | POA: Diagnosis present

## 2020-10-04 DIAGNOSIS — R6520 Severe sepsis without septic shock: Secondary | ICD-10-CM | POA: Diagnosis present

## 2020-10-04 DIAGNOSIS — A419 Sepsis, unspecified organism: Secondary | ICD-10-CM | POA: Diagnosis not present

## 2020-10-04 DIAGNOSIS — G929 Unspecified toxic encephalopathy: Secondary | ICD-10-CM | POA: Diagnosis present

## 2020-10-04 DIAGNOSIS — Z66 Do not resuscitate: Secondary | ICD-10-CM | POA: Diagnosis not present

## 2020-10-04 DIAGNOSIS — J9601 Acute respiratory failure with hypoxia: Secondary | ICD-10-CM | POA: Diagnosis not present

## 2020-10-04 DIAGNOSIS — F0281 Dementia in other diseases classified elsewhere with behavioral disturbance: Secondary | ICD-10-CM | POA: Diagnosis present

## 2020-10-04 DIAGNOSIS — R0989 Other specified symptoms and signs involving the circulatory and respiratory systems: Secondary | ICD-10-CM | POA: Diagnosis not present

## 2020-10-04 DIAGNOSIS — Z8249 Family history of ischemic heart disease and other diseases of the circulatory system: Secondary | ICD-10-CM

## 2020-10-04 DIAGNOSIS — R6889 Other general symptoms and signs: Secondary | ICD-10-CM | POA: Diagnosis not present

## 2020-10-04 DIAGNOSIS — R0600 Dyspnea, unspecified: Secondary | ICD-10-CM | POA: Diagnosis not present

## 2020-10-04 DIAGNOSIS — I129 Hypertensive chronic kidney disease with stage 1 through stage 4 chronic kidney disease, or unspecified chronic kidney disease: Secondary | ICD-10-CM | POA: Diagnosis present

## 2020-10-04 DIAGNOSIS — F03918 Unspecified dementia, unspecified severity, with other behavioral disturbance: Secondary | ICD-10-CM | POA: Diagnosis present

## 2020-10-04 DIAGNOSIS — Z79899 Other long term (current) drug therapy: Secondary | ICD-10-CM | POA: Diagnosis not present

## 2020-10-04 DIAGNOSIS — E785 Hyperlipidemia, unspecified: Secondary | ICD-10-CM | POA: Diagnosis not present

## 2020-10-04 DIAGNOSIS — I10 Essential (primary) hypertension: Secondary | ICD-10-CM | POA: Diagnosis present

## 2020-10-04 DIAGNOSIS — Z789 Other specified health status: Secondary | ICD-10-CM | POA: Diagnosis not present

## 2020-10-04 DIAGNOSIS — Z743 Need for continuous supervision: Secondary | ICD-10-CM | POA: Diagnosis not present

## 2020-10-04 DIAGNOSIS — N1832 Chronic kidney disease, stage 3b: Secondary | ICD-10-CM | POA: Diagnosis present

## 2020-10-04 DIAGNOSIS — G20 Parkinson's disease: Secondary | ICD-10-CM | POA: Diagnosis not present

## 2020-10-04 DIAGNOSIS — N179 Acute kidney failure, unspecified: Secondary | ICD-10-CM | POA: Diagnosis not present

## 2020-10-04 DIAGNOSIS — R0602 Shortness of breath: Secondary | ICD-10-CM | POA: Diagnosis present

## 2020-10-04 DIAGNOSIS — Z20822 Contact with and (suspected) exposure to covid-19: Secondary | ICD-10-CM | POA: Diagnosis not present

## 2020-10-04 DIAGNOSIS — Z7982 Long term (current) use of aspirin: Secondary | ICD-10-CM | POA: Diagnosis not present

## 2020-10-04 DIAGNOSIS — S2231XA Fracture of one rib, right side, initial encounter for closed fracture: Secondary | ICD-10-CM | POA: Diagnosis not present

## 2020-10-04 DIAGNOSIS — C61 Malignant neoplasm of prostate: Secondary | ICD-10-CM | POA: Diagnosis present

## 2020-10-04 DIAGNOSIS — J690 Pneumonitis due to inhalation of food and vomit: Secondary | ICD-10-CM | POA: Diagnosis not present

## 2020-10-04 DIAGNOSIS — Z7189 Other specified counseling: Secondary | ICD-10-CM | POA: Diagnosis not present

## 2020-10-04 DIAGNOSIS — J189 Pneumonia, unspecified organism: Secondary | ICD-10-CM | POA: Diagnosis not present

## 2020-10-04 DIAGNOSIS — G309 Alzheimer's disease, unspecified: Secondary | ICD-10-CM | POA: Diagnosis present

## 2020-10-04 DIAGNOSIS — R0902 Hypoxemia: Secondary | ICD-10-CM | POA: Diagnosis not present

## 2020-10-04 DIAGNOSIS — J449 Chronic obstructive pulmonary disease, unspecified: Secondary | ICD-10-CM | POA: Diagnosis not present

## 2020-10-04 DIAGNOSIS — F0391 Unspecified dementia with behavioral disturbance: Secondary | ICD-10-CM | POA: Diagnosis present

## 2020-10-04 DIAGNOSIS — I499 Cardiac arrhythmia, unspecified: Secondary | ICD-10-CM | POA: Diagnosis not present

## 2020-10-04 DIAGNOSIS — Z87891 Personal history of nicotine dependence: Secondary | ICD-10-CM | POA: Diagnosis not present

## 2020-10-04 DIAGNOSIS — G20A1 Parkinson's disease without dyskinesia, without mention of fluctuations: Secondary | ICD-10-CM | POA: Diagnosis present

## 2020-10-04 DIAGNOSIS — R404 Transient alteration of awareness: Secondary | ICD-10-CM | POA: Diagnosis not present

## 2020-10-04 DIAGNOSIS — R069 Unspecified abnormalities of breathing: Secondary | ICD-10-CM | POA: Diagnosis not present

## 2020-10-04 MED ORDER — LACTATED RINGERS IV BOLUS (SEPSIS)
1000.0000 mL | Freq: Once | INTRAVENOUS | Status: AC
  Administered 2020-10-05: 1000 mL via INTRAVENOUS

## 2020-10-04 MED ORDER — VANCOMYCIN HCL IN DEXTROSE 1-5 GM/200ML-% IV SOLN
1000.0000 mg | Freq: Once | INTRAVENOUS | Status: AC
  Administered 2020-10-05: 1000 mg via INTRAVENOUS
  Filled 2020-10-04: qty 200

## 2020-10-04 MED ORDER — LACTATED RINGERS IV SOLN: INTRAVENOUS | Status: DC

## 2020-10-04 MED ORDER — SODIUM CHLORIDE 0.9 % IV SOLN
2.0000 g | Freq: Once | INTRAVENOUS | Status: AC
  Administered 2020-10-05: 2 g via INTRAVENOUS
  Filled 2020-10-04: qty 2

## 2020-10-04 MED ORDER — METRONIDAZOLE IN NACL 5-0.79 MG/ML-% IV SOLN
500.0000 mg | Freq: Once | INTRAVENOUS | Status: AC
  Administered 2020-10-05: 500 mg via INTRAVENOUS
  Filled 2020-10-04: qty 100

## 2020-10-04 MED ORDER — ACETAMINOPHEN 650 MG RE SUPP
650.0000 mg | Freq: Once | RECTAL | Status: AC
  Administered 2020-10-05: 650 mg via RECTAL
  Filled 2020-10-04: qty 1

## 2020-10-04 NOTE — ED Provider Notes (Signed)
[facility] EMERGENCY DEPARTMENT Provider Note   CSN: [id number] Arrival date & time: 09/23/2020  2323     History Chief Complaint  Patient presents with  . Shortness of Breath  . Abnormal Lab    Jose Baldwin is a 87 y.o. male.  The history is provided by the EMS personnel. The history is limited by the condition of the patient.  Shortness of Breath Severity:  Severe Onset quality:  Gradual Timing:  Constant Progression:  Worsening Chronicity:  New Context: not smoke exposure   Relieved by:  Nothing Worsened by:  Nothing Ineffective treatments:  None tried Associated symptoms: fever   Associated symptoms: no rash   Risk factors: no obesity   Abnormal Lab      Past Medical History:  Diagnosis Date  . Alzheimer's dementia ([place])   . Hypertension   . Parkinson disease ([place])   . Prostate cancer ([place])   . Renal disorder   . Syncope     Patient Active Problem List   Diagnosis Date Noted  . Severe anemia 09/01/2019  . Dementia with behavioral disturbance ([place]) 05/30/2019  . Hyperlipidemia 05/30/2019  . Bradycardia 05/22/2019  . Goals of care, counseling/discussion   . Palliative care by specialist   . Normocytic anemia 05/21/2019  . CKD (chronic kidney disease), stage III ([place]) 05/21/2019  . Physical deconditioning 05/21/2019  . Hematuria 05/21/2019  . Hypokalemia 04/20/2019  . Hypomagnesemia 04/20/2019  . Parkinson disease ([place]) 04/20/2019  . Cardiac arrhythmia 04/20/2019  . Protein-calorie malnutrition, severe 03/31/2019  . Cerebral embolism with cerebral infarction 03/30/2019  . POTS (postural orthostatic tachycardia syndrome) 03/28/2019  . HTN (hypertension) 02/04/2018  . Prostate cancer ([place]) 02/04/2018  . Iron deficiency anemia 02/04/2018  . Syncope 02/02/2018    Past Surgical History:  Procedure Laterality Date  . BACK SURGERY    . IR VERTEBROPLASTY CERV/THOR BX INC UNI/BIL INC/INJECT/IMAGING  01/31/2018  . IR VERTEBROPLASTY  CERV/THOR BX INC UNI/BIL INC/INJECT/IMAGING  03/24/2019       Family History  Problem Relation Age of Onset  . Heart attack Father 73    Social History   Tobacco Use  . Smoking status: Former Smoker    Packs/day: 2.00    Years: 30.00    Pack years: 60.00    Types: Cigarettes    Quit date: 1990    Years since quitting: 32.0  . Smokeless tobacco: Never Used  Vaping Use  . Vaping Use: Never used  Substance Use Topics  . Alcohol use: Yes    Alcohol/week: 14.0 standard drinks    Types: 14 Cans of beer per week  . Drug use: Never    Home Medications Prior to Admission medications   Medication Sig Start Date End Date Taking? Authorizing Provider  amLODipine (NORVASC) 2.5 MG tablet Take 1 tablet (2.5 mg total) by mouth daily. 05/26/19   Hongalgi, Lenis Dickinson, MD  ascorbic acid (VITAMIN C) 500 MG tablet Take 500 mg by mouth daily.    [provider]  aspirin 81 MG chewable tablet Chew 1 tablet (81 mg total) by mouth daily. 04/01/19   Nita Sells, MD  carbidopa-levodopa (SINEMET IR) 25-250 MG tablet Take 1 tablet by mouth 3 (three) times daily. 9am, 1pm, 7pm    [provider]  Carboxymethylcellulose Sodium (ARTIFICIAL TEARS OP) Place 1 drop into both eyes 4 (four) times daily. For Dry Eyes 07/05/20   [provider]  cefTRIAXone (ROCEPHIN) 1 g injection Inject 1 g into the  muscle once. Mix with 2.1 ml of Lidocaine and give IM x 1 dose 09/29/20   [provider]  Cholecalciferol (D3-50) 1.25 MG (50000 UT) capsule Take 50,000 Units by mouth every 30 (thirty) days.    [provider]  cloNIDine (CATAPRES) 0.2 MG tablet Take 0.2 mg by mouth 2 (two) times daily.    [provider]  Dextromethorphan-guaiFENesin (ROBITUSSIN COUGH+CHEST CONG DM PO) Take 20 mLs by mouth 2 (two) times daily as needed (cough).  03/23/20   [provider]  Eyelid Cleansers (OCUSOFT LID SCRUB PLUS) PADS Apply topically. Cleanse both eyelid, includes  eyebrows and forehead twice daily to maintain good eye hygiene 08/01/20   [provider]  feeding supplement, ENSURE ENLIVE, (ENSURE ENLIVE) LIQD Take 237 mLs by mouth 2 (two) times daily between meals. 05/25/19   Hongalgi, Lenis Dickinson, MD  ferrous sulfate 325 (65 FE) MG tablet Take 325 mg by mouth 2 (two) times daily with a meal.    [provider]  Infant Care Products (BABY SHAMPOO EX) Apply topically. Baby shampo ( less than a dime size ) with warm water can be use for eye hygiene 09/16/19   [provider]  moxifloxacin (AVELOX) 400 MG tablet Take 400 mg by mouth daily at 8 pm. 10/01/20 10/07/20  [provider]  Multiple Vitamin (MULTIVITAMIN WITH MINERALS) TABS tablet Take 1 tablet by mouth daily. 05/26/19   Hongalgi, Lenis Dickinson, MD  omeprazole (PRILOSEC) 40 MG capsule Take 40 mg by mouth daily.    [provider]  ondansetron (ZOFRAN) 4 MG tablet Take 4 mg by mouth every 6 (six) hours as needed for nausea or vomiting.    [provider]  polyethylene glycol (MIRALAX / GLYCOLAX) 17 g packet Take 17 g by mouth daily as needed for mild constipation. 04/23/19   Aline August, MD  saccharomyces boulardii (FLORASTOR) 250 MG capsule Take 250 mg by mouth 2 (two) times daily. 10/01/20 10/07/20  [provider]  sodium chloride 0.9 % SOLN 1,000 mLs. 1,000 ML :Infuse NS at 50 cc/hr x 1 bag    [provider]  thiamine (VITAMIN B-1) 100 MG tablet Take 100 mg by mouth daily. Take at 1 PM    [provider]    Allergies    Patient has no known allergies.  Review of Systems   Review of Systems  Unable to perform ROS: Dementia  Constitutional: Positive for fever.  Respiratory: Positive for shortness of breath.   Skin: Negative for rash.    Physical Exam Updated Vital Signs BP (!) 101/54 (BP Location: Right Arm)   Pulse 73   Temp (!) 101.3 F (38.5 C) (Axillary)   Resp (!) 31   SpO2 90%   Physical Exam Vitals and nursing  note reviewed.  Constitutional:      Appearance: He is not diaphoretic.  HENT:     Head: Normocephalic and atraumatic.     Nose: Nose normal.  Eyes:     Conjunctiva/sclera: Conjunctivae normal.     Pupils: Pupils are equal, round, and reactive to light.  Cardiovascular:     Rate and Rhythm: Normal rate and regular rhythm.     Pulses: Normal pulses.     Heart sounds: Normal heart sounds.  Pulmonary:     Effort: Tachypnea present.     Breath sounds: Rhonchi present.  Abdominal:     General: Abdomen is flat. Bowel sounds are normal.     Palpations: Abdomen is soft.  Tenderness: There is no abdominal tenderness. There is no guarding.  Musculoskeletal:        General: Normal range of motion.     Cervical back: Normal range of motion and neck supple. No rigidity.     Right lower leg: No edema.     Left lower leg: No edema.  Skin:    General: Skin is warm and dry.     Capillary Refill: Capillary refill takes less than 2 seconds.  Neurological:     Deep Tendon Reflexes: Reflexes normal.     ED Results / Procedures / Treatments   Labs (all labs ordered are listed, but only abnormal results are displayed) Results for orders placed or performed during the hospital encounter of 10-24-20  Comprehensive metabolic panel  Result Value Ref Range   Sodium 158 (H) 135 - 145 mmol/L   Potassium 4.2 3.5 - 5.1 mmol/L   Chloride 126 (H) 98 - 111 mmol/L   CO2 18 (L) 22 - 32 mmol/L   Glucose, Bld 120 (H) 70 - 99 mg/dL   BUN 80 (H) 8 - 23 mg/dL   Creatinine, Ser 3.23 (H) 0.61 - 1.24 mg/dL   Calcium 9.0 8.9 - 10.3 mg/dL   Total Protein 6.1 (L) 6.5 - 8.1 g/dL   Albumin 2.5 (L) 3.5 - 5.0 g/dL   AST 45 (H) 15 - 41 U/L   ALT 22 0 - 44 U/L   Alkaline Phosphatase 81 38 - 126 U/L   Total Bilirubin 0.8 0.3 - 1.2 mg/dL   GFR, Estimated 18 (L) >60 mL/min   Anion gap 14 5 - 15  CBC WITH DIFFERENTIAL  Result Value Ref Range   WBC 15.9 (H) 4.0 - 10.5 K/uL   RBC 4.17 (L) 4.22 - 5.81 MIL/uL    Hemoglobin 11.2 (L) 13.0 - 17.0 g/dL   HCT 39.3 39.0 - 52.0 %   MCV 94.2 80.0 - 100.0 fL   MCH 26.9 26.0 - 34.0 pg   MCHC 28.5 (L) 30.0 - 36.0 g/dL   RDW 20.2 (H) 11.5 - 15.5 %   Platelets 197 150 - 400 K/uL   nRBC 0.0 0.0 - 0.2 %   Neutrophils Relative % 91 %   Neutro Abs 14.4 (H) 1.7 - 7.7 K/uL   Lymphocytes Relative 3 %   Lymphs Abs 0.4 (L) 0.7 - 4.0 K/uL   Monocytes Relative 6 %   Monocytes Absolute 1.0 0.1 - 1.0 K/uL   Eosinophils Relative 0 %   Eosinophils Absolute 0.0 0.0 - 0.5 K/uL   Basophils Relative 0 %   Basophils Absolute 0.0 0.0 - 0.1 K/uL   Immature Granulocytes 0 %   Abs Immature Granulocytes 0.07 0.00 - 0.07 K/uL  Protime-INR  Result Value Ref Range   Prothrombin Time 14.8 11.4 - 15.2 seconds   INR 1.2 0.8 - 1.2  APTT  Result Value Ref Range   aPTT 24 24 - 36 seconds   DG Chest Port 1 View  Result Date: 10-24-2020 CLINICAL DATA:  Sepsis, progressive hypoxia EXAM: PORTABLE CHEST 1 VIEW COMPARISON:  07/25/2019 FINDINGS: The lungs are symmetrically hyperinflated in keeping with changes of underlying COPD. There has developed extensive airspace infiltrate within the right lung base, likely infectious in the appropriate clinical setting. No pneumothorax or pleural effusion. Cardiac size within normal limits. Multiple healed right rib fractures are noted. T12 vertebroplasty has been performed. IMPRESSION: Extensive right basilar infiltrate, likely infectious in the acute setting. COPD Electronically Signed  By: Fidela Salisbury MD   On: 09/22/2020 23:48    EKG None  Radiology DG Chest Port 1 View  Result Date: 10/05/2020 CLINICAL DATA:  Sepsis, progressive hypoxia EXAM: PORTABLE CHEST 1 VIEW COMPARISON:  07/25/2019 FINDINGS: The lungs are symmetrically hyperinflated in keeping with changes of underlying COPD. There has developed extensive airspace infiltrate within the right lung base, likely infectious in the appropriate clinical setting. No pneumothorax or pleural  effusion. Cardiac size within normal limits. Multiple healed right rib fractures are noted. T12 vertebroplasty has been performed. IMPRESSION: Extensive right basilar infiltrate, likely infectious in the acute setting. COPD Electronically Signed   By: Fidela Salisbury MD   On: 09/18/2020 23:48    Procedures Procedures   Medications Ordered in ED Medications  lactated ringers infusion (has no administration in time range)  lactated ringers bolus 1,000 mL (has no administration in time range)  ceFEPIme (MAXIPIME) 2 g in sodium chloride 0.9 % 100 mL IVPB (has no administration in time range)  metroNIDAZOLE (FLAGYL) IVPB 500 mg (has no administration in time range)  vancomycin (VANCOCIN) IVPB 1000 mg/200 mL premix (has no administration in time range)  acetaminophen (TYLENOL) suppository 650 mg (has no administration in time range)   MDM Reviewed: previous chart, nursing note and vitals Interpretation: labs, ECG and x-ray (PNA on CXR, elevated white count and acute renal failure ) Total time providing critical care: 30-74 minutes (sepsis antibiotics ). This excludes time spent performing separately reportable procedures and services. Consults: admitting MD  CRITICAL CARE Performed by: Reginald Weida K Aliya Sol-Rasch Total critical care time: 60 minutes Critical care time was exclusive of separately billable procedures and treating other patients. Critical care was necessary to treat or prevent imminent or life-threatening deterioration. Critical care was time spent personally by me on the following activities: development of treatment plan with patient and/or surrogate as well as nursing, discussions with consultants, evaluation of patient's response to treatment, examination of patient, obtaining history from patient or surrogate, ordering and performing treatments and interventions, ordering and review of laboratory studies, ordering and review of radiographic studies, pulse oximetry and re-evaluation of  patient's condition.   ED Course  I have reviewed the triage vital signs and the nursing notes.  Pertinent labs & imaging results that were available during my care of the patient were reviewed by me and considered in my medical decision making (see chart for details).    Tobe Kan Podolsky was evaluated in Emergency Department on 10/05/2020 for the symptoms described in the history of present illness. He was evaluated in the context of the global COVID-19 pandemic, which necessitated consideration that the patient might be at risk for infection with the SARS-CoV-2 virus that causes COVID-19. Institutional protocols and algorithms that pertain to the evaluation of patients at risk for COVID-19 are in a state of rapid change based on information released by regulatory bodies including the CDC and federal and state organizations. These policies and algorithms were followed during the patient's care in the ED.  Final Clinical Impression(s) / ED Diagnoses   Patient is a DNR DNI and therefore I will not intubate the patient but patient is receiving IV antibiotics and has had several boluses from myself and EMS.  Plan is to admit to sepsis.     Zak Gondek, MD 10/05/20 0040

## 2020-10-04 NOTE — ED Triage Notes (Signed)
Pt EMS arrival from [facility] rehab co SOB and reporting abnormal lab post PNA diagnosis. Per EMS pt GCS upon arrival, per EMS staff reports pt able to communicate per baseline. Wears 4L O2 in 80s, O2 increased to 15L nonrebreather 86-94%. 536mL bolus BP 70s, increased to 105s. Irregular rhythm. 20G L forearm.

## 2020-10-05 ENCOUNTER — Encounter (HOSPITAL_COMMUNITY): Payer: Self-pay | Admitting: Emergency Medicine

## 2020-10-05 DIAGNOSIS — N179 Acute kidney failure, unspecified: Secondary | ICD-10-CM | POA: Diagnosis present

## 2020-10-05 DIAGNOSIS — Z79899 Other long term (current) drug therapy: Secondary | ICD-10-CM | POA: Diagnosis not present

## 2020-10-05 DIAGNOSIS — C61 Malignant neoplasm of prostate: Secondary | ICD-10-CM

## 2020-10-05 DIAGNOSIS — N1832 Chronic kidney disease, stage 3b: Secondary | ICD-10-CM | POA: Diagnosis present

## 2020-10-05 DIAGNOSIS — Z7189 Other specified counseling: Secondary | ICD-10-CM

## 2020-10-05 DIAGNOSIS — R0600 Dyspnea, unspecified: Secondary | ICD-10-CM

## 2020-10-05 DIAGNOSIS — A419 Sepsis, unspecified organism: Principal | ICD-10-CM

## 2020-10-05 DIAGNOSIS — R6520 Severe sepsis without septic shock: Secondary | ICD-10-CM

## 2020-10-05 DIAGNOSIS — Z66 Do not resuscitate: Secondary | ICD-10-CM | POA: Diagnosis present

## 2020-10-05 DIAGNOSIS — Z515 Encounter for palliative care: Secondary | ICD-10-CM | POA: Diagnosis not present

## 2020-10-05 DIAGNOSIS — G20 Parkinson's disease: Secondary | ICD-10-CM | POA: Diagnosis present

## 2020-10-05 DIAGNOSIS — Z8249 Family history of ischemic heart disease and other diseases of the circulatory system: Secondary | ICD-10-CM | POA: Diagnosis not present

## 2020-10-05 DIAGNOSIS — E785 Hyperlipidemia, unspecified: Secondary | ICD-10-CM | POA: Diagnosis present

## 2020-10-05 DIAGNOSIS — R0602 Shortness of breath: Secondary | ICD-10-CM | POA: Diagnosis present

## 2020-10-05 DIAGNOSIS — I129 Hypertensive chronic kidney disease with stage 1 through stage 4 chronic kidney disease, or unspecified chronic kidney disease: Secondary | ICD-10-CM | POA: Diagnosis present

## 2020-10-05 DIAGNOSIS — G309 Alzheimer's disease, unspecified: Secondary | ICD-10-CM | POA: Diagnosis present

## 2020-10-05 DIAGNOSIS — J690 Pneumonitis due to inhalation of food and vomit: Secondary | ICD-10-CM | POA: Diagnosis present

## 2020-10-05 DIAGNOSIS — E870 Hyperosmolality and hypernatremia: Secondary | ICD-10-CM | POA: Diagnosis present

## 2020-10-05 DIAGNOSIS — Z789 Other specified health status: Secondary | ICD-10-CM

## 2020-10-05 DIAGNOSIS — J189 Pneumonia, unspecified organism: Secondary | ICD-10-CM

## 2020-10-05 DIAGNOSIS — Z8673 Personal history of transient ischemic attack (TIA), and cerebral infarction without residual deficits: Secondary | ICD-10-CM | POA: Diagnosis not present

## 2020-10-05 DIAGNOSIS — F0281 Dementia in other diseases classified elsewhere with behavioral disturbance: Secondary | ICD-10-CM

## 2020-10-05 DIAGNOSIS — Z7982 Long term (current) use of aspirin: Secondary | ICD-10-CM | POA: Diagnosis not present

## 2020-10-05 DIAGNOSIS — G929 Unspecified toxic encephalopathy: Secondary | ICD-10-CM | POA: Diagnosis present

## 2020-10-05 DIAGNOSIS — J9601 Acute respiratory failure with hypoxia: Secondary | ICD-10-CM | POA: Diagnosis present

## 2020-10-05 DIAGNOSIS — R0989 Other specified symptoms and signs involving the circulatory and respiratory systems: Secondary | ICD-10-CM

## 2020-10-05 DIAGNOSIS — Z20822 Contact with and (suspected) exposure to covid-19: Secondary | ICD-10-CM | POA: Diagnosis present

## 2020-10-05 DIAGNOSIS — Z87891 Personal history of nicotine dependence: Secondary | ICD-10-CM | POA: Diagnosis not present

## 2020-10-05 LAB — APTT: aPTT: 24 seconds (ref 24–36)

## 2020-10-05 LAB — COMPREHENSIVE METABOLIC PANEL
ALT: 22 U/L (ref 0–44)
AST: 45 U/L — ABNORMAL HIGH (ref 15–41)
Albumin: 2.5 g/dL — ABNORMAL LOW (ref 3.5–5.0)
Alkaline Phosphatase: 81 U/L (ref 38–126)
Anion gap: 14 (ref 5–15)
BUN: 80 mg/dL — ABNORMAL HIGH (ref 8–23)
CO2: 18 mmol/L — ABNORMAL LOW (ref 22–32)
Calcium: 9 mg/dL (ref 8.9–10.3)
Chloride: 126 mmol/L — ABNORMAL HIGH (ref 98–111)
Creatinine, Ser: 3.23 mg/dL — ABNORMAL HIGH (ref 0.61–1.24)
GFR, Estimated: 18 mL/min — ABNORMAL LOW (ref >60)
Glucose, Bld: 120 mg/dL — ABNORMAL HIGH (ref 70–99)
Potassium: 4.2 mmol/L (ref 3.5–5.1)
Sodium: 158 mmol/L — ABNORMAL HIGH (ref 135–145)
Total Bilirubin: 0.8 mg/dL (ref 0.3–1.2)
Total Protein: 6.1 g/dL — ABNORMAL LOW (ref 6.5–8.1)

## 2020-10-05 LAB — CBC WITH DIFFERENTIAL/PLATELET
Abs Immature Granulocytes: 0.07 10*3/uL (ref 0.00–0.07)
Basophils Absolute: 0 10*3/uL (ref 0.0–0.1)
Basophils Relative: 0 %
Eosinophils Absolute: 0 10*3/uL (ref 0.0–0.5)
Eosinophils Relative: 0 %
HCT: 39.3 % (ref 39.0–52.0)
Hemoglobin: 11.2 g/dL — ABNORMAL LOW (ref 13.0–17.0)
Immature Granulocytes: 0 %
Lymphocytes Relative: 3 %
Lymphs Abs: 0.4 10*3/uL — ABNORMAL LOW (ref 0.7–4.0)
MCH: 26.9 pg (ref 26.0–34.0)
MCHC: 28.5 g/dL — ABNORMAL LOW (ref 30.0–36.0)
MCV: 94.2 fL (ref 80.0–100.0)
Monocytes Absolute: 1 10*3/uL (ref 0.1–1.0)
Monocytes Relative: 6 %
Neutro Abs: 14.4 10*3/uL — ABNORMAL HIGH (ref 1.7–7.7)
Neutrophils Relative %: 91 %
Platelets: 197 10*3/uL (ref 150–400)
RBC: 4.17 MIL/uL — ABNORMAL LOW (ref 4.22–5.81)
RDW: 20.2 % — ABNORMAL HIGH (ref 11.5–15.5)
WBC: 15.9 10*3/uL — ABNORMAL HIGH (ref 4.0–10.5)
nRBC: 0 % (ref 0.0–0.2)

## 2020-10-05 LAB — PROTIME-INR
INR: 1.2 (ref 0.8–1.2)
Prothrombin Time: 14.8 seconds (ref 11.4–15.2)

## 2020-10-05 LAB — CBG MONITORING, ED
Glucose-Capillary: 146 mg/dL — ABNORMAL HIGH (ref 70–99)
Glucose-Capillary: >600 mg/dL (ref 70–99)

## 2020-10-05 LAB — LACTIC ACID, PLASMA
Lactic Acid, Venous: 2.2 mmol/L (ref 0.5–1.9)
Lactic Acid, Venous: 2.9 mmol/L (ref 0.5–1.9)

## 2020-10-05 LAB — SARS CORONAVIRUS 2 BY RT PCR (HOSPITAL ORDER, PERFORMED IN ~~LOC~~ HOSPITAL LAB): SARS Coronavirus 2: NEGATIVE

## 2020-10-05 MED ORDER — ONDANSETRON HCL 4 MG/2ML IJ SOLN: 4.0000 mg | Freq: Four times a day (QID) | INTRAMUSCULAR | Status: DC | PRN

## 2020-10-05 MED ORDER — HYDROMORPHONE HCL 1 MG/ML IJ SOLN
0.5000 mg | INTRAMUSCULAR | Status: DC
  Administered 2020-10-05 – 2020-10-06 (×2): 0.5 mg via INTRAVENOUS
  Filled 2020-10-05 (×3): qty 1

## 2020-10-05 MED ORDER — DEXTROSE 5 % IV SOLN: INTRAVENOUS | Status: DC

## 2020-10-05 MED ORDER — HEPARIN SODIUM (PORCINE) 5000 UNIT/ML IJ SOLN: 5000.0000 [IU] | Freq: Three times a day (TID) | INTRAMUSCULAR | Status: DC

## 2020-10-05 MED ORDER — GLYCOPYRROLATE 0.2 MG/ML IJ SOLN: 0.2000 mg | INTRAMUSCULAR | Status: DC | PRN

## 2020-10-05 MED ORDER — ACETAMINOPHEN 650 MG RE SUPP
650.0000 mg | Freq: Four times a day (QID) | RECTAL | Status: DC | PRN
Start: 2020-10-05 — End: 2020-10-05

## 2020-10-05 MED ORDER — BIOTENE DRY MOUTH MT LIQD
15.0000 mL | Freq: Two times a day (BID) | OROMUCOSAL | Status: DC
  Administered 2020-10-06: 15 mL via TOPICAL

## 2020-10-05 MED ORDER — ONDANSETRON 4 MG PO TBDP: 4.0000 mg | ORAL_TABLET | Freq: Four times a day (QID) | ORAL | Status: DC | PRN

## 2020-10-05 MED ORDER — ACETAMINOPHEN 325 MG PO TABS: 650.0000 mg | ORAL_TABLET | Freq: Four times a day (QID) | ORAL | Status: DC | PRN

## 2020-10-05 MED ORDER — GLYCOPYRROLATE 0.2 MG/ML IJ SOLN
0.2000 mg | INTRAMUSCULAR | Status: DC
  Administered 2020-10-05 – 2020-10-06 (×4): 0.2 mg via INTRAVENOUS
  Filled 2020-10-05 (×3): qty 1

## 2020-10-05 MED ORDER — HALOPERIDOL LACTATE 5 MG/ML IJ SOLN: 2.0000 mg | Freq: Four times a day (QID) | INTRAMUSCULAR | Status: DC | PRN

## 2020-10-05 MED ORDER — POLYVINYL ALCOHOL 1.4 % OP SOLN
1.0000 [drp] | Freq: Four times a day (QID) | OPHTHALMIC | Status: DC | PRN
Start: 2020-10-05 — End: 2020-10-07
  Filled 2020-10-05: qty 15

## 2020-10-05 MED ORDER — SODIUM CHLORIDE 0.9 % IV SOLN
2.0000 g | INTRAVENOUS | Status: DC
  Administered 2020-10-05: 2 g via INTRAVENOUS
  Filled 2020-10-05: qty 20

## 2020-10-05 MED ORDER — HYDRALAZINE HCL 20 MG/ML IJ SOLN: 10.0000 mg | INTRAMUSCULAR | Status: DC | PRN

## 2020-10-05 MED ORDER — HYDROMORPHONE HCL 1 MG/ML IJ SOLN
0.5000 mg | INTRAMUSCULAR | Status: DC | PRN
  Administered 2020-10-06 (×3): 1 mg via INTRAVENOUS
  Filled 2020-10-05 (×3): qty 1

## 2020-10-05 MED ORDER — LORAZEPAM 2 MG/ML PO CONC: 1.0000 mg | ORAL | Status: DC | PRN

## 2020-10-05 MED ORDER — ACETAMINOPHEN 650 MG RE SUPP: 650.0000 mg | Freq: Four times a day (QID) | RECTAL | Status: DC | PRN

## 2020-10-05 MED ORDER — SODIUM CHLORIDE 0.9 % IV SOLN
500.0000 mg | INTRAVENOUS | Status: DC
  Administered 2020-10-05: 500 mg via INTRAVENOUS
  Filled 2020-10-05: qty 500

## 2020-10-05 MED ORDER — HALOPERIDOL LACTATE 2 MG/ML PO CONC
2.0000 mg | Freq: Four times a day (QID) | ORAL | Status: DC | PRN
  Filled 2020-10-05: qty 1

## 2020-10-05 MED ORDER — LORAZEPAM 2 MG/ML IJ SOLN: 1.0000 mg | INTRAMUSCULAR | Status: DC | PRN

## 2020-10-05 NOTE — ED Notes (Signed)
PT 02 came back up while on 15 L NRB. Saulters placed at bedside as a prophylactic measure.

## 2020-10-05 NOTE — H&P (Signed)
History and Physical    KIERAN RIMER [id number] DOB: 1929/04/15 DOA: 09/13/2020  PCP: Gayland Curry, DO  Patient coming from: Skilled nursing facility.  Chief Complaint: Shortness of breath.  HPI: Jose Baldwin is a 87 y.o. male with known history of Parkinson's disease, dementia, hypertension, anemia, chronic kidney disease stage III was brought to the ER after patient was found to be increasingly short of breath and altered.  Per report patient was recently diagnosed with pneumonia and was given antibiotics.  ED Course: In the ER patient was febrile with temperature of 101.4 F and patient was hypoxic requiring nonrebreather chest x-ray shows infiltrates consistent with pneumonia Covid test was negative.  Labs are significant for acute renal failure with creatinine increasing from 1.58 is around 3.2 sodium is 158 WBC 15.9 lactic acid 2.9 hemoglobin 11.2.  Patient had features consistent with sepsis was started on empiric antibiotics and fluids after blood cultures obtained.  Patient appears to be encephalopathic and not able to communicate.  Review of Systems: As per HPI, rest all negative.   Past Medical History:  Diagnosis Date  . Alzheimer's dementia ([place])   . Hypertension   . Parkinson disease ([place])   . Prostate cancer ([place])   . Renal disorder   . Syncope     Past Surgical History:  Procedure Laterality Date  . BACK SURGERY    . IR VERTEBROPLASTY CERV/THOR BX INC UNI/BIL INC/INJECT/IMAGING  01/31/2018  . IR VERTEBROPLASTY CERV/THOR BX INC UNI/BIL INC/INJECT/IMAGING  03/24/2019     reports that he quit smoking about 32 years ago. His smoking use included cigarettes. He has a 60.00 pack-year smoking history. He has never used smokeless tobacco. He reports current alcohol use of about 14.0 standard drinks of alcohol per week. He reports that he does not use drugs.  No Known Allergies  Family History  Problem Relation Age of Onset  . Heart attack Father 24    Prior  to Admission medications   Medication Sig Start Date End Date Taking? Authorizing Provider  amLODipine (NORVASC) 2.5 MG tablet Take 1 tablet (2.5 mg total) by mouth daily. 05/26/19  Yes Hongalgi, Lenis Dickinson, MD  ascorbic acid (VITAMIN C) 500 MG tablet Take 500 mg by mouth daily.   Yes [provider]  aspirin 81 MG chewable tablet Chew 1 tablet (81 mg total) by mouth daily. 04/01/19  Yes Nita Sells, MD  carbidopa-levodopa (SINEMET IR) 25-250 MG tablet Take 1 tablet by mouth 3 (three) times daily. 9am, 1pm, 7pm   Yes [provider]  Carboxymethylcellulose Sodium (ARTIFICIAL TEARS OP) Place 1 drop into both eyes 4 (four) times daily. For Dry Eyes 07/05/20  Yes [provider]  Cholecalciferol (D3-50) 1.25 MG (50000 UT) capsule Take 50,000 Units by mouth every 30 (thirty) days.   Yes [provider]  cloNIDine (CATAPRES) 0.2 MG tablet Take 0.2 mg by mouth 2 (two) times daily.   Yes [provider]  Dextromethorphan-guaiFENesin (ROBITUSSIN COUGH+CHEST CONG DM PO) Take 20 mLs by mouth 2 (two) times daily as needed (cough).  03/23/20  Yes [provider]  Ensure (ENSURE) Take 237 mLs by mouth 2 (two) times daily between meals.   Yes [provider]  Eyelid Cleansers (OCUSOFT LID SCRUB PLUS) PADS Apply topically. Cleanse both eyelid, includes eyebrows and forehead twice daily to maintain good eye hygiene 08/01/20  Yes [provider]  ferrous sulfate 220 (44 Fe) MG/5ML solution Take 220 mg by mouth in the  morning and at bedtime.   Yes [provider]  moxifloxacin (AVELOX) 400 MG tablet Take 400 mg by mouth See admin instructions. Hs x 7 days 10/01/20 10/07/20 Yes [provider]  Multiple Vitamin (MULTIVITAMIN WITH MINERALS) TABS tablet Take 1 tablet by mouth daily. 05/26/19  Yes Hongalgi, Lenis Dickinson, MD  omeprazole (PRILOSEC) 40 MG capsule Take 40 mg by mouth daily.   Yes [provider]  ondansetron (ZOFRAN)  4 MG tablet Take 4 mg by mouth every 6 (six) hours as needed for nausea or vomiting.   Yes [provider]  polyethylene glycol (MIRALAX / GLYCOLAX) 17 g packet Take 17 g by mouth daily as needed for mild constipation. 04/23/19  Yes Aline August, MD  saccharomyces boulardii (FLORASTOR) 250 MG capsule Take 250 mg by mouth See admin instructions. Bid x 7 days 10/01/20 10/07/20 Yes [provider]  thiamine (VITAMIN B-1) 100 MG tablet Take 100 mg by mouth daily. Take at 1 PM   Yes [provider]  cefTRIAXone (ROCEPHIN) 1 g injection Inject 1 g into the muscle once. Mix with 2.1 ml of Lidocaine and give IM x 1 dose Patient not taking: Reported on 10/05/2020 09/29/20   [provider]  feeding supplement, ENSURE ENLIVE, (ENSURE ENLIVE) LIQD Take 237 mLs by mouth 2 (two) times daily between meals. Patient not taking: Reported on 10/05/2020 05/25/19   Modena Jansky, MD  ferrous sulfate 325 (65 FE) MG tablet Take 325 mg by mouth 2 (two) times daily with a meal. Patient not taking: No sig reported    [provider]  sodium chloride 0.9 % infusion Inject 50 mLs into the vein See admin instructions. Infuse ns at 50 cc/hr 1 bag Patient not taking: Reported on 10/05/2020 09/30/20   [provider]  sodium chloride 0.9 % SOLN 1,000 mLs. 1,000 ML :Infuse NS at 50 cc/hr x 1 bag Patient not taking: Reported on 10/05/2020    [provider]    Physical Exam: Constitutional: Moderately built and nourished. Vitals:   11/03/2020 2327 11-03-20 2333 10/05/20 0015 10/05/20 0030  BP:  (!) 101/54 126/62 123/63  Pulse:  73 94 95  Resp:  (!) 31 19 (!) 30  Temp:  (!) 101.3 F (38.5 C)    TempSrc:  Axillary    SpO2: (!) 86% 90% 100% 99%   Eyes: Anicteric no pallor. ENMT: No discharge from the ears eyes nose or mouth. Neck: No mass felt.  No neck rigidity. Respiratory: No rhonchi or crepitations. Cardiovascular: S1-S2 heard. Abdomen: Soft nontender bowel  sound present. Musculoskeletal: No edema. Skin: No rash. Neurologic: Encephalopathic. Psychiatric: Encephalopathic.   Labs on Admission: I have personally reviewed following labs and imaging studies  CBC: Recent Labs  Lab 11/03/2020 2335  WBC 15.9*  NEUTROABS 14.4*  HGB 11.2*  HCT 39.3  MCV 94.2  PLT 607   Basic Metabolic Panel: Recent Labs  Lab 11/03/2020 2335  NA 158*  K 4.2  CL 126*  CO2 18*  GLUCOSE 120*  BUN 80*  CREATININE 3.23*  CALCIUM 9.0   GFR: Estimated Creatinine Clearance: 12 mL/min (A) (by C-G formula based on SCr of 3.23 mg/dL (H)). Liver Function Tests: Recent Labs  Lab 11/03/20 2335  AST 45*  ALT 22  ALKPHOS 81  BILITOT 0.8  PROT 6.1*  ALBUMIN 2.5*   No results for input(s): LIPASE, AMYLASE in the last 168 hours. No results for input(s): AMMONIA in the last 168 hours. Coagulation Profile:  Recent Labs  Lab 09/22/2020 2335  INR 1.2   Cardiac Enzymes: No results for input(s): CKTOTAL, CKMB, CKMBINDEX, TROPONINI in the last 168 hours. BNP (last 3 results) No results for input(s): PROBNP in the last 8760 hours. HbA1C: No results for input(s): HGBA1C in the last 72 hours. CBG: No results for input(s): GLUCAP in the last 168 hours. Lipid Profile: No results for input(s): CHOL, HDL, LDLCALC, TRIG, CHOLHDL, LDLDIRECT in the last 72 hours. Thyroid Function Tests: No results for input(s): TSH, T4TOTAL, FREET4, T3FREE, THYROIDAB in the last 72 hours. Anemia Panel: No results for input(s): VITAMINB12, FOLATE, FERRITIN, TIBC, IRON, RETICCTPCT in the last 72 hours. Urine analysis:    Component Value Date/Time   COLORURINE YELLOW 06/01/2019 2137   APPEARANCEUR CLEAR 06/01/2019 2137   LABSPEC 1.016 06/01/2019 2137   PHURINE 6.0 06/01/2019 2137   GLUCOSEU NEGATIVE 06/01/2019 2137   HGBUR NEGATIVE 06/01/2019 2137   BILIRUBINUR NEGATIVE 06/01/2019 2137   KETONESUR 5 (A) 06/01/2019 2137   PROTEINUR NEGATIVE 06/01/2019 2137   NITRITE NEGATIVE  06/01/2019 2137   LEUKOCYTESUR NEGATIVE 06/01/2019 2137   Sepsis Labs: @LABRCNTIP (procalcitonin:4,lacticidven:4) ) Recent Results (from the past 240 hour(s))  SARS Coronavirus 2 by RT PCR (hospital order, performed in [facility] hospital lab) Nasopharyngeal Nasopharyngeal Swab     Status: None   Collection Time: 10/07/2020 11:36 PM   Specimen: Nasopharyngeal Swab  Result Value Ref Range Status   SARS Coronavirus 2 NEGATIVE NEGATIVE Final    Comment: (NOTE) SARS-CoV-2 target nucleic acids are NOT DETECTED.  The SARS-CoV-2 RNA is generally detectable in upper and lower respiratory specimens during the acute phase of infection. The lowest concentration of SARS-CoV-2 viral copies this assay can detect is 250 copies / mL. A negative result does not preclude SARS-CoV-2 infection and should not be used as the sole basis for treatment or other patient management decisions.  A negative result may occur with improper specimen collection / handling, submission of specimen other than nasopharyngeal swab, presence of viral mutation(s) within the areas targeted by this assay, and inadequate number of viral copies (<250 copies / mL). A negative result must be combined with clinical observations, patient history, and epidemiological information.  Fact Sheet for Patients:   StrictlyIdeas.no  Fact Sheet for Healthcare Providers: BankingDealers.co.za  This test is not yet approved or  cleared by the [place] FDA and has been authorized for detection and/or diagnosis of SARS-CoV-2 by FDA under an Emergency Use Authorization (EUA).  This EUA will remain in effect (meaning this test can be used) for the duration of the COVID-19 declaration under Section 564(b)(1) of the Act, 21 U.S.C. section 360bbb-3(b)(1), unless the authorization is terminated or revoked sooner.  Performed at [facility], [place] [address] [place], [place] [address]       Radiological Exams on Admission: DG Chest Port 1 View  Result Date: 09/12/2020 CLINICAL DATA:  Sepsis, progressive hypoxia EXAM: PORTABLE CHEST 1 VIEW COMPARISON:  07/25/2019 FINDINGS: The lungs are symmetrically hyperinflated in keeping with changes of underlying COPD. There has developed extensive airspace infiltrate within the right lung base, likely infectious in the appropriate clinical setting. No pneumothorax or pleural effusion. Cardiac size within normal limits. Multiple healed right rib fractures are noted. T12 vertebroplasty has been performed. IMPRESSION: Extensive right basilar infiltrate, likely infectious in the acute setting. COPD Electronically Signed   By: Fidela Salisbury MD   On: 10/02/2020 23:48    EKG: Independently reviewed.  Normal sinus rhythm.  Assessment/Plan Principal Problem:   Sepsis ([place]) Active Problems:   HTN (hypertension)   Prostate cancer ([place])   Parkinson disease ([place])   Dementia with behavioral disturbance ([place])   CAP (community acquired pneumonia)   ARF (acute renal failure) ([facility])   Hypernatremia    1. Sepsis secondary to pneumonia for which patient is on empiric antibiotics follow cultures.  Get speech therapy evaluation.  We will keep patient n.p.o. for now.  Continue with aggressive hydration follow lactic acid level procalcitonin levels. 2. Acute renal failure with hyponatremia could be from poor oral intake.  We will keep patient on D5W and follow metabolic panel closely. 3. Acute encephalopathy with history of dementia likely worsened because of sepsis. 4. History of Parkinson's disease on Sinemet which may need to be given through NG tube if patient continues to remain encephalopathic. 5. History of hypertension presently on a as needed IV hydralazine since patient cannot swallow. 6. History of anemia follow CBC.  Since patient has sepsis with pneumonia will need close monitoring for any further worsening and inpatient status.   DVT  prophylaxis: Lovenox. Code Status: DNR. Family Communication: Tried to reach patient's son multiple times unable to. Disposition Plan: Back to facility when stable. Consults called: Speech therapy and palliative care. Admission status: Inpatient.   Rise Patience MD [facility] Pager [phone].  If 7PM-7AM, please contact night-coverage [email or web address] Password [facility]  10/05/2020, 3:15 AM

## 2020-10-05 NOTE — Progress Notes (Signed)
Stclair Szymborski is a 87 year old male with a known history of Parkinson's disease, dementia, hypertension, anemia and multiple other comorbidities who presented to ED from his skilled nursing facility with shortness of breath.  He was admitted to hospital service with sepsis secondary to possible aspiration pneumonia.  In fact he meets criteria for severe sepsis due to fever, tachypnea, leukocytosis, lactic acid 3.2 as well as acute encephalopathy.  Patient seen and examined this morning in the ED, he was on nonrebreather as well as 15 L high flow oxygen and despite of that his oxygen saturation were 84%.  Patient was encephalopathic and unable to have any conversation.  On examination, he had gross rhonchi on the right side.  I had a lengthy discussion with patient's son Evangeline Gula and his wife over the phone.  Dinan was already prepared for his father to decline and possibly passed away.  He was requesting to allow him to visit the father.  After lengthy discussion, he was agreeable to have palliative care see him and likely transitioning to hospice.  Unfortunately, palliative care could not see him for few hours and patient continued to decline requiring more oxygen.  I had another discussion with the dad and over the phone.  He is very clear about his decision and he is agreeable with transition patient to full comfort care.  Primary nurse of the patient has been informed.

## 2020-10-05 NOTE — ED Notes (Signed)
Admitting notified of critical lactic

## 2020-10-05 NOTE — ED Notes (Signed)
PT 02 sat declining to lower 80's. PT placed on 15 L NRB and 15 L [place].

## 2020-10-05 NOTE — Consult Note (Signed)
Consultation Note Date: 10/05/2020   Patient Name: Jose Baldwin  DOB: 1928-12-14  MRN: [id number]  Age / Sex: 87 y.o., male  PCP: Jose Curry, DO Referring Physician: Darliss Cheney, MD  Reason for Consultation: Disposition, Establishing goals of care, Non pain symptom management, Pain control, Psychosocial/spiritual support and Terminal Care  HPI/Patient Profile: 87 y.o. male  with past medical history of Parkinson's disease, dementia, HTN, CKD, prostate cancer presented to the ED from [place] with shortness of breath. Patient was admitted on 10/01/2020 with sepsis secondary to pneumonia, acute encephalopathy, and acute renal failure. While in ED patient continued to decline and family decided to initiate full comfort measures.  ED Course: In the ER patient was febrile with temperature of 101.4 F and patient was hypoxic requiring nonrebreather chest x-ray shows infiltrates consistent with pneumonia Covid test was negative.  Labs are significant for acute renal failure with creatinine increasing from 1.58 is around 3.2 sodium is 158 WBC 15.9 lactic acid 2.9 hemoglobin 11.2.  Patient had features consistent with sepsis was started on empiric antibiotics and fluids after blood cultures obtained.  Patient appears to be encephalopathic and not able to communicate.  Clinical Assessment and Goals of Care: I have reviewed medical records including EPIC notes, labs, and imaging. Received report from primary RN - no acute concerns.   Went to visit patient at bedside - son/Jose and Jose Baldwin/Jose Baldwin were present. Patient was lying in bed awake and alert - he is not able to participate in conversation but seems to understand discussion around him as he smiles and can answer "yes" and "no" with head nods. No signs or non-verbal gestures of pain or discomfort noted. No respiratory distress or increased work of breathing;  secretions were noted. Patient denied pain but endorses feeling short of breath. He has [place] on his face but it is not in his nose - family state that he finds [place] very uncomfortable and drying.   Met with patient, son/Jose, and Jose Baldwin/Jose Baldwin  to discuss diagnosis, prognosis, GOC, EOL wishes, disposition, and options.  I introduced Palliative Medicine as specialized medical care for people living with serious illness. It focuses on providing relief from the symptoms and stress of a serious illness. The goal is to improve quality of life for both the patient and the family.  Family were clear that the goal at this time is to keep the patient comfortable. They stated, "we don't know what the next steps are." We talked about transition to comfort measures in house and what that would entail inclusive of medications to control pain, dyspnea, agitation, nausea, itching, and hiccups. We discussed stopping all unnecessary measures such as blood draws, needle sticks, oxygen, antibiotics, CBGs/insulin, cardiac monitoring, and frequent vital signs. Family were agreeable to proceed with full comfort care. Patient was not agreeable to stop oxygen today - explained he can continue to utilize oxygen for comfort if that were his wish and we would also manage his shortness of breath with medications.   Expectations at EOL were  reviewed - "Gone From My Sight" book was provided. Prognostication was discussed per family request.   Discussed symptom management plan - patient and family were agreeable. Family state that the patient has a hard time swallowing - explained we could utilize other routes for medications than PO - family were appreciative.   Hospice (residential and home hospice) were explained and offered. After discussion, family state they are not interested in transfer at this time and wish for patient to remain in house for EOL care. Reviewed current [place] EOL visitation policy - family expressed  understanding.   Visit also consisted of discussions dealing with the complex and emotionally intense issues of symptom management and palliative care in the setting of serious and life-threatening illness. Palliative care team will continue to support patient, patient's family, and medical team.  Discussed with patient/family the importance of continued conversation with each other and the medical providers regarding overall plan of care and treatment options, ensuring decisions are within the context of the patient's values and GOCs.    Questions and concerns were addressed. The patient/family was encouraged to call with questions and/or concerns. PMT card was provided.    Primary Decision Maker: HCPOA - Jose Baldwin/son    SUMMARY OF RECOMMENDATIONS: Continue full comfort measures Continue DNR/DNI as previously documented Family do not want transfer to residential hospice, they wish for patient to remain in house for EOL - will be hospital death - likely hours to days Added orders for EOL symptom management and to reflect full comfort measures, as well as discontinued orders that were not focused on comfort Unrestricted visitation orders were placed per current [place] EOL visitation policy  Provide frequent assessments and administer PRN medications as clinically necessary to ensure EOL comfort PMT will continue to follow holistically     Code Status/Advance Care Planning:  DNR  Palliative Prophylaxis:   Aspiration, Bowel Regimen, Delirium Protocol, Frequent Pain Assessment, Oral Care and Turn Reposition  Additional Recommendations (Limitations, Scope, Preferences):  Full Comfort Care  Psycho-social/Spiritual:  Created space and opportunity for patient and family to express thoughts and feelings regarding patient's current medical situation.   Emotional support and therapeutic listening provided.  Prognosis:   Hours - Days  Discharge Planning: Anticipated  Hospital Death      Primary Diagnoses: Present on Admission: . Sepsis ([place]) . HTN (hypertension) . Prostate cancer ([place]) . Parkinson disease ([place]) . Dementia with behavioral disturbance ([place]) . CAP (community acquired pneumonia) . ARF (acute renal failure) ([place]) . Hypernatremia   I have reviewed the medical record, interviewed the patient and family, and examined the patient. The following aspects are pertinent.  Past Medical History:  Diagnosis Date  . Alzheimer's dementia ([place])   . Hypertension   . Parkinson disease ([place])   . Prostate cancer ([place])   . Renal disorder   . Syncope    Social History   Socioeconomic History  . Marital status: Single    Spouse name: Not on file  . Number of children: 3  . Years of education: Not on file  . Highest education level: Not on file  Occupational History  . Occupation: retired  Tobacco Use  . Smoking status: Former Smoker    Packs/day: 2.00    Years: 30.00    Pack years: 60.00    Types: Cigarettes    Quit date: 1990    Years since quitting: 32.0  . Smokeless tobacco: Never Used  Vaping Use  . Vaping Use: Never  used  Substance and Sexual Activity  . Alcohol use: Yes    Alcohol/week: 14.0 standard drinks    Types: 14 Cans of beer per week  . Drug use: Never  . Sexual activity: Not on file  Other Topics Concern  . Not on file  Social History Narrative   Reports that he quit smoking about 30 years ago. His smoking use included cigarettes. He has a 60.00 pack-year smoking history. He has never used smokeless tobacco. He reports current alcohol use of about 14.0 standard drinks of alcohol per week-none in snf. He reports that he does not use drugs.     Social Determinants of Health   Financial Resource Strain: Not on file  Food Insecurity: Not on file  Transportation Needs: Not on file  Physical Activity: Not on file  Stress: Not on file  Social Connections: Not on file   Family History  Problem Relation Age of  Onset  . Heart attack Father 19   Scheduled Meds: . antiseptic oral rinse  15 mL Topical BID  . glycopyrrolate  0.2 mg Intravenous Q4H  .  HYDROmorphone (DILAUDID) injection  0.5 mg Intravenous Q4H   Continuous Infusions: PRN Meds:.acetaminophen **OR** acetaminophen, glycopyrrolate **OR** glycopyrrolate, haloperidol **OR** haloperidol lactate, HYDROmorphone (DILAUDID) injection, LORazepam **OR** LORazepam, ondansetron **OR** ondansetron (ZOFRAN) IV, polyvinyl alcohol Medications Prior to Admission:  Prior to Admission medications   Medication Sig Start Date End Date Taking? Authorizing Provider  amLODipine (NORVASC) 2.5 MG tablet Take 1 tablet (2.5 mg total) by mouth daily. 05/26/19  Yes Hongalgi, Lenis Dickinson, MD  ascorbic acid (VITAMIN C) 500 MG tablet Take 500 mg by mouth daily.   Yes [provider]  aspirin 81 MG chewable tablet Chew 1 tablet (81 mg total) by mouth daily. 04/01/19  Yes Nita Sells, MD  carbidopa-levodopa (SINEMET IR) 25-250 MG tablet Take 1 tablet by mouth 3 (three) times daily. 9am, 1pm, 7pm   Yes [provider]  Carboxymethylcellulose Sodium (ARTIFICIAL TEARS OP) Place 1 drop into both eyes 4 (four) times daily. For Dry Eyes 07/05/20  Yes [provider]  Cholecalciferol (D3-50) 1.25 MG (50000 UT) capsule Take 50,000 Units by mouth every 30 (thirty) days.   Yes [provider]  cloNIDine (CATAPRES) 0.2 MG tablet Take 0.2 mg by mouth 2 (two) times daily.   Yes [provider]  Dextromethorphan-guaiFENesin (ROBITUSSIN COUGH+CHEST CONG DM PO) Take 20 mLs by mouth 2 (two) times daily as needed (cough).  03/23/20  Yes [provider]  Ensure (ENSURE) Take 237 mLs by mouth 2 (two) times daily between meals.   Yes [provider]  Eyelid Cleansers (OCUSOFT LID SCRUB PLUS) PADS Apply topically. Cleanse both eyelid, includes eyebrows and forehead twice daily to maintain good eye hygiene 08/01/20  Yes [provider]  ferrous sulfate 220 (44 Fe) MG/5ML solution Take 220 mg by mouth in the morning and at bedtime.   Yes [provider]  moxifloxacin (AVELOX) 400 MG tablet Take 400 mg by mouth See admin instructions. Hs x 7 days 10/01/20 10/07/20 Yes [provider]  Multiple Vitamin (MULTIVITAMIN WITH MINERALS) TABS tablet Take 1 tablet by mouth daily. 05/26/19  Yes Hongalgi, Lenis Dickinson, MD  omeprazole (PRILOSEC) 40 MG capsule Take 40 mg by mouth daily.   Yes [provider]  ondansetron (ZOFRAN) 4 MG tablet Take 4 mg by mouth every 6 (six) hours as needed for nausea or vomiting.   Yes [provider]  polyethylene glycol (  MIRALAX / GLYCOLAX) 17 g packet Take 17 g by mouth daily as needed for mild constipation. 04/23/19  Yes Aline August, MD  saccharomyces boulardii (FLORASTOR) 250 MG capsule Take 250 mg by mouth See admin instructions. Bid x 7 days 10/01/20 10/07/20 Yes [provider]  thiamine (VITAMIN B-1) 100 MG tablet Take 100 mg by mouth daily. Take at 1 PM   Yes [provider]  cefTRIAXone (ROCEPHIN) 1 g injection Inject 1 g into the muscle once. Mix with 2.1 ml of Lidocaine and give IM x 1 dose Patient not taking: Reported on 10/05/2020 09/29/20   [provider]  feeding supplement, ENSURE ENLIVE, (ENSURE ENLIVE) LIQD Take 237 mLs by mouth 2 (two) times daily between meals. Patient not taking: Reported on 10/05/2020 05/25/19   Modena Jansky, MD  ferrous sulfate 325 (65 FE) MG tablet Take 325 mg by mouth 2 (two) times daily with a meal. Patient not taking: No sig reported    [provider]  sodium chloride 0.9 % infusion Inject 50 mLs into the vein See admin instructions. Infuse ns at 50 cc/hr 1 bag Patient not taking: Reported on 10/05/2020 09/30/20   [provider]  sodium chloride 0.9 % SOLN 1,000 mLs. 1,000 ML :Infuse NS at 50 cc/hr x 1 bag Patient not taking: Reported on 10/05/2020    [provider]    No Known Allergies Review of Systems  Respiratory: Positive for shortness of breath.   Neurological: Positive for weakness.  All other systems reviewed and are negative.   Physical Exam Vitals and nursing note reviewed.  Constitutional:      General: He is not in acute distress.    Appearance: He is ill-appearing.  Pulmonary:     Effort: No respiratory distress.     Comments: Secretions noted Skin:    General: Skin is warm and dry.  Neurological:     Mental Status: He is alert.     Motor: Weakness present.  Psychiatric:        Speech: He is noncommunicative.        Behavior: Behavior is cooperative.     Vital Signs: BP (!) 148/76   Pulse 80   Temp (!) 101.3 F (38.5 C) (Axillary)   Resp 19   SpO2 (!) 78%          SpO2: SpO2: (!) 78 % O2 Device:SpO2: (!) 78 % O2 Flow Rate: .O2 Flow Rate (L/min): 15 L/min  IO: Intake/output summary:   Intake/Output Summary (Last 24 hours) at 10/05/2020 1656 Last data filed at 10/05/2020 1416 Gross per 24 hour  Intake 3173.53 ml  Output --  Net 3173.53 ml    LBM:   Baseline Weight:   Most recent weight:       Palliative Assessment/Data: PPS 10%     Time In: 1600 Time Out: 1712 Time Total: 72 minutes  Greater than 50%  of this time was spent counseling and coordinating care related to the above assessment and plan.  Signed by: Lin Landsman, NP   Please contact Palliative Medicine Team phone at [phone] for questions and concerns.  For individual provider: See Shea Evans

## 2020-10-05 NOTE — Sepsis Progress Note (Signed)
Sepsis protocol is being monitored by eLink. 

## 2020-10-05 NOTE — ED Notes (Signed)
Daughter-in-law at bedside, comfort measures initiated.

## 2020-10-05 NOTE — ED Notes (Signed)
Attempted to give reportx1 

## 2020-10-05 NOTE — ED Notes (Addendum)
Disregard CBG at 341- contaminated sample. CBG retake was 146.

## 2020-10-05 NOTE — ED Notes (Addendum)
MD Hal Hope paged for pt 02 sat in 70's with 15 L NRB.  RT called to bedside.

## 2020-10-06 ENCOUNTER — Other Ambulatory Visit: Payer: Self-pay

## 2020-10-06 DIAGNOSIS — R6520 Severe sepsis without septic shock: Secondary | ICD-10-CM | POA: Diagnosis not present

## 2020-10-06 DIAGNOSIS — A419 Sepsis, unspecified organism: Secondary | ICD-10-CM | POA: Diagnosis not present

## 2020-10-06 DIAGNOSIS — N179 Acute kidney failure, unspecified: Secondary | ICD-10-CM | POA: Diagnosis not present

## 2020-10-06 DIAGNOSIS — G20 Parkinson's disease: Secondary | ICD-10-CM | POA: Diagnosis not present

## 2020-10-06 DIAGNOSIS — J189 Pneumonia, unspecified organism: Secondary | ICD-10-CM | POA: Diagnosis not present

## 2020-10-06 MED ORDER — GLYCOPYRROLATE 0.2 MG/ML IJ SOLN
0.2000 mg | Freq: Four times a day (QID) | INTRAMUSCULAR | Status: DC
  Administered 2020-10-06: 0.2 mg via INTRAVENOUS
  Filled 2020-10-06: qty 1

## 2020-10-07 LAB — GLUCOSE, CAPILLARY: Glucose-Capillary: >600 mg/dL (ref 70–99)

## 2020-10-10 LAB — CULTURE, BLOOD (ROUTINE X 2)
Culture: NO GROWTH
Special Requests: ADEQUATE

## 2020-10-11 NOTE — Progress Notes (Signed)
PROGRESS NOTE    Jose Baldwin  YTW:[id number] DOB: [birth year] DOA: 09/18/2020 PCP: Gayland Curry, DO   Brief Narrative:  HPI: Jose Baldwin is a 87 y.o. male with known history of Parkinson's disease, dementia, hypertension, anemia, chronic kidney disease stage III was brought to the ER after patient was found to be increasingly short of breath and altered.  Per report patient was recently diagnosed with pneumonia and was given antibiotics.  ED Course: In the ER patient was febrile with temperature of 101.4 F and patient was hypoxic requiring nonrebreather chest x-ray shows infiltrates consistent with pneumonia Covid test was negative.  Labs are significant for acute renal failure with creatinine increasing from 1.58 is around 3.2 sodium is 158 WBC 15.9 lactic acid 2.9 hemoglobin 11.2.  Patient had features consistent with sepsis was started on empiric antibiotics and fluids after blood cultures obtained.  Patient appears to be encephalopathic and not able to communicate.   Assessment & Plan:   Principal Problem:   Sepsis ([place]) Active Problems:   HTN (hypertension)   Prostate cancer ([place])   Parkinson disease ([place])   Dementia with behavioral disturbance ([place])   CAP (community acquired pneumonia)   ARF (acute renal failure) ([place])   Hypernatremia  Patient was admitted with acute hypoxic respiratory failure and acute encephalopathy secondary to severe sepsis due to possible aspiration event.  Patient met severe sepsis criteria based on fever, tachycardia, leukocytosis, acute encephalopathy and hypoxia.  Patient was started on broad-spectrum IV antibiotics.  I had a conversation with patient's son and daughter-in-law and informed them about poor prognosis.  They were already prepared to make a decision for hospice.  Based on the discussion, patient was transition to full comfort care, palliative care was consulted.  Family does not want patient to be moved to hospice facility.  Patient is  expected to pass in the hospital.  He is very comfortable this morning.  DVT prophylaxis:    Code Status: DNR  Family Communication: Son present at bedside.  Long discussion.  Status is: Inpatient  Remains inpatient appropriate because:Inpatient level of care appropriate due to severity of illness   Dispo: The patient is from: Home              Anticipated d/c is to: Expect hospital death.              Anticipated d/c date is: 1 day              Patient currently is not medically stable to d/c.   Difficult to place patient No        Estimated body mass index is 15.71 kg/m as calculated from the following:   Height as of this encounter: _0  (1.854 m).   Weight as of this encounter: 54 kg.      Nutritional status:               Consultants:   None  Procedures:   None  Antimicrobials:  Anti-infectives (From admission, onward)   Start     Dose/Rate Route Frequency Ordered Stop   10/05/20 0315  cefTRIAXone (ROCEPHIN) 2 g in sodium chloride 0.9 % 100 mL IVPB  Status:  Discontinued        2 g 200 mL/hr over 30 Minutes Intravenous Every 24 hours 10/05/20 0313 10/05/20 1655   10/05/20 0315  azithromycin (ZITHROMAX) 500 mg in sodium chloride 0.9 % 250 mL IVPB  Status:  Discontinued  500 mg 250 mL/hr over 60 Minutes Intravenous Every 24 hours 10/05/20 0313 10/05/20 1655   09/12/2020 2345  ceFEPIme (MAXIPIME) 2 g in sodium chloride 0.9 % 100 mL IVPB        2 g 200 mL/hr over 30 Minutes Intravenous  Once 09/30/2020 2335 10/05/20 0037   09/14/2020 2345  metroNIDAZOLE (FLAGYL) IVPB 500 mg        500 mg 100 mL/hr over 60 Minutes Intravenous  Once 09/30/2020 2335 10/05/20 0243   09/14/2020 2345  vancomycin (VANCOCIN) IVPB 1000 mg/200 mL premix        1,000 mg 200 mL/hr over 60 Minutes Intravenous  Once 10/05/2020 2335 10/05/20 0407         Subjective: Patient seen and examined.  He was very comfortable this morning.  Son was at the bedside.  Patient was lethargic  though.  Could not have any conversation.  Objective: Vitals:   10/05/20 1200 10/05/20 1845 10-31-2020 0235 Oct 31, 2020 0445  BP: (!) 148/76 (!) 97/54  (!) 99/55  Pulse: 80 95  74  Resp: _0 Temp:    98.2 F (36.8 C)  TempSrc:    Oral  SpO2: (!) 78% (!) 64%  (!) 86%  Weight:   54 kg   Height:   _1  (1.854 m)     Intake/Output Summary (Last 24 hours) at 10-31-2020 1328 Last data filed at 31-Oct-2020 1000 Gross per 24 hour  Intake 1029.07 ml  Output --  Net 1029.07 ml   Filed Weights   10-31-2020 0235  Weight: 54 kg    Examination:  General exam: Appears calm and comfortable but lethargic Respiratory system: Rhonchi bilaterally. Respiratory effort normal. Cardiovascular system: S1 & S2 heard, RRR. No JVD, murmurs, rubs, gallops or clicks. No pedal edema. Gastrointestinal system: Abdomen is nondistended, soft and nontender. No organomegaly or masses felt. Normal bowel sounds heard.   Data Reviewed: I have personally reviewed following labs and imaging studies  CBC: Recent Labs  Lab 09/19/2020 2335  WBC 15.9*  NEUTROABS 14.4*  HGB 11.2*  HCT 39.3  MCV 94.2  PLT 412   Basic Metabolic Panel: Recent Labs  Lab 09/21/2020 2335  NA 158*  K 4.2  CL 126*  CO2 18*  GLUCOSE 120*  BUN 80*  CREATININE 3.23*  CALCIUM 9.0   GFR: Estimated Creatinine Clearance: 11.8 mL/min (A) (by C-G formula based on SCr of 3.23 mg/dL (H)). Liver Function Tests: Recent Labs  Lab 10/09/2020 2335  AST 45*  ALT 22  ALKPHOS 81  BILITOT 0.8  PROT 6.1*  ALBUMIN 2.5*   No results for input(s): LIPASE, AMYLASE in the last 168 hours. No results for input(s): AMMONIA in the last 168 hours. Coagulation Profile: Recent Labs  Lab 09/24/2020 2335  INR 1.2   Cardiac Enzymes: No results for input(s): CKTOTAL, CKMB, CKMBINDEX, TROPONINI in the last 168 hours. BNP (last 3 results) No results for input(s): PROBNP in the last 8760 hours. HbA1C: No results for input(s): HGBA1C in the last 72  hours. CBG: Recent Labs  Lab 10/05/20 0340 10/05/20 0343  GLUCAP >600* 146*   Lipid Profile: No results for input(s): CHOL, HDL, LDLCALC, TRIG, CHOLHDL, LDLDIRECT in the last 72 hours. Thyroid Function Tests: No results for input(s): TSH, T4TOTAL, FREET4, T3FREE, THYROIDAB in the last 72 hours. Anemia Panel: No results for input(s): VITAMINB12, FOLATE, FERRITIN, TIBC, IRON, RETICCTPCT in the last 72 hours. Sepsis Labs: Recent Labs  Lab 10/08/2020 2348 10/05/20 8786  LATICACIDVEN 2.9* 2.2*    Recent Results (from the past 240 hour(s))  SARS Coronavirus 2 by RT PCR (hospital order, performed in [facility] hospital lab) Nasopharyngeal Nasopharyngeal Swab     Status: None   Collection Time: 09/12/2020 11:36 PM   Specimen: Nasopharyngeal Swab  Result Value Ref Range Status   SARS Coronavirus 2 NEGATIVE NEGATIVE Final    Comment: (NOTE) SARS-CoV-2 target nucleic acids are NOT DETECTED.  The SARS-CoV-2 RNA is generally detectable in upper and lower respiratory specimens during the acute phase of infection. The lowest concentration of SARS-CoV-2 viral copies this assay can detect is 250 copies / mL. A negative result does not preclude SARS-CoV-2 infection and should not be used as the sole basis for treatment or other patient management decisions.  A negative result may occur with improper specimen collection / handling, submission of specimen other than nasopharyngeal swab, presence of viral mutation(s) within the areas targeted by this assay, and inadequate number of viral copies (<250 copies / mL). A negative result must be combined with clinical observations, patient history, and epidemiological information.  Fact Sheet for Patients:   StrictlyIdeas.no  Fact Sheet for Healthcare Providers: BankingDealers.co.za  This test is not yet approved or  cleared by the [place] FDA and has been authorized for detection and/or  diagnosis of SARS-CoV-2 by FDA under an Emergency Use Authorization (EUA).  This EUA will remain in effect (meaning this test can be used) for the duration of the COVID-19 declaration under Section 564(b)(1) of the Act, 21 U.S.C. section 360bbb-3(b)(1), unless the authorization is terminated or revoked sooner.  Performed at [facility], [place] [address] [place], [place] [address]       Radiology Studies: DG Chest Port 1 View  Result Date: 10/09/2020 CLINICAL DATA:  Sepsis, progressive hypoxia EXAM: PORTABLE CHEST 1 VIEW COMPARISON:  07/25/2019 FINDINGS: The lungs are symmetrically hyperinflated in keeping with changes of underlying COPD. There has developed extensive airspace infiltrate within the right lung base, likely infectious in the appropriate clinical setting. No pneumothorax or pleural effusion. Cardiac size within normal limits. Multiple healed right rib fractures are noted. T12 vertebroplasty has been performed. IMPRESSION: Extensive right basilar infiltrate, likely infectious in the acute setting. COPD Electronically Signed   By: Fidela Salisbury MD   On: 09/27/2020 23:48    Scheduled Meds: . antiseptic oral rinse  15 mL Topical BID  . glycopyrrolate  0.2 mg Intravenous Q6H  .  HYDROmorphone (DILAUDID) injection  0.5 mg Intravenous Q4H   Continuous Infusions:   LOS: 1 day   Time spent: 28 minutes   Darliss Cheney, MD [facility]  2020/10/24, 1:28 PM   To contact the attending provider between 7A-7P or the covering provider during after hours 7P-7A, please log into the web site [email or web address].

## 2020-10-11 NOTE — Death Summary Note (Signed)
Death Summary  ERLIN GARDELLA VOH:[id number] DOB: [birth year] DOA: 05-Oct-2020  PCP: Gayland Curry, DO PCP/Office notified:   Admit date: 10-05-20 Date of Death: 2020/10/11  Final Diagnoses:  Principal Problem:   Sepsis ([place]) Active Problems:   HTN (hypertension)   Prostate cancer ([place])   Parkinson disease ([place])   Dementia with behavioral disturbance ([place])   CAP (community acquired pneumonia)   ARF (acute renal failure) ([place])   Hypernatremia    1. Severe sepsis due to aspiration pneumonia 2. Acute hypoxic respiratory failure secondary to aspiration pneumonia 3. Acute encephalopathy secondary to sepsis 4. Acute renal failure secondary to sepsis  History of present illness:  HPI: Jose Baldwin is a 87 y.o. male with known history of Parkinson's disease, dementia, hypertension, anemia, chronic kidney disease stage III was brought to the ER after patient was found to be increasingly short of breath and altered.  Per report patient was recently diagnosed with pneumonia and was given antibiotics.  ED Course: In the ER patient was febrile with temperature of 101.4 F and patient was hypoxic requiring nonrebreather chest x-ray shows infiltrates consistent with pneumonia Covid test was negative.  Labs are significant for acute renal failure with creatinine increasing from 1.58 is around 3.2 sodium is 158 WBC 15.9 lactic acid 2.9 hemoglobin 11.2.  Patient had features consistent with sepsis was started on empiric antibiotics and fluids after blood cultures obtained.  Patient appears to be encephalopathic and not able to communicate.  Hospital Course:  Patient was admitted under hospitalist service with multiple diagnoses including severe sepsis, acute toxic encephalopathy and acute hypoxic respiratory failure secondary to aspiration pneumonia.  He was started on appropriate broad-spectrum IV antibiotics and required BiPAP support in the beginning.  Patient was very sick and was already living at  a nursing home.  Due to poor prognosis, I discussed with patient's son and DRL, they had agreed to transition to comfort care.  Palliative care was consulted as well however patient was too sick to be transferred to hospice facility.  Subsequently patient passed away on 2020/10/07 at approximately 5 PM.   Time: 15 minutes  Signed:  Darliss Cheney  [facility] 10/11/2020, 10:55 AM

## 2020-10-11 NOTE — Progress Notes (Signed)
Nutrition Brief Note  Chart reviewed. Pt now transitioning to comfort care.  No further nutrition interventions warranted at this time.  Please re-consult as needed.   Albertina Leise W, RD, LDN, CDCES Registered Dietitian II Certified Diabetes Care and Education Specialist Please refer to [facility] for RD and/or RD on-call/weekend/after hours pager  

## 2020-10-11 NOTE — Progress Notes (Signed)
Daily Progress Note   Patient Name: Jose Baldwin       Date: 09/23/2020 DOB: 12/01/1928  Age: 87 y.o. MRN#: [id number] Attending Physician: Darliss Cheney, MD Primary Care Physician: Gayland Curry, DO Admit Date: 10-18-20  Reason for Consultation/Follow-up: Non pain symptom management, Pain control, Psychosocial/spiritual support and Terminal Care  Subjective: Chart review performed. Received report from primary RN - no acute concerns.  Went to visit patient at bedside - son/Daren was present. Patient was lying in bed - he does not wake to voice or gentle touch. No signs or non-verbal gestures of pain or discomfort noted. No respiratory distress, increased work of breathing, or secretions noted. Patient remains on 3L [place].   Emotional support was provided to [place]. Therapeutic listening was provided as Daren reflected on the patient's life and special memories they had together. 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. Daren learned many skills from his father and also became an Chief Financial Officer himself. Daren states the patient had a "great life" and loved to work with people.   I expressed to Daren that I felt the patient looked much more comfortable today than when I saw him yesterday - he agreed that symptoms were much improved today. We discussed now that symptoms were better managed, weaning oxygen to room air. We reviewed oxygen use in context of EOL and that it can cause more discomfort than comfort - Daren was agreeable to remove oxygen during my visit - patient was placed on RA. Prognostication was discussed per Daren's request - explained I would not be surprised if the patient passed away within the next 24 hours.   DIL/[name] arrived toward the  end of visit - we reviewed expectations at EOL (including specifically that as Mr. Colborn approaches EOL it is expected and normal that he is more sleepy - explained that his current state is expected and normal). Prognostication was again reviewed per her request.    Daren and [name] state they are at peace with the current situation and expresses appreciation for visit today.   All questions and concerns addressed. Encouraged to call with questions and/or concerns. PMT card previously provided.  Length of Stay: 1  Current Medications: Scheduled Meds:  . antiseptic oral rinse  15 mL Topical BID  .  glycopyrrolate  0.2 mg Intravenous Q4H  .  HYDROmorphone (DILAUDID) injection  0.5 mg Intravenous Q4H    Continuous Infusions:   PRN Meds: acetaminophen **OR** acetaminophen, glycopyrrolate **OR** glycopyrrolate, haloperidol **OR** haloperidol lactate, HYDROmorphone (DILAUDID) injection, LORazepam **OR** LORazepam, ondansetron **OR** ondansetron (ZOFRAN) IV, polyvinyl alcohol  Physical Exam Vitals and nursing note reviewed.  Constitutional:      General: He is not in acute distress.    Appearance: He is ill-appearing.  Pulmonary:     Effort: No respiratory distress.  Skin:    General: Skin is cool and dry.  Neurological:     Mental Status: He is unresponsive.     Motor: Weakness present.  Psychiatric:        Speech: He is noncommunicative.             Vital Signs: BP (!) 99/55 (BP Location: Right Arm)   Pulse 74   Temp 98.2 F (36.8 C) (Oral)   Resp 13   Ht 6\' 1"  (1.854 m)   Wt 54 kg   SpO2 (!) 86%   BMI 15.71 kg/m  SpO2: SpO2: (!) 86 % O2 Device: O2 Device: Nasal Cannula O2 Flow Rate: O2 Flow Rate (L/min): 15 L/min  Intake/output summary:   Intake/Output Summary (Last 24 hours) at Oct 27, 2020 0855 Last data filed at 10/05/2020 1416 Gross per 24 hour  Intake 1029.07 ml  Output --  Net 1029.07 ml   LBM:   Baseline Weight: Weight: 54 kg Most recent weight: Weight: 54  kg       Palliative Assessment/Data: PPS 10%    Flowsheet Rows   Flowsheet Row Most Recent Value  Intake Tab   Referral Department Hospitalist  Unit at Time of Referral ER  Palliative Care Primary Diagnosis Sepsis/Infectious Disease  Date Notified 10/05/20  Palliative Care Type New Palliative care  Reason for referral Clarify Goals of Care, End of Life Care Assistance  Date of Admission 09/23/2020  Date first seen by Palliative Care 10/05/20  # of days Palliative referral response time 0 Day(s)  # of days IP prior to Palliative referral 1  Clinical Assessment   Psychosocial & Spiritual Assessment   Palliative Care Outcomes   Patient/Family meeting held? Yes  Who was at the meeting? patient, son, DIL  Palliative Care Outcomes Improved pain interventions, Improved non-pain symptom therapy, Clarified goals of care, Counseled regarding hospice, Provided end of life care assistance, Provided psychosocial or spiritual support  Patient/Family wishes: Interventions discontinued/not started  Mechanical Ventilation, Antibiotics, BiPAP, Tube feedings/TPN, Hemodialysis, NIPPV, Transfusion, Trach, Vasopressors, PEG, Transfer out of ICU      Patient Active Problem List   Diagnosis Date Noted  . Sepsis ([place]) 10/05/2020  . CAP (community acquired pneumonia) 10/05/2020  . ARF (acute renal failure) ([place]) 10/05/2020  . Hypernatremia 10/05/2020  . Severe anemia 09/01/2019  . Dementia with behavioral disturbance ([place]) 05/30/2019  . Hyperlipidemia 05/30/2019  . Bradycardia 05/22/2019  . Goals of care, counseling/discussion   . Palliative care by specialist   . Normocytic anemia 05/21/2019  . CKD (chronic kidney disease), stage III ([place]) 05/21/2019  . Physical deconditioning 05/21/2019  . Hematuria 05/21/2019  . Hypokalemia 04/20/2019  . Hypomagnesemia 04/20/2019  . Parkinson disease ([place]) 04/20/2019  . Cardiac arrhythmia 04/20/2019  . Protein-calorie malnutrition, severe 03/31/2019  .  Cerebral embolism with cerebral infarction 03/30/2019  . POTS (postural orthostatic tachycardia syndrome) 03/28/2019  . HTN (hypertension) 02/04/2018  . Prostate cancer ([place]) 02/04/2018  . Iron deficiency anemia 02/04/2018  .  Syncope 02/02/2018    Palliative Care Assessment & Plan   Patient Profile: 87 y.o. male  with past medical history of Parkinson's disease, dementia, HTN, CKD, prostate cancer presented to the ED from [facility] with shortness of breath. Patient was admitted on 09/19/2020 with sepsis secondary to pneumonia, acute encephalopathy, and acute renal failure. While in ED patient continued to decline and family decided to initiate full comfort measures.  ED Course:In the ER patient was febrile with temperature of 101.4 F and patient was hypoxic requiring nonrebreather chest x-ray shows infiltrates consistent with pneumonia Covid test was negative. Labs are significant for acute renal failure with creatinine increasing from 1.58 is around 3.2 sodium is 158 WBC 15.9 lactic acid 2.9 hemoglobin 11.2. Patient had features consistent with sepsis was started on empiric antibiotics and fluids after blood cultures obtained. Patient appears to be encephalopathic and not able to communicate.  Assessment: Severe sepsis Acute hypoxic respiratory failure Acute metabolic encephalopathy Terminal care  Recommendations/Plan:  Continue full comfort measures  Continue DNR/DNI as previously documented  Patient will be hospital death - anticipate patient will pass within the next 24 hours  Patient weaned to room air - do not escalate - provide symptom management per "oxygen therapy" order  Decreased scheduled robinul from q4h to q6h - terminal secretions much improved from yesterday   Provide frequent assessments and administer PRN medications as clinically necessary to ensure EOL comfort  PMT will continue to follow holistically    Goals of Care and Additional  Recommendations:  Limitations on Scope of Treatment: Full Comfort Care  Code Status:    Code Status Orders  (From admission, onward)         Start     Ordered   10/05/20 1642  Do not attempt resuscitation (DNR)  Continuous       Question Answer Comment  In the event of cardiac or respiratory ARREST Do not call a "code blue"   In the event of cardiac or respiratory ARREST Do not perform Intubation, CPR, defibrillation or ACLS   In the event of cardiac or respiratory ARREST Use medication by any route, position, wound care, and other measures to relive pain and suffering. May use oxygen, suction and manual treatment of airway obstruction as needed for comfort.   Comments Has Filled out DNR form in [facility]      10/05/20 1655        Code Status History    Date Active Date Inactive Code Status Order ID Comments User Context   10/05/2020 0314 10/05/2020 1655 DNR [id number]  Eduard Clos, MD ED   10/05/2020 0118 10/05/2020 0313 DNR [id number]  Eduard Clos, MD ED   06/14/2020 1017 06/30/2020 1513 DNR [id number] Document in Epic Ngetich, Donalee Citrin, NP Outpatient   01/27/2020 0812 03/08/2020 2156 DNR [id number] Per [facility] chart at admission Kermit Balo, DO Outpatient   09/01/2019 2329 09/03/2019 2146 DNR [id number]  Lurene Shadow, MD ED   05/20/2019 2201 05/25/2019 1955 DNR [id number]  John Giovanni, MD ED   05/20/2019 2141 05/20/2019 2200 Full Code [id number]  John Giovanni, MD ED   04/23/2019 0936 04/23/2019 1843 DNR [id number]  Glade Lloyd, MD Inpatient   04/20/2019 2239 04/23/2019 0936 Full Code [id number]  Briscoe Deutscher, MD Inpatient   03/28/2019 1855 04/01/2019 1457 Full Code [id number]  Rhetta Mura, MD Inpatient   02/02/2018 1626 02/06/2018 1853 Full Code [id number]  Arnetha Courser, MD ED   Advance Care Planning Activity  Advance Directive Documentation   Flowsheet Row Most Recent Value  Type of Advance Directive Out of facility DNR (pink MOST or yellow form)   Pre-existing out of facility DNR order (yellow form or pink MOST form) --  "MOST" Form in Place? --       Prognosis:   Likely within the next 24 hours  Discharge Planning:  Anticipated Hospital Death  Care plan was discussed with primary RN, son/Daren, DIL/[name]  Thank you for allowing the Palliative Medicine Team to assist in the care of this patient.   Total Time 38 minutes Prolonged Time Billed  no       Greater than 50%  of this time was spent counseling and coordinating care related to the above assessment and plan.  Lin Landsman, NP  Please contact Palliative Medicine Team phone at [phone] for questions and concerns.

## 2020-10-11 NOTE — Plan of Care (Signed)
Patient arrived from ED, settled into room comfortably. Son remains at bedside. Will continue to provide care as per orders/care plan.

## 2020-10-11 DEATH — deceased

## 2020-11-14 IMAGING — CT CT HEAD W/O CM
3 of 4 series · 15 of 47 positions shown, 18 images · non-contrast
Comparison: Head CT 06/01/2019

CLINICAL DATA: Dementia patient post fall this evening. Laceration
to bridge of nose.

EXAM:
CT HEAD WITHOUT CONTRAST
TECHNIQUE: Contiguous axial images were obtained from the base of the skull
through the vertex without intravenous contrast.

[Series 4: head 2.0 h70h · axial · 0.49mm/px · z∈[-298,-170]mm · 9 of 80 slices shown, 12 images]
[im 8/80  brain]
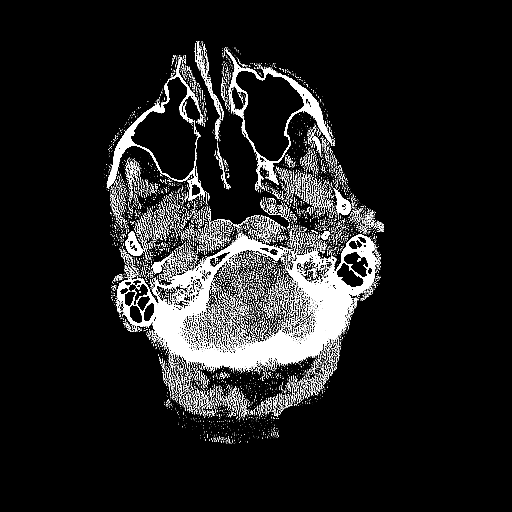
[im 8/80  bone]
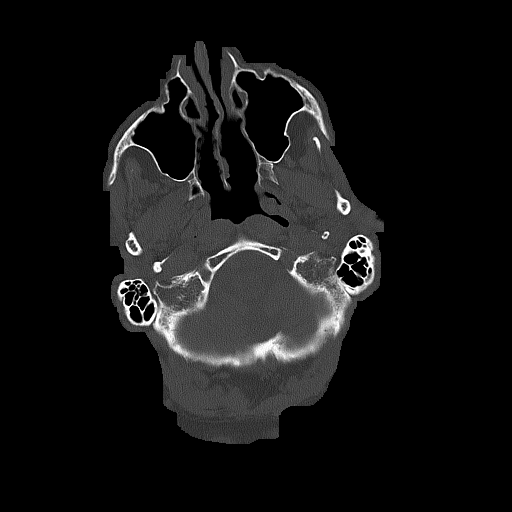
[im 16/80  brain]
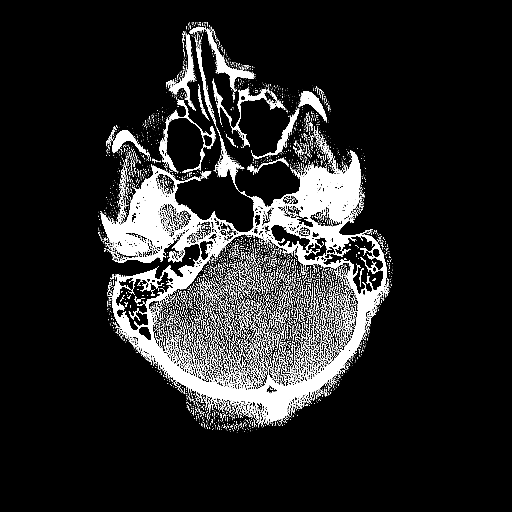
[im 24/80  brain]
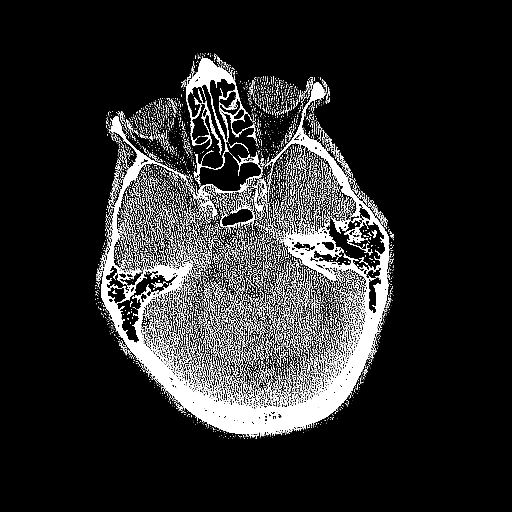
[im 32/80  brain]
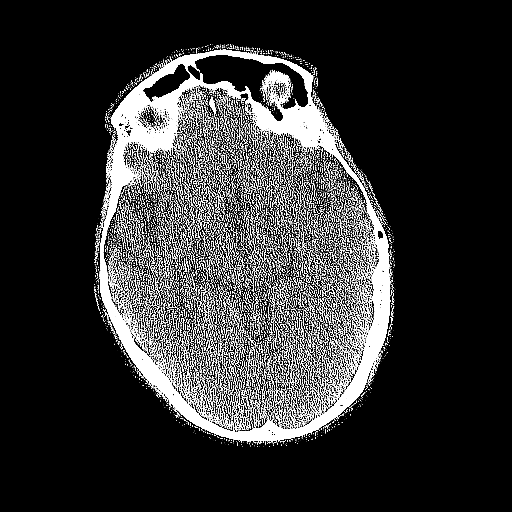
[im 40/80  brain]
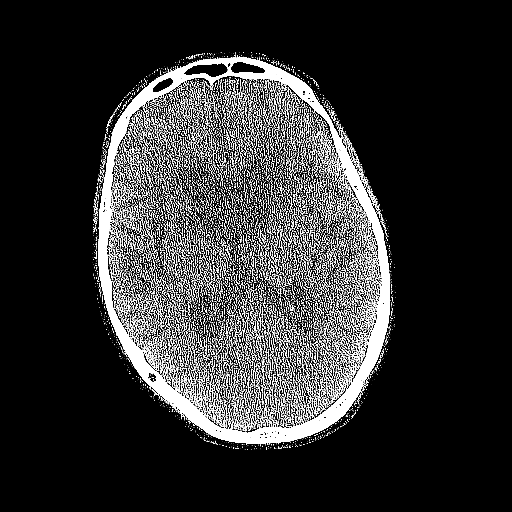
[im 40/80  bone]
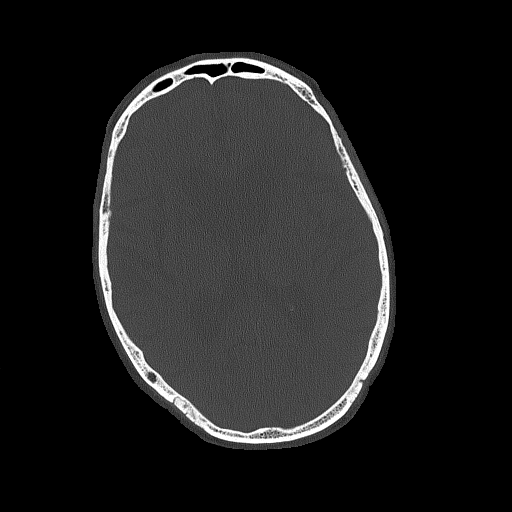
[im 48/80  brain]
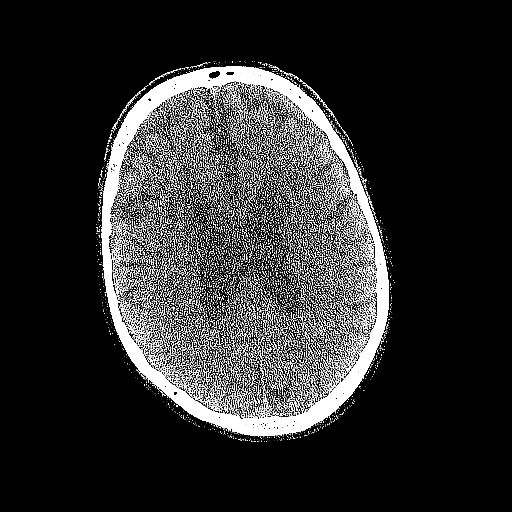
[im 56/80  brain]
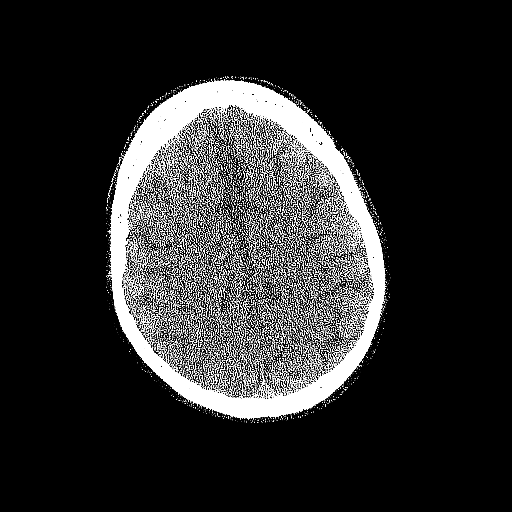
[im 64/80  brain]
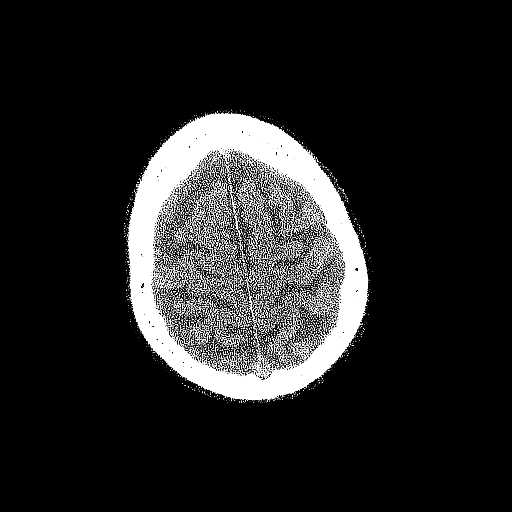
[im 72/80  brain]
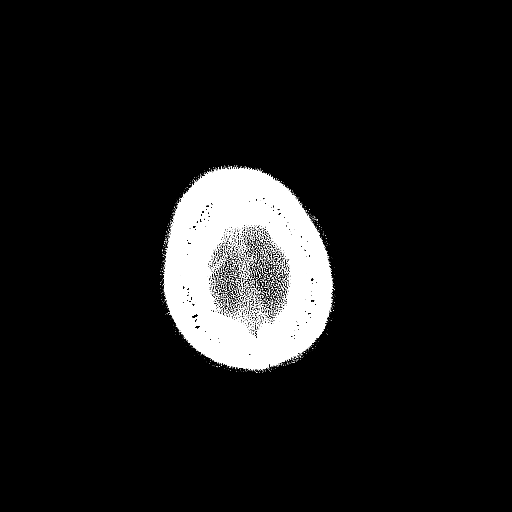
[im 72/80  bone]
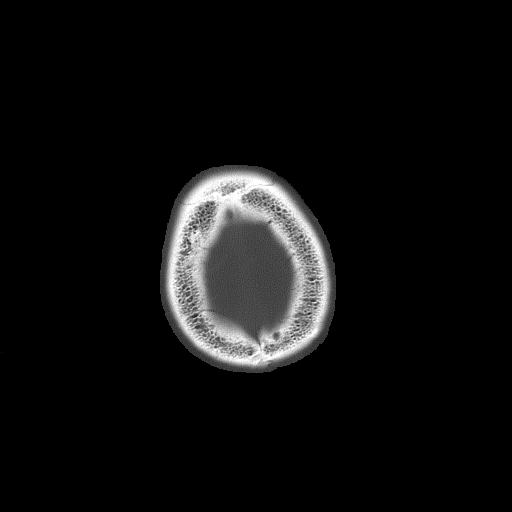

[Series 5: head 3.0 mpr cor · coronal · 0.33mm/px · 3 of 73 slices shown]
[im 25/73  brain]
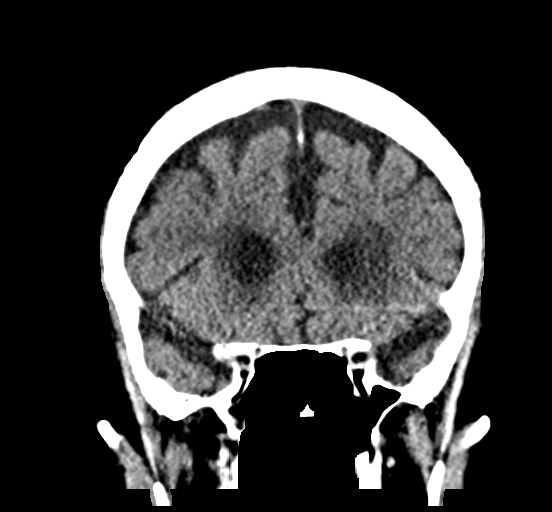
[im 33/73  brain]
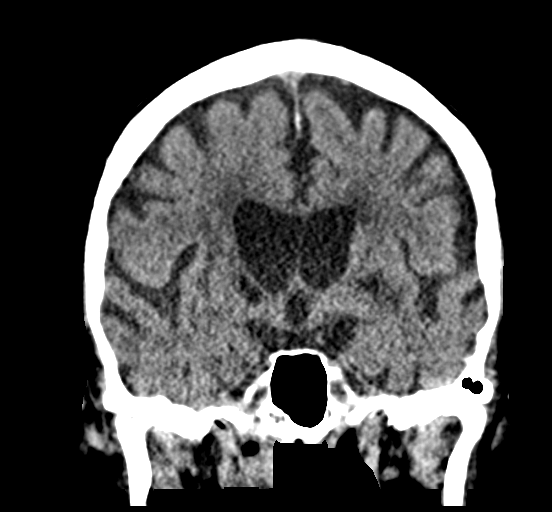
[im 41/73  brain]
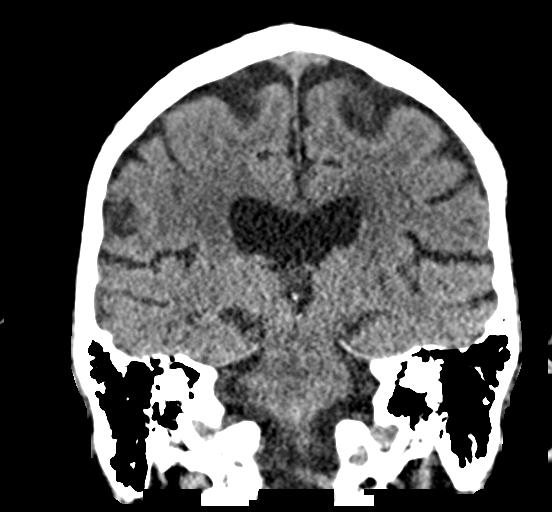

[Series 6: head 3.0 mpr sag · sagittal · 0.31mm/px · 3 of 66 slices shown]
[im 22/66  brain]
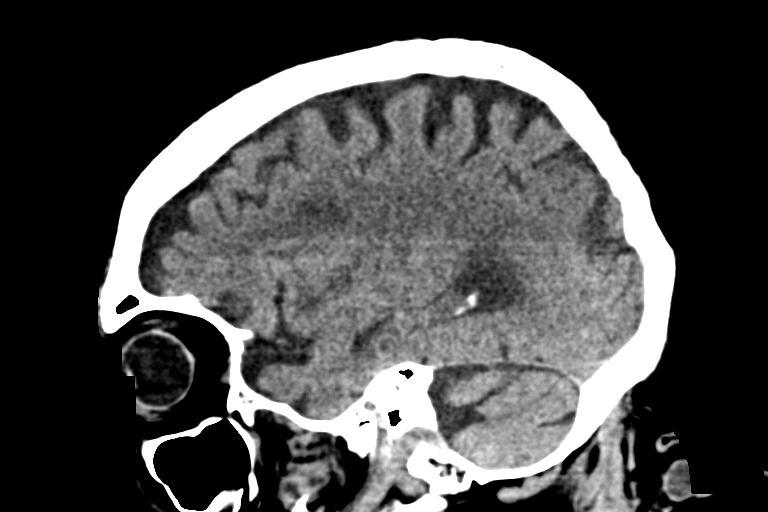
[im 33/66  brain]
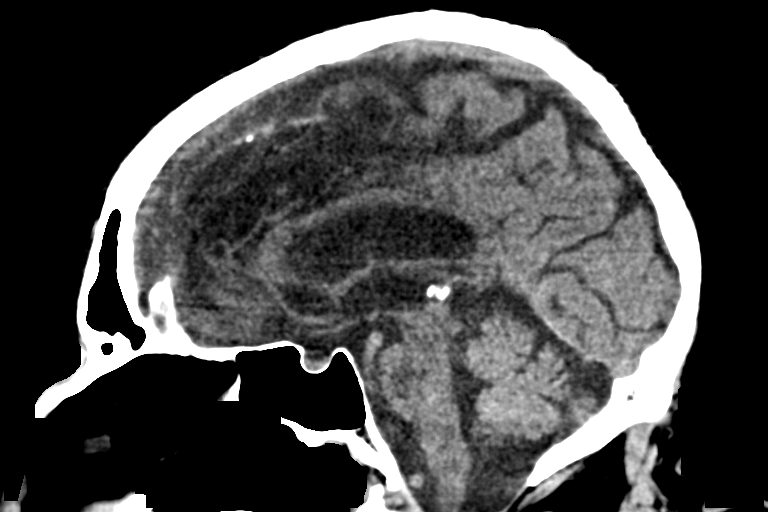
[im 44/66  brain]
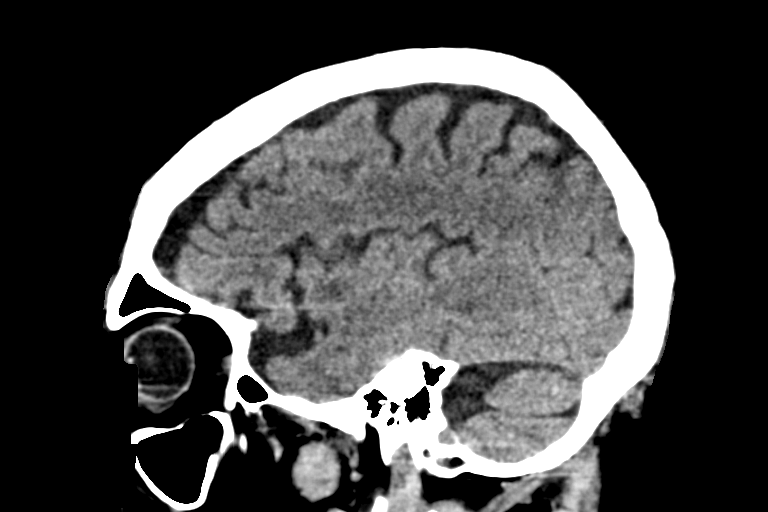

[15 of 47 positions shown; findings below may reference images not displayed]

FINDINGS: Brain: No acute intracranial hemorrhage. Generalized atrophy with
questionable progression from prior. Moderate to advanced chronic
small vessel ischemia. Remote lacunar infarcts in the bilateral
basal ganglia, bilateral thalami and right caudate. No subdural or
extra-axial collection. No midline shift or hydrocephalus. No
evidence of acute ischemia.

Vascular: Atherosclerosis of skullbase vasculature without
hyperdense vessel or abnormal calcification.

Skull: No fracture or focal lesion.

Sinuses/Orbits: Paranasal sinuses and mastoid air cells are clear.
The visualized orbits are unremarkable. No evidence of nasal bone
fracture. Remote right maxillary sinus fracture. Bilateral cataract
resection.

Other: None.
IMPRESSION: 1. No acute intracranial abnormality. No skull fracture.
2. Generalized atrophy and chronic small vessel ischemia. Multiple
remote lacunar infarcts.
